# Patient Record
Sex: Male | Born: 1960 | Race: White | Hispanic: No | Marital: Married | Smoking: Never smoker
Health system: Southern US, Community
[De-identification: ages and names within clinical notes are randomized; demographics above are authoritative.]

## PROBLEM LIST (undated history)

## (undated) DIAGNOSIS — T8859XA Other complications of anesthesia, initial encounter: Secondary | ICD-10-CM

## (undated) DIAGNOSIS — G2581 Restless legs syndrome: Secondary | ICD-10-CM

## (undated) DIAGNOSIS — I10 Essential (primary) hypertension: Secondary | ICD-10-CM

## (undated) DIAGNOSIS — G4730 Sleep apnea, unspecified: Secondary | ICD-10-CM

## (undated) DIAGNOSIS — E785 Hyperlipidemia, unspecified: Secondary | ICD-10-CM

## (undated) DIAGNOSIS — N1330 Unspecified hydronephrosis: Secondary | ICD-10-CM

## (undated) DIAGNOSIS — K760 Fatty (change of) liver, not elsewhere classified: Secondary | ICD-10-CM

## (undated) DIAGNOSIS — K219 Gastro-esophageal reflux disease without esophagitis: Secondary | ICD-10-CM

## (undated) DIAGNOSIS — M1990 Unspecified osteoarthritis, unspecified site: Secondary | ICD-10-CM

## (undated) DIAGNOSIS — M549 Dorsalgia, unspecified: Secondary | ICD-10-CM

## (undated) DIAGNOSIS — N400 Enlarged prostate without lower urinary tract symptoms: Secondary | ICD-10-CM

## (undated) DIAGNOSIS — M2550 Pain in unspecified joint: Secondary | ICD-10-CM

## (undated) DIAGNOSIS — T7840XA Allergy, unspecified, initial encounter: Secondary | ICD-10-CM

## (undated) DIAGNOSIS — G4733 Obstructive sleep apnea (adult) (pediatric): Secondary | ICD-10-CM

## (undated) HISTORY — DX: Fatty (change of) liver, not elsewhere classified: K76.0

## (undated) HISTORY — DX: Allergy, unspecified, initial encounter: T78.40XA

## (undated) HISTORY — DX: Pain in unspecified joint: M25.50

## (undated) HISTORY — PX: JOINT REPLACEMENT: SHX530

## (undated) HISTORY — DX: Dorsalgia, unspecified: M54.9

---

## 2009-06-17 ENCOUNTER — Emergency Department (HOSPITAL_BASED_OUTPATIENT_CLINIC_OR_DEPARTMENT_OTHER): Admission: EM | Admit: 2009-06-17 | Discharge: 2009-06-17 | Payer: Self-pay | Admitting: Emergency Medicine

## 2010-04-28 HISTORY — PX: WISDOM TOOTH EXTRACTION: SHX21

## 2013-04-04 ENCOUNTER — Encounter: Payer: Self-pay | Admitting: Sports Medicine

## 2013-04-04 ENCOUNTER — Ambulatory Visit (INDEPENDENT_AMBULATORY_CARE_PROVIDER_SITE_OTHER): Payer: 59 | Admitting: Sports Medicine

## 2013-04-04 VITALS — BP 145/92 | HR 86 | Wt 230.0 lb

## 2013-04-04 DIAGNOSIS — M25561 Pain in right knee: Secondary | ICD-10-CM

## 2013-04-04 DIAGNOSIS — I10 Essential (primary) hypertension: Secondary | ICD-10-CM | POA: Insufficient documentation

## 2013-04-04 DIAGNOSIS — Z0000 Encounter for general adult medical examination without abnormal findings: Secondary | ICD-10-CM | POA: Insufficient documentation

## 2013-04-04 DIAGNOSIS — L918 Other hypertrophic disorders of the skin: Secondary | ICD-10-CM | POA: Insufficient documentation

## 2013-04-04 DIAGNOSIS — S83206A Unspecified tear of unspecified meniscus, current injury, right knee, initial encounter: Secondary | ICD-10-CM | POA: Insufficient documentation

## 2013-04-04 DIAGNOSIS — M5030 Other cervical disc degeneration, unspecified cervical region: Secondary | ICD-10-CM

## 2013-04-04 DIAGNOSIS — M1711 Unilateral primary osteoarthritis, right knee: Secondary | ICD-10-CM | POA: Insufficient documentation

## 2013-04-04 DIAGNOSIS — M51379 Other intervertebral disc degeneration, lumbosacral region without mention of lumbar back pain or lower extremity pain: Secondary | ICD-10-CM

## 2013-04-04 DIAGNOSIS — M5137 Other intervertebral disc degeneration, lumbosacral region: Secondary | ICD-10-CM

## 2013-04-04 DIAGNOSIS — M170 Bilateral primary osteoarthritis of knee: Secondary | ICD-10-CM | POA: Insufficient documentation

## 2013-04-04 DIAGNOSIS — M51369 Other intervertebral disc degeneration, lumbar region without mention of lumbar back pain or lower extremity pain: Secondary | ICD-10-CM | POA: Insufficient documentation

## 2013-04-04 DIAGNOSIS — E785 Hyperlipidemia, unspecified: Secondary | ICD-10-CM

## 2013-04-04 DIAGNOSIS — Z299 Encounter for prophylactic measures, unspecified: Secondary | ICD-10-CM

## 2013-04-04 DIAGNOSIS — M25569 Pain in unspecified knee: Secondary | ICD-10-CM

## 2013-04-04 DIAGNOSIS — M5136 Other intervertebral disc degeneration, lumbar region: Secondary | ICD-10-CM | POA: Insufficient documentation

## 2013-04-04 DIAGNOSIS — R030 Elevated blood-pressure reading, without diagnosis of hypertension: Secondary | ICD-10-CM

## 2013-04-04 DIAGNOSIS — L909 Atrophic disorder of skin, unspecified: Secondary | ICD-10-CM

## 2013-04-04 NOTE — Progress Notes (Signed)
  Subjective:    CC: Establish care.   HPI:  This is a 52 year old male who comes in with multiple complaints, he is transferring care from [facility].  Neck pain: Persistent, has been seeing a chiropractor, pain is been present for years, radiates from the neck down both arms in a C7 versus C8 distribution. He has had x-rays which showed degenerative disc disease in the past, but has never had an MRI. He has had some block scan which was moderately effective. Pain is moderate, persistent.  Low back pain: Amenable to discussing this in further detail at a future visit.  Hyperlipidemia: Tells me his lipids have been elevated in the past but he does not know the exact numbers, he is having records transferred.  Elevated blood pressure: Has probably been elevated on multiple occasions in the past but he is in pain right now.  Right knee pain: Worse when going up and down stairs, pain is at the joint lines as well as under the kneecap, moderate, persistent.  Skin tag: Located in the perineum, desires to have this excised.  Preventative measure: Colonoscopy 2 years ago, multiple polyps were removed, the pathology on these is unknown, he was told to repeat his colonoscopy in 2 years desires to go to a new gastroenterologist. Tetanus shot was 8 years ago, flu shot last month.  Past medical history, Surgical history, Family history not pertinant except as noted below, Social history, Allergies, and medications have been entered into the medical record, reviewed, and no changes needed.   Review of Systems: No headache, visual changes, nausea, vomiting, diarrhea, constipation, dizziness, abdominal pain, skin rash, fevers, chills, night sweats, swollen lymph nodes, weight loss, chest pain, body aches, joint swelling, muscle aches, shortness of breath, mood changes, visual or auditory hallucinations.  Objective:    General: Well Developed, well nourished, and in no acute distress.  Neuro:  Alert and oriented x3, extra-ocular muscles intact, sensation grossly intact.  HEENT: Normocephalic, atraumatic, pupils equal round reactive to light, neck supple, no masses, no lymphadenopathy, thyroid nonpalpable.  Skin: Warm and dry, no rashes noted.  Cardiac: Regular rate and rhythm, no murmurs rubs or gallops.  Respiratory: Clear to auscultation bilaterally. Not using accessory muscles, speaking in full sentences.  Abdominal: Soft, nontender, nondistended, positive bowel sounds, no masses, no organomegaly.  Neck: Inspection unremarkable. No palpable stepoffs. Negative Spurling's maneuver. Full neck range of motion Grip strength and sensation normal in bilateral hands Strength good C4 to T1 distribution No sensory change to C4 to T1 Negative Hoffman sign bilaterally Reflexes normal Right Knee: Normal to inspection with no erythema or effusion or obvious bony abnormalities. Tender to palpation along the medial joint line. All ligaments are stable including ACL, PCL, LCL, MCL. Negative Mcmurray's, Apley's, and Thessalonian tests. Non painful patellar compression. Patellar glide without crepitus. Patellar and quadriceps tendons unremarkable. Hamstring and quadriceps strength is normal.   Impression and Recommendations:    The patient was counselled, risk factors were discussed, anticipatory guidance given.

## 2013-04-04 NOTE — Assessment & Plan Note (Signed)
We will address this further at a future visit.

## 2013-04-04 NOTE — Assessment & Plan Note (Signed)
Obtaining records including blood work, it sounds as though he did have some hepatic steatosis.

## 2013-04-04 NOTE — Assessment & Plan Note (Signed)
Likely represents osteoarthritis. We will obtain records with x-rays. Continue Mobic, home rehabilitation given.

## 2013-04-04 NOTE — Assessment & Plan Note (Addendum)
Due for colonoscopy, he did have some polyps. Tetanus shot 8 years ago, flu shot one month ago. Tells me he had routine blood work done recently, and we'll try to get the lab results for me. He also has some fatigue, we can consider checking his testosterone levels in the near future.

## 2013-04-04 NOTE — Assessment & Plan Note (Signed)
Schedule appointment for excision.

## 2013-04-04 NOTE — Assessment & Plan Note (Signed)
In pain, we will follow this.

## 2013-04-04 NOTE — Assessment & Plan Note (Signed)
Obtaining records including x-rays which he has had already. Symptoms are suggestive of cervical spinal stenosis. Continue Mobic, home rehabilitation, obtaining an MRI. He does desire to continue with conservative measures, I do think interventions will be in the future.

## 2013-04-05 ENCOUNTER — Telehealth: Payer: Self-pay | Admitting: *Deleted

## 2013-04-05 NOTE — Telephone Encounter (Signed)
PA obtained for MRI Cervical Spine w/o contrast. Auth # [id number].  Meyer Cory, LPN

## 2013-04-09 ENCOUNTER — Ambulatory Visit (HOSPITAL_BASED_OUTPATIENT_CLINIC_OR_DEPARTMENT_OTHER)
Admission: RE | Admit: 2013-04-09 | Discharge: 2013-04-09 | Disposition: A | Discharge status: Home / Self Care | Payer: 59 | Source: Ambulatory Visit | Attending: Sports Medicine | Admitting: Sports Medicine

## 2013-04-09 DIAGNOSIS — M47812 Spondylosis without myelopathy or radiculopathy, cervical region: Secondary | ICD-10-CM | POA: Insufficient documentation

## 2013-04-09 DIAGNOSIS — M5020 Other cervical disc displacement, unspecified cervical region: Secondary | ICD-10-CM | POA: Insufficient documentation

## 2013-04-09 DIAGNOSIS — M4802 Spinal stenosis, cervical region: Secondary | ICD-10-CM | POA: Insufficient documentation

## 2013-04-09 DIAGNOSIS — R209 Unspecified disturbances of skin sensation: Secondary | ICD-10-CM | POA: Insufficient documentation

## 2013-04-09 DIAGNOSIS — M542 Cervicalgia: Secondary | ICD-10-CM | POA: Insufficient documentation

## 2013-04-09 DIAGNOSIS — M5030 Other cervical disc degeneration, unspecified cervical region: Secondary | ICD-10-CM

## 2013-04-09 DIAGNOSIS — G8929 Other chronic pain: Secondary | ICD-10-CM | POA: Insufficient documentation

## 2013-04-11 ENCOUNTER — Encounter: Payer: Self-pay | Admitting: Sports Medicine

## 2013-04-11 ENCOUNTER — Ambulatory Visit (INDEPENDENT_AMBULATORY_CARE_PROVIDER_SITE_OTHER): Payer: 59 | Admitting: Sports Medicine

## 2013-04-11 VITALS — BP 150/89 | HR 77 | Wt 231.0 lb

## 2013-04-11 DIAGNOSIS — L909 Atrophic disorder of skin, unspecified: Secondary | ICD-10-CM

## 2013-04-11 DIAGNOSIS — L918 Other hypertrophic disorders of the skin: Secondary | ICD-10-CM

## 2013-04-11 NOTE — Assessment & Plan Note (Signed)
8 skin tags removed from the perineum, as well as both axillae. Return as needed for this.

## 2013-04-11 NOTE — Progress Notes (Signed)
  Procedure: Removal of 8 skin tags.  Risks, benefits, and alternatives explained and consent obtained. Time out conducted. Surface prepped in a sterile fashion. 1cc lidocaine with epinephine infiltrated under skin tag Adequate anesthesia ensured. Skin tag excised from skin surface. This procedure was repeated for all 8 skin tags. Dressing applied. Pt stable. Pt advised to call or RTC for continued bleeding, spreading erythema/induration, fevers, or chills.

## 2013-05-02 ENCOUNTER — Ambulatory Visit (INDEPENDENT_AMBULATORY_CARE_PROVIDER_SITE_OTHER): Payer: 59 | Admitting: Sports Medicine

## 2013-05-02 ENCOUNTER — Encounter: Payer: Self-pay | Admitting: Sports Medicine

## 2013-05-02 VITALS — BP 138/84 | HR 92 | Wt 234.0 lb

## 2013-05-02 DIAGNOSIS — M51379 Other intervertebral disc degeneration, lumbosacral region without mention of lumbar back pain or lower extremity pain: Secondary | ICD-10-CM

## 2013-05-02 DIAGNOSIS — R030 Elevated blood-pressure reading, without diagnosis of hypertension: Secondary | ICD-10-CM

## 2013-05-02 DIAGNOSIS — L919 Hypertrophic disorder of the skin, unspecified: Secondary | ICD-10-CM

## 2013-05-02 DIAGNOSIS — M25569 Pain in unspecified knee: Secondary | ICD-10-CM

## 2013-05-02 DIAGNOSIS — M5136 Other intervertebral disc degeneration, lumbar region: Secondary | ICD-10-CM

## 2013-05-02 DIAGNOSIS — M51369 Other intervertebral disc degeneration, lumbar region without mention of lumbar back pain or lower extremity pain: Secondary | ICD-10-CM

## 2013-05-02 DIAGNOSIS — E669 Obesity, unspecified: Secondary | ICD-10-CM | POA: Insufficient documentation

## 2013-05-02 DIAGNOSIS — M5137 Other intervertebral disc degeneration, lumbosacral region: Secondary | ICD-10-CM

## 2013-05-02 DIAGNOSIS — L918 Other hypertrophic disorders of the skin: Secondary | ICD-10-CM

## 2013-05-02 DIAGNOSIS — L909 Atrophic disorder of skin, unspecified: Secondary | ICD-10-CM

## 2013-05-02 DIAGNOSIS — M25561 Pain in right knee: Secondary | ICD-10-CM

## 2013-05-02 DIAGNOSIS — M5030 Other cervical disc degeneration, unspecified cervical region: Secondary | ICD-10-CM

## 2013-05-02 MED ORDER — PHENTERMINE HCL 37.5 MG PO CAPS: 37.5000 mg | ORAL_CAPSULE | ORAL | Status: DC

## 2013-05-02 NOTE — Assessment & Plan Note (Signed)
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

## 2013-05-02 NOTE — Assessment & Plan Note (Signed)
Starting phentermine, nutritionist. Return monthly for weight checks.

## 2013-05-02 NOTE — Progress Notes (Signed)
  Subjective:    CC: Followup  HPI: Skin tags: Surgical sites are doing very well. He has some more tags on his neck which he is not yet ready to have removed.  Knee pain: Likely due to chondromalacia, feeling okay, more good days than bad days, he does desire to try to lose weight before pursuing any further intervention.  Cervical degenerative disc disease: MRI shows multilevel degenerative changes worse at the C5-6 and C6-C7 levels. Again, he desires to pursue weight loss before any further treatment here.  Lumbar degenerative disc disease: Same as above, desires to pursue weight loss before further intervention.  Past medical history, Surgical history, Family history not pertinant except as noted below, Social history, Allergies, and medications have been entered into the medical record, reviewed, and no changes needed.   Review of Systems: No fevers, chills, night sweats, weight loss, chest pain, or shortness of breath.   Objective:    General: Well Developed, well nourished, and in no acute distress.  Neuro: Alert and oriented x3, extra-ocular muscles intact, sensation grossly intact.  HEENT: Normocephalic, atraumatic, pupils equal round reactive to light, neck supple, no masses, no lymphadenopathy, thyroid nonpalpable.  Skin: Warm and dry, no rashes. Cardiac: Regular rate and rhythm, no murmurs rubs or gallops, no lower extremity edema.  Respiratory: Clear to auscultation bilaterally. Not using accessory muscles, speaking in full sentences.  Impression and Recommendations:

## 2013-05-02 NOTE — Assessment & Plan Note (Signed)
Extremely reasonable to try weight loss first prior to further treatment. 

## 2013-05-02 NOTE — Assessment & Plan Note (Signed)
Surgical sites are doing very well.

## 2013-05-02 NOTE — Assessment & Plan Note (Signed)
Improved, we will keep an eye on this.

## 2013-05-02 NOTE — Assessment & Plan Note (Signed)
Extremely reasonable to try weight loss first prior to further treatment.

## 2013-05-09 ENCOUNTER — Ambulatory Visit: Payer: Self-pay | Admitting: Sports Medicine

## 2013-05-30 ENCOUNTER — Encounter: Payer: Self-pay | Admitting: Sports Medicine

## 2013-05-30 ENCOUNTER — Ambulatory Visit (INDEPENDENT_AMBULATORY_CARE_PROVIDER_SITE_OTHER): Payer: 59 | Admitting: Sports Medicine

## 2013-05-30 VITALS — BP 117/63 | HR 88 | Wt 225.0 lb

## 2013-05-30 DIAGNOSIS — E669 Obesity, unspecified: Secondary | ICD-10-CM

## 2013-05-30 MED ORDER — PHENTERMINE HCL 37.5 MG PO CAPS: 37.5000 mg | ORAL_CAPSULE | ORAL | Status: DC

## 2013-05-30 NOTE — Progress Notes (Signed)
  Subjective:    CC: Weight check  HPI: Obesity: Is currently using phentermine every other day, has lost 10 pounds in the last month, goal weight is 200 pounds.  Past medical history, Surgical history, Family history not pertinant except as noted below, Social history, Allergies, and medications have been entered into the medical record, reviewed, and no changes needed.   Review of Systems: No fevers, chills, night sweats, weight loss, chest pain, or shortness of breath.   Objective:    General: Well Developed, well nourished, and in no acute distress.  Neuro: Alert and oriented x3, extra-ocular muscles intact, sensation grossly intact.  HEENT: Normocephalic, atraumatic, pupils equal round reactive to light, neck supple, no masses, no lymphadenopathy, thyroid nonpalpable.  Skin: Warm and dry, no rashes. Cardiac: Regular rate and rhythm, no murmurs rubs or gallops, no lower extremity edema.  Respiratory: Clear to auscultation bilaterally. Not using accessory muscles, speaking in full sentences.  Impression and Recommendations:

## 2013-05-30 NOTE — Assessment & Plan Note (Signed)
10 pound weight loss since the last visit. He is using phentermine every other day. Refilling. Return in one month. goal weight is 200 pounds.

## 2013-06-27 ENCOUNTER — Ambulatory Visit (INDEPENDENT_AMBULATORY_CARE_PROVIDER_SITE_OTHER): Payer: 59 | Admitting: Sports Medicine

## 2013-06-27 ENCOUNTER — Encounter: Payer: Self-pay | Admitting: Sports Medicine

## 2013-06-27 VITALS — BP 145/90 | HR 89 | Ht 67.5 in | Wt 218.0 lb

## 2013-06-27 DIAGNOSIS — E669 Obesity, unspecified: Secondary | ICD-10-CM

## 2013-06-27 MED ORDER — PHENTERMINE HCL 37.5 MG PO CAPS: 37.5000 mg | ORAL_CAPSULE | ORAL | Status: DC

## 2013-06-27 NOTE — Progress Notes (Signed)
  Subjective:    CC: Weight check  HPI: Obesity: Continues to lose weight with phentermine, he is now using it almost daily. No adverse effects.  Past medical history, Surgical history, Family history not pertinant except as noted below, Social history, Allergies, and medications have been entered into the medical record, reviewed, and no changes needed.   Review of Systems: No fevers, chills, night sweats, weight loss, chest pain, or shortness of breath.   Objective:    General: Well Developed, well nourished, and in no acute distress.  Neuro: Alert and oriented x3, extra-ocular muscles intact, sensation grossly intact.  HEENT: Normocephalic, atraumatic, pupils equal round reactive to light, neck supple, no masses, no lymphadenopathy, thyroid nonpalpable.  Skin: Warm and dry, no rashes. Cardiac: Regular rate and rhythm, no murmurs rubs or gallops, no lower extremity edema.  Respiratory: Clear to auscultation bilaterally. Not using accessory muscles, speaking in full sentences.  Impression and Recommendations:

## 2013-06-27 NOTE — Assessment & Plan Note (Signed)
Additional weight loss. Refilling phentermine. Return in 1 month.

## 2013-07-26 ENCOUNTER — Ambulatory Visit (INDEPENDENT_AMBULATORY_CARE_PROVIDER_SITE_OTHER): Payer: 59 | Admitting: Sports Medicine

## 2013-07-26 ENCOUNTER — Encounter: Payer: Self-pay | Admitting: Sports Medicine

## 2013-07-26 VITALS — BP 114/63 | HR 77 | Wt 210.0 lb

## 2013-07-26 DIAGNOSIS — E669 Obesity, unspecified: Secondary | ICD-10-CM

## 2013-07-26 MED ORDER — PHENTERMINE HCL 37.5 MG PO CAPS: 37.5000 mg | ORAL_CAPSULE | ORAL | Status: DC

## 2013-07-26 NOTE — Progress Notes (Signed)
  Subjective:    CC: Weight check  HPI: Obesity continued excellent weight loss with phentermine, no side effects.  Past medical history, Surgical history, Family history not pertinant except as noted below, Social history, Allergies, and medications have been entered into the medical record, reviewed, and no changes needed.   Review of Systems: No fevers, chills, night sweats, weight loss, chest pain, or shortness of breath.   Objective:    General: Well Developed, well nourished, and in no acute distress.  Neuro: Alert and oriented x3, extra-ocular muscles intact, sensation grossly intact.  HEENT: Normocephalic, atraumatic, pupils equal round reactive to light, neck supple, no masses, no lymphadenopathy, thyroid nonpalpable.  Skin: Warm and dry, no rashes. Cardiac: Regular rate and rhythm, no murmurs rubs or gallops, no lower extremity edema.  Respiratory: Clear to auscultation bilaterally. Not using accessory muscles, speaking in full sentences.  Impression and Recommendations:

## 2013-07-26 NOTE — Assessment & Plan Note (Signed)
Excellent weight loss so far of nearly 30 pounds. Continue phentermine. We will do 3 months this time. Return to see me in 3 months.

## 2013-07-27 ENCOUNTER — Encounter: Payer: Self-pay | Admitting: Sports Medicine

## 2013-07-28 ENCOUNTER — Other Ambulatory Visit: Payer: Self-pay

## 2013-08-01 ENCOUNTER — Encounter: Payer: Self-pay | Admitting: Sports Medicine

## 2013-08-05 ENCOUNTER — Encounter: Payer: Self-pay | Admitting: Sports Medicine

## 2013-08-10 ENCOUNTER — Other Ambulatory Visit: Payer: Self-pay | Admitting: Sports Medicine

## 2013-09-26 ENCOUNTER — Encounter: Payer: Self-pay | Admitting: Sports Medicine

## 2013-09-26 DIAGNOSIS — Z299 Encounter for prophylactic measures, unspecified: Secondary | ICD-10-CM

## 2013-09-26 DIAGNOSIS — D72819 Decreased white blood cell count, unspecified: Secondary | ICD-10-CM

## 2013-10-18 ENCOUNTER — Encounter: Payer: Self-pay | Admitting: Sports Medicine

## 2013-10-18 LAB — COMPREHENSIVE METABOLIC PANEL
ALT: 18 U/L (ref 0–53)
AST: 18 U/L (ref 0–37)
Albumin: 4.4 g/dL (ref 3.5–5.2)
Alkaline Phosphatase: 55 U/L (ref 39–117)
BUN: 19 mg/dL (ref 6–23)
CO2: 27 mEq/L (ref 19–32)
Calcium: 9.3 mg/dL (ref 8.4–10.5)
Chloride: 103 mEq/L (ref 96–112)
Creat: 0.96 mg/dL (ref 0.50–1.35)
Glucose, Bld: 95 mg/dL (ref 70–99)
Potassium: 4.6 mEq/L (ref 3.5–5.3)
Sodium: 139 mEq/L (ref 135–145)
Total Bilirubin: 1.5 mg/dL — ABNORMAL HIGH (ref 0.2–1.2)
Total Protein: 6.9 g/dL (ref 6.0–8.3)

## 2013-10-18 LAB — CBC
HCT: 42.1 % (ref 39.0–52.0)
Hemoglobin: 14.7 g/dL (ref 13.0–17.0)
MCH: 30.6 pg (ref 26.0–34.0)
MCHC: 34.9 g/dL (ref 30.0–36.0)
MCV: 87.7 fL (ref 78.0–100.0)
Platelets: 194 10*3/uL (ref 150–400)
RBC: 4.8 MIL/uL (ref 4.22–5.81)
RDW: 13.5 % (ref 11.5–15.5)
WBC: 3.5 10*3/uL — ABNORMAL LOW (ref 4.0–10.5)

## 2013-10-18 LAB — LIPID PANEL
Cholesterol: 166 mg/dL (ref 0–200)
HDL: 41 mg/dL (ref >39)
LDL Cholesterol: 106 mg/dL — ABNORMAL HIGH (ref 0–99)
Total CHOL/HDL Ratio: 4 Ratio
Triglycerides: 97 mg/dL (ref <150)
VLDL: 19 mg/dL (ref 0–40)

## 2013-10-18 LAB — HEMOGLOBIN A1C
Hgb A1c MFr Bld: 5.5 % (ref <5.7)
Mean Plasma Glucose: 111 mg/dL (ref <117)

## 2013-10-19 ENCOUNTER — Encounter: Payer: Self-pay | Admitting: Sports Medicine

## 2013-10-19 DIAGNOSIS — D72819 Decreased white blood cell count, unspecified: Secondary | ICD-10-CM | POA: Insufficient documentation

## 2013-10-19 LAB — TSH: TSH: 1.726 u[IU]/mL (ref 0.350–4.500)

## 2013-10-24 ENCOUNTER — Other Ambulatory Visit: Payer: Self-pay | Admitting: *Deleted

## 2013-10-24 ENCOUNTER — Ambulatory Visit: Payer: 59 | Admitting: Sports Medicine

## 2013-10-24 MED ORDER — OMEPRAZOLE 40 MG PO CPDR: 40.0000 mg | DELAYED_RELEASE_CAPSULE | Freq: Every day | ORAL | Status: DC

## 2013-11-03 ENCOUNTER — Encounter: Payer: Self-pay | Admitting: Sports Medicine

## 2013-11-03 ENCOUNTER — Ambulatory Visit (INDEPENDENT_AMBULATORY_CARE_PROVIDER_SITE_OTHER): Payer: 59 | Admitting: Sports Medicine

## 2013-11-03 VITALS — BP 125/72 | HR 77 | Ht 67.0 in | Wt 197.0 lb

## 2013-11-03 DIAGNOSIS — E669 Obesity, unspecified: Secondary | ICD-10-CM

## 2013-11-03 DIAGNOSIS — Z23 Encounter for immunization: Secondary | ICD-10-CM

## 2013-11-03 DIAGNOSIS — D72819 Decreased white blood cell count, unspecified: Secondary | ICD-10-CM

## 2013-11-03 LAB — CBC WITH DIFFERENTIAL/PLATELET
Basophils Absolute: 0 10*3/uL (ref 0.0–0.1)
Basophils Relative: 1 % (ref 0–1)
Eosinophils Absolute: 0.1 10*3/uL (ref 0.0–0.7)
Eosinophils Relative: 3 % (ref 0–5)
HCT: 46.9 % (ref 39.0–52.0)
Hemoglobin: 16.7 g/dL (ref 13.0–17.0)
Lymphocytes Relative: 42 % (ref 12–46)
Lymphs Abs: 2 10*3/uL (ref 0.7–4.0)
MCH: 30.8 pg (ref 26.0–34.0)
MCHC: 35.6 g/dL (ref 30.0–36.0)
MCV: 86.5 fL (ref 78.0–100.0)
Monocytes Absolute: 0.5 10*3/uL (ref 0.1–1.0)
Monocytes Relative: 11 % (ref 3–12)
Neutro Abs: 2 10*3/uL (ref 1.7–7.7)
Neutrophils Relative %: 43 % (ref 43–77)
Platelets: 219 10*3/uL (ref 150–400)
RBC: 5.42 MIL/uL (ref 4.22–5.81)
RDW: 13.7 % (ref 11.5–15.5)
WBC: 4.7 10*3/uL (ref 4.0–10.5)

## 2013-11-03 NOTE — Progress Notes (Signed)
  Subjective:    CC: Followup  HPI: Obesity: Brandon RuizJohn continues to lose weight, he's now lost approximately 43 pounds since starting phentermine, for the past several weeks he hasn't discontinued it himself and continued to maintain his weight.  Leukopenia: Noted incidentally, due for recheck of CBC.  Past medical history, Surgical history, Family history not pertinant except as noted below, Social history, Allergies, and medications have been entered into the medical record, reviewed, and no changes needed.   Review of Systems: No fevers, chills, night sweats, weight loss, chest pain, or shortness of breath.   Objective:    General: Well Developed, well nourished, and in no acute distress.  Neuro: Alert and oriented x3, extra-ocular muscles intact, sensation grossly intact.  HEENT: Normocephalic, atraumatic, pupils equal round reactive to light, neck supple, no masses, no lymphadenopathy, thyroid nonpalpable.  Skin: Warm and dry, no rashes. Cardiac: Regular rate and rhythm, no murmurs rubs or gallops, no lower extremity edema.  Respiratory: Clear to auscultation bilaterally. Not using accessory muscles, speaking in full sentences.  Impression and Recommendations:

## 2013-11-03 NOTE — Assessment & Plan Note (Signed)
White blood cell count was a little bit low at the last check. We will recheck it today.

## 2013-11-03 NOTE — Assessment & Plan Note (Addendum)
Brandon RuizJohn continues to do extremely well, he has now lost approximately 43 pounds on phentermine over 6 months. In fact for the past several weeks he has been off of phentermine. He's not desire to continue. If he changes his mind we can do a half tab daily for long-term use.

## 2013-11-04 ENCOUNTER — Encounter: Payer: Self-pay | Admitting: Sports Medicine

## 2013-11-22 ENCOUNTER — Encounter: Payer: Self-pay | Admitting: Emergency Medicine

## 2013-11-22 ENCOUNTER — Emergency Department (INDEPENDENT_AMBULATORY_CARE_PROVIDER_SITE_OTHER)
Admission: EM | Admit: 2013-11-22 | Discharge: 2013-11-22 | Disposition: A | Discharge status: Home / Self Care | Payer: 59 | Source: Home / Self Care | Attending: Emergency Medicine | Admitting: Emergency Medicine

## 2013-11-22 DIAGNOSIS — J069 Acute upper respiratory infection, unspecified: Secondary | ICD-10-CM

## 2013-11-22 LAB — POCT INFLUENZA A/B
Influenza A, POC: POSITIVE
Influenza B, POC: NEGATIVE

## 2013-11-22 MED ORDER — OSELTAMIVIR PHOSPHATE 75 MG PO CAPS: 75.0000 mg | ORAL_CAPSULE | Freq: Two times a day (BID) | ORAL | Status: DC

## 2013-11-22 MED ORDER — METHYLPREDNISOLONE SODIUM SUCC 125 MG IJ SOLR
125.0000 mg | Freq: Once | INTRAMUSCULAR | Status: AC
  Administered 2013-11-22: 125 mg via INTRAMUSCULAR

## 2013-11-22 MED ORDER — PREDNISONE (PAK) 10 MG PO TABS: ORAL_TABLET | Freq: Every day | ORAL | Status: DC

## 2013-11-22 MED ORDER — AMOXICILLIN 875 MG PO TABS: 875.0000 mg | ORAL_TABLET | Freq: Two times a day (BID) | ORAL | Status: DC

## 2013-11-22 NOTE — ED Notes (Signed)
Reports onset of body aches, headache, dizziness last evening and broke out in sweats during night.

## 2013-11-22 NOTE — ED Notes (Signed)
Took ibuprofen 800 mg po at 5:30a.m. today.

## 2013-11-22 NOTE — ED Provider Notes (Signed)
CSN: [id number]     Arrival date & time 11/22/13  [id number] History   First MD Initiated Contact with Patient 11/22/13 [phone]     Chief Complaint  Patient presents with  . Generalized Body Aches  . Dizziness  . Night Sweats   (Consider location/radiation/quality/duration/timing/severity/associated sxs/prior Treatment) HPI Brandon Riley is a 53 y.o. male who complains of onset of cold symptoms since last night.  The symptoms are constant and mild-moderate in severity.  He has taken ibuprofen that helped.  He has also taken an allergy pill which helped.  Possible sick contacts but nothing definite.  He was working outside all day yesterday in the heat and felt a little tired afterwards.  No chest pain or shortness of breath or swelling. No sore throat No cough No pleuritic pain No wheezing + nasal congestion No post-nasal drainage + sinus pain/pressure No chest congestion No itchy/red eyes + R earache No hemoptysis No SOB + chills/sweats No fever +/- nausea & dizziness No vomiting No abdominal pain No diarrhea No skin rashes + fatigue + myalgias + headache     History reviewed. No pertinent past medical history. Past Surgical History  Procedure Laterality Date  . Wisdom tooth extraction  2012   History reviewed. No pertinent family history. History  Substance Use Topics  . Smoking status: Never Smoker   . Smokeless tobacco: Not on file  . Alcohol Use: Yes    Review of Systems  All other systems reviewed and are negative.   Allergies  Celebrex and Flexeril  Home Medications   Prior to Admission medications   Medication Sig Start Date End Date Taking? Authorizing Provider  amoxicillin (AMOXIL) 875 MG tablet Take 1 tablet (875 mg total) by mouth 2 (two) times daily. 11/22/13   Marlaine HindJeffrey H Henderson, MD  cholecalciferol (VITAMIN D) 1000 UNITS tablet Take 1,000 Units by mouth daily.    Historical Provider, MD  Iron Combinations (FE PLUS PROTEIN PO) Take by mouth.    Historical  Provider, MD  Magnesium-Potassium 250-100 MG TABS Take by mouth.    Historical Provider, MD  meloxicam (MOBIC) 15 MG tablet Take 15 mg by mouth daily.    Historical Provider, MD  omeprazole (PRILOSEC) 40 MG capsule Take 1 capsule (40 mg total) by mouth daily. 10/24/13   Monica Bectonhomas J Thekkekandam, MD  oseltamivir (TAMIFLU) 75 MG capsule Take 1 capsule (75 mg total) by mouth 2 (two) times daily. 11/22/13   Marlaine HindJeffrey H Henderson, MD  predniSONE (STERAPRED UNI-PAK) 10 MG tablet Take by mouth daily. 6 day pack, use as directed 11/22/13   Marlaine HindJeffrey H Henderson, MD   BP 109/73  Pulse 82  Temp(Src) 98.4 F (36.9 C) (Oral)  Resp 16  Ht 5\' 8"  (1.727 m)  Wt 198 lb (89.812 kg)  BMI 30.11 kg/m2  SpO2 97% Physical Exam  Nursing note and vitals reviewed. Constitutional: He is oriented to person, place, and time. Vital signs are normal. He appears well-developed and well-nourished.  Non-toxic appearance. He does not appear ill.  HENT:  Head: Normocephalic and atraumatic.  Right Ear: Tympanic membrane, external ear and ear canal normal.  Left Ear: Tympanic membrane, external ear and ear canal normal.  Nose: Mucosal edema present.  Mouth/Throat: No oropharyngeal exudate, posterior oropharyngeal edema or posterior oropharyngeal erythema.  Eyes: No scleral icterus.  Neck: Neck supple. No muscular tenderness present. No rigidity. Normal range of motion present.  Cardiovascular: Normal rate, regular rhythm and normal heart sounds.   Pulmonary/Chest: Effort normal and breath  sounds normal. No respiratory distress. He has no decreased breath sounds. He has no wheezes. He has no rhonchi.  Neurological: He is alert and oriented to person, place, and time. He is not disoriented. Gait normal. GCS eye subscore is 4. GCS verbal subscore is 5. GCS motor subscore is 6.  Skin: Skin is warm and dry.  Psychiatric: He has a normal mood and affect. His speech is normal.    ED Course  Procedures (including critical care  time) Labs Review Labs Reviewed  POCT INFLUENZA A/B    Imaging Review No results found.   MDM   1. Acute upper respiratory infections of unspecified site    1)  intramuscular injection of Solu-Medrol 125 was given to the patient.  IAlso Rx for Prednisone for symptoms.  Flu test was done and is positive.  Rx for Tamiflu. 2)  Use nasal saline solution (over the counter) at least 3 times a day. 3)  Use over the counter decongestants like Zyrtec-D every 12 hours as needed to help with congestion.  If you have hypertension, do not take medicines with sudafed.  4)  Can take tylenol every 6 hours or motrin every 8 hours for pain or fever. 5)  Follow up with your primary doctor if no improvement in 5-7 days, sooner if increasing pain, fever, or new symptoms.     Marlaine Hind, MD 11/22/13 1018

## 2014-01-24 ENCOUNTER — Other Ambulatory Visit: Payer: Self-pay | Admitting: Sports Medicine

## 2014-01-30 ENCOUNTER — Ambulatory Visit (INDEPENDENT_AMBULATORY_CARE_PROVIDER_SITE_OTHER): Payer: 59 | Admitting: Sports Medicine

## 2014-01-30 ENCOUNTER — Encounter: Payer: Self-pay | Admitting: Sports Medicine

## 2014-01-30 VITALS — BP 135/83 | HR 71 | Ht 67.0 in | Wt 199.0 lb

## 2014-01-30 DIAGNOSIS — Z129 Encounter for screening for malignant neoplasm, site unspecified: Secondary | ICD-10-CM

## 2014-01-30 DIAGNOSIS — E669 Obesity, unspecified: Secondary | ICD-10-CM

## 2014-01-30 DIAGNOSIS — Z0000 Encounter for general adult medical examination without abnormal findings: Secondary | ICD-10-CM

## 2014-01-30 DIAGNOSIS — Z23 Encounter for immunization: Secondary | ICD-10-CM

## 2014-01-30 DIAGNOSIS — Z1211 Encounter for screening for malignant neoplasm of colon: Secondary | ICD-10-CM

## 2014-01-30 DIAGNOSIS — E785 Hyperlipidemia, unspecified: Secondary | ICD-10-CM

## 2014-01-30 LAB — HEMOCCULT GUIAC POC 1CARD (OFFICE): Fecal Occult Blood, POC: NEGATIVE

## 2014-01-30 NOTE — Assessment & Plan Note (Signed)
Good weight loss with phentermine, has been able to maintain weight very well off of it for several months now.

## 2014-01-30 NOTE — Patient Instructions (Signed)
Shingles Vaccine What You Need to Know WHAT IS SHINGLES?  Shingles is a painful skin rash, often with blisters. It is also called Herpes Zoster or just Zoster.  A shingles rash usually appears on one side of the face or body and lasts from 2 to 4 weeks. Its main symptom is pain, which can be quite severe. Other symptoms of shingles can include fever, headache, chills, and upset stomach. Very rarely, a shingles infection can lead to pneumonia, hearing problems, blindness, brain inflammation (encephalitis), or death.  For about 1 person in 5, severe pain can continue even after the rash clears up. This is called post-herpetic neuralgia.  Shingles is caused by the Varicella Zoster virus. This is the same virus that causes chickenpox. Only someone who has had a case of chickenpox or rarely, has gotten chickenpox vaccine, can get shingles. The virus stays in your body. It can reappear many years later to cause a case of shingles.  You cannot catch shingles from another person with shingles. However, a person who has never had chickenpox (or chickenpox vaccine) could get chickenpox from someone with shingles. This is not very common.  Shingles is far more common in people 50 and older than in younger people. It is also more common in people whose immune systems are weakened because of a disease such as cancer or drugs such as steroids or chemotherapy.  At least 1 million people get shingles per year in the [place]. SHINGLES VACCINE  A vaccine for shingles was licensed in 2006. In clinical trials, the vaccine reduced the risk of shingles by 50%. It can also reduce the pain in people who still get shingles after being vaccinated.  A single dose of shingles vaccine is recommended for adults 60 years of age and older. SOME PEOPLE SHOULD NOT GET SHINGLES VACCINE OR SHOULD WAIT A person should not get shingles vaccine if he or she:  Has ever had a life-threatening allergic reaction to gelatin, the  antibiotic neomycin, or any other component of shingles vaccine. Tell your caregiver if you have any severe allergies.  Has a weakened immune system because of current:  AIDS or another disease that affects the immune system.  Treatment with drugs that affect the immune system, such as prolonged use of high-dose steroids.  Cancer treatment, such as radiation or chemotherapy.  Cancer affecting the bone marrow or lymphatic system, such as leukemia or lymphoma.  Is pregnant, or might be pregnant. Women should not become pregnant until at least 4 weeks after getting shingles vaccine. Someone with a minor illness, such as a cold, may be vaccinated. Anyone with a moderate or severe acute illness should usually wait until he or she recovers before getting the vaccine. This includes anyone with a temperature of 101.3 F (38 C) or higher. WHAT ARE THE RISKS FROM SHINGLES VACCINE?  A vaccine, like any medicine, could possibly cause serious problems, such as severe allergic reactions. However, the risk of a vaccine causing serious harm, or death, is extremely small.  No serious problems have been identified with shingles vaccine. Mild Problems  Redness, soreness, swelling, or itching at the site of the injection (about 1 person in 3).  Headache (about 1 person in 70). Like all vaccines, shingles vaccine is being closely monitored for unusual or severe problems. WHAT IF THERE IS A MODERATE OR SEVERE REACTION? What should I look for? Any unusual condition, such as a severe allergic reaction or a high fever. If a severe allergic reaction   occurred, it would be within a few minutes to an hour after the shot. Signs of a serious allergic reaction can include difficulty breathing, weakness, hoarseness or wheezing, a fast heartbeat, hives, dizziness, paleness, or swelling of the throat. What should I do?  Call your caregiver, or get the person to a caregiver right away.  Tell the caregiver what  happened, the date and time it happened, and when the vaccination was given.  Ask the caregiver to report the reaction by filing a Vaccine Adverse Event Reporting System (VAERS) form. Or, you can file this report through the VAERS web site at www.vaers.hhs.gov or by calling 1-800-822-7967. VAERS does not provide medical advice. HOW CAN I LEARN MORE?  Ask your caregiver. He or she can give you the vaccine package insert or suggest other sources of information.  Contact the Centers for Disease Control and Prevention (CDC):  Call 1-800-232-4636 (1-800-CDC-INFO).  Visit the CDC website at www.cdc.gov/vaccines CDC Shingles Vaccine VIS (02/01/08) Document Released: 02/09/2006 Document Revised: 07/07/2011 Document Reviewed: 08/04/2012 [employer] Patient Information 2015 [employer]. This information is not intended to replace advice given to you by your health care provider. Make sure you discuss any questions you have with your health care provider.  

## 2014-01-30 NOTE — Progress Notes (Signed)
  Subjective:    CC: Establish care.   HPI:  This is a very pleasant 53 year old male, comes in for a complete physical, no complaints, he does question whether he needs the shingles vaccine or not. He is also amenable to influenza vaccination.  Past medical history, Surgical history, Family history not pertinant except as noted below, Social history, Allergies, and medications have been entered into the medical record, reviewed, and no changes needed.   Review of Systems: No headache, visual changes, nausea, vomiting, diarrhea, constipation, dizziness, abdominal pain, skin rash, fevers, chills, night sweats, swollen lymph nodes, weight loss, chest pain, body aches, joint swelling, muscle aches, shortness of breath, mood changes, visual or auditory hallucinations.  Objective:    General: Well Developed, well nourished, and in no acute distress.  Neuro: Alert and oriented x3, extra-ocular muscles intact, sensation grossly intact. Cranial nerves II through XII are intact, motor, sensory, and coordinative functions are all intact. HEENT: Normocephalic, atraumatic, pupils equal round reactive to light, neck supple, no masses, no lymphadenopathy, thyroid nonpalpable. Oropharynx, nasopharynx, external ear canals are unremarkable. Skin: Warm and dry, no rashes noted.  Cardiac: Regular rate and rhythm, no murmurs rubs or gallops.  Respiratory: Clear to auscultation bilaterally. Not using accessory muscles, speaking in full sentences.  Abdominal: Soft, nontender, nondistended, positive bowel sounds, no masses, no organomegaly.  Musculoskeletal: Shoulder, elbow, wrist, hip, knee, ankle stable, and with full range of motion. Rectal: Good tone, smooth prostate, Hemoccult negative. Few hemorrhoids.  Impression and Recommendations:    The patient was counselled, risk factors were discussed, anticipatory guidance given.

## 2014-01-30 NOTE — Assessment & Plan Note (Signed)
Healthy male. No problems. Flu shot today, up to date on all screening measures. Return in one year or sooner if needed.

## 2014-01-30 NOTE — Assessment & Plan Note (Signed)
Stable and well controlled. 

## 2014-02-22 ENCOUNTER — Other Ambulatory Visit: Payer: Self-pay | Admitting: Sports Medicine

## 2014-03-05 ENCOUNTER — Encounter: Payer: Self-pay | Admitting: Sports Medicine

## 2014-03-06 ENCOUNTER — Other Ambulatory Visit: Payer: Self-pay

## 2014-03-06 MED ORDER — OMEPRAZOLE 40 MG PO CPDR: DELAYED_RELEASE_CAPSULE | ORAL | Status: DC

## 2014-03-06 NOTE — Telephone Encounter (Signed)
Patient request refill for Omeprazole. 30 11R has been sent to [facility]. Racquelle Hyser,CMA

## 2014-06-05 ENCOUNTER — Encounter: Payer: Self-pay | Admitting: Sports Medicine

## 2014-06-05 ENCOUNTER — Ambulatory Visit (INDEPENDENT_AMBULATORY_CARE_PROVIDER_SITE_OTHER): Payer: 59 | Admitting: Sports Medicine

## 2014-06-05 VITALS — BP 126/80 | HR 75 | Wt 214.0 lb

## 2014-06-05 DIAGNOSIS — G4733 Obstructive sleep apnea (adult) (pediatric): Secondary | ICD-10-CM | POA: Insufficient documentation

## 2014-06-05 NOTE — Progress Notes (Signed)
  Subjective:    CC:  Excessive drowsiness  HPI:  Brandon RuizJohn returns, he is doing well with the exception of persistent excessive drowsiness during the day, he tells me he does wear his CPAP at night, his wife tells him that he tosses and turns through the night. He is not waking up to void, and does not feel as though he wakes up through the night at all. Gets approximately 6-8 hours of sleep per night. He has not discussed the quality or control or desaturations with regards to his sleep apnea with [facility] in a while.  Past medical history, Surgical history, Family history not pertinant except as noted below, Social history, Allergies, and medications have been entered into the medical record, reviewed, and no changes needed.   Review of Systems: No fevers, chills, night sweats, weight loss, chest pain, or shortness of breath.   Objective:    General: Well Developed, well nourished, and in no acute distress.  Neuro: Alert and oriented x3, extra-ocular muscles intact, sensation grossly intact.  HEENT: Normocephalic, atraumatic, pupils equal round reactive to light, neck supple, no masses, no lymphadenopathy, thyroid nonpalpable.  Skin: Warm and dry, no rashes. Cardiac: Regular rate and rhythm, no murmurs rubs or gallops, no lower extremity edema.  Respiratory: Clear to auscultation bilaterally. Not using accessory muscles, speaking in full sentences.  Impression and Recommendations:

## 2014-06-05 NOTE — Assessment & Plan Note (Signed)
Continues to use CPAP machine through [facility]. Unfortunately is having persistent excessive daytime sleepiness. Wife complains of twitching through the night, this likely represents inappropriate CPAP settings versus periodic limb movement disorder. I would like [facility] to download his CPAP usage data, as well as do an ambulatory pulse oxygenation at night to ensure he is not desaturating. If persistent symptoms we will consider periodic limb movement disorder. Return in 2-3 weeks

## 2014-06-12 ENCOUNTER — Encounter: Payer: Self-pay | Admitting: Sports Medicine

## 2014-06-12 ENCOUNTER — Telehealth: Payer: Self-pay

## 2014-06-12 NOTE — Telephone Encounter (Signed)
Patient wanted med list updated. Rhonda Cunningham,CMA

## 2014-06-21 ENCOUNTER — Encounter: Payer: Self-pay | Admitting: Sports Medicine

## 2014-06-26 ENCOUNTER — Ambulatory Visit: Payer: 59 | Admitting: Sports Medicine

## 2014-07-04 ENCOUNTER — Encounter: Payer: Self-pay | Admitting: Sports Medicine

## 2014-07-04 ENCOUNTER — Ambulatory Visit (INDEPENDENT_AMBULATORY_CARE_PROVIDER_SITE_OTHER): Payer: 59 | Admitting: Sports Medicine

## 2014-07-04 DIAGNOSIS — G4733 Obstructive sleep apnea (adult) (pediatric): Secondary | ICD-10-CM

## 2014-07-04 MED ORDER — ZOLPIDEM TARTRATE 10 MG PO TABS: 10.0000 mg | ORAL_TABLET | Freq: Every evening | ORAL | Status: DC | PRN

## 2014-07-04 NOTE — Assessment & Plan Note (Signed)
I reviewed his CPAP download, he is extremely well controlled with an apnea hypotony index of only 1.2 at 12 cm of water, thus I do not think uncontrolled sleep apnea as the cause of his persistent daytime sleepiness. He did have evidence of periodic limb movement disorder, he also tells me he tends to awaken often during sleep. For this reason we are going to try Ambien, if insufficient response we will likely refer to sleep specialist for consideration of sleep EEG. Certainly Requip or Mirapex would be options considering the possibility of periodic limb movement disorder. We will try 2 weeks of Ambien, if he does well we can refill with a longer course.

## 2014-07-04 NOTE — Progress Notes (Signed)
  Subjective:    CC: Follow-up  HPI: Excessive daytime sleepiness: I did review the download, Brandon RuizJohn appears to have excellent compliance with his CPAP machine, with no desats and a very very low apnea hypopnea index. He does tell me he wakens often through the night. He does not have to void. His wife endorses periodic limb movements. Denies any symptoms of depression.  Past medical history, Surgical history, Family history not pertinant except as noted below, Social history, Allergies, and medications have been entered into the medical record, reviewed, and no changes needed.   Review of Systems: No fevers, chills, night sweats, weight loss, chest pain, or shortness of breath.   Objective:    General: Well Developed, well nourished, and in no acute distress.  Neuro: Alert and oriented x3, extra-ocular muscles intact, sensation grossly intact.  HEENT: Normocephalic, atraumatic, pupils equal round reactive to light, neck supple, no masses, no lymphadenopathy, thyroid nonpalpable.  Skin: Warm and dry, no rashes. Cardiac: Regular rate and rhythm, no murmurs rubs or gallops, no lower extremity edema.  Respiratory: Clear to auscultation bilaterally. Not using accessory muscles, speaking in full sentences.  Impression and Recommendations:    I spent 25 minutes with this patient, 50% was face-to-face time counseling regarding the diagnosis of excessive daytime sleepiness and describing the pathophysiology of very sleep disorders.

## 2014-07-06 ENCOUNTER — Other Ambulatory Visit: Payer: Self-pay | Admitting: Sports Medicine

## 2014-07-12 ENCOUNTER — Encounter: Payer: Self-pay | Admitting: Family Medicine

## 2014-07-12 ENCOUNTER — Ambulatory Visit (INDEPENDENT_AMBULATORY_CARE_PROVIDER_SITE_OTHER): Payer: 59 | Admitting: Family Medicine

## 2014-07-12 VITALS — BP 128/82 | HR 84 | Wt 218.0 lb

## 2014-07-12 DIAGNOSIS — L237 Allergic contact dermatitis due to plants, except food: Secondary | ICD-10-CM

## 2014-07-12 DIAGNOSIS — L03119 Cellulitis of unspecified part of limb: Secondary | ICD-10-CM

## 2014-07-12 MED ORDER — CEPHALEXIN 500 MG PO CAPS: 500.0000 mg | ORAL_CAPSULE | Freq: Three times a day (TID) | ORAL | Status: DC

## 2014-07-12 MED ORDER — PREDNISONE 20 MG PO TABS: ORAL_TABLET | ORAL | Status: AC

## 2014-07-12 NOTE — Progress Notes (Signed)
CC: Brandon Riley is a 54 y.o. male is here for Acute Visit   Subjective: HPI:   complains of redness, and itching that began yesterday localized to the forearms bilaterally. Seems to be worse in the flexor surface of the right elbow.  interventions have included Benadryl topical ointments which has helped with the itching. It seems to be spreading  But nowhere also on his body, just isolated to the foreams.  He tells me it feels like prior episodes of poison ivy however something different about the spot in his elbow he feels much more warm and has a burning component to it.  He knows that he was exposed to poison ivy or sumac over the weekend on Saturday. He denies any fevers, chills, shortness of breath, angioedema nor difficulty swallowing.   Review Of Systems Outlined In HPI  No past medical history on file.  Past Surgical History  Procedure Laterality Date  . Wisdom tooth extraction  2012   No family history on file.  History   Social History  . Marital Status: Married    Spouse Name: N/A  . Number of Children: N/A  . Years of Education: N/A   Occupational History  . Not on file.   Social History Main Topics  . Smoking status: Never Smoker   . Smokeless tobacco: Not on file  . Alcohol Use: Yes  . Drug Use: Not on file  . Sexual Activity: Not on file   Other Topics Concern  . Not on file   Social History Narrative     Objective: BP 128/82 mmHg  Pulse 84  Wt 218 lb (98.884 kg)  SpO2 98%  Vital signs reviewed. General: Alert and Oriented, No Acute Distress HEENT: Pupils equal, round, reactive to light. Conjunctivae clear.  External ears unremarkable.  Moist mucous membranes. Lungs: Clear and comfortable work of breathing, speaking in full sentences without accessory muscle use. Cardiac: Regular rate and rhythm.  Neuro: CN II-XII grossly intact, gait normal. Extremities: No peripheral edema.  Strong peripheral pulses.  Mental Status: No depression, anxiety, nor  agitation. Logical though process. Skin: Warm and dry. Erythematous clusters of vesicles in a linear pattern and streaks on both forearms with a large 4 cm diameter patch in the flexor surface of the elbow which is particularly warm to the touch but without induration norfluctuance.full range of motion and strength in the right elbow without pain.   Assessment & Plan: Brandon Riley was seen today for acute visit.  Diagnoses and all orders for this visit:  Poison ivy Orders: -     predniSONE (DELTASONE) 20 MG tablet; Three tabs daily days 1-3, two tabs daily days 4-6, one tab daily days 7-9, half tab daily days 10-13.  Cellulitis of upper extremity, unspecified laterality Orders: -     cephALEXin (KEFLEX) 500 MG capsule; Take 1 capsule (500 mg total) by mouth 3 (three) times daily.   He clearly has a allergic reaction to poison ivy or some similar plant based oil, will start prednisone and have also asked him to start on Keflex for suspicion of a strep infection localized to the skin of the right elbow.  Return if symptoms worsen or fail to improve.

## 2014-07-17 ENCOUNTER — Ambulatory Visit (INDEPENDENT_AMBULATORY_CARE_PROVIDER_SITE_OTHER): Payer: 59 | Admitting: Sports Medicine

## 2014-07-17 ENCOUNTER — Encounter: Payer: Self-pay | Admitting: Sports Medicine

## 2014-07-17 VITALS — BP 147/86 | HR 68 | Wt 219.0 lb

## 2014-07-17 DIAGNOSIS — E291 Testicular hypofunction: Secondary | ICD-10-CM | POA: Insufficient documentation

## 2014-07-17 DIAGNOSIS — G4733 Obstructive sleep apnea (adult) (pediatric): Secondary | ICD-10-CM

## 2014-07-17 DIAGNOSIS — G4719 Other hypersomnia: Secondary | ICD-10-CM | POA: Diagnosis not present

## 2014-07-17 MED ORDER — TRAZODONE HCL 50 MG PO TABS: 50.0000 mg | ORAL_TABLET | Freq: Every day | ORAL | Status: DC

## 2014-07-17 MED ORDER — CITALOPRAM HYDROBROMIDE 20 MG PO TABS: 20.0000 mg | ORAL_TABLET | Freq: Every day | ORAL | Status: DC

## 2014-07-17 NOTE — Assessment & Plan Note (Addendum)
Endorses poor sleep through the night. Ambien was ineffective. We are going to look at anxiety and depression but I would also like him to discuss this with a sleep specialist if no improvement on Celexa and trazodone. PHQ9 was 4, GAD7 was 11. Starting Celexa and trazodone at bedtime. Return in one month.

## 2014-07-17 NOTE — Assessment & Plan Note (Signed)
Very low AHI with excellent usage of CPAP on a recent download, AHI was only 1.2 at 12 cm of H2O.

## 2014-07-17 NOTE — Progress Notes (Signed)
  Subjective:    CC: Follow-up  HPI: Excessive daytime sleepiness: Brandon Riley has sleep apnea, he has fantastic compliance with his CPAP machine and an extremely low AHI based on a recent download, we tried Ambien at the last visit which tended to keep him awake. On further questioning today he does endorse mild nervousness, severe problems with sleep, severe worried, and mild irritability. He has very mild anhedonia, depressed mood, mild guilt, and mild psychomotor retardation. No suicidal or homicidal ideation.  Past medical history, Surgical history, Family history not pertinant except as noted below, Social history, Allergies, and medications have been entered into the medical record, reviewed, and no changes needed.   Review of Systems: No fevers, chills, night sweats, weight loss, chest pain, or shortness of breath.   Objective:    General: Well Developed, well nourished, and in no acute distress.  Neuro: Alert and oriented x3, extra-ocular muscles intact, sensation grossly intact.  HEENT: Normocephalic, atraumatic, pupils equal round reactive to light, neck supple, no masses, no lymphadenopathy, thyroid nonpalpable.  Skin: Warm and dry, no rashes. Cardiac: Regular rate and rhythm, no murmurs rubs or gallops, no lower extremity edema.  Respiratory: Clear to auscultation bilaterally. Not using accessory muscles, speaking in full sentences.  Impression and Recommendations:

## 2014-08-14 ENCOUNTER — Other Ambulatory Visit: Payer: Self-pay | Admitting: Sports Medicine

## 2014-08-21 ENCOUNTER — Ambulatory Visit (INDEPENDENT_AMBULATORY_CARE_PROVIDER_SITE_OTHER): Payer: 59 | Admitting: Sports Medicine

## 2014-08-21 ENCOUNTER — Encounter: Payer: Self-pay | Admitting: Sports Medicine

## 2014-08-21 VITALS — BP 125/74 | HR 72 | Temp 98.1°F | Resp 18 | Ht 67.0 in | Wt 219.0 lb

## 2014-08-21 DIAGNOSIS — G4719 Other hypersomnia: Secondary | ICD-10-CM

## 2014-08-21 NOTE — Assessment & Plan Note (Addendum)
Improved significantly with starting Celexa and trazodone at bedtime. He is going to decrease Celexa to every other day and trazodone to one half pill at bedtime. He does endorse a slight lack of drive, despite good sleep, also was trazodone at 50 mg has tended to make it a bit topical to wake up, I'm going to add testosterone levels.  No need for sleep referral at this point.

## 2014-08-21 NOTE — Progress Notes (Signed)
  Subjective:    CC: Follow-up  HPI: We have had a great deal of difficulty finding a good solution for Brandon Riley, ultimately we tried Celexa with trazodone at bedtime, he seems to do extremely well, he sleeps well almost to the point where he feels somewhat drowsy in the morning with the current dose of trazodone, his stress and anxiety has improved significantly as well with Celexa during the day. Happy with how things are going, he does desire to decrease Celexa to every other day, decreased to one half of the dose of trazodone. No suicidal or homicidal ideation.  Past medical history, Surgical history, Family history not pertinant except as noted below, Social history, Allergies, and medications have been entered into the medical record, reviewed, and no changes needed.   Review of Systems: No fevers, chills, night sweats, weight loss, chest pain, or shortness of breath.   Objective:    General: Well Developed, well nourished, and in no acute distress.  Neuro: Alert and oriented x3, extra-ocular muscles intact, sensation grossly intact.  HEENT: Normocephalic, atraumatic, pupils equal round reactive to light, neck supple, no masses, no lymphadenopathy, thyroid nonpalpable.  Skin: Warm and dry, no rashes. Cardiac: Regular rate and rhythm, no murmurs rubs or gallops, no lower extremity edema.  Respiratory: Clear to auscultation bilaterally. Not using accessory muscles, speaking in full sentences.  Impression and Recommendations:

## 2014-08-22 LAB — TESTOSTERONE, FREE, TOTAL, SHBG
Sex Hormone Binding: 34 nmol/L (ref 10–50)
Testosterone, Free: 74.5 pg/mL (ref 47.0–244.0)
Testosterone-% Free: 2 % (ref 1.6–2.9)
Testosterone: 376 ng/dL (ref 300–890)

## 2014-09-12 ENCOUNTER — Other Ambulatory Visit: Payer: Self-pay | Admitting: Sports Medicine

## 2014-09-12 NOTE — Telephone Encounter (Signed)
Refills denied, last filled with refills on 08/21/14 at the same pharmacy.

## 2014-10-23 ENCOUNTER — Ambulatory Visit (INDEPENDENT_AMBULATORY_CARE_PROVIDER_SITE_OTHER): Payer: 59 | Admitting: Sports Medicine

## 2014-10-23 ENCOUNTER — Encounter: Payer: Self-pay | Admitting: Sports Medicine

## 2014-10-23 VITALS — BP 113/68 | HR 75 | Wt 213.0 lb

## 2014-10-23 DIAGNOSIS — E291 Testicular hypofunction: Secondary | ICD-10-CM | POA: Diagnosis not present

## 2014-10-23 DIAGNOSIS — L739 Follicular disorder, unspecified: Secondary | ICD-10-CM | POA: Diagnosis not present

## 2014-10-23 MED ORDER — TESTOSTERONE CYPIONATE 200 MG/ML IM SOLN
200.0000 mg | Freq: Once | INTRAMUSCULAR | Status: AC
  Administered 2014-10-23: 200 mg via INTRAMUSCULAR

## 2014-10-23 MED ORDER — TRIAMCINOLONE ACETONIDE 0.5 % EX CREA: 1.0000 "application " | TOPICAL_CREAM | Freq: Two times a day (BID) | CUTANEOUS | Status: DC

## 2014-10-23 MED ORDER — DOXYCYCLINE HYCLATE 100 MG PO TABS: 100.0000 mg | ORAL_TABLET | Freq: Two times a day (BID) | ORAL | Status: AC

## 2014-10-23 NOTE — Assessment & Plan Note (Signed)
Doxycycline, triamcinolone

## 2014-10-23 NOTE — Assessment & Plan Note (Signed)
Has since come off of Celexa. Still uses trazodone one half tablet bedtime occasionally. His testosterone levels were in the low limit of the normal range so we are going to start testosterone supplementation today to see if it improves his symptoms. Risks and benefits discussed. Testosterone 200 mg intramuscular every 2 weeks, after second injection we'll check the levels in one week.

## 2014-10-23 NOTE — Progress Notes (Signed)
  Subjective:    CC: Follow-up  HPI: Fatigue: Overall did well initially with Celexa and trazodone, has stopped Celexa, taking trazodone as needed for sleep. Testosterone levels were only 370.  Past medical history, Surgical history, Family history not pertinant except as noted below, Social history, Allergies, and medications have been entered into the medical record, reviewed, and no changes needed.   Review of Systems: No fevers, chills, night sweats, weight loss, chest pain, or shortness of breath.   Objective:    General: Well Developed, well nourished, and in no acute distress.  Neuro: Alert and oriented x3, extra-ocular muscles intact, sensation grossly intact.  HEENT: Normocephalic, atraumatic, pupils equal round reactive to light, neck supple, no masses, no lymphadenopathy, thyroid nonpalpable.  Skin: Warm and dry, no rashes. Cardiac: Regular rate and rhythm, no murmurs rubs or gallops, no lower extremity edema.  Respiratory: Clear to auscultation bilaterally. Not using accessory muscles, speaking in full sentences.  Impression and Recommendations:    I spent 40 minutes with this patient, 50% was face-to-face time counseling regarding the above diagnosis of fatigue

## 2014-11-06 ENCOUNTER — Ambulatory Visit (INDEPENDENT_AMBULATORY_CARE_PROVIDER_SITE_OTHER): Payer: 59 | Admitting: Sports Medicine

## 2014-11-06 VITALS — BP 125/74 | HR 81 | Wt 221.0 lb

## 2014-11-06 DIAGNOSIS — E291 Testicular hypofunction: Secondary | ICD-10-CM

## 2014-11-06 MED ORDER — TESTOSTERONE CYPIONATE 200 MG/ML IM SOLN
200.0000 mg | Freq: Once | INTRAMUSCULAR | Status: AC
  Administered 2014-11-06: 200 mg via INTRAMUSCULAR

## 2014-11-06 NOTE — Progress Notes (Signed)
   Subjective:    Patient ID: Brandon Riley, male    DOB: 01/15/1961, 54 y.o.   MRN: [id number]  HPI  Brandon Riley is here for a testosterone injection. Denies chest pain, shortness of breath, headaches or mood changes.    Review of Systems     Objective:   Physical Exam        Assessment & Plan:  Patient tolerated injection well without complications. Patient advised to schedule next injection 14 days from today.

## 2014-11-06 NOTE — Assessment & Plan Note (Signed)
Testosterone injection as above. 

## 2014-11-13 LAB — CBC
HCT: 48 % (ref 39.0–52.0)
Hemoglobin: 16.6 g/dL (ref 13.0–17.0)
MCH: 31.4 pg (ref 26.0–34.0)
MCHC: 34.6 g/dL (ref 30.0–36.0)
MCV: 90.9 fL (ref 78.0–100.0)
MPV: 9.5 fL (ref 8.6–12.4)
Platelets: 202 10*3/uL (ref 150–400)
RBC: 5.28 MIL/uL (ref 4.22–5.81)
RDW: 13.8 % (ref 11.5–15.5)
WBC: 4.1 10*3/uL (ref 4.0–10.5)

## 2014-11-14 LAB — PSA, TOTAL AND FREE
PSA, Free Pct: 24 % (ref >25)
PSA, Free: 1.06 ng/mL
PSA: 4.37 ng/mL — ABNORMAL HIGH (ref <=4.00)

## 2014-11-14 LAB — TESTOSTERONE, FREE, TOTAL, SHBG
Sex Hormone Binding: 32 nmol/L (ref 10–50)
Testosterone, Free: 204.3 pg/mL (ref 47.0–244.0)
Testosterone-% Free: 2.4 % (ref 1.6–2.9)
Testosterone: 862 ng/dL (ref 300–890)

## 2014-11-18 ENCOUNTER — Other Ambulatory Visit: Payer: Self-pay | Admitting: Sports Medicine

## 2014-11-20 ENCOUNTER — Ambulatory Visit (INDEPENDENT_AMBULATORY_CARE_PROVIDER_SITE_OTHER): Payer: 59 | Admitting: Family Medicine

## 2014-11-20 VITALS — BP 141/93 | HR 84 | Wt 219.0 lb

## 2014-11-20 DIAGNOSIS — E291 Testicular hypofunction: Secondary | ICD-10-CM | POA: Diagnosis not present

## 2014-11-20 MED ORDER — TESTOSTERONE CYPIONATE 200 MG/ML IM SOLN
200.0000 mg | Freq: Once | INTRAMUSCULAR | Status: AC
  Administered 2014-11-20: 200 mg via INTRAMUSCULAR
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
## 2014-11-20 NOTE — Progress Notes (Signed)
Agree with the nurse note

## 2014-12-04 ENCOUNTER — Ambulatory Visit (INDEPENDENT_AMBULATORY_CARE_PROVIDER_SITE_OTHER): Payer: 59 | Admitting: Sports Medicine

## 2014-12-04 VITALS — BP 132/86 | HR 87 | Wt 218.0 lb

## 2014-12-04 DIAGNOSIS — E291 Testicular hypofunction: Secondary | ICD-10-CM | POA: Diagnosis not present

## 2014-12-04 MED ORDER — TESTOSTERONE CYPIONATE 200 MG/ML IM SOLN
200.0000 mg | Freq: Once | INTRAMUSCULAR | Status: AC
Start: 2014-12-04 — End: 2014-12-04
  Administered 2014-12-04: 200 mg via INTRAMUSCULAR

## 2014-12-04 NOTE — Progress Notes (Signed)
   Subjective:    Patient ID: Brandon Riley, male    DOB: 18-Oct-1960, 54 y.o.   MRN: [id number]  HPI Patient came into office today for testosterone injection. Denies chest pain, shortness of breath, headaches and problems associated with taking this medication. Patient states he has had no abnornal mood swings.    Review of Systems     Objective:   Physical Exam        Assessment & Plan:  Patient tolerated injection in LOUQ well without complications. Patient advised to schedule his next injection for 2 weeks from today.

## 2014-12-04 NOTE — Assessment & Plan Note (Signed)
Testosterone injection as above. 

## 2014-12-12 ENCOUNTER — Encounter: Payer: Self-pay | Admitting: Sports Medicine

## 2014-12-12 DIAGNOSIS — Z1159 Encounter for screening for other viral diseases: Secondary | ICD-10-CM

## 2014-12-14 ENCOUNTER — Encounter: Payer: Self-pay | Admitting: Sports Medicine

## 2014-12-18 ENCOUNTER — Ambulatory Visit (INDEPENDENT_AMBULATORY_CARE_PROVIDER_SITE_OTHER): Payer: 59 | Admitting: Sports Medicine

## 2014-12-18 VITALS — BP 152/88 | HR 88 | Temp 98.0°F | Wt 228.0 lb

## 2014-12-18 DIAGNOSIS — E291 Testicular hypofunction: Secondary | ICD-10-CM

## 2014-12-18 DIAGNOSIS — Z23 Encounter for immunization: Secondary | ICD-10-CM | POA: Diagnosis not present

## 2014-12-18 MED ORDER — TESTOSTERONE CYPIONATE 200 MG/ML IM SOLN
200.0000 mg | Freq: Once | INTRAMUSCULAR | Status: AC
  Administered 2014-12-18: 200 mg via INTRAMUSCULAR

## 2014-12-18 NOTE — Progress Notes (Signed)
   Subjective:    Patient ID: Brandon Riley, male    DOB: 1961-04-03, 54 y.o.   MRN: [id number]  HPI  Patient came into office today for testosterone injection. Denies chest pain, shortness of breath, headaches and problems associated with taking this medication. Patient states he has had no abnornal mood swings. Pt also wanted to get a flu shot today to prepare for an upcoming trip to [place] in October. Pt was given the flu shot questionnaire to fill out prior to administration.   Review of Systems     Objective:   Physical Exam        Assessment & Plan:  Patient tolerated injection in ROUQ well without complications. Patient advised to schedule his next injection for 2 weeks from today. Pt tolerated Flu immunization in Left Deltoid well with no immediate complications. Pt was given a printed pamphlet of information on the flu shot prior to leaving. No further questions.

## 2014-12-18 NOTE — Assessment & Plan Note (Signed)
Testosterone injection as above. 

## 2015-01-08 ENCOUNTER — Ambulatory Visit (INDEPENDENT_AMBULATORY_CARE_PROVIDER_SITE_OTHER): Payer: 59 | Admitting: Sports Medicine

## 2015-01-08 VITALS — BP 131/85 | HR 72 | Wt 222.0 lb

## 2015-01-08 DIAGNOSIS — E291 Testicular hypofunction: Secondary | ICD-10-CM | POA: Diagnosis not present

## 2015-01-08 MED ORDER — TESTOSTERONE CYPIONATE 200 MG/ML IM SOLN
200.0000 mg | Freq: Once | INTRAMUSCULAR | Status: AC
  Administered 2015-01-08: 200 mg via INTRAMUSCULAR

## 2015-01-08 NOTE — Progress Notes (Signed)
   Subjective:    Patient ID: Brandon Riley, male    DOB: 08/16/1960, 54 y.o.   MRN: [id number]  HPI  Patient came into office today for testosterone injection. Denies chest pain, shortness of breath, headaches and problems associated with taking this medication. Patient states he has had no abnornal mood swings.   Review of Systems     Objective:   Physical Exam        Assessment & Plan:  Patient tolerated injection in LOUQ well without complications. Patient advised to schedule his next injection for 2 weeks from today.

## 2015-01-10 ENCOUNTER — Encounter: Payer: Self-pay | Admitting: Sports Medicine

## 2015-01-11 ENCOUNTER — Telehealth: Payer: Self-pay

## 2015-01-11 NOTE — Telephone Encounter (Signed)
citalopram 20 MG tablet         Commonly known as: CELEXA -- FYI - quit taking this medication on my own account. Remove from chart. Thank you    Discontinued medication per patient.

## 2015-01-16 ENCOUNTER — Encounter: Payer: Self-pay | Admitting: Sports Medicine

## 2015-01-16 DIAGNOSIS — G4719 Other hypersomnia: Secondary | ICD-10-CM

## 2015-01-16 MED ORDER — TRAZODONE HCL 50 MG PO TABS: 25.0000 mg | ORAL_TABLET | Freq: Every day | ORAL | Status: DC

## 2015-01-29 ENCOUNTER — Encounter: Payer: Self-pay | Admitting: Sports Medicine

## 2015-01-29 ENCOUNTER — Ambulatory Visit: Payer: 59

## 2015-01-29 ENCOUNTER — Ambulatory Visit (INDEPENDENT_AMBULATORY_CARE_PROVIDER_SITE_OTHER): Payer: 59 | Admitting: Sports Medicine

## 2015-01-29 VITALS — BP 144/92 | HR 72 | Wt 220.0 lb

## 2015-01-29 DIAGNOSIS — G4733 Obstructive sleep apnea (adult) (pediatric): Secondary | ICD-10-CM

## 2015-01-29 DIAGNOSIS — E291 Testicular hypofunction: Secondary | ICD-10-CM | POA: Diagnosis not present

## 2015-01-29 MED ORDER — ROPINIROLE HCL 0.25 MG PO TABS: 0.2500 mg | ORAL_TABLET | Freq: Every day | ORAL | Status: DC

## 2015-01-29 NOTE — Assessment & Plan Note (Signed)
Adding Requip

## 2015-01-29 NOTE — Assessment & Plan Note (Signed)
Testosterone levels have improved significantly and sex drive has significantly improved however still feels fatigued in the morning. At this point because it did not work to control the symptoms that we were focusing on we will stop testosterone supplementation. He is currently treated with CPAP with good control. 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

## 2015-01-29 NOTE — Progress Notes (Signed)
  Subjective:    CC: Follow-up  HPI: Fatigue: Persistent despite improving testosterone levels, as well as controlling his sleep apnea. Patient is not depressed. He has noted increased libido with normalization of testosterone however is amenable to discontinue treatment due to lack of control of the intended symptom, on further questioning his wife did video him sleeping, and he does show periodic limb movements that he thinks may be contributory.  Epigastric pain: Resolved now however occurred after an episode of binge drinking approximately 6 beers, he was able to self control and with omeprazole, no melena or hematochezia, no hematemesis.  Past medical history, Surgical history, Family history not pertinant except as noted below, Social history, Allergies, and medications have been entered into the medical record, reviewed, and no changes needed.   Review of Systems: No fevers, chills, night sweats, weight loss, chest pain, or shortness of breath.   Objective:    General: Well Developed, well nourished, and in no acute distress.  Neuro: Alert and oriented x3, extra-ocular muscles intact, sensation grossly intact.  HEENT: Normocephalic, atraumatic, pupils equal round reactive to light, neck supple, no masses, no lymphadenopathy, thyroid nonpalpable.  Skin: Warm and dry, no rashes. Cardiac: Regular rate and rhythm, no murmurs rubs or gallops, no lower extremity edema.  Respiratory: Clear to auscultation bilaterally. Not using accessory muscles, speaking in full sentences.  Impression and Recommendations:

## 2015-01-29 NOTE — Patient Instructions (Signed)
Restless Legs Syndrome Restless legs syndrome is a movement disorder. It may also be called a sensorimotor disorder.  CAUSES  No one knows what specifically causes restless legs syndrome, but it tends to run in families. It is also more common in people with low iron, in pregnancy, in people who need dialysis, and those with nerve damage (neuropathy).Some medications may make restless legs syndrome worse.Those medications include drugs to treat high blood pressure, some heart conditions, nausea, colds, allergies, and depression. SYMPTOMS Symptoms include uncomfortable sensations in the legs. These leg sensations are worse during periods of inactivity or rest. They are also worse while sitting or lying down. Individuals that have the disorder describe sensations in the legs that feel like:  Pulling.  Drawing.  Crawling.  Worming.  Boring.  Tingling.  Pins and needles.  Prickling.  Pain. The sensations are usually accompanied by an overwhelming urge to move the legs. Sudden muscle jerks may also occur. Movement provides temporary relief from the discomfort. In rare cases, the arms may also be affected. Symptoms may interfere with going to sleep (sleep onset insomnia). Restless legs syndrome may also be related to periodic limb movement disorder (PLMD). PLMD is another more common motor disorder. It also causes interrupted sleep. The symptoms from PLMD usually occur most often when you are awake. TREATMENT  Treatment for restless legs syndrome is symptomatic. This means that the symptoms are treated.   Massage and cold compresses may provide temporary relief.  Walk, stretch, or take a cold or hot bath.  Get regular exercise and a good night's sleep.  Avoid caffeine, alcohol, nicotine, and medications that can make it worse.  Do activities that provide mental stimulation like discussions, needlework, and video games. These may be helpful if you are not able to walk or stretch. Some  medications are effective in relieving the symptoms. However, many of these medications have side effects. Ask your caregiver about medications that may help your symptoms. Correcting iron deficiency may improve symptoms for some patients. Document Released: 04/04/2002 Document Revised: 08/29/2013 Document Reviewed: 07/11/2010 [employer] Patient Information 2015 [employer]. This information is not intended to replace advice given to you by your health care provider. Make sure you discuss any questions you have with your health care provider.  

## 2015-02-04 ENCOUNTER — Encounter: Payer: Self-pay | Admitting: Sports Medicine

## 2015-02-08 NOTE — Telephone Encounter (Signed)
Perrin MalteseRhonda, Keigo is telling me he got the Hepatitis panel drawn but I'm not seeing any evidence of it in epic, would you call [facility] to see what happened?

## 2015-03-05 ENCOUNTER — Ambulatory Visit (INDEPENDENT_AMBULATORY_CARE_PROVIDER_SITE_OTHER): Payer: 59 | Admitting: Sports Medicine

## 2015-03-05 ENCOUNTER — Encounter: Payer: Self-pay | Admitting: Sports Medicine

## 2015-03-05 VITALS — BP 135/86 | HR 76 | Temp 97.6°F | Resp 18 | Wt 225.2 lb

## 2015-03-05 DIAGNOSIS — G4733 Obstructive sleep apnea (adult) (pediatric): Secondary | ICD-10-CM | POA: Diagnosis not present

## 2015-03-05 NOTE — Assessment & Plan Note (Signed)
Overall feeling very well, good control of periodic limb movement disorder with Requip. At this point his fatigue is beyond the limits of allopathic medicine and I have recommended that he consider complementary and alternatives measures.

## 2015-03-05 NOTE — Progress Notes (Signed)
  Subjective:    CC: Follow-up  HPI: Hypogonadism: Testosterone levels are in the 800s, good sex, happy with how things are going.  Obstructive sleep apnea with periodic limb movement disorder: Resolved with Requip  Fatigue: At this point he has had a negative CBC, negative screenings for depression, normal thyroid function, correction of sleep apnea, periodic limb movement disorder, and male hypogonadism, all of the relevant lab tests have been negative, he understands that this simply may be normal aging for him.  Past medical history, Surgical history, Family history not pertinant except as noted below, Social history, Allergies, and medications have been entered into the medical record, reviewed, and no changes needed.   Review of Systems: No fevers, chills, night sweats, weight loss, chest pain, or shortness of breath.   Objective:    General: Well Developed, well nourished, and in no acute distress.  Neuro: Alert and oriented x3, extra-ocular muscles intact, sensation grossly intact.  HEENT: Normocephalic, atraumatic, pupils equal round reactive to light, neck supple, no masses, no lymphadenopathy, thyroid nonpalpable.  Skin: Warm and dry, no rashes. Cardiac: Regular rate and rhythm, no murmurs rubs or gallops, no lower extremity edema.  Respiratory: Clear to auscultation bilaterally. Not using accessory muscles, speaking in full sentences.  Impression and Recommendations:    I spent 25 minutes with this patient, greater than 50% was face-to-face time counseling regarding the above diagnoses

## 2015-03-13 ENCOUNTER — Encounter: Payer: Self-pay | Admitting: Sports Medicine

## 2015-04-04 ENCOUNTER — Encounter: Payer: Self-pay | Admitting: Sports Medicine

## 2015-04-10 ENCOUNTER — Encounter: Payer: Self-pay | Admitting: Sports Medicine

## 2015-04-11 ENCOUNTER — Other Ambulatory Visit: Payer: Self-pay

## 2015-04-11 MED ORDER — OMEPRAZOLE 40 MG PO CPDR: DELAYED_RELEASE_CAPSULE | ORAL | Status: DC

## 2015-05-25 ENCOUNTER — Encounter: Payer: Self-pay | Admitting: Sports Medicine

## 2015-05-25 ENCOUNTER — Other Ambulatory Visit: Payer: Self-pay | Admitting: Sports Medicine

## 2015-06-24 ENCOUNTER — Other Ambulatory Visit: Payer: Self-pay | Admitting: Sports Medicine

## 2015-07-22 ENCOUNTER — Other Ambulatory Visit: Payer: Self-pay | Admitting: Sports Medicine

## 2015-09-13 ENCOUNTER — Other Ambulatory Visit: Payer: Self-pay | Admitting: Sports Medicine

## 2015-09-13 MED ORDER — ROPINIROLE HCL 0.25 MG PO TABS: ORAL_TABLET | ORAL | Status: DC

## 2015-10-06 ENCOUNTER — Encounter: Payer: Self-pay | Admitting: Emergency Medicine

## 2015-10-06 ENCOUNTER — Emergency Department
Admission: EM | Admit: 2015-10-06 | Discharge: 2015-10-06 | Disposition: A | Discharge status: Home / Self Care | Payer: BLUE CROSS/BLUE SHIELD | Source: Home / Self Care | Attending: Family Medicine | Admitting: Family Medicine

## 2015-10-06 DIAGNOSIS — R197 Diarrhea, unspecified: Secondary | ICD-10-CM

## 2015-10-06 DIAGNOSIS — R110 Nausea: Secondary | ICD-10-CM

## 2015-10-06 DIAGNOSIS — A09 Infectious gastroenteritis and colitis, unspecified: Secondary | ICD-10-CM | POA: Diagnosis not present

## 2015-10-06 HISTORY — DX: Gastro-esophageal reflux disease without esophagitis: K21.9

## 2015-10-06 HISTORY — DX: Restless legs syndrome: G25.81

## 2015-10-06 MED ORDER — ONDANSETRON 4 MG PO TBDP
ORAL_TABLET | ORAL | Status: DC
Start: 2015-10-06 — End: 2015-11-05

## 2015-10-06 MED ORDER — ONDANSETRON HCL 4 MG PO TABS: 4.0000 mg | ORAL_TABLET | Freq: Once | ORAL | Status: DC

## 2015-10-06 NOTE — Discharge Instructions (Signed)
Begin clear liquids (Pedialyte while having diarrhea) until improved, then advance to a [employer] (Bananas, Rice, Applesauce, Toast).  Then gradually resume a regular diet when tolerated.  Avoid milk products until well.  To decrease diarrhea, mix one teaspoon Citrucel (methylcellulose) in 2 oz water and drink one to three times daily.  Do not drink extra fluids with this dose and do not drink fluids for one hour afterwards.  When stools become more formed, may take Imodium (loperamide) once or twice daily to decrease stool frequency.  If symptoms become significantly worse during the night or over the weekend, proceed to the local emergency room.   Diarrhea Diarrhea is frequent loose and watery bowel movements. It can cause you to feel weak and dehydrated. Dehydration can cause you to become tired and thirsty, have a dry mouth, and have decreased urination that often is dark yellow. Diarrhea is a sign of another problem, most often an infection that will not last long. In most cases, diarrhea typically lasts 2-3 days. However, it can last longer if it is a sign of something more serious. It is important to treat your diarrhea as directed by your caregiver to lessen or prevent future episodes of diarrhea. CAUSES  Some common causes include:  Gastrointestinal infections caused by viruses, bacteria, or parasites.  Food poisoning or food allergies.  Certain medicines, such as antibiotics, chemotherapy, and laxatives.  Artificial sweeteners and fructose.  Digestive disorders. HOME CARE INSTRUCTIONS  Ensure adequate fluid intake (hydration): Have 1 cup (8 oz) of fluid for each diarrhea episode. Avoid fluids that contain simple sugars or sports drinks, fruit juices, whole milk products, and sodas. Your urine should be clear or pale yellow if you are drinking enough fluids. Hydrate with an oral rehydration solution that you can purchase at pharmacies, retail stores, and online. You can prepare an oral  rehydration solution at home by mixing the following ingredients together:   - tsp table salt.   tsp baking soda.   tsp salt substitute containing potassium chloride.  1  tablespoons sugar.  1 L (34 oz) of water.  Certain foods and beverages may increase the speed at which food moves through the gastrointestinal (GI) tract. These foods and beverages should be avoided and include:  Caffeinated and alcoholic beverages.  High-fiber foods, such as raw fruits and vegetables, nuts, seeds, and whole grain breads and cereals.  Foods and beverages sweetened with sugar alcohols, such as xylitol, sorbitol, and mannitol.  Some foods may be well tolerated and may help thicken stool including:  Starchy foods, such as rice, toast, pasta, low-sugar cereal, oatmeal, grits, baked potatoes, crackers, and bagels.  Bananas.  Applesauce.  Add probiotic-rich foods to help increase healthy bacteria in the GI tract, such as yogurt and fermented milk products.  Wash your hands well after each diarrhea episode.  Only take over-the-counter or prescription medicines as directed by your caregiver.  Take a warm bath to relieve any burning or pain from frequent diarrhea episodes. SEEK IMMEDIATE MEDICAL CARE IF:   You are unable to keep fluids down.  You have persistent vomiting.  You have blood in your stool, or your stools are black and tarry.  You do not urinate in 6-8 hours, or there is only a small amount of very dark urine.  You have abdominal pain that increases or localizes.  You have weakness, dizziness, confusion, or light-headedness.  You have a severe headache.  Your diarrhea gets worse or does not get better.  You  have a fever or persistent symptoms for more than 2-3 days.  You have a fever and your symptoms suddenly get worse. MAKE SURE YOU:   Understand these instructions.  Will watch your condition.  Will get help right away if you are not doing well or get worse.     This information is not intended to replace advice given to you by your health care provider. Make sure you discuss any questions you have with your health care provider.   Document Released: 04/04/2002 Document Revised: 05/05/2014 Document Reviewed: 12/21/2011 Elsevier Interactive Patient Education [employer]2016 Elsevier Inc.

## 2015-10-06 NOTE — ED Notes (Signed)
Reports onset of nausea, projectile vomiting and diarrhea 2 days ago; now nausea and diarrhea; daughter had same syndrome earlier in week.

## 2015-10-06 NOTE — ED Provider Notes (Signed)
CSN: [id number]     Arrival date & time 10/06/15  1224 History   First MD Initiated Contact with Patient 10/06/15 1310     Chief Complaint  Patient presents with  . Diarrhea  . Nausea  . Abdominal Pain      HPI Comments: Two days ago patient became fatigued, followed by development of abdominal cramps, fever, headache, and nausea/vomiting.  Yesterday he developed watery diarrhea.  His vomiting has resolved.  His daughter had similar symptoms but feels better now.  Denies recent foreign travel, or drinking untreated water in a wilderness environment.  He denies recent antibiotic use.   Patient is a 55 y.o. male presenting with diarrhea. The history is provided by the patient.  Diarrhea Quality:  Watery Severity:  Moderate Onset quality:  Sudden Duration:  1 day Timing:  Intermittent Progression:  Improving Relieved by:  Nothing Worsened by:  Nothing tried Ineffective treatments:  None tried Associated symptoms: abdominal pain, chills, diaphoresis, fever, headaches, myalgias and vomiting   Associated symptoms: no arthralgias, no recent cough and no URI   Vomiting:    Quality:  Stomach contents   Severity:  Moderate   Duration:  1 day   Progression:  Resolved Risk factors: sick contacts   Risk factors: no recent antibiotic use, no suspicious food intake and no travel to endemic areas     Past Medical History  Diagnosis Date  . GERD (gastroesophageal reflux disease)   . Restless leg syndrome    Past Surgical History  Procedure Laterality Date  . Wisdom tooth extraction  2012   History reviewed. No pertinent family history. Social History  Substance Use Topics  . Smoking status: Never Smoker   . Smokeless tobacco: None  . Alcohol Use: Yes    Review of Systems  Constitutional: Positive for fever, chills and diaphoresis.  Gastrointestinal: Positive for vomiting, abdominal pain and diarrhea.  Musculoskeletal: Positive for myalgias. Negative for arthralgias.   Neurological: Positive for headaches.  All other systems reviewed and are negative.   Allergies  Celebrex and Flexeril  Home Medications   Prior to Admission medications   Medication Sig Start Date End Date Taking? Authorizing Provider  omeprazole (PRILOSEC) 40 MG capsule TAKE 1 CAPSULE (40 MG TOTAL) BY MOUTH DAILY. 04/11/15   Monica Becton, MD  ondansetron (ZOFRAN ODT) 4 MG disintegrating tablet Take one tab by mouth Q6hr prn nausea.  Dissolve under tongue. 10/06/15   Lattie Haw, MD  rOPINIRole (REQUIP) 0.25 MG tablet TAKE 1 TABLET (0.25 MG TOTAL) BY MOUTH AT BEDTIME. INCREASE BY 0.25 MG EVERY TWO TO THREE DAYS UNTIL RELIEF IS OBTAINED 09/13/15   Monica Becton, MD  traZODone (DESYREL) 50 MG tablet Take 0.5 tablets (25 mg total) by mouth at bedtime. 01/16/15   Monica Becton, MD   Meds Ordered and Administered this Visit   Medications  ondansetron [facility]) tablet 4 mg (not administered)    BP 125/84 mmHg  Pulse 87  Temp(Src) 97.5 F (36.4 C) (Oral)  Resp 16  Ht 5' 8.5" (1.74 m)  Wt 215 lb (97.523 kg)  BMI 32.21 kg/m2  SpO2 97% No data found.   Physical Exam Nursing notes and Vital Signs reviewed. Appearance:  Patient appears stated age, and in no acute distress Eyes:  Pupils are equal, round, and reactive to light and accomodation.  Extraocular movement is intact.  Conjunctivae are not inflamed  Ears:  Canals normal.  Tympanic membranes normal.  Nose:   Normal turbinates.  No sinus tenderness.   Mouth/Pharynx:  Normal; moist mucous membranes  Neck:  Supple.  No adenopathy Lungs:  Clear to auscultation.  Breath sounds are equal.  Moving air well. Heart:  Regular rate and rhythm without murmurs, rubs, or gallops.  Abdomen:  Nontender without masses or hepatosplenomegaly.  Bowel sounds are present.  No CVA or flank tenderness.  Extremities:  No edema.  Skin:  No rash present.   ED Course  Procedures none  MDM   1. Nausea without vomiting    2. Diarrhea of presumed infectious origin; suspect viral gastroenteritis   Administered Zofran ODT 4mg  PO; Rx for same given. Begin clear liquids (Pedialyte while having diarrhea) until improved, then advance to a [employer] diet (Bananas, Rice, Applesauce, Toast).  Then gradually resume a regular diet when tolerated.  Avoid milk products until well.  To decrease diarrhea, mix one teaspoon Citrucel (methylcellulose) in 2 oz water and drink one to three times daily.  Do not drink extra fluids with this dose and do not drink fluids for one hour afterwards.  When stools become more formed, may take Imodium (loperamide) once or twice daily to decrease stool frequency.  If symptoms become significantly worse during the night or over the weekend, proceed to the local emergency room. Followup with Family Doctor if not improved in about 4 days.    Lattie HawStephen A Beese, MD 10/13/15 1256

## 2015-11-05 ENCOUNTER — Ambulatory Visit (INDEPENDENT_AMBULATORY_CARE_PROVIDER_SITE_OTHER): Payer: BLUE CROSS/BLUE SHIELD | Admitting: Sports Medicine

## 2015-11-05 ENCOUNTER — Ambulatory Visit (INDEPENDENT_AMBULATORY_CARE_PROVIDER_SITE_OTHER): Payer: BLUE CROSS/BLUE SHIELD

## 2015-11-05 ENCOUNTER — Encounter: Payer: Self-pay | Admitting: Sports Medicine

## 2015-11-05 VITALS — BP 147/94 | HR 90 | Resp 18 | Wt 224.2 lb

## 2015-11-05 DIAGNOSIS — M7662 Achilles tendinitis, left leg: Secondary | ICD-10-CM | POA: Insufficient documentation

## 2015-11-05 MED ORDER — NITROGLYCERIN 0.2 MG/HR TD PT24: MEDICATED_PATCH | TRANSDERMAL | Status: DC

## 2015-11-05 NOTE — Progress Notes (Signed)
  Subjective:    CC: Left heel pain  HPI: This is a pleasant 55 year old male with a long history of pain that he localizes at the posterior aspect of his heel of the calcaneal insertion. Pain is moderate, persistent without radiation, worse in the morning.  Past medical history, Surgical history, Family history not pertinant except as noted below, Social history, Allergies, and medications have been entered into the medical record, reviewed, and no changes needed.   Review of Systems: No fevers, chills, night sweats, weight loss, chest pain, or shortness of breath.   Objective:    General: Well Developed, well nourished, and in no acute distress.  Neuro: Alert and oriented x3, extra-ocular muscles intact, sensation grossly intact.  HEENT: Normocephalic, atraumatic, pupils equal round reactive to light, neck supple, no masses, no lymphadenopathy, thyroid nonpalpable.  Skin: Warm and dry, no rashes. Cardiac: Regular rate and rhythm, no murmurs rubs or gallops, no lower extremity edema.  Respiratory: Clear to auscultation bilaterally. Not using accessory muscles, speaking in full sentences. Left Ankle: No visible erythema or swelling. Range of motion is full in all directions. Strength is 5/5 in all directions. Stable lateral and medial ligaments; squeeze test and kleiger test unremarkable; Talar dome nontender; No pain at base of 5th MT; No tenderness over cuboid; No tenderness over N spot or navicular prominence No tenderness on posterior aspects of lateral and medial malleolus No sign of peroneal tendon subluxations; Negative tarsal tunnel tinel's Tender to palpation at the calcaneal insertion of the Achilles Negative Thompson's test.  Impression and Recommendations:

## 2015-11-05 NOTE — Assessment & Plan Note (Signed)
X-rays, eccentric physical therapy, topical nitroglycerin.

## 2015-11-12 ENCOUNTER — Ambulatory Visit (INDEPENDENT_AMBULATORY_CARE_PROVIDER_SITE_OTHER): Payer: BLUE CROSS/BLUE SHIELD | Admitting: Rehabilitative and Restorative Service Providers"

## 2015-11-12 ENCOUNTER — Encounter: Payer: Self-pay | Admitting: Rehabilitative and Restorative Service Providers"

## 2015-11-12 DIAGNOSIS — R531 Weakness: Secondary | ICD-10-CM

## 2015-11-12 DIAGNOSIS — R2689 Other abnormalities of gait and mobility: Secondary | ICD-10-CM

## 2015-11-12 DIAGNOSIS — M25572 Pain in left ankle and joints of left foot: Secondary | ICD-10-CM | POA: Diagnosis not present

## 2015-11-12 NOTE — Therapy (Signed)
[facility] [address] [place], [place], [address] Phone: [phone]   Fax:  [phone]  Physical Therapy Evaluation  Patient Details  Name: Brandon PangJohn Riley MRN: [id number] Date of Birth: 05/27/1960 Referring Provider: Dr. Benjamin Stainhekkekandam  Encounter Date: 11/12/2015      PT End of Session - 11/12/15 0848    Visit Number 1   Number of Visits 12   Date for PT Re-Evaluation 12/24/15   PT Start Time 0848   PT Stop Time 0945   PT Time Calculation (min) 57 min   Activity Tolerance Patient tolerated treatment well      Past Medical History  Diagnosis Date  . GERD (gastroesophageal reflux disease)   . Restless leg syndrome     Past Surgical History  Procedure Laterality Date  . Wisdom tooth extraction  2012    There were no vitals filed for this visit.       Subjective Assessment - 11/12/15 0850    Subjective Brandon Riley reports that he has had heel pain over the past 2-3 months but on 10/29/15 when he tripped on a step stepping down on the Rt heel with all his weight. He was seen by MD and diagnosed with Achilles tendon sprain. He has improved with rest and nitro patches.  he still has "tenderness" in the foot and ankle.    Pertinent History LBP since he was in his 20's; neck problems folling cervical fx at 55 years old   How long can you sit comfortably? no limit   How long can you stand comfortably? ok for a few minutes - can "work through" the pain to stand as needed   How long can you walk comfortably? as needed    Diagnostic tests xrays (-)   Patient Stated Goals quit hurting   Currently in Pain? Yes   Pain Score 2    Pain Location Heel   Pain Orientation Right   Pain Descriptors / Indicators Nagging;Sore;Aching   Pain Type Acute pain   Pain Radiating Towards in heel and Achilles 99% of time 1% of time will feel pain inthe posterior calf    Pain Onset 1 to 4 weeks ago   Pain Frequency Intermittent  more constant than  not    Aggravating Factors  worse when not wearing shoes; weight bearing    Pain Relieving Factors better with shoes            [facility] PT Assessment - 11/12/15 0001    Assessment   Medical Diagnosis Rt Achilles tendinosis   Referring Provider Dr. Benjamin Stainhekkekandam   Onset Date/Surgical Date 10/29/15   Hand Dominance Right   Next MD Visit 8/17   Prior Therapy none for current problem    Precautions   Precautions None   Restrictions   Weight Bearing Restrictions No   Balance Screen   Has the patient fallen in the past 6 months No   Has the patient had a decrease in activity level because of a fear of falling?  No   Is the patient reluctant to leave their home because of a fear of falling?  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    [place]    Observation/Other Assessments   Focus on Therapeutic Outcomes (FOTO)  59% limitation    Sensation   Additional Comments WFL's    Posture/Postural Control   Posture Comments WFL's standing with feet ER - decreaed longitudinal arch Rt foot; decreased calf girth Rt compared to LT    AROM   Right/Left Hip --  WFL's bilat    Right/Left Knee --  WFL's bilat    Right Ankle Dorsiflexion 0   Right Ankle Plantar Flexion 48   Right Ankle Inversion 31   Right Ankle Eversion 11   Left Ankle Dorsiflexion 3   Left Ankle Plantar Flexion 54   Left Ankle Inversion 36   Left Ankle Eversion 12   Strength   Overall Strength Comments 5/5 bilat LE's except Rt ankle ever 5-/5; PF 4-/5   Flexibility   Hamstrings tight Rt > Lt    Quadriceps WFL's   ITB tight Rt > lt   Piriformis tight Rt > Lt    Palpation   Palpation comment tender to touch posterior achilles area  with evidence of edema and discoloration in this area;; hypomobility through Rt cancaneus/talus/metatarsals    Ambulation/Gait   Gait Comments antalgic gait Rt LE - with decreaed wt bearing Rt stance phase                    [facility] Adult PT Treatment/Exercise - 11/12/15 0001    Exercises   Exercises Ankle;Knee/Hip   Knee/Hip Exercises: Stretches   Passive Hamstring Stretch Right;3 reps;60 seconds   ITB Stretch 3 reps;Right;30 seconds   Piriformis Stretch Right;Left;2 reps;30 seconds   Gastroc Stretch Right;2 reps;30 seconds   Soleus Stretch Right;2 reps;30 seconds   Modalities   Modalities Iontophoresis   Iontophoresis   Type of Iontophoresis Dexamethasone   Location Rt Achilles Tendon   Dose 1.0 cc, 120 mA patch   Time 8 hrs                 PT Education - 11/12/15 0925    Education provided Yes   Education Details HEP; ionto use and care - to remove in 8 hrs; to use ice for swelling and pain    Person(s) Educated Patient   Methods Explanation;Handout   Comprehension Verbalized understanding;Returned demonstration             PT Long Term Goals - 11/12/15 1155    PT LONG TERM GOAL #1   Title Increase ankle DF to 5-7 degrees 12/24/15   Time 6   Period Weeks   Status New   PT LONG TERM GOAL #2   Title 5-/5 to 5/5 strength Rt ankle 12/24/15   Time 6   Period Weeks   Status New   PT LONG TERM GOAL #3   Title Normal gait pattern 12/24/15   Time 6   Period Weeks   Status New   PT LONG TERM GOAL #4   Title Independent in HEP 12/24/15   Time 6   Period Weeks   Status New   PT LONG TERM GOAL #5   Title Improve FOTO to </= 33% limitation 12/24/15   Time 6   Period Weeks   Status New               Plan - 11/12/15 0931    Clinical Impression Statement Patient presents with Rt Achille's tendinosis following an injury 10/29/15 when stepping down onto the Rt LE awkwardly. He has pain with  touch along the posterior heel; tightness and tenderness  to palpation through the Rt calf; antalgic gait; decreased ROM Rt ankle; decreased strenght Rt ankle/    Rehab Potential Good   PT Frequency 2x / week   PT Duration 6 weeks   PT Treatment/Interventions Patient/family education;ADLs/Self Care Home Management;Cryotherapy;Electrical Stimulation;Iontophoresis 4mg /ml Dexamethasone;Moist Heat;Ultrasound;Therapeutic activities;Therapeutic exercise   PT Next Visit Plan progress with ROM and strength Rt LE; modalities as indicated; joint mobs and PROm Rt ankle   Consulted and Agree with Plan of Care Patient      Patient will benefit from skilled therapeutic intervention in order to improve the following deficits and impairments:  Abnormal gait, Pain, Decreased range of motion, Decreased strength, Decreased activity tolerance  Visit Diagnosis: Pain in left ankle and joints of left foot - Plan: PT plan of care cert/re-cert  Other abnormalities of gait and mobility - Plan: PT plan of care cert/re-cert  Weakness generalized - Plan: PT plan of care cert/re-cert     Problem List Patient Active Problem List   Diagnosis Date Noted  . Left insertional Achilles tendinosis 11/05/2015  . 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 Obstructive sleep apnea on CPAP with periodic limb movement disorder 06/05/2014  . Obesity 05/02/2013  . Annual physical exam 04/04/2013  . Right knee pain 04/04/2013  . Degenerative disc disease, cervical 04/04/2013  . Hyperlipidemia 04/04/2013  . Lumbar degenerative disc disease 04/04/2013    Jonne Rote Rober Minion PT, MPH  11/12/2015, 12:00 PM  [facility] [address] [place], [place], [address] Phone: [phone]   Fax:  [phone]  Name: Brandon Riley MRN: [id number] Date of Birth: 1960-07-31

## 2015-11-12 NOTE — Patient Instructions (Signed)
Hamstring Step 1    Straighten left knee. Keep knee level with other knee or on bolster. Hold __30-60_ seconds. Relax knee by returning foot to start. Repeat _3__ times.  Outer Hip Stretch: Reclined IT Band Stretch (Strap)   Strap around one foot, pull leg across body until you feel a pull or stretch, with shoulders on mat. Hold for 30 seconds. Repeat 3 times each leg. 2-3 times/day.  Piriformis Stretch   Lying on back, pull right knee toward opposite shoulder. Hold 30 seconds. Repeat 3 times. Do 2-3 sessions per day.  Calf Stretch    Place hands on wall at shoulder height. Keeping back leg straight, bend front leg, feet pointing forward, heels flat on floor. Lean forward slightly until stretch is felt in calf of back leg. Hold stretch _30__ seconds, breathing slowly in and out. Repeat stretch with other leg back. Do _2__ sessions per day. Variation: Use chair or table for support.  Soleus Stretch - Standing    Stand with uninvolved leg behind, slightly bent, heel on floor. Lean into wall until stretch is felt in calf. Hold for _30__ seconds. Repeat on involved leg. Repeat _2__ times. Do __2_ times per day.  Ankle Pump    Bend ankles to move feet up and down, alternating feet. Repeat _10___ times. Do __1__ session first thing in morning.    [facility] [address] [place], [place] [address]  [phone] (office) [phone] (fax)

## 2015-11-16 ENCOUNTER — Encounter: Payer: Self-pay | Admitting: Physical Therapy

## 2015-11-16 ENCOUNTER — Ambulatory Visit (INDEPENDENT_AMBULATORY_CARE_PROVIDER_SITE_OTHER): Payer: BLUE CROSS/BLUE SHIELD | Admitting: Physical Therapy

## 2015-11-16 DIAGNOSIS — R531 Weakness: Secondary | ICD-10-CM

## 2015-11-16 DIAGNOSIS — R2689 Other abnormalities of gait and mobility: Secondary | ICD-10-CM | POA: Diagnosis not present

## 2015-11-16 DIAGNOSIS — M25571 Pain in right ankle and joints of right foot: Secondary | ICD-10-CM

## 2015-11-16 NOTE — Therapy (Signed)
[place], [place], [address] Phone: [phone]   Fax:  [phone]  Physical Therapy Treatment  Patient Details  Name: Brandon Riley MRN: [id number] Date of Birth: [birth year] Referring Provider: Dr. Dianah Field  Encounter Date: 11/16/2015      PT End of Session - 11/16/15 0804    Visit Number 2   Number of Visits 12   Date for PT Re-Evaluation 12/24/15   PT Start Time 0804   PT Stop Time 8527   PT Time Calculation (min) 43 min   Activity Tolerance Patient tolerated treatment well  some pain in ankle with weightbearing execises      Past Medical History  Diagnosis Date  . GERD (gastroesophageal reflux disease)   . Restless leg syndrome     Past Surgical History  Procedure Laterality Date  . Wisdom tooth extraction  2012    There were no vitals filed for this visit.      Subjective Assessment - 11/16/15 0804    Subjective Pt feels like the patch helped him, doing ok wtih his stretches.    Currently in Pain? Yes   Pain Score 2    Pain Location Heel   Pain Orientation Right   Pain Descriptors / Indicators Aching                         [facility] Adult PT Treatment/Exercise - 11/16/15 0001    Exercises   Exercises Ankle   Modalities   Modalities Ultrasound;Iontophoresis   Ultrasound   Ultrasound Location Rt achilles   Ultrasound Parameters 50%, 0.80 w/cm2  3.25mz, static x 4'   Ultrasound Goals Pain   Iontophoresis   Type of Iontophoresis Dexamethasone   Location Rt Achilles Tendon   Dose 1.0 cc, 120 mA patch   Time 8 hrs    Manual Therapy   Manual Therapy Soft tissue mobilization;Other (comment);Joint mobilization   Joint Mobilization Rt calcaneal rock, grade III , very little motion present   Soft tissue mobilization to Rt achilles cross friction massage   Other Manual Therapy ice massage   Ankle Exercises: Aerobic   Stationary Bike L2x5'   Ankle  Exercises: Stretches   Gastroc Stretch --  45 sec with prostretch, both sides   Ankle Exercises: Standing   SLS 3x30sec on black therapad, PRN hold for balance each side   Other Standing Ankle Exercises DF stretch Rt placing foot on 13" stool and FWD leans  didn't work him much so stopped after 5 reps   Other Standing Ankle Exercises BWD step ups on 8" step with heel taps Lt to work on [employer]                    PT Long Term Goals - 11/12/15 1155    PT LONG TERM GOAL #1   Title Increase ankle DF to 5-7 degrees 12/24/15   Time 6   Period Weeks   Status New   PT LONG TERM GOAL #2   Title 5-/5 to 5/5 strength Rt ankle 12/24/15   Time 6   Period Weeks   Status New   PT LONG TERM GOAL #3   Title Normal gait pattern 12/24/15   Time 6   Period Weeks   Status New   PT LONG TERM GOAL #4   Title Independent in HEP 12/24/15   Time 6   Period Weeks   Status New  PT LONG TERM GOAL #5   Title Improve FOTO to </= 33% limitation 12/24/15   Time 6   Period Weeks   Status New               Plan - 11/16/15 0847    Clinical Impression Statement This is Johns second visit, not goals met, doing well with HEP and tolerated ionto well.    Rehab Potential Good   PT Frequency 2x / week   PT Duration 6 weeks   PT Treatment/Interventions Patient/family education;ADLs/Self Care Home Management;Cryotherapy;Electrical Stimulation;Iontophoresis 32m/ml Dexamethasone;Moist Heat;Ultrasound;Therapeutic activities;Therapeutic exercise   PT Next Visit Plan progress with ROM and strength Rt LE; modalities as indicated; joint mobs and PROm Rt ankle   Consulted and Agree with Plan of Care Patient      Patient will benefit from skilled therapeutic intervention in order to improve the following deficits and impairments:  Abnormal gait, Pain, Decreased range of motion, Decreased strength, Decreased activity tolerance  Visit Diagnosis: Pain in right ankle and joints of right foot  Other  abnormalities of gait and mobility  Weakness generalized     Problem List Patient Active Problem List   Diagnosis Date Noted  . Left insertional Achilles tendinosis 11/05/2015  . 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 Obstructive sleep apnea on CPAP with periodic limb movement disorder 06/05/2014  . Obesity 05/02/2013  . Annual physical exam 04/04/2013  . Right knee pain 04/04/2013  . Degenerative disc disease, cervical 04/04/2013  . Hyperlipidemia 04/04/2013  . Lumbar degenerative disc disease 04/04/2013    SJeral PinchPT  11/16/2015, 8:49 AM  [facility] [address]Phone: [phone]  Fax:  [phone] Name: Brandon PodolakMRN: [id number] of Birth: 7March 17, 1962

## 2015-11-19 ENCOUNTER — Encounter: Payer: Self-pay | Admitting: Rehabilitative and Restorative Service Providers"

## 2015-11-19 ENCOUNTER — Ambulatory Visit (INDEPENDENT_AMBULATORY_CARE_PROVIDER_SITE_OTHER): Payer: BLUE CROSS/BLUE SHIELD | Admitting: Rehabilitative and Restorative Service Providers"

## 2015-11-19 DIAGNOSIS — R531 Weakness: Secondary | ICD-10-CM

## 2015-11-19 DIAGNOSIS — M25572 Pain in left ankle and joints of left foot: Secondary | ICD-10-CM

## 2015-11-19 DIAGNOSIS — R2689 Other abnormalities of gait and mobility: Secondary | ICD-10-CM

## 2015-11-19 DIAGNOSIS — M25571 Pain in right ankle and joints of right foot: Secondary | ICD-10-CM

## 2015-11-19 NOTE — Therapy (Signed)
[facility] [place] [address] [place], [place], [address] Phone: [phone]   Fax:  [phone]  Physical Therapy Treatment  Patient Details  Name: Reilley Latorre MRN: [id number] Date of Birth: Oct 10, 1960 Referring Provider: Dr. Benjamin Stain  Encounter Date: 11/19/2015      PT End of Session - 11/19/15 0850    Visit Number 3   Number of Visits 12   Date for PT Re-Evaluation 12/24/15   PT Start Time 0850   PT Stop Time 0933   PT Time Calculation (min) 43 min   Activity Tolerance Patient tolerated treatment well      Past Medical History:  Diagnosis Date  . GERD (gastroesophageal reflux disease)   . Restless leg syndrome     Past Surgical History:  Procedure Laterality Date  . WISDOM TOOTH EXTRACTION  2012    There were no vitals filed for this visit.      Subjective Assessment - 11/19/15 0851    Subjective Ankle is "still there" making some improvement. Feels the patch is helping. Doing exercises at home.    Currently in Pain? Yes   Pain Score 1    Pain Location Heel   Pain Orientation Right   Pain Descriptors / Indicators Aching   Pain Type Acute pain   Pain Onset 1 to 4 weeks ago   Pain Frequency Intermittent                         [facility] Adult PT Treatment/Exercise - 11/19/15 0001      Knee/Hip Exercises: Stretches   Gastroc Stretch Right;2 reps;30 seconds   Soleus Stretch Right;2 reps;30 seconds     Ultrasound   Ultrasound Location Rt achilles    Ultrasound Parameters 50%; 1.0 w/cm2; 1 mHz; 7 min      Iontophoresis   Type of Iontophoresis Dexamethasone   Location Rt Achilles Tendon   Dose 1.0 cc, 120 mA patch   Time 8 hrs      Manual Therapy   Manual Therapy Soft tissue mobilization;Other (comment);Joint mobilization   Joint Mobilization Rt calcaneal rock, grade III , very little motion present   Soft tissue mobilization to Rt achilles cross friction massage   Other Manual  Therapy ice massage     Ankle Exercises: Aerobic   Stationary Bike L5x 5'      Ankle Exercises: Standing   SLS 3x30sec on black therapad, PRN hold for balance each side  SLS Rt touch forward x10    Other Standing Ankle Exercises lateral heel touch 10 x2    Other Standing Ankle Exercises BWD step ups on 8" step with heel taps Lt to work on [employer]                     PT Long Term Goals - 11/19/15 0850      PT LONG TERM GOAL #1   Title Increase ankle DF to 5-7 degrees 12/24/15   Time 6   Period Weeks   Status On-going     PT LONG TERM GOAL #2   Title 5-/5 to 5/5 strength Rt ankle 12/24/15   Time 6   Period Weeks   Status On-going     PT LONG TERM GOAL #3   Title Normal gait pattern 12/24/15   Time 6   Period Weeks     PT LONG TERM GOAL #4   Title Independent in HEP 12/24/15   Time 6  Period Weeks   Status On-going     PT LONG TERM GOAL #5   Title Improve FOTO to </= 33% limitation 12/24/15   Time 6   Period Weeks   Status On-going               Plan - 11/19/15 0929    Clinical Impression Statement Less tenderness to palpation through the heel and improving mobility with joint mobs. Progressing gradually toward stated goals of therapy.    Rehab Potential Good   PT Frequency 2x / week   PT Duration 6 weeks   PT Treatment/Interventions Patient/family education;ADLs/Self Care Home Management;Cryotherapy;Electrical Stimulation;Iontophoresis 4mg /ml Dexamethasone;Moist Heat;Ultrasound;Therapeutic activities;Therapeutic exercise   PT Next Visit Plan progress with ROM and strength Rt LE; modalities as indicated; joint mobs and PROm Rt ankle      Patient will benefit from skilled therapeutic intervention in order to improve the following deficits and impairments:  Abnormal gait, Pain, Decreased range of motion, Decreased strength, Decreased activity tolerance  Visit Diagnosis: Other abnormalities of gait and mobility  Weakness generalized  Pain in right  ankle and joints of right foot  Pain in left ankle and joints of left foot     Problem List Patient Active Problem List   Diagnosis Date Noted  . Left insertional Achilles tendinosis 11/05/2015  . 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 Obstructive sleep apnea on CPAP with periodic limb movement disorder 06/05/2014  . Obesity 05/02/2013  . Annual physical exam 04/04/2013  . Right knee pain 04/04/2013  . Degenerative disc disease, cervical 04/04/2013  . Hyperlipidemia 04/04/2013  . Lumbar degenerative disc disease 04/04/2013    Carold Eisner Rober Minion  PT, MPH  11/19/2015, 9:33 AM  [facility] [address] [place], [place], [address] Phone: [phone]   Fax:  [phone]  Name: Jerme Arrison MRN: [id number] Date of Birth: [birth year]

## 2015-11-23 ENCOUNTER — Ambulatory Visit (INDEPENDENT_AMBULATORY_CARE_PROVIDER_SITE_OTHER): Payer: BLUE CROSS/BLUE SHIELD | Admitting: Rehabilitative and Restorative Service Providers"

## 2015-11-23 ENCOUNTER — Encounter: Payer: Self-pay | Admitting: Rehabilitative and Restorative Service Providers"

## 2015-11-23 DIAGNOSIS — R2689 Other abnormalities of gait and mobility: Secondary | ICD-10-CM | POA: Diagnosis not present

## 2015-11-23 DIAGNOSIS — R531 Weakness: Secondary | ICD-10-CM | POA: Diagnosis not present

## 2015-11-23 DIAGNOSIS — M25571 Pain in right ankle and joints of right foot: Secondary | ICD-10-CM | POA: Diagnosis not present

## 2015-11-23 DIAGNOSIS — M25572 Pain in left ankle and joints of left foot: Secondary | ICD-10-CM | POA: Diagnosis not present

## 2015-11-23 NOTE — Therapy (Signed)
[facility] [place] [address] [place], [place], [address] Phone: [phone]   Fax:  [phone]  Physical Therapy Treatment  Patient Details  Name: Brandon Riley MRN: [id number] Date of Birth: 1960/06/25 Referring Provider: Dr. Benjamin Stain  Encounter Date: 11/23/2015      PT End of Session - 11/23/15 0803    Visit Number 4   Number of Visits 12   Date for PT Re-Evaluation 12/24/15   PT Start Time 0803   PT Stop Time 0850   PT Time Calculation (min) 47 min   Activity Tolerance Patient tolerated treatment well      Past Medical History:  Diagnosis Date  . GERD (gastroesophageal reflux disease)   . Restless leg syndrome     Past Surgical History:  Procedure Laterality Date  . WISDOM TOOTH EXTRACTION  2012    There were no vitals filed for this visit.      Subjective Assessment - 11/23/15 0805    Subjective Ankle seems to be better than it was when he first started therapy. Still has some pain and limitations that he notices on a daily basis. Discomfort "here and there in the ankle" but it doesn't stop him    Currently in Pain? Yes   Pain Score 2    Pain Location Heel   Pain Orientation Right   Pain Descriptors / Indicators Aching   Pain Type Acute pain   Pain Onset 1 to 4 weeks ago   Pain Frequency Intermittent                         [facility] Adult PT Treatment/Exercise - 11/23/15 0001      Knee/Hip Exercises: Stretches   Gastroc Stretch Right;30 seconds;3 reps   Soleus Stretch Right;30 seconds;3 reps     Knee/Hip Exercises: Aerobic   Elliptical trial of elliptical L2 x 3 min - created knee pain - stopped      Knee/Hip Exercises: Standing   Heel Raises 2 sets;20 reps  Rt LE only - +1 handhold for balance      Ultrasound   Ultrasound Location Rt achilles/heel   Ultrasound Parameters 50%; 1.0w/cm2; 1 mHz; 7 min    Ultrasound Goals Pain;Edema     Iontophoresis   Type of Iontophoresis  Dexamethasone   Location Rt Achilles Tendon   Dose 1.0 cc, 120 mA patch   Time 8 hrs      Manual Therapy   Manual Therapy Soft tissue mobilization;Other (comment);Joint mobilization   Joint Mobilization Rt calcaneal rock, grade III , very little motion present   Soft tissue mobilization to Rt achilles cross friction massage   Other Manual Therapy ice massage     Ankle Exercises: Standing   Vector Stance --  door/gray pad opposite leg swings fwd/back/sidex30   SLS 3x30sec on black therapad, PRN hold for balance each side  SLS Rt touch forward x10    Other Standing Ankle Exercises lateral heel touch 10 x2    Other Standing Ankle Exercises BWD step ups on 8" step with heel taps Lt to work on [employer]                     PT Long Term Goals - 11/19/15 0850      PT LONG TERM GOAL #1   Title Increase ankle DF to 5-7 degrees 12/24/15   Time 6   Period Weeks   Status On-going  PT LONG TERM GOAL #2   Title 5-/5 to 5/5 strength Rt ankle 12/24/15   Time 6   Period Weeks   Status On-going     PT LONG TERM GOAL #3   Title Normal gait pattern 12/24/15   Time 6   Period Weeks     PT LONG TERM GOAL #4   Title Independent in HEP 12/24/15   Time 6   Period Weeks   Status On-going     PT LONG TERM GOAL #5   Title Improve FOTO to </= 33% limitation 12/24/15   Time 6   Period Weeks   Status On-going               Plan - 11/23/15 0804    Clinical Impression Statement Continued gradual improvement in symptoms    Rehab Potential Good   PT Frequency 2x / week   PT Duration 6 weeks   PT Treatment/Interventions Patient/family education;ADLs/Self Care Home Management;Cryotherapy;Electrical Stimulation;Iontophoresis 4mg /ml Dexamethasone;Moist Heat;Ultrasound;Therapeutic activities;Therapeutic exercise   PT Next Visit Plan progress with ROM and strength Rt LE; modalities as indicated; joint mobs and PROm Rt ankle   Consulted and Agree with Plan of Care Patient       Patient will benefit from skilled therapeutic intervention in order to improve the following deficits and impairments:     Visit Diagnosis: Other abnormalities of gait and mobility  Weakness generalized  Pain in right ankle and joints of right foot  Pain in left ankle and joints of left foot     Problem List Patient Active Problem List   Diagnosis Date Noted  . Left insertional Achilles tendinosis 11/05/2015  . 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 Obstructive sleep apnea on CPAP with periodic limb movement disorder 06/05/2014  . Obesity 05/02/2013  . Annual physical exam 04/04/2013  . Right knee pain 04/04/2013  . Degenerative disc disease, cervical 04/04/2013  . Hyperlipidemia 04/04/2013  . Lumbar degenerative disc disease 04/04/2013    Krystale Rinkenberger Rober Minion PT, MPH 11/23/2015, 8:44 AM  [facility] [address] [place], [place], [address] Phone: [phone]   Fax:  [phone]  Name: Brandon Riley MRN: [id number] Date of Birth: [birth year]

## 2015-12-03 ENCOUNTER — Ambulatory Visit (INDEPENDENT_AMBULATORY_CARE_PROVIDER_SITE_OTHER): Payer: BLUE CROSS/BLUE SHIELD | Admitting: Physical Therapy

## 2015-12-03 DIAGNOSIS — R2689 Other abnormalities of gait and mobility: Secondary | ICD-10-CM

## 2015-12-03 DIAGNOSIS — M25571 Pain in right ankle and joints of right foot: Secondary | ICD-10-CM | POA: Diagnosis not present

## 2015-12-03 DIAGNOSIS — R531 Weakness: Secondary | ICD-10-CM | POA: Diagnosis not present

## 2015-12-03 NOTE — Therapy (Signed)
[place], [place], [address] Phone: [phone]   Fax:  [phone]  Physical Therapy Treatment  Patient Details  Name: Brandon Riley MRN: [id number] Date of Birth: 1961/01/03 Referring Provider: Dr. Helane Rima  Encounter Date: 12/03/2015      PT End of Session - 12/03/15 0846    Visit Number 5   Number of Visits 12   Date for PT Re-Evaluation 12/24/15   PT Start Time 0800   PT Stop Time 0843   PT Time Calculation (min) 43 min   Activity Tolerance Patient tolerated treatment well      Past Medical History:  Diagnosis Date  . GERD (gastroesophageal reflux disease)   . Restless leg syndrome     Past Surgical History:  Procedure Laterality Date  . WISDOM TOOTH EXTRACTION  2012    There were no vitals filed for this visit.      Subjective Assessment - 12/03/15 0802    Subjective Pt reports over the weekend he had some burning in his Rt heel first thing in morning and end of day.  He is still stretching first thing in morning .   Currently in Pain? Yes   Pain Score 1    Pain Location Heel   Pain Orientation Right   Aggravating Factors  worse without shoes and first thing in morning   Pain Relieving Factors better with shoes             [facility] PT Assessment - 12/03/15 0001      Assessment   Medical Diagnosis Rt Achilles tendinosis   Referring Provider Dr. Helane Rima   Onset Date/Surgical Date 10/29/15   Hand Dominance Right   Next MD Visit 12/17/15     AROM   Right Ankle Dorsiflexion 7   Right Ankle Plantar Flexion 48   Right Ankle Inversion 34   Right Ankle Eversion 21   Left Ankle Dorsiflexion 3          [facility] Adult PT Treatment/Exercise - 12/03/15 0001      Knee/Hip Exercises: Stretches   Gastroc Stretch Right;30 seconds;Left;3 reps   Soleus Stretch Right;30 seconds;Left;3 reps     Knee/Hip Exercises: Aerobic   Nustep L5: 5.5 min      Ultrasound   Ultrasound  Location Rt medial achilles tendon (distal portion)    Ultrasound Parameters 100%, 3.3 mHz, 1.0 w/cm2   Ultrasound Goals Pain     Manual Therapy   Manual Therapy Taping;Myofascial release;Soft tissue mobilization   Manual therapy comments Rock tape to decompress tissue, decrease pain, increase proprioceptive awareness of ankle.  I strips applied to Rt plantar fascia, posterior tib, perpendicular to plantar fascia at heel.    Soft tissue mobilization to Rt achilles cross friction massage   Myofascial Release to Rt plantar fascia, Rt gastroc /soleus, posterior tib              PT Long Term Goals - 12/03/15 0856      PT LONG TERM GOAL #1   Title Increase ankle DF to 5-7 degrees 12/24/15   Time 6   Period Weeks   Status Partially Met  Rt 7 deg, Lt 3 deg     PT LONG TERM GOAL #2   Title 5-/5 to 5/5 strength Rt ankle 12/24/15   Time 6   Period Weeks   Status On-going     PT LONG TERM GOAL #3   Title Normal gait pattern 12/24/15   Time  6   Period Weeks   Status On-going  improving     PT LONG TERM GOAL #4   Title Independent in HEP 12/24/15   Time 6   Period Weeks   Status On-going     PT LONG TERM GOAL #5   Title Improve FOTO to </= 33% limitation 12/24/15   Time 6   Period Weeks   Status On-going               Plan - 12/03/15 0857    Clinical Impression Statement Pt demonstrated improved Rt ankle DF, has partially met LTG # 1.  Pt reported decrease in pain after manual therapy and application of Rock tape (trial).  Progressing towards goals.    Rehab Potential Good   PT Frequency 2x / week   PT Duration 6 weeks   PT Treatment/Interventions Patient/family education;ADLs/Self Care Home Management;Cryotherapy;Electrical Stimulation;Iontophoresis 87m/ml Dexamethasone;Moist Heat;Ultrasound;Therapeutic activities;Therapeutic exercise   PT Next Visit Plan Assess response to tape; apply if positive response. Progress HEP as tolerated.    Consulted and Agree with Plan  of Care Patient      Patient will benefit from skilled therapeutic intervention in order to improve the following deficits and impairments:  Abnormal gait, Pain, Decreased range of motion, Decreased strength, Decreased activity tolerance  Visit Diagnosis: Other abnormalities of gait and mobility  Weakness generalized  Pain in right ankle and joints of right foot     Problem List Patient Active Problem List   Diagnosis Date Noted  . Left insertional Achilles tendinosis 11/05/2015  . 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 Obstructive sleep apnea on CPAP with periodic limb movement disorder 06/05/2014  . Obesity 05/02/2013  . Annual physical exam 04/04/2013  . Right knee pain 04/04/2013  . Degenerative disc disease, cervical 04/04/2013  . Hyperlipidemia 04/04/2013  . Lumbar degenerative disc disease 04/04/2013   JKerin Perna PTA 12/03/15 9:06 AM  [place] [address]Phone: [phone]  Fax:  [phone] Name: Brandon MagallonMRN: [id number] of Birth: 7Feb 28, 1962

## 2015-12-07 ENCOUNTER — Encounter: Payer: BLUE CROSS/BLUE SHIELD | Admitting: Physical Therapy

## 2015-12-10 ENCOUNTER — Ambulatory Visit (INDEPENDENT_AMBULATORY_CARE_PROVIDER_SITE_OTHER): Payer: BLUE CROSS/BLUE SHIELD | Admitting: Physical Therapy

## 2015-12-10 ENCOUNTER — Encounter: Payer: Self-pay | Admitting: Sports Medicine

## 2015-12-10 DIAGNOSIS — R531 Weakness: Secondary | ICD-10-CM | POA: Diagnosis not present

## 2015-12-10 DIAGNOSIS — R2689 Other abnormalities of gait and mobility: Secondary | ICD-10-CM

## 2015-12-10 DIAGNOSIS — M25571 Pain in right ankle and joints of right foot: Secondary | ICD-10-CM | POA: Diagnosis not present

## 2015-12-10 NOTE — Therapy (Signed)
[place], [place], [address] Phone: [phone]   Fax:  [phone]  Physical Therapy Treatment  Patient Details  Name: Brandon Riley MRN: [id number] Date of Birth: [birth year] Referring Provider: Dr. Helane Rima  Encounter Date: 12/10/2015      PT End of Session - 12/10/15 0825    Visit Number 6   Number of Visits 12   Date for PT Re-Evaluation 12/24/15   PT Start Time 0802   PT Stop Time 2951   PT Time Calculation (min) 41 min   Activity Tolerance Patient tolerated treatment well;No increased pain      Past Medical History:  Diagnosis Date  . GERD (gastroesophageal reflux disease)   . Restless leg syndrome     Past Surgical History:  Procedure Laterality Date  . WISDOM TOOTH EXTRACTION  2012    There were no vitals filed for this visit.      Subjective Assessment - 12/10/15 0806    Subjective Pt states he had some increased pain in Rt heel over weekend, but he also had 2- 16 hour work days.  He notices wearing shoes helps.  He stretches each morning.  Using nitro patch very occasionally.  Overall feels things are improving.     Currently in Pain? No/denies   Pain Score 0-No pain            [facility] PT Assessment - 12/10/15 0001      Assessment   Medical Diagnosis Rt Achilles tendinosis   Referring Provider Dr. Helane Rima   Onset Date/Surgical Date 10/29/15   Hand Dominance Right   Next MD Visit 12/17/15     AROM   Right Ankle Dorsiflexion 7   Left Ankle Dorsiflexion 5     Flexibility   Hamstrings Rt 66 deg,   Lt 65 deg            [facility] Adult PT Treatment/Exercise - 12/10/15 0001      Knee/Hip Exercises: Stretches   Passive Hamstring Stretch Right;Left 3 reps;60 seconds   Gastroc Stretch Right;30 seconds;Left;3 reps   Soleus Stretch Right;30 seconds;Left;3 reps     Knee/Hip Exercises: Aerobic   Nustep L5: 5.5 min      Knee/Hip Exercises: Standing   Heel Raises  Both;2 sets;10 reps  eccentric lowering    SLS on Blue pad:  Rt/Lt SLS with occasional UE support x 20 sec      Ultrasound   Ultrasound Location Rt medial Achilles tendon.    Ultrasound Parameters 100%, 3.3 mHz, 1.0 w/cm2 x 8 min    Ultrasound Goals Pain     Manual Therapy   Soft tissue mobilization to Rt achilles cross friction massage   Myofascial Release to Rt plantar fascia, Rt gastroc /soleus, posterior tib              PT Long Term Goals - 12/10/15 0931      PT LONG TERM GOAL #1   Title Increase ankle DF to 5-7 degrees 12/24/15   Time 6   Period Weeks   Status Achieved     PT LONG TERM GOAL #2   Title 5-/5 to 5/5 strength Rt ankle 12/24/15   Time 6   Period Weeks   Status On-going     PT LONG TERM GOAL #3   Title Normal gait pattern 12/24/15   Time 6   Period Weeks   Status On-going     PT LONG TERM GOAL #4  Title Independent in HEP 12/24/15   Time 6   Period Weeks   Status On-going     PT LONG TERM GOAL #5   Title Improve FOTO to </= 33% limitation 12/24/15   Time 6   Period Weeks   Status On-going               Plan - 12/10/15 6812    Clinical Impression Statement Pt demonstrated no change in Rt ankle DF, but slight improvement in Lt ankle DF ROM. Has met LTG #1.  Pt's pain in Rt heel is gradually decreasing.  Pt tolerated all exercises well, without increase in pain.  Progressing towards unmet goals.    Rehab Potential Good   PT Frequency 2x / week   PT Duration 6 weeks   PT Treatment/Interventions Patient/family education;ADLs/Self Care Home Management;Cryotherapy;Electrical Stimulation;Iontophoresis 69m/ml Dexamethasone;Moist Heat;Ultrasound;Therapeutic activities;Therapeutic exercise   PT Next Visit Plan  Progress HEP as tolerated. [facility] MD note for upcoming appt.    PT Home Exercise Plan Pt to stretch ankle when "burning" sensation happens at end of day, followed by ice massage to area for 3 min.  Pt verbalized understanding.    Consulted  and Agree with Plan of Care Patient      Patient will benefit from skilled therapeutic intervention in order to improve the following deficits and impairments:  Abnormal gait, Pain, Decreased range of motion, Decreased strength, Decreased activity tolerance  Visit Diagnosis: Other abnormalities of gait and mobility  Weakness generalized  Pain in right ankle and joints of right foot     Problem List Patient Active Problem List   Diagnosis Date Noted  . Left insertional Achilles tendinosis 11/05/2015  . 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 Obstructive sleep apnea on CPAP with periodic limb movement disorder 06/05/2014  . Obesity 05/02/2013  . Annual physical exam 04/04/2013  . Right knee pain 04/04/2013  . Degenerative disc disease, cervical 04/04/2013  . Hyperlipidemia 04/04/2013  . Lumbar degenerative disc disease 04/04/2013   JKerin Perna PTA 12/10/15 9:32 AM  [place] [address]Phone: [phone]  Fax:  [phone] Name: JBrinden KincheloeMRN: [id number] of Birth: 704-Aug-1962

## 2015-12-12 ENCOUNTER — Encounter: Payer: Self-pay | Admitting: Sports Medicine

## 2015-12-14 ENCOUNTER — Encounter: Payer: Self-pay | Admitting: Rehabilitative and Restorative Service Providers"

## 2015-12-14 ENCOUNTER — Ambulatory Visit (INDEPENDENT_AMBULATORY_CARE_PROVIDER_SITE_OTHER): Payer: BLUE CROSS/BLUE SHIELD | Admitting: Rehabilitative and Restorative Service Providers"

## 2015-12-14 DIAGNOSIS — R2689 Other abnormalities of gait and mobility: Secondary | ICD-10-CM

## 2015-12-14 DIAGNOSIS — M25571 Pain in right ankle and joints of right foot: Secondary | ICD-10-CM

## 2015-12-14 DIAGNOSIS — R531 Weakness: Secondary | ICD-10-CM | POA: Diagnosis not present

## 2015-12-14 DIAGNOSIS — M25572 Pain in left ankle and joints of left foot: Secondary | ICD-10-CM

## 2015-12-14 NOTE — Therapy (Addendum)
[facility] [address] [place],  [place], [address] Phone: [phone]   Fax:  [phone]  Physical Therapy Treatment  Patient Details  Name: Brandon Riley MRN: [id number] Date of Birth: 09/20/1960 Referring Provider: Dr. Thekkekandam  Encounter Date: 12/14/2015      PT End of Session - 12/14/15 0806    Visit Number 7   Number of Visits 12   Date for PT Re-Evaluation 12/24/15   PT Start Time 0803   PT Stop Time 0852   PT Time Calculation (min) 49 min   Activity Tolerance Patient tolerated treatment well      Past Medical History:  Diagnosis Date  . GERD (gastroesophageal reflux disease)   . Restless leg syndrome     Past Surgical History:  Procedure Laterality Date  . WISDOM TOOTH EXTRACTION  2012    There were no vitals filed for this visit.      Subjective Assessment - 12/14/15 0810    Subjective Patient reports that he has improved since beginning PT. He is doing his exercises at home and notices that he can walk without a limp. The pain has subsided and he is pleased with his progress.    Currently in Pain? No/denies            [facility] PT Assessment - 12/14/15 0001      Assessment   Medical Diagnosis Rt Achilles tendinosis   Referring Provider Dr. Thekkekandam   Onset Date/Surgical Date 10/29/15   Hand Dominance Right   Next MD Visit 12/17/15     Observation/Other Assessments   Focus on Therapeutic Outcomes (FOTO)  17% limitation      AROM   Right Ankle Dorsiflexion 7   Right Ankle Plantar Flexion 48   Right Ankle Inversion 34   Right Ankle Eversion 21   Left Ankle Dorsiflexion 6     Flexibility   Hamstrings Rt 66 deg,   Lt 65 deg                      [facility] Adult PT Treatment/Exercise - 12/14/15 0001      Knee/Hip Exercises: Stretches   Gastroc Stretch Right;30 seconds;Left;3 reps   Soleus Stretch Right;30 seconds;Left;3 reps     Knee/Hip Exercises: Aerobic   Nustep  L5: 5 min      Ultrasound   Ultrasound Location Rt medial achilles tendon   Ultrasound Parameters 100%; 3.3mHz; 10 w/cm2; 8 min    Ultrasound Goals Pain     Manual Therapy   Soft tissue mobilization Rt achilles tendon area; Rt calf - gastroc/soleus    Myofascial Release to Rt plantar fascia, Rt gastroc /soleus, posterior tib            Trigger Point Dry Needling - 12/14/15 0841    Consent Given? Yes   Education Handout Provided Yes   Muscles Treated Lower Body --  Rt calf    Gastrocnemius Response Twitch response elicited;Palpable increased muscle length   Soleus Response Twitch response elicited;Palpable increased muscle length              PT Education - 12/14/15 0841    Education provided Yes   Education Details TDN HEP importance of continuing with consistent HEP    Person(s) Educated Patient   Methods Explanation   Comprehension Verbalized understanding             PT Long Term Goals - 12/14/15 0807      PT   LONG TERM GOAL #1   Title Increase ankle DF to 5-7 degrees 12/24/15   Time 6   Period Weeks   Status Achieved     PT LONG TERM GOAL #2   Title 5-/5 to 5/5 strength Rt ankle 12/24/15   Time 6   Period Weeks   Status Achieved     PT LONG TERM GOAL #3   Title Normal gait pattern 12/24/15   Time 6   Period Weeks   Status Achieved     PT LONG TERM GOAL #4   Title Independent in HEP 12/24/15   Time 6   Period Weeks   Status Achieved     PT LONG TERM GOAL #5   Title Improve FOTO to </= 33% limitation 12/24/15   Time 6   Period Weeks   Status Achieved               Plan - 12/14/15 3716    Clinical Impression Statement Patient has progressed wiell with rehab. He has accomplished goals of therapy. Patient continues to have some discomfort at times related to activity level. He knows he needs to continue with his exercises  Returns to MD Monday. Will call to schedule additional PT appointments if needed    Rehab Potential Good   PT  Frequency 2x / week   PT Duration 6 weeks   PT Treatment/Interventions Patient/family education;ADLs/Self Care Home Management;Cryotherapy;Electrical Stimulation;Iontophoresis 41m/ml Dexamethasone;Moist Heat;Ultrasound;Therapeutic activities;Therapeutic exercise   PT Next Visit Plan Anticipate D/C Pt will call if he needs to schedule additioinal appointments in the next month   Consulted and Agree with Plan of Care Patient      Patient will benefit from skilled therapeutic intervention in order to improve the following deficits and impairments:  Abnormal gait, Pain, Decreased range of motion, Decreased strength, Decreased activity tolerance  Visit Diagnosis: Other abnormalities of gait and mobility  Weakness generalized  Pain in right ankle and joints of right foot  Pain in left ankle and joints of left foot     Problem List Patient Active Problem List   Diagnosis Date Noted  . Left insertional Achilles tendinosis 11/05/2015  . 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 Obstructive sleep apnea on CPAP with periodic limb movement disorder 06/05/2014  . Obesity 05/02/2013  . Annual physical exam 04/04/2013  . Right knee pain 04/04/2013  . Degenerative disc disease, cervical 04/04/2013  . Hyperlipidemia 04/04/2013  . Lumbar degenerative disc disease 04/04/2013    Aviela Blundell PNilda SimmerPT, MPH  12/14/2015, 8:45 AM  [facility] [address]Phone: [phone]  Fax:  [phone] Name: Brandon LowrimoreMRN: [id number] of Birth: 7Jun 29, 1962 PHYSICAL THERAPY DISCHARGE SUMMARY  Visits from Start of Care: 7  Current functional level related to goals / functional outcomes: See last progress note for discharge status. Current status unknown   Remaining deficits: Continued tightness through bilat LE's at d/c   Education / Equipment: HEP Plan: Patient agrees to discharge.  Patient goals were  partially met. Patient is being discharged due to being pleased with the current functional level.  ?????    Philis Doke P. HHelene KelpPT, MPH 03/24/16 9:51 AM

## 2015-12-14 NOTE — Patient Instructions (Signed)

## 2015-12-17 ENCOUNTER — Ambulatory Visit (INDEPENDENT_AMBULATORY_CARE_PROVIDER_SITE_OTHER): Payer: BLUE CROSS/BLUE SHIELD | Admitting: Sports Medicine

## 2015-12-17 DIAGNOSIS — F411 Generalized anxiety disorder: Secondary | ICD-10-CM

## 2015-12-17 DIAGNOSIS — M7662 Achilles tendinitis, left leg: Secondary | ICD-10-CM

## 2015-12-17 DIAGNOSIS — F0631 Mood disorder due to known physiological condition with depressive features: Secondary | ICD-10-CM | POA: Insufficient documentation

## 2015-12-17 DIAGNOSIS — F39 Unspecified mood [affective] disorder: Secondary | ICD-10-CM | POA: Insufficient documentation

## 2015-12-17 MED ORDER — CITALOPRAM HYDROBROMIDE 10 MG PO TABS: 5.0000 mg | ORAL_TABLET | Freq: Every day | ORAL | 3 refills | Status: DC

## 2015-12-17 NOTE — Assessment & Plan Note (Signed)
Improved significantly, he still does have some discomfort so I have advised that he continue his nitroglycerin patches for another month.

## 2015-12-17 NOTE — Progress Notes (Signed)
  Subjective:    CC: R Achilles tendinosis follow-up  HPI: 55 yo presenting for 6 week follow-up for R Achilles tendinosis.  He says pain has nearly completely resolved with physical therapy.  He had used topical nitroglycerin patches for a while but does not find them necessary anymore.  He is happy with how his ankle feels at this time.  He plans to continue PT exercises at home to prevent further flares.    He also mentions that he continues to be anxious and fatigued, especially surrounding work.  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  He is also thinking of a career change. He was previously on Citalopram 20mg  but said it made him too apathetic so he discontinued this medication.  He has sleep apnea and is compliant with C-pap.   Past medical history, Surgical history, Family history not pertinant except as noted below, Social history, Allergies, and medications have been entered into the medical record, reviewed, and no changes needed.   Review of Systems: No fevers, chills, night sweats, weight loss, chest pain, or shortness of breath.   Objective:    General: Well Developed, well nourished, and in no acute distress.  Neuro: Alert and oriented x3, extra-ocular muscles intact, sensation grossly intact.  HEENT: Normocephalic, atraumatic, pupils equal round reactive to light, neck supple, no masses, no lymphadenopathy, thyroid nonpalpable.  Skin: Warm and dry, no rashes. Cardiac: Regular rate and rhythm, no murmurs rubs or gallops, no lower extremity edema.  Respiratory: Clear to auscultation bilaterally. Not using accessory muscles, speaking in full sentences. R Ankle: No visible erythema or swelling. Range of motion is full in all directions. Strength is 5/5 in all directions. Stable lateral and medial ligaments; squeeze test and kleiger test unremarkable; Talar dome nontender; No pain at base of 5th MT; No tenderness  over cuboid; No tenderness over N spot or navicular prominence No tenderness on posterior aspects of lateral and medial malleolus No sign of peroneal tendon subluxations or tenderness to palpation Negative tarsal tunnel tinel's Able to walk 4 steps.    Impression and Recommendations:    1. R Achilles tendonitis: nearly fully resolved  -Continue PT exercises and nitroglycerin   2. GAD -Will start on 5mg  Citalopram daily  -Return in 1 month for re-check

## 2015-12-17 NOTE — Assessment & Plan Note (Signed)
20 mg of citalopram created apathy, restarting this at 5 mg. He did have a normal testosterone, TSH, and he has well treated sleep apnea. If insufficient improvement with low-dose citalopram, which we may need to a taper, then we will consider referral to integrative medicine.

## 2016-01-14 ENCOUNTER — Ambulatory Visit (INDEPENDENT_AMBULATORY_CARE_PROVIDER_SITE_OTHER): Payer: BLUE CROSS/BLUE SHIELD | Admitting: Sports Medicine

## 2016-01-14 DIAGNOSIS — F411 Generalized anxiety disorder: Secondary | ICD-10-CM

## 2016-01-14 DIAGNOSIS — M7662 Achilles tendinitis, left leg: Secondary | ICD-10-CM

## 2016-01-14 DIAGNOSIS — Z23 Encounter for immunization: Secondary | ICD-10-CM

## 2016-01-14 NOTE — Progress Notes (Signed)
  Subjective:    CC: f/u L Achille's tendenosis and anxiety   HPI: 55 yo here for follow-up for Achille's tendenosis and anxitey.  Achille's tendonitis has completely resolved with use of nitro patches.  Now asymptomatic without using patches.  He has also been taking Citalopram 5mg  for the past month, which has tremendously helped with his anxiety.  He has not noticed any side effects - has not been apathetic.  He has no complaints at this time.   Past medical history, Surgical history, Family history not pertinant except as noted below, Social history, Allergies, and medications have been entered into the medical record, reviewed, and no changes needed.   Review of Systems: No fevers, chills, night sweats, weight loss, chest pain, or shortness of breath.   Objective:    General: Well Developed, well nourished, and in no acute distress.  Neuro: Alert and oriented x3, extra-ocular muscles intact, sensation grossly intact.  HEENT: Normocephalic, atraumatic, pupils equal round reactive to light, neck supple, no masses, no lymphadenopathy, thyroid nonpalpable.  Skin: Warm and dry, no rashes. Cardiac: Regular rate and rhythm, no murmurs rubs or gallops, no lower extremity edema.  Respiratory: Clear to auscultation bilaterally. Not using accessory muscles, speaking in full sentences.   Impression and Recommendations:    1. L Achilles tendonosis: completely resolved.  -May continue nitro patches PRN -follow-up in symptoms recur   2. GAD: doing well on Citalopram 5mg .  GAD-7 score [id number].   -Continue Citalopram 5mg  daily.  If work becomes less stressful, can come off of Citalopram and resume if work becomes stressful again.

## 2016-01-14 NOTE — Assessment & Plan Note (Signed)
May continue nitroglycerin patches if desired however symptoms have resolved.

## 2016-01-21 ENCOUNTER — Encounter: Payer: Self-pay | Admitting: Sports Medicine

## 2016-01-21 DIAGNOSIS — F411 Generalized anxiety disorder: Secondary | ICD-10-CM

## 2016-01-21 MED ORDER — CITALOPRAM HYDROBROMIDE 10 MG PO TABS: 5.0000 mg | ORAL_TABLET | Freq: Every day | ORAL | 11 refills | Status: DC

## 2016-01-22 ENCOUNTER — Encounter: Payer: Self-pay | Admitting: Sports Medicine

## 2016-01-24 ENCOUNTER — Encounter: Payer: Self-pay | Admitting: Sports Medicine

## 2016-03-24 ENCOUNTER — Other Ambulatory Visit: Payer: Self-pay

## 2016-03-24 MED ORDER — TRAZODONE HCL 50 MG PO TABS: 25.0000 mg | ORAL_TABLET | Freq: Every day | ORAL | 3 refills | Status: DC

## 2016-03-25 ENCOUNTER — Encounter: Payer: Self-pay | Admitting: Sports Medicine

## 2016-03-28 ENCOUNTER — Telehealth: Payer: Self-pay

## 2016-03-28 DIAGNOSIS — F411 Generalized anxiety disorder: Secondary | ICD-10-CM

## 2016-03-28 NOTE — Telephone Encounter (Signed)
Patient was seen in September and was advised to continue taking Celexa 5 mg tablets. We sent in Celexa 10 mg tablets to take 0.5 tablets once daily. He has not been taking a half tablet daily but a whole tablet daily. He needs a refill early due to taking it different than the sig. He reports feeling better on the 10 mg and his wife even likes him now. Please advise.

## 2016-03-28 NOTE — Telephone Encounter (Signed)
Okay to give him a 90 day supply of whatever dose he is requesting.

## 2016-04-01 MED ORDER — CITALOPRAM HYDROBROMIDE 10 MG PO TABS: 10.0000 mg | ORAL_TABLET | Freq: Every day | ORAL | 1 refills | Status: DC

## 2016-04-01 NOTE — Telephone Encounter (Signed)
Left message advising patient

## 2016-04-29 ENCOUNTER — Other Ambulatory Visit: Payer: Self-pay | Admitting: Sports Medicine

## 2016-09-29 ENCOUNTER — Ambulatory Visit (INDEPENDENT_AMBULATORY_CARE_PROVIDER_SITE_OTHER): Payer: Managed Care, Other (non HMO) | Admitting: Physician Assistant

## 2016-09-29 ENCOUNTER — Ambulatory Visit (INDEPENDENT_AMBULATORY_CARE_PROVIDER_SITE_OTHER): Payer: Managed Care, Other (non HMO)

## 2016-09-29 VITALS — BP 167/113 | HR 78 | Wt 233.0 lb

## 2016-09-29 DIAGNOSIS — S6991XA Unspecified injury of right wrist, hand and finger(s), initial encounter: Secondary | ICD-10-CM

## 2016-09-29 DIAGNOSIS — S62111A Displaced fracture of triquetrum [cuneiform] bone, right wrist, initial encounter for closed fracture: Secondary | ICD-10-CM | POA: Insufficient documentation

## 2016-09-29 DIAGNOSIS — M19031 Primary osteoarthritis, right wrist: Secondary | ICD-10-CM | POA: Diagnosis not present

## 2016-09-29 DIAGNOSIS — M25531 Pain in right wrist: Secondary | ICD-10-CM | POA: Diagnosis not present

## 2016-09-29 MED ORDER — MELOXICAM 15 MG PO TABS: 15.0000 mg | ORAL_TABLET | Freq: Every day | ORAL | 1 refills | Status: DC

## 2016-09-29 MED ORDER — TRAMADOL HCL 50 MG PO TABS: 50.0000 mg | ORAL_TABLET | Freq: Three times a day (TID) | ORAL | 0 refills | Status: DC | PRN

## 2016-09-29 NOTE — Patient Instructions (Addendum)
- Ice x 20 mins, 3-4 times daily (do not apply ice directly to skin. Place a light towel/cloth in between) - Take Meloxicam once daily with food for the next week, then daily as needed for pain - Do not combine with any other over-the-counter pain relievers/NSAID's including aspirin, Ibuprofen, Naproxen. Tylenol/Acetaminophen is okay - If you experience any abdominal pain or dark, tarry stools, stop Meloxicam and make an appointment. Long-term use of this medicine can cause gastritis and ulcers that can bleed.  - Follow-up with Dr. Karie Schwalbe in 2 weeks   Wrist Splint A splint is a medical device that keeps an injured part of your body from moving. A splint supports your wrist like a cast, but it is more flexible. It can be removed or loosened. The supporting part of a splint does not completely surround your wrist. It is held in place with an elastic band or straps. You may need a wrist splint if you have hurt your wrist or if you have a condition that causes swelling. Depending on the type of wrist problem you have, your splint may extend up your arm, onto your hand, or around your thumb. The wrist splint may be worn to:  Support your wrist.  Protect your injury.  Prevent further injury.  Prevent movement.  Reduce pain.  Promote healing. RISKS AND COMPLICATIONS The most dangerous complication of wearing a splint is having a reduced blood supply to your wrist or hand. This can happen if there is a lot of swelling or if the splint is too tight. This results in a condition called compartment syndrome and can cause permanent damage. Symptoms include:  Pain that is getting worse.  Tingling and numbness.  Changes in skin color (paleness or a bluish color).  Cold fingers. Other complications of wearing a splint can include:   Skin irritation that can cause: ? Itching. ? Rash. ? Skin sores. ? Skin infection.  Wrist stiffness. This can occur if you have worn a splint for a long time. HOW TO  USE YOUR WRIST SPLINT Your wrist splint should be tight enough to support your wrist without cutting off your blood supply. How long you need to wear the splint depends on the type of wrist problem you have. Your health care provider will instruct you on how to wear your wrist splint and for how long.  Follow all your health care provider's instructions.  Take medicine only as directed by your health care provider.  Keep your wrist elevated above the level of your heart while resting.  Ice may help reduce pain and swelling. ? Place ice in a plastic bag. ? Place a towel between your splint and the bag. ? Leave the ice on for 20 minutes, 2-3 times a day.  Do not get your splint wet.  Do not push objects under your splint to scratch your skin.  Loosen your splint if it feels too tight. Consult your health care provider if you have questions about how tight to wear the splint.  Keep all follow-up visits as directed by your health care provider. This is important. SEEK MEDICAL CARE IF:  You have wrist pain or swelling that does not go away.  The skin around or under your splint becomes red, itchy, or moist.  You have chills or fever.  Your splint feels too tight or too loose.  Your splint gets damaged. SEEK IMMEDIATE MEDICAL CARE IF: You have symptoms of compartment syndrome. These include:   Pain that is  getting worse.  Tingling and numbness.  Changes in skin color (paleness or a bluish color).  Cold fingers. MAKE SURE YOU:  Understand these instructions.  Will watch your condition.  Will get help right away if you are not doing well or get worse. This information is not intended to replace advice given to you by your health care provider. Make sure you discuss any questions you have with your health care provider. Document Released: 03/27/2006 Document Revised: 05/05/2014 Document Reviewed: 07/26/2013 Elsevier Interactive Patient Education  2017 [employer].

## 2016-09-29 NOTE — Progress Notes (Addendum)
HPI:                                                                Brandon Riley is a 56 y.o. male who presents to [facility]: Primary Care Sports Medicine today for right arm injury  Arm Injury   The incident occurred 6 to 12 hours ago. The incident occurred at home. The injury mechanism was a fall (FOOSH). The pain is present in the right forearm. The quality of the pain is described as stabbing. The pain radiates to the right arm. The pain is severe. The pain has been worsening since the incident. Pertinent negatives include no numbness or tingling. The symptoms are aggravated by movement and palpation. He has tried ice for the symptoms. The treatment provided no relief.     Past Medical History:  Diagnosis Date  . GERD (gastroesophageal reflux disease)   . Restless leg syndrome    Past Surgical History:  Procedure Laterality Date  . WISDOM TOOTH EXTRACTION  2012   Social History  Substance Use Topics  . Smoking status: Never Smoker  . Smokeless tobacco: Not on file  . Alcohol use Yes   family history is not on file.  ROS: negative except as noted in the HPI  Medications: Current Outpatient Prescriptions  Medication Sig Dispense Refill  . Cholecalciferol (VITAMIN D3) 1000 units CAPS Take 3,000 Units by mouth daily.    . citalopram (CELEXA) 10 MG tablet Take 1 tablet (10 mg total) by mouth daily. 90 tablet 1  . Mag Aspart-Potassium Aspart (RA POTASSIUM/MAGNESIUM) 250-250 MG CAPS Take 1 capsule by mouth.    . Multiple Vitamins-Minerals (MEGA MULTI MEN PO) Take 1 tablet by mouth.    . nitroGLYCERIN (NITRODUR - DOSED IN MG/24 HR) 0.2 mg/hr patch Cut and apply 1/4 patch to most painful area q24h. 30 patch 11  . omeprazole (PRILOSEC) 40 MG capsule TAKE ONE CAPSULE BY MOUTH DAILY 30 capsule 10  . rOPINIRole (REQUIP) 0.25 MG tablet Take 0.25 mg by mouth at bedtime.     . traZODone (DESYREL) 50 MG tablet Take 0.5 tablets (25 mg total) by mouth at bedtime. 30  tablet 3   No current facility-administered medications for this visit.    Allergies  Allergen Reactions  . Celebrex [Celecoxib]   . Flexeril [Cyclobenzaprine]        Objective:  BP (!) 167/113   Pulse 78   Wt 233 lb (105.7 kg)   BMI 34.91 kg/m  Gen: well-groomed, cooperative, not ill-appearing, no distress Pulm: Normal work of breathing, normal phonation CV: radial pulse 2+ symmetric Neuro: grip strength diminished, able to wiggle fingers MSK: Right Arm: patient is favoring his right arm, decreased ROM of wrist, visible swelling of the distal forearm, tenderness of the radial aspect of the right wrist/styloid process, tenderness of the lateral proximal forearm Skin: warm, dry, intact; brisk capillary refill   No results found for this or any previous visit (from the past 72 hour(s)). No results found.    Assessment and Plan: 56 y.o. male with   1. Right wrist injury initial encounter - personally reviewed DG Forearm Right, which was negative for acute fracture. Did show some arthritic changes. Patient's arm neurovascularly intact on exam - patient's rings were  removed and he was placed in a thumb spica splint  - RICE protocol - meloxicam (MOBIC) 15 MG tablet; Take 1 tablet (15 mg total) by mouth daily.  Dispense: 30 tablet; Refill: 1 - traMADol (ULTRAM) 50 MG tablet; Take 1 tablet (50 mg total) by mouth every 8 (eight) hours as needed.  Dispense: 30 tablet; Refill: 0 - patient operates heavy machinery for work. Provided with work note for 2 weeks. - follow-up with Sports Medicine in 1-2 weeks  2. Primary osteoarthritis of right wrist   Patient education and anticipatory guidance given Patient agrees with treatment plan Follow-up in 2 weeks with Sports Medicine or sooner as needed if symptoms worsen or fail to improve  I spent 25 minutes with this patient, greater than 50% was face-to-face time counseling regarding the above diagnoses  Levonne Hubertharley E. Salinda Snedeker PA-C

## 2016-10-01 NOTE — Progress Notes (Signed)
Radiology report recommends wrist x-ray w/scaphoid view to assess for possible carpal fracture Patient is in a thumb spica orthoglass splint and immobilized, so plan does not change Complete right wrist X-ray ordered Patient will be instructed to have X-ray before his follow-up appointment with Sports Medicine

## 2016-10-01 NOTE — Addendum Note (Signed)
Addended by: Gena FrayUMMINGS, [place] Dalporto E on: 10/01/2016 05:12 PM   Modules accepted: Orders

## 2016-10-02 ENCOUNTER — Other Ambulatory Visit: Payer: Self-pay | Admitting: Sports Medicine

## 2016-10-02 DIAGNOSIS — F411 Generalized anxiety disorder: Secondary | ICD-10-CM

## 2016-10-08 ENCOUNTER — Ambulatory Visit (INDEPENDENT_AMBULATORY_CARE_PROVIDER_SITE_OTHER): Payer: Managed Care, Other (non HMO) | Admitting: Family Medicine

## 2016-10-08 ENCOUNTER — Ambulatory Visit (INDEPENDENT_AMBULATORY_CARE_PROVIDER_SITE_OTHER): Payer: Managed Care, Other (non HMO)

## 2016-10-08 VITALS — BP 127/71 | HR 77 | Wt 228.0 lb

## 2016-10-08 DIAGNOSIS — S6991XA Unspecified injury of right wrist, hand and finger(s), initial encounter: Secondary | ICD-10-CM

## 2016-10-08 DIAGNOSIS — S62101A Fracture of unspecified carpal bone, right wrist, initial encounter for closed fracture: Secondary | ICD-10-CM | POA: Diagnosis not present

## 2016-10-08 DIAGNOSIS — M25531 Pain in right wrist: Secondary | ICD-10-CM

## 2016-10-08 NOTE — Progress Notes (Signed)
   Subjective:    I'm seeing this patient as a consultation for:  Gena Frayharley Cummings PA-C  CC: Right wrist injury.  HPI: Patient was seen on June 4 for injury to his right wrist. At that time x-rays were unremarkable. He was placed in a thumb spica splint. He is here today for recheck. He notes continued pain especially across the dorsal aspect of his wrist. He works as a Naval architecttruck driver and notes even with the current splint he is unable to shift the truck near lever without significant pain. He denies any radiating pain weakness or numbness fevers or chills.  Past medical history, Surgical history, Family history not pertinant except as noted below, Social history, Allergies, and medications have been entered into the medical record, reviewed, and no changes needed.   Review of Systems: No headache, visual changes, nausea, vomiting, diarrhea, constipation, dizziness, abdominal pain, skin rash, fevers, chills, night sweats, weight loss, swollen lymph nodes, body aches, joint swelling, muscle aches, chest pain, shortness of breath, mood changes, visual or auditory hallucinations.   Objective:    Vitals:   10/08/16 1020  BP: 127/71  Pulse: 77   General: Well Developed, well nourished, and in no acute distress.  Neuro/Psych: Alert and oriented x3, extra-ocular muscles intact, able to move all 4 extremities, sensation grossly intact. Skin: Warm and dry, no rashes noted.  Respiratory: Not using accessory muscles, speaking in full sentences, trachea midline.  Cardiovascular: Pulses palpable, no extremity edema. Abdomen: Does not appear distended. MSK: Right wrist tender to palpation across the dorsal wrist. Pulses capillary refill and cessation intact distally. Motion not tested  No results found for this or any previous visit (from the past 24 hour(s)). Dg Wrist Complete Right  Result Date: 10/08/2016 CLINICAL DATA:  Right wrist pain after fall last week. EXAM: RIGHT WRIST - COMPLETE 3+ VIEW  COMPARISON:  Radiographs of September 29, 2016. FINDINGS: Possible mildly displaced triquetrum fracture is noted on lateral projection. Old ulnar styloid fracture is noted. Joint spaces are intact. IMPRESSION: Possible mildly displaced triquetrum fracture. Electronically Signed   By: Lupita RaiderJames  Green Jr, M.D.   On: 10/08/2016 10:18    Impression and Recommendations:    Assessment and Plan: 56 y.o. male with Right wrist pain after injury. Based on x-ray there certainly could be a triquetrum fracture. This would fit given his injury history and continued pain. I will obtain a CT scan of the wrist to further evaluate the degree of displacement. If surgery is not indicated he'll return to clinic will place him into an appropriate cast..  Additionally we should address his work situation. He is unable to work. We'll complete FMLA and short-term disability forms. I'm hopeful that with adequate casting all have improved pain control and be more able to work.  Follow-up after CT scan.   Orders Placed This Encounter  Procedures  . CT WRIST RIGHT WO CONTRAST    Standing Status:   Future    Standing Expiration Date:   01/08/2018    Order Specific Question:   Reason for exam:    Answer:   eval possible triquetrium fracture    Order Specific Question:   [facility]?    [facility]   No orders of the defined types were placed in this encounter.   Discussed warning signs or symptoms. Please see discharge instructions. Patient expresses understanding.  CC: Monica Bectonhekkekandam, Thomas J, MD

## 2016-10-08 NOTE — Patient Instructions (Signed)
Thank you for coming in today. You should hear about the CT scan times soon.  Follow up with me or Dr T around 24 hours after the scan is scheduled.   Continue the splint.

## 2016-10-09 ENCOUNTER — Ambulatory Visit (INDEPENDENT_AMBULATORY_CARE_PROVIDER_SITE_OTHER): Payer: Managed Care, Other (non HMO)

## 2016-10-09 DIAGNOSIS — M19031 Primary osteoarthritis, right wrist: Secondary | ICD-10-CM

## 2016-10-09 DIAGNOSIS — S62101A Fracture of unspecified carpal bone, right wrist, initial encounter for closed fracture: Secondary | ICD-10-CM

## 2016-10-09 DIAGNOSIS — S62111D Displaced fracture of triquetrum [cuneiform] bone, right wrist, subsequent encounter for fracture with routine healing: Secondary | ICD-10-CM | POA: Diagnosis not present

## 2016-10-09 DIAGNOSIS — W19XXXD Unspecified fall, subsequent encounter: Secondary | ICD-10-CM | POA: Diagnosis not present

## 2016-10-10 ENCOUNTER — Ambulatory Visit (INDEPENDENT_AMBULATORY_CARE_PROVIDER_SITE_OTHER): Payer: Managed Care, Other (non HMO) | Admitting: Sports Medicine

## 2016-10-10 DIAGNOSIS — Z0000 Encounter for general adult medical examination without abnormal findings: Secondary | ICD-10-CM

## 2016-10-10 DIAGNOSIS — E7800 Pure hypercholesterolemia, unspecified: Secondary | ICD-10-CM

## 2016-10-10 DIAGNOSIS — S62111D Displaced fracture of triquetrum [cuneiform] bone, right wrist, subsequent encounter for fracture with routine healing: Secondary | ICD-10-CM | POA: Diagnosis not present

## 2016-10-10 NOTE — Assessment & Plan Note (Addendum)
Exos cast for 6 weeks.  I billed a fracture code for this encounter, all subsequent visits will be post-op checks in the global period.

## 2016-10-10 NOTE — Assessment & Plan Note (Signed)
Rechecking lipids, other labs.

## 2016-10-10 NOTE — Progress Notes (Signed)
  Subjective:    CC: Follow-up  HPI: This is a pleasant 56 year old male, he30 was seen by Rosita Keaharlie Cummings PA-C, for a right wrist injury, x-rays showed a fracture to the triquetrum, he was then seen by Dr. Denyse Amassorey who ordered a CT of the hand and wrist that again showed the triquetral fracture, comminuted, he is on my schedule today for cast placement. Pain is moderate, persistent, localized without radiation.  Past medical history:  Negative.  See flowsheet/record as well for more information.  Surgical history: Negative.  See flowsheet/record as well for more information.  Family history: Negative.  See flowsheet/record as well for more information.  Social history: Negative.  See flowsheet/record as well for more information.  Allergies, and medications have been entered into the medical record, reviewed, and no changes needed.   Review of Systems: No fevers, chills, night sweats, weight loss, chest pain, or shortness of breath.   Objective:    General: Well Developed, well nourished, and in no acute distress.  Neuro: Alert and oriented x3, extra-ocular muscles intact, sensation grossly intact.  HEENT: Normocephalic, atraumatic, pupils equal round reactive to light, neck supple, no masses, no lymphadenopathy, thyroid nonpalpable.  Skin: Warm and dry, no rashes. Cardiac: Regular rate and rhythm, no murmurs rubs or gallops, no lower extremity edema.  Respiratory: Clear to auscultation bilaterally. Not using accessory muscles, speaking in full sentences. Right Wrist: Minimally swollen with tenderness over the dorsal triquetrum ROM smooth and normal with good flexion and extension and ulnar/radial deviation that is symmetrical with opposite wrist. No snuffbox tenderness. No tenderness over Canal of Guyon. Strength 5/5 in all directions without pain. Negative tinel's and phalens signs. Negative Finkelstein sign. Negative Watson's test.  X-ray shows avulsion from the dorsal triquetrum,  confirmed on CT of the hand, minimally comminuted fracture that is for the most part extra-articular.  Exos cast placed.  Impression and Recommendations:    Closed chip fracture of triquetrum of right wrist Exos cast for 6 weeks.  I billed a fracture code for this encounter, all subsequent visits will be post-op checks in the global period.  Hyperlipidemia Rechecking lipids, other labs.

## 2016-10-13 ENCOUNTER — Encounter: Payer: Self-pay | Admitting: Sports Medicine

## 2016-10-13 LAB — CBC
HCT: 46 % (ref 38.5–50.0)
Hemoglobin: 15.9 g/dL (ref 13.2–17.1)
MCH: 31.4 pg (ref 27.0–33.0)
MCHC: 34.6 g/dL (ref 32.0–36.0)
MCV: 90.9 fL (ref 80.0–100.0)
MPV: 9.7 fL (ref 7.5–12.5)
Platelets: 203 10*3/uL (ref 140–400)
RBC: 5.06 MIL/uL (ref 4.20–5.80)
RDW: 13.7 % (ref 11.0–15.0)
WBC: 4.7 10*3/uL (ref 3.8–10.8)

## 2016-10-14 ENCOUNTER — Other Ambulatory Visit: Payer: Self-pay

## 2016-10-14 LAB — HIV ANTIBODY (ROUTINE TESTING W REFLEX): HIV 1&2 Ab, 4th Generation: NONREACTIVE

## 2016-10-14 LAB — COMPREHENSIVE METABOLIC PANEL
ALT: 31 U/L (ref 9–46)
AST: 20 U/L (ref 10–35)
Albumin: 4.5 g/dL (ref 3.6–5.1)
Alkaline Phosphatase: 68 U/L (ref 40–115)
BUN: 19 mg/dL (ref 7–25)
CO2: 23 mmol/L (ref 20–31)
Calcium: 9.3 mg/dL (ref 8.6–10.3)
Chloride: 104 mmol/L (ref 98–110)
Creat: 1.07 mg/dL (ref 0.70–1.33)
Glucose, Bld: 108 mg/dL — ABNORMAL HIGH (ref 65–99)
Potassium: 4.2 mmol/L (ref 3.5–5.3)
Sodium: 138 mmol/L (ref 135–146)
Total Bilirubin: 1 mg/dL (ref 0.2–1.2)
Total Protein: 7.1 g/dL (ref 6.1–8.1)

## 2016-10-14 LAB — LIPID PANEL W/REFLEX DIRECT LDL
Cholesterol: 192 mg/dL (ref <200)
HDL: 35 mg/dL — ABNORMAL LOW (ref >40)
LDL-Cholesterol: 124 mg/dL — ABNORMAL HIGH
Non-HDL Cholesterol (Calc): 157 mg/dL — ABNORMAL HIGH (ref <130)
Total CHOL/HDL Ratio: 5.5 Ratio — ABNORMAL HIGH (ref <5.0)
Triglycerides: 214 mg/dL — ABNORMAL HIGH (ref <150)

## 2016-10-14 LAB — HEMOGLOBIN A1C
Hgb A1c MFr Bld: 5.5 % (ref <5.7)
Mean Plasma Glucose: 111 mg/dL

## 2016-10-14 LAB — HEPATITIS C ANTIBODY: HCV Ab: NEGATIVE

## 2016-10-14 LAB — TSH: TSH: 2.96 mIU/L (ref 0.40–4.50)

## 2016-10-20 ENCOUNTER — Other Ambulatory Visit: Payer: Self-pay | Admitting: Sports Medicine

## 2016-10-20 ENCOUNTER — Encounter: Payer: Self-pay | Admitting: Sports Medicine

## 2016-11-07 ENCOUNTER — Ambulatory Visit (INDEPENDENT_AMBULATORY_CARE_PROVIDER_SITE_OTHER): Payer: Managed Care, Other (non HMO) | Admitting: Sports Medicine

## 2016-11-07 ENCOUNTER — Encounter: Payer: Self-pay | Admitting: Sports Medicine

## 2016-11-07 DIAGNOSIS — S62111D Displaced fracture of triquetrum [cuneiform] bone, right wrist, subsequent encounter for fracture with routine healing: Secondary | ICD-10-CM

## 2016-11-07 DIAGNOSIS — K219 Gastro-esophageal reflux disease without esophagitis: Secondary | ICD-10-CM

## 2016-11-07 MED ORDER — DEXLANSOPRAZOLE 60 MG PO CPDR: 60.0000 mg | DELAYED_RELEASE_CAPSULE | Freq: Every day | ORAL | 11 refills | Status: DC

## 2016-11-07 NOTE — Assessment & Plan Note (Signed)
4 weeks post fracture, doing well in Exos cast, still has minimal tenderness over the fracture site. Continue Exos cast for 2 more weeks, then return in 4 weeks.

## 2016-11-07 NOTE — Progress Notes (Signed)
  Subjective:    CC: Multiple issues  HPI: This is a pleasant 56 year old male, he comes in with a sensation of epigastric burning, and excess mucus production after eating. He is on omeprazole 40 mg daily.  He also has a closed fracture of the dorsal triquetrum, he's been in an XO's cast for 4 weeks and returns significantly improved.  Past medical history:  Negative.  See flowsheet/record as well for more information.  Surgical history: Negative.  See flowsheet/record as well for more information.  Family history: Negative.  See flowsheet/record as well for more information.  Social history: Negative.  See flowsheet/record as well for more information.  Allergies, and medications have been entered into the medical record, reviewed, and no changes needed.   Review of Systems: No fevers, chills, night sweats, weight loss, chest pain, or shortness of breath.   Objective:    General: Well Developed, well nourished, and in no acute distress.  Neuro: Alert and oriented x3, extra-ocular muscles intact, sensation grossly intact.  HEENT: Normocephalic, atraumatic, pupils equal round reactive to light, neck supple, no masses, no lymphadenopathy, thyroid nonpalpable.  Skin: Warm and dry, no rashes. Cardiac: Regular rate and rhythm, no murmurs rubs or gallops, no lower extremity edema.  Respiratory: Clear to auscultation bilaterally. Not using accessory muscles, speaking in full sentences. Right Wrist: Inspection normal with no visible erythema or swelling. ROM smooth and normal with good flexion and extension and ulnar/radial deviation that is symmetrical with opposite wrist. Minimal tenderness over the dorsal triquetrum No snuffbox tenderness. No tenderness over Canal of Guyon. Strength 5/5 in all directions without pain. Negative tinel's and phalens signs. Negative Finkelstein sign. Negative Watson's test.  Impression and Recommendations:    Closed chip fracture of triquetrum of right  wrist 4 weeks post fracture, doing well in Exos cast, still has minimal tenderness over the fracture site. Continue Exos cast for 2 more weeks, then return in 4 weeks.  GERD (gastroesophageal reflux disease) Insufficient relief with omeprazole, switching to Dexilant. Return in one month to recheck.  I spent 25 minutes with this patient, greater than 50% was face-to-face time counseling regarding the above diagnoses

## 2016-11-07 NOTE — Assessment & Plan Note (Signed)
Insufficient relief with omeprazole, switching to Dexilant. Return in one month to recheck.

## 2016-12-05 ENCOUNTER — Ambulatory Visit (INDEPENDENT_AMBULATORY_CARE_PROVIDER_SITE_OTHER): Payer: Managed Care, Other (non HMO) | Admitting: Sports Medicine

## 2016-12-05 ENCOUNTER — Encounter: Payer: Self-pay | Admitting: Sports Medicine

## 2016-12-05 DIAGNOSIS — K219 Gastro-esophageal reflux disease without esophagitis: Secondary | ICD-10-CM | POA: Diagnosis not present

## 2016-12-05 DIAGNOSIS — S62111D Displaced fracture of triquetrum [cuneiform] bone, right wrist, subsequent encounter for fracture with routine healing: Secondary | ICD-10-CM

## 2016-12-05 NOTE — Assessment & Plan Note (Signed)
Now post 6 weeks of immobilization. Overall doing well, no longer tender over the fracture, has a bit of discomfort occasionally but overall doing well. May return to work without restrictions.

## 2016-12-05 NOTE — Progress Notes (Signed)
  Subjective:    CC: Follow-up  HPI: Triquetral fracture: Doing well, only has a bit of soreness with certain movements but otherwise happy with how things are going.  Acid reflux: Mostly with throat clearing and excessive mucus production, 40 mg of omeprazole twice a day followed by Dexilant for a month did not help. Only has minimal heartburn type symptoms, mostly laryngeopharyngeal type reflux symptoms. Doesn't have any postnasal drip symptoms.  Past medical history:  Negative.  See flowsheet/record as well for more information.  Surgical history: Negative.  See flowsheet/record as well for more information.  Family history: Negative.  See flowsheet/record as well for more information.  Social history: Negative.  See flowsheet/record as well for more information.  Allergies, and medications have been entered into the medical record, reviewed, and no changes needed.   Review of Systems: No fevers, chills, night sweats, weight loss, chest pain, or shortness of breath.   Objective:    General: Well Developed, well nourished, and in no acute distress.  Neuro: Alert and oriented x3, extra-ocular muscles intact, sensation grossly intact.  HEENT: Normocephalic, atraumatic, pupils equal round reactive to light, neck supple, no masses, no lymphadenopathy, thyroid nonpalpable.  Skin: Warm and dry, no rashes. Cardiac: Regular rate and rhythm, no murmurs rubs or gallops, no lower extremity edema.  Respiratory: Clear to auscultation bilaterally. Not using accessory muscles, speaking in full sentences. Right Wrist: Inspection normal with no visible erythema or swelling. ROM smooth and normal with good flexion and extension and ulnar/radial deviation that is symmetrical with opposite wrist. Palpation is normal over metacarpals, navicular, lunate, and TFCC; tendons without tenderness/ swelling No snuffbox tenderness. No tenderness over Canal of Guyon. Strength 5/5 in all directions without  pain. Negative tinel's and phalens signs. Negative Finkelstein sign. Negative Watson's test.  Impression and Recommendations:    Closed chip fracture of triquetrum of right wrist Now post 6 weeks of immobilization. Overall doing well, no longer tender over the fracture, has a bit of discomfort occasionally but overall doing well. May return to work without restrictions.  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 Nothing in the history or exam to suggest postnasal drip syndrome. Omeprazole 40 mg twice a day was ineffective, changing his dietary habits and not eating before bedtime was also ineffective.  I switched him to Dexilant at the last visit, didn't have much improvement. I would like him to touch base with gastroenterology.  I spent 25 minutes with this patient, greater than 50% was face-to-face time counseling regarding the above diagnoses

## 2016-12-05 NOTE — Assessment & Plan Note (Signed)
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 Nothing in the history or exam to suggest postnasal drip syndrome. Omeprazole 40 mg twice a day was ineffective, changing his dietary habits and not eating before bedtime was also ineffective.  I switched him to Dexilant at the last visit, didn't have much improvement. I would like him to touch base with gastroenterology.

## 2016-12-19 LAB — HM COLONOSCOPY

## 2016-12-24 ENCOUNTER — Encounter: Payer: Self-pay | Admitting: Sports Medicine

## 2016-12-31 ENCOUNTER — Encounter: Payer: Self-pay | Admitting: Sports Medicine

## 2016-12-31 MED ORDER — AMBULATORY NON FORMULARY MEDICATION: 0 refills | Status: DC

## 2017-01-02 ENCOUNTER — Other Ambulatory Visit: Payer: Self-pay | Admitting: Sports Medicine

## 2017-01-02 MED ORDER — AMBULATORY NON FORMULARY MEDICATION: 0 refills | Status: DC

## 2017-01-05 ENCOUNTER — Other Ambulatory Visit: Payer: Self-pay

## 2017-01-05 DIAGNOSIS — G4733 Obstructive sleep apnea (adult) (pediatric): Secondary | ICD-10-CM

## 2017-01-05 MED ORDER — AMBULATORY NON FORMULARY MEDICATION
0 refills | Status: DC
Start: 2017-01-05 — End: 2018-04-02

## 2017-01-10 ENCOUNTER — Encounter: Payer: Self-pay | Admitting: Sports Medicine

## 2017-01-11 ENCOUNTER — Other Ambulatory Visit: Payer: Self-pay | Admitting: Sports Medicine

## 2017-01-21 ENCOUNTER — Encounter: Payer: Self-pay | Admitting: Sports Medicine

## 2017-01-26 ENCOUNTER — Encounter: Payer: Self-pay | Admitting: Sports Medicine

## 2017-01-26 NOTE — Progress Notes (Signed)
Letter for CPAP, Leta this is in my box, Fawzi gives the name of the person to fax it to.  I think its her phone number and not her fax though.

## 2017-01-28 NOTE — Progress Notes (Signed)
Fax sent.

## 2017-02-09 ENCOUNTER — Ambulatory Visit (INDEPENDENT_AMBULATORY_CARE_PROVIDER_SITE_OTHER): Payer: Managed Care, Other (non HMO) | Admitting: Sports Medicine

## 2017-02-09 VITALS — BP 129/68 | HR 81

## 2017-02-09 DIAGNOSIS — Z23 Encounter for immunization: Secondary | ICD-10-CM | POA: Diagnosis not present

## 2017-02-09 NOTE — Progress Notes (Signed)
Pt given flu shot in LD without complications.

## 2017-04-02 ENCOUNTER — Encounter: Payer: Self-pay | Admitting: Sports Medicine

## 2017-04-28 HISTORY — PX: RECONSTRUCTION OF NOSE: SHX2301

## 2017-05-01 ENCOUNTER — Other Ambulatory Visit: Payer: Self-pay | Admitting: Sports Medicine

## 2017-06-04 ENCOUNTER — Encounter: Payer: Self-pay | Admitting: Sports Medicine

## 2017-06-04 ENCOUNTER — Ambulatory Visit (INDEPENDENT_AMBULATORY_CARE_PROVIDER_SITE_OTHER): Payer: Worker's Compensation | Admitting: Sports Medicine

## 2017-06-04 ENCOUNTER — Inpatient Hospital Stay: Payer: Managed Care, Other (non HMO) | Admitting: Family Medicine

## 2017-06-04 DIAGNOSIS — S0292XA Unspecified fracture of facial bones, initial encounter for closed fracture: Secondary | ICD-10-CM
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
## 2017-06-04 NOTE — Progress Notes (Signed)
   Brandon Riley sustained a right maxillary, orbital, and nasal fracture at work.  He already has an ORIF scheduled with ENT.  He is not going to be able to use his CPAP for 2 weeks, and they were here wondering what is next.  Ultimately we do not really need to do anything is, he will simply not use his CPAP machine for 2 weeks and then go back to it.  I am going to no charge this visit since we really did not do much. ___________________________________________ Brandon Riley, M.D., ABFM., CAQSM. Primary Care and Sports Medicine [facility]  Adjunct Instructor of Family Medicine  [employer]

## 2017-06-05 ENCOUNTER — Encounter: Payer: Managed Care, Other (non HMO) | Admitting: Sports Medicine

## 2017-06-13 ENCOUNTER — Other Ambulatory Visit: Payer: Self-pay | Admitting: Sports Medicine

## 2017-07-17 ENCOUNTER — Encounter: Payer: Self-pay | Admitting: Sports Medicine

## 2017-07-17 ENCOUNTER — Ambulatory Visit (INDEPENDENT_AMBULATORY_CARE_PROVIDER_SITE_OTHER): Payer: 59 | Admitting: Sports Medicine

## 2017-07-17 DIAGNOSIS — G4733 Obstructive sleep apnea (adult) (pediatric): Secondary | ICD-10-CM | POA: Diagnosis not present

## 2017-07-17 NOTE — Patient Instructions (Signed)
Contact [facility]. Their fax # is, [phone] and ph # is, [phone].  See if their rep can help you fix the max leak.

## 2017-07-17 NOTE — Progress Notes (Signed)
Subjective:    CC: CPAP issue  HPI: Brandon Riley is a pleasant 57 year old male, he recently had a closed reduction of a septal fracture as well as a nasal bone fracture, he was unable to wear his CPAP mask for a period of time, more recently he has been cleared to wear his CPAP, he feels well rested in the mornings but his wife has noted some air leak.  He is wondering if we had any ideas.  He has tried tightening the straps without any improvement.  I reviewed the past medical history, family history, social history, surgical history, and allergies today and no changes were needed.  Please see the problem list section below in epic for further details.  Past Medical History: Past Medical History:  Diagnosis Date  . GERD (gastroesophageal reflux disease)   . Restless leg syndrome    Past Surgical History: Past Surgical History:  Procedure Laterality Date  . WISDOM TOOTH EXTRACTION  2012   Social History: Social History   Socioeconomic History  . Marital status: Married    Spouse name: Not on file  . Number of children: Not on file  . Years of education: Not on file  . Highest education level: Not on file  Occupational History  . Not on file  Social Needs  . Financial resource strain: Not on file  . Food insecurity:    Worry: Not on file    Inability: Not on file  . Transportation needs:    Medical: Not on file    Non-medical: Not on file  Tobacco Use  . Smoking status: Never Smoker  . Smokeless tobacco: Never Used  Substance and Sexual Activity  . Alcohol use: Yes  . Drug use: Not on file  . Sexual activity: Not on file  Lifestyle  . Physical activity:    Days per week: Not on file    Minutes per session: Not on file  . Stress: Not on file  Relationships  . Social connections:    Talks on phone: Not on file    Gets together: Not on file    Attends religious service: Not on file    Active member of club or organization: Not on file    Attends meetings of clubs or  organizations: Not on file    Relationship status: Not on file  Other Topics Concern  . Not on file  Social History Narrative  . Not on file   Family History: No family history on file. Allergies: Allergies  Allergen Reactions  . Celebrex [Celecoxib]   . Flexeril [Cyclobenzaprine]    Medications: See med rec.  Review of Systems: No fevers, chills, night sweats, weight loss, chest pain, or shortness of breath.   Objective:    General: Well Developed, well nourished, and in no acute distress.  Neuro: Alert and oriented x3, extra-ocular muscles intact, sensation grossly intact.  HEENT: Normocephalic, atraumatic, pupils equal round reactive to light, neck supple, no masses, no lymphadenopathy, thyroid nonpalpable.  Skin: Warm and dry, no rashes. Cardiac: Regular rate and rhythm, no murmurs rubs or gallops, no lower extremity edema.  Respiratory: Clear to auscultation bilaterally. Not using accessory muscles, speaking in full sentences.  Impression and Recommendations:    Obstructive sleep apnea on CPAP with periodic limb movement disorder Overall doing well, feeling well rested in the morning but concerned about leak from his CPAP mask. IN here.  He is also going to get me a download of his pressures.  I do suspect  that we do not need to make any changes right now. ___________________________________________ Ihor Austinhomas J. Benjamin Stainhekkekandam, M.D., ABFM., CAQSM. Primary Care and Sports Medicine [facility]  Adjunct Instructor of Family Medicine  [employer]

## 2017-07-17 NOTE — Assessment & Plan Note (Signed)
Overall doing well, feeling well rested in the morning but concerned about leak from his CPAP mask. IN here.  He is also going to get me a download of his pressures.  I do suspect that we do not need to make any changes right now.

## 2017-07-20 ENCOUNTER — Other Ambulatory Visit: Payer: Self-pay | Admitting: Sports Medicine

## 2017-07-20 ENCOUNTER — Encounter: Payer: Self-pay | Admitting: Sports Medicine

## 2017-07-20 DIAGNOSIS — F411 Generalized anxiety disorder: Secondary | ICD-10-CM

## 2017-09-01 ENCOUNTER — Other Ambulatory Visit: Payer: Self-pay | Admitting: Sports Medicine

## 2017-09-01 ENCOUNTER — Encounter: Payer: Self-pay | Admitting: Sports Medicine

## 2017-09-01 DIAGNOSIS — F411 Generalized anxiety disorder: Secondary | ICD-10-CM

## 2017-09-01 MED ORDER — CITALOPRAM HYDROBROMIDE 10 MG PO TABS: 10.0000 mg | ORAL_TABLET | Freq: Every day | ORAL | 0 refills | Status: DC

## 2017-09-28 ENCOUNTER — Encounter: Payer: Self-pay | Admitting: Sports Medicine

## 2017-09-28 DIAGNOSIS — F411 Generalized anxiety disorder: Secondary | ICD-10-CM

## 2017-09-29 MED ORDER — CITALOPRAM HYDROBROMIDE 10 MG PO TABS: 10.0000 mg | ORAL_TABLET | Freq: Every day | ORAL | 3 refills | Status: DC

## 2017-09-29 MED ORDER — ROPINIROLE HCL 0.25 MG PO TABS: ORAL_TABLET | ORAL | 3 refills | Status: DC

## 2017-09-29 MED ORDER — OMEPRAZOLE 40 MG PO CPDR: 40.0000 mg | DELAYED_RELEASE_CAPSULE | Freq: Every day | ORAL | 3 refills | Status: DC

## 2017-09-29 MED ORDER — TRAZODONE HCL 50 MG PO TABS: 25.0000 mg | ORAL_TABLET | Freq: Every day | ORAL | 3 refills | Status: DC

## 2018-01-28 ENCOUNTER — Encounter: Payer: Managed Care, Other (non HMO) | Admitting: Sports Medicine

## 2018-03-19 IMAGING — DX DG WRIST COMPLETE 3+V*R*
4 series · 4 of 4 positions shown · non-contrast
Comparison: Radiographs September 29, 2016.

CLINICAL DATA: Right wrist pain after fall last week.

EXAM:
RIGHT WRIST - COMPLETE 3+ VIEW

[wrist pa]
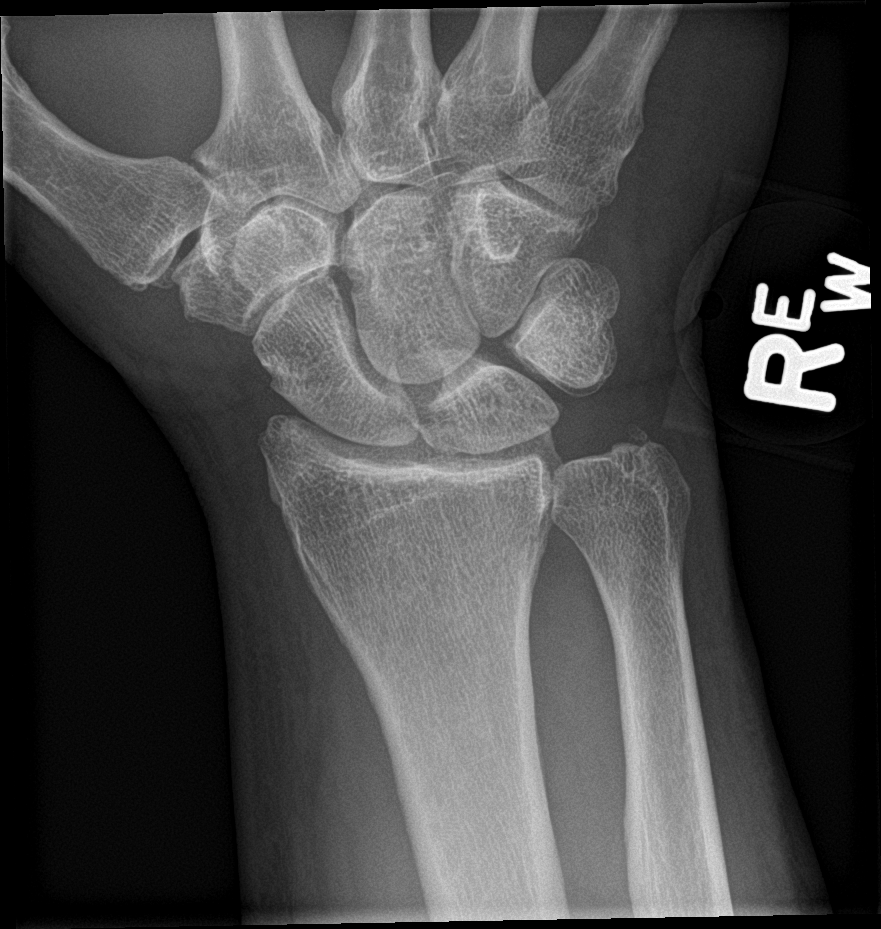

[wrist obl]
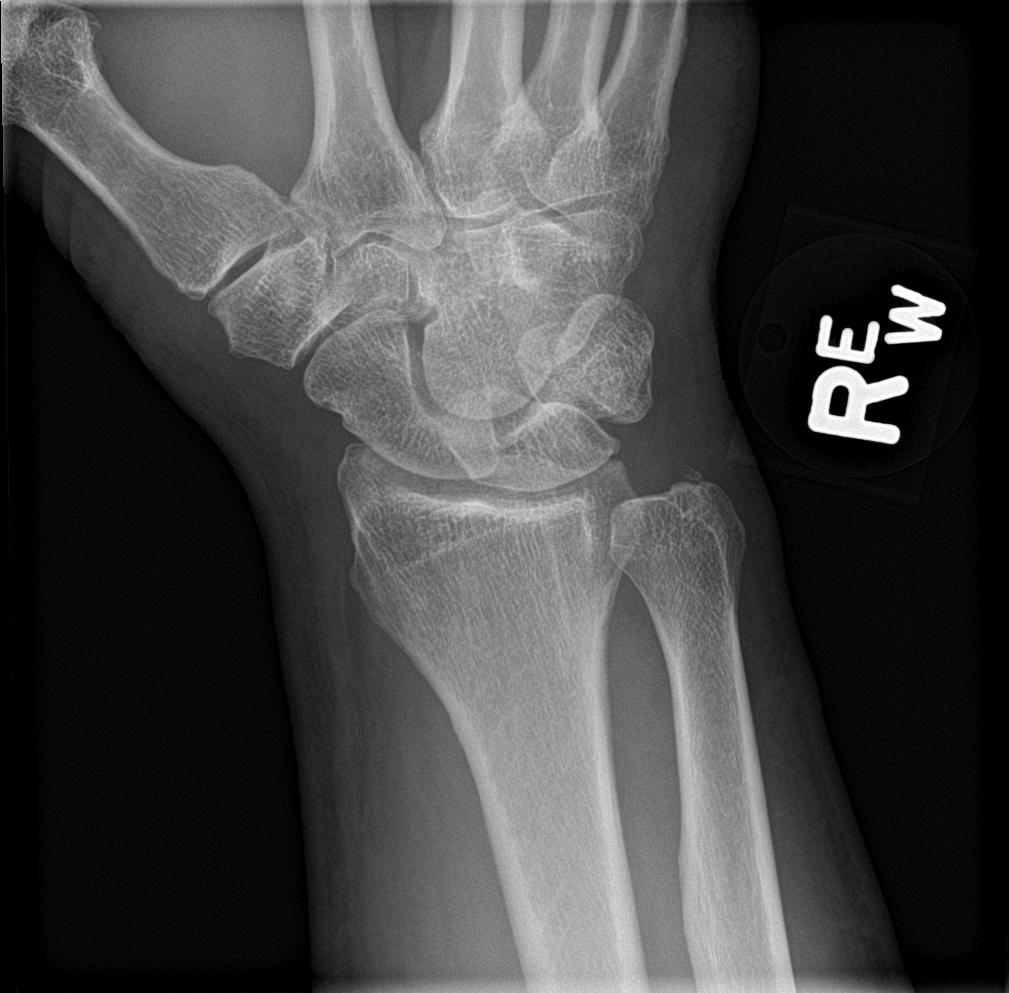

[wrist lat]
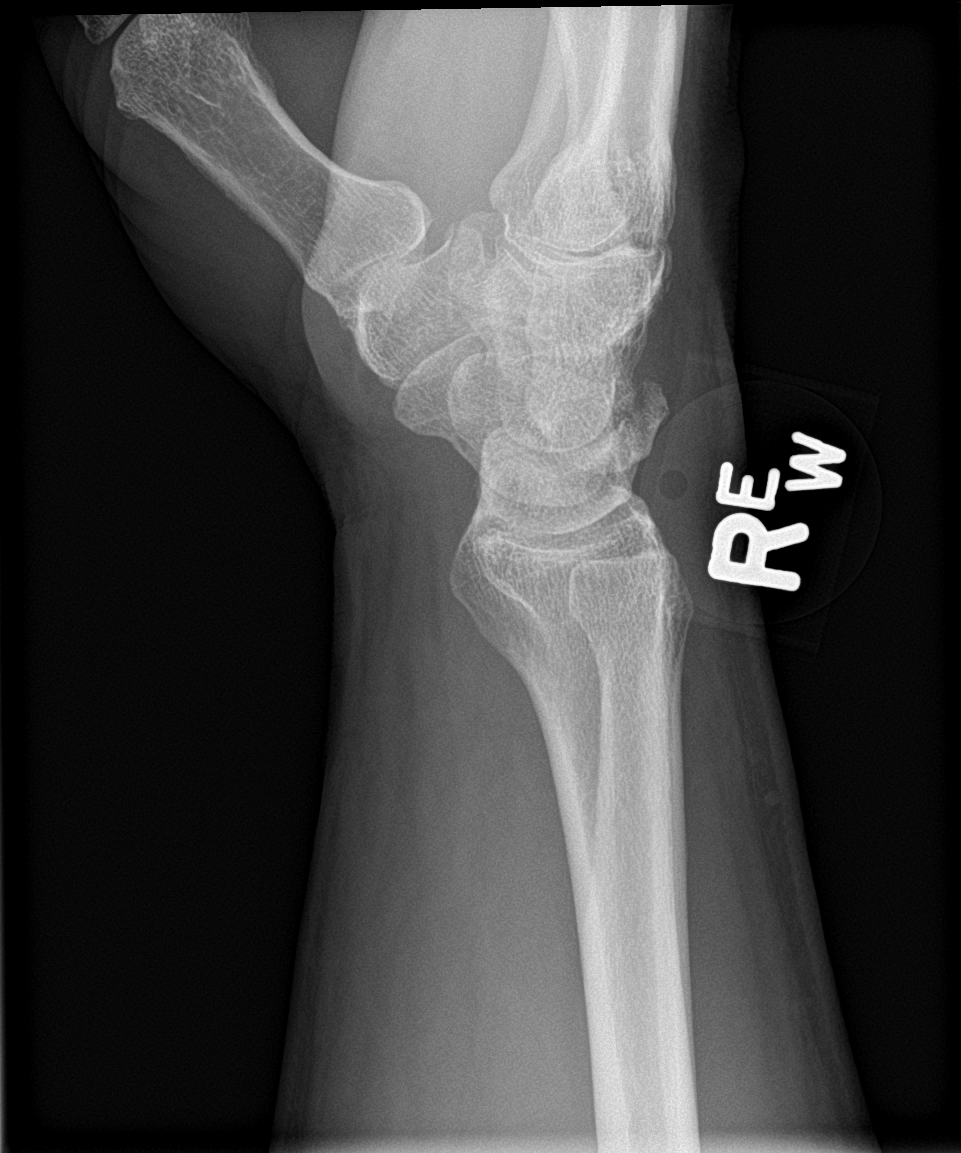

[wrist navicular]
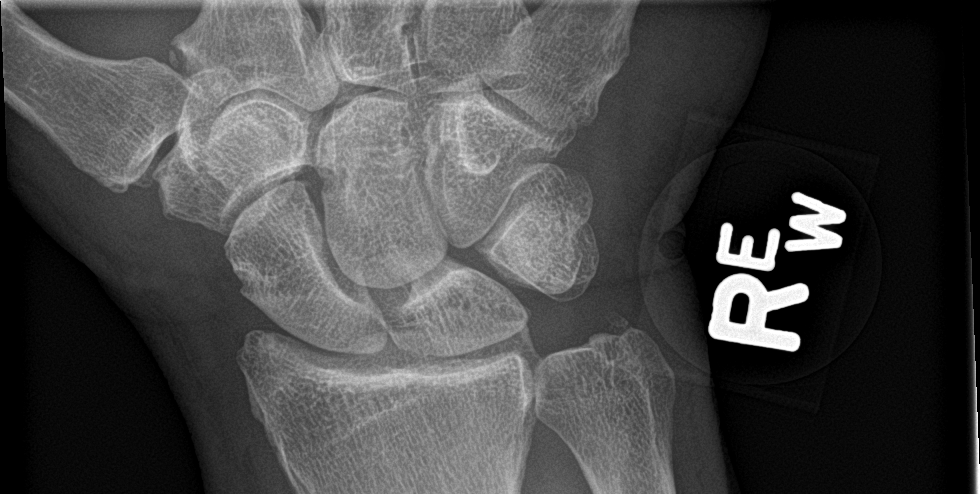

[4 of 4 positions shown; findings below may reference images not displayed]

FINDINGS: Possible mildly displaced triquetrum fracture is noted on lateral
projection. Old ulnar styloid fracture is noted. Joint spaces are
intact.
IMPRESSION: Possible mildly displaced triquetrum fracture.

## 2018-04-02 ENCOUNTER — Ambulatory Visit (INDEPENDENT_AMBULATORY_CARE_PROVIDER_SITE_OTHER): Payer: 59 | Admitting: Sports Medicine

## 2018-04-02 ENCOUNTER — Encounter: Payer: Self-pay | Admitting: Sports Medicine

## 2018-04-02 DIAGNOSIS — R222 Localized swelling, mass and lump, trunk: Secondary | ICD-10-CM | POA: Insufficient documentation

## 2018-04-02 DIAGNOSIS — Z23 Encounter for immunization: Secondary | ICD-10-CM | POA: Diagnosis not present

## 2018-04-02 DIAGNOSIS — R600 Localized edema: Secondary | ICD-10-CM

## 2018-04-02 DIAGNOSIS — Z0000 Encounter for general adult medical examination without abnormal findings: Secondary | ICD-10-CM | POA: Diagnosis not present

## 2018-04-02 DIAGNOSIS — F411 Generalized anxiety disorder: Secondary | ICD-10-CM | POA: Diagnosis not present

## 2018-04-02 DIAGNOSIS — E7800 Pure hypercholesterolemia, unspecified: Secondary | ICD-10-CM | POA: Diagnosis not present

## 2018-04-02 NOTE — Assessment & Plan Note (Signed)
Rechecking lipids. 
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
## 2018-04-02 NOTE — Assessment & Plan Note (Signed)
Adding a BNP and urinalysis.

## 2018-04-02 NOTE — Assessment & Plan Note (Signed)
Annual physical exam as above. Checking all of his routine labs including testosterone and PSA.
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
## 2018-04-02 NOTE — Assessment & Plan Note (Signed)
Patient will return for surgical excision. 

## 2018-04-06 LAB — TSH: TSH: 1.97 mIU/L (ref 0.40–4.50)

## 2018-04-06 LAB — COMPREHENSIVE METABOLIC PANEL
AG Ratio: 1.6 (calc) (ref 1.0–2.5)
ALT: 45 U/L (ref 9–46)
AST: 39 U/L — ABNORMAL HIGH (ref 10–35)
Albumin: 4.5 g/dL (ref 3.6–5.1)
Alkaline phosphatase (APISO): 69 U/L (ref 40–115)
BUN: 18 mg/dL (ref 7–25)
CO2: 25 mmol/L (ref 20–32)
Calcium: 9.8 mg/dL (ref 8.6–10.3)
Chloride: 105 mmol/L (ref 98–110)
Creat: 0.99 mg/dL (ref 0.70–1.33)
Globulin: 2.8 g/dL (calc) (ref 1.9–3.7)
Glucose, Bld: 83 mg/dL (ref 65–99)
Potassium: 3.9 mmol/L (ref 3.5–5.3)
Sodium: 139 mmol/L (ref 135–146)
Total Bilirubin: 1.5 mg/dL — ABNORMAL HIGH (ref 0.2–1.2)
Total Protein: 7.3 g/dL (ref 6.1–8.1)

## 2018-04-06 LAB — CBC
HCT: 43.8 % (ref 38.5–50.0)
Hemoglobin: 15.3 g/dL (ref 13.2–17.1)
MCH: 30.5 pg (ref 27.0–33.0)
MCHC: 34.9 g/dL (ref 32.0–36.0)
MCV: 87.4 fL (ref 80.0–100.0)
MPV: 9.9 fL (ref 7.5–12.5)
Platelets: 203 10*3/uL (ref 140–400)
RBC: 5.01 10*6/uL (ref 4.20–5.80)
RDW: 12.8 % (ref 11.0–15.0)
WBC: 5.2 10*3/uL (ref 3.8–10.8)

## 2018-04-06 LAB — PSA, TOTAL AND FREE
PSA, % Free: 26 % (calc) (ref >25)
PSA, Free: 0.7 ng/mL
PSA, Total: 2.7 ng/mL (ref <=4.0)

## 2018-04-06 LAB — URINALYSIS W MICROSCOPIC + REFLEX CULTURE
Bacteria, UA: NONE SEEN /HPF
Bilirubin Urine: NEGATIVE
Glucose, UA: NEGATIVE
Hgb urine dipstick: NEGATIVE
Hyaline Cast: NONE SEEN /LPF
Ketones, ur: NEGATIVE
Leukocyte Esterase: NEGATIVE
Nitrites, Initial: NEGATIVE
Protein, ur: NEGATIVE
RBC / HPF: NONE SEEN /HPF (ref 0–2)
Specific Gravity, Urine: 1.022 (ref 1.001–1.03)
Squamous Epithelial / HPF: NONE SEEN /HPF (ref <=5)
WBC, UA: NONE SEEN /HPF (ref 0–5)
pH: <=5 (ref 5.0–8.0)

## 2018-04-06 LAB — NO CULTURE INDICATED

## 2018-04-06 LAB — LIPID PANEL W/REFLEX DIRECT LDL
Cholesterol: 197 mg/dL (ref <200)
HDL: 40 mg/dL — ABNORMAL LOW (ref >40)
LDL Cholesterol (Calc): 130 mg/dL (calc) — ABNORMAL HIGH
Non-HDL Cholesterol (Calc): 157 mg/dL (calc) — ABNORMAL HIGH (ref <130)
Total CHOL/HDL Ratio: 4.9 (calc) (ref <5.0)
Triglycerides: 153 mg/dL — ABNORMAL HIGH (ref <150)

## 2018-04-06 LAB — TESTOSTERONE, FREE & TOTAL
Free Testosterone: 62.6 pg/mL (ref 35.0–155.0)
Testosterone, Total, LC-MS-MS: 535 ng/dL (ref 250–1100)

## 2018-04-06 LAB — BRAIN NATRIURETIC PEPTIDE: Brain Natriuretic Peptide: 24 pg/mL (ref <100)

## 2018-04-08 ENCOUNTER — Encounter: Payer: Self-pay | Admitting: Sports Medicine

## 2018-04-08 DIAGNOSIS — E7800 Pure hypercholesterolemia, unspecified: Secondary | ICD-10-CM

## 2018-04-09 MED ORDER — ROSUVASTATIN CALCIUM 10 MG PO TABS: 10.0000 mg | ORAL_TABLET | Freq: Every day | ORAL | 3 refills | Status: DC

## 2018-04-09 NOTE — Assessment & Plan Note (Signed)
Starting rosuvastatin 10, recheck in 2 months.

## 2018-04-09 NOTE — Addendum Note (Signed)
Addended by: Monica BectonHEKKEKANDAM, THOMAS J on: 04/09/2018 06:06 PM   Modules accepted: Orders

## 2018-04-23 ENCOUNTER — Ambulatory Visit (INDEPENDENT_AMBULATORY_CARE_PROVIDER_SITE_OTHER): Payer: 59 | Admitting: Sports Medicine

## 2018-04-23 ENCOUNTER — Encounter: Payer: Self-pay | Admitting: Sports Medicine

## 2018-04-23 DIAGNOSIS — L989 Disorder of the skin and subcutaneous tissue, unspecified: Secondary | ICD-10-CM | POA: Diagnosis not present

## 2018-04-23 DIAGNOSIS — R222 Localized swelling, mass and lump, trunk: Secondary | ICD-10-CM

## 2018-04-23 NOTE — Addendum Note (Signed)
Addended by: Juel BurrowBENDER, Shaiann Mcmanamon L on: 04/23/2018 04:28 PM   Modules accepted: Orders

## 2018-04-23 NOTE — Progress Notes (Signed)
   Procedure:  Excision of 1.5 cm left mid back lesion Risks, benefits, and alternatives explained and consent obtained. Time out conducted. Surface prepped with alcohol. 5cc lidocaine with epinephine infiltrated in a field block. Adequate anesthesia ensured. Area prepped and draped in a sterile fashion. Excision performed with: Lesion removed en bloc, I then closed the incision with 3 3-0 simple interrupted Ethilon sutures. Hemostasis achieved. Pt stable.

## 2018-04-23 NOTE — Assessment & Plan Note (Signed)
Surgical excision, return in 10 days for wound check/suture removal.

## 2018-04-23 NOTE — Patient Instructions (Signed)
Incision Care, Adult °An incision is a surgical cut that is made through your skin. Most incisions are closed after surgery. Your incision may be closed with stitches (sutures), staples, skin glue, or adhesive strips. You may need to return to your health care provider to have sutures or staples removed. This may occur several days to several weeks after your surgery. The incision needs to be cared for properly to prevent infection. °How to care for your incision °Incision care ° °· Follow instructions from your health care provider about how to take care of your incision. Make sure you: °? Wash your hands with soap and water before you change the bandage (dressing). If soap and water are not available, use hand sanitizer. °? Change your dressing as told by your health care provider. °? Leave sutures, skin glue, or adhesive strips in place. These skin closures may need to stay in place for 2 weeks or longer. If adhesive strip edges start to loosen and curl up, you may trim the loose edges. Do not remove adhesive strips completely unless your health care provider tells you to do that. °· Check your incision area every day for signs of infection. Check for: °? More redness, swelling, or pain. °? More fluid or blood. °? Warmth. °? Pus or a bad smell. °· Ask your health care provider how to clean the incision. This may include: °? Using mild soap and water. °? Using a clean towel to pat the incision dry after cleaning it. °? Applying a cream or ointment. Do this only as told by your health care provider. °? Covering the incision with a clean dressing. °· Ask your health care provider when you can leave the incision uncovered. °· Do not take baths, swim, or use a hot tub until your health care provider approves. Ask your health care provider if you can take showers. You may only be allowed to take sponge baths for bathing. °Medicines °· If you were prescribed an antibiotic medicine, cream, or ointment, take or apply the  antibiotic as told by your health care provider. Do not stop taking or applying the antibiotic even if your condition improves. °· Take over-the-counter and prescription medicines only as told by your health care provider. °General instructions °· Limit movement around your incision to improve healing. °? Avoid straining, lifting, or exercise for the first month, or for as long as told by your health care provider. °? Follow instructions from your health care provider about returning to your normal activities. °? Ask your health care provider what activities are safe. °· Protect your incision from the sun when you are outside for the first 6 months, or for as long as told by your health care provider. Apply sunscreen around the scar or cover it up. °· Keep all follow-up visits as told by your health care provider. This is important. °Contact a health care provider if: °· Your have more redness, swelling, or pain around the incision. °· You have more fluid or blood coming from the incision. °· Your incision feels warm to the touch. °· You have pus or a bad smell coming from the incision. °· You have a fever or shaking chills. °· You are nauseous or you vomit. °· You are dizzy. °· Your sutures or staples come undone. °Get help right away if: °· You have a red streak coming from your incision. °· Your incision bleeds through the dressing and the bleeding does not stop with gentle pressure. °· The edges of   your incision open up and separate. °· You have severe pain. °· You have a rash. °· You are confused. °· You faint. °· You have trouble breathing and a fast heartbeat. °This information is not intended to replace advice given to you by your health care provider. Make sure you discuss any questions you have with your health care provider. °Document Released: 11/01/2004 Document Revised: 12/21/2015 Document Reviewed: 10/31/2015 °Elsevier Interactive Patient Education © 2019 Elsevier Inc. ° °

## 2018-05-03 ENCOUNTER — Encounter: Payer: Self-pay | Admitting: Sports Medicine

## 2018-05-03 ENCOUNTER — Ambulatory Visit (INDEPENDENT_AMBULATORY_CARE_PROVIDER_SITE_OTHER): Payer: 59 | Admitting: Sports Medicine

## 2018-05-03 DIAGNOSIS — R222 Localized swelling, mass and lump, trunk: Secondary | ICD-10-CM

## 2018-05-03 NOTE — Assessment & Plan Note (Addendum)
Doing well post surgical excision, sutures removed, return as needed. Dermatopathology was benign.

## 2018-05-03 NOTE — Progress Notes (Signed)
  Subjective: 10 days post flank sebaceous cyst excision, doing extremely well.  Objective: General: Well-developed, well-nourished, and in no acute distress. Incision: Clean, dry, intact, #3 interrupted sutures were removed.  Assessment/plan:   Subcutaneous nodule of back Doing well post surgical excision, sutures removed, return as needed. Dermatopathology was benign. ___________________________________________ Ihor Austin. Benjamin Stain, M.D., ABFM., CAQSM. Primary Care and Sports Medicine [facility]  Adjunct Instructor of Family Medicine  [employer]

## 2018-06-21 ENCOUNTER — Encounter: Payer: Self-pay | Admitting: Sports Medicine

## 2018-06-21 DIAGNOSIS — E7800 Pure hypercholesterolemia, unspecified: Secondary | ICD-10-CM

## 2018-07-15 ENCOUNTER — Other Ambulatory Visit: Payer: Self-pay

## 2018-07-15 ENCOUNTER — Ambulatory Visit (INDEPENDENT_AMBULATORY_CARE_PROVIDER_SITE_OTHER): Payer: 59

## 2018-07-15 ENCOUNTER — Encounter: Payer: Self-pay | Admitting: Sports Medicine

## 2018-07-15 ENCOUNTER — Ambulatory Visit (INDEPENDENT_AMBULATORY_CARE_PROVIDER_SITE_OTHER): Payer: 59 | Admitting: Sports Medicine

## 2018-07-15 DIAGNOSIS — M11262 Other chondrocalcinosis, left knee: Secondary | ICD-10-CM

## 2018-07-15 DIAGNOSIS — M25562 Pain in left knee: Secondary | ICD-10-CM | POA: Diagnosis not present

## 2018-07-15 DIAGNOSIS — M94262 Chondromalacia, left knee: Secondary | ICD-10-CM | POA: Diagnosis not present

## 2018-07-15 DIAGNOSIS — M25462 Effusion, left knee: Secondary | ICD-10-CM

## 2018-07-15 DIAGNOSIS — M1712 Unilateral primary osteoarthritis, left knee: Secondary | ICD-10-CM | POA: Insufficient documentation

## 2018-07-15 NOTE — Progress Notes (Addendum)
Subjective:    CC: Left knee pain  HPI: This is a pleasant 58 year old male, over the past few weeks he has had increasing pain in his left knee, on the posterior aspect, no trauma, no change in activities.  He is also noted significant swelling.  Symptoms are severe, persistent, localized without radiation.  I reviewed the past medical history, family history, social history, surgical history, and allergies today and no changes were needed.  Please see the problem list section below in epic for further details.  Past Medical History: Past Medical History:  Diagnosis Date  . GERD (gastroesophageal reflux disease)   . Restless leg syndrome    Past Surgical History: Past Surgical History:  Procedure Laterality Date  . WISDOM TOOTH EXTRACTION  2012   Social History: Social History   Socioeconomic History  . Marital status: Married    Spouse name: Not on file  . Number of children: Not on file  . Years of education: Not on file  . Highest education level: Not on file  Occupational History  . Not on file  Social Needs  . Financial resource strain: Not on file  . Food insecurity:    Worry: Not on file    Inability: Not on file  . Transportation needs:    Medical: Not on file    Non-medical: Not on file  Tobacco Use  . Smoking status: Never Smoker  . Smokeless tobacco: Never Used  Substance and Sexual Activity  . Alcohol use: Yes  . Drug use: Not on file  . Sexual activity: Not on file  Lifestyle  . Physical activity:    Days per week: Not on file    Minutes per session: Not on file  . Stress: Not on file  Relationships  . Social connections:    Talks on phone: Not on file    Gets together: Not on file    Attends religious service: Not on file    Active member of club or organization: Not on file    Attends meetings of clubs or organizations: Not on file    Relationship status: Not on file  Other Topics Concern  . Not on file  Social History Narrative  . Not  on file   Family History: No family history on file. Allergies: Allergies  Allergen Reactions  . Celebrex [Celecoxib]   . Flexeril [Cyclobenzaprine]    Medications: See med rec.  Review of Systems: No fevers, chills, night sweats, weight loss, chest pain, or shortness of breath.   Objective:    General: Well Developed, well nourished, and in no acute distress.  Neuro: Alert and oriented x3, extra-ocular muscles intact, sensation grossly intact.  HEENT: Normocephalic, atraumatic, pupils equal round reactive to light, neck supple, no masses, no lymphadenopathy, thyroid nonpalpable.  Skin: Warm and dry, no rashes. Cardiac: Regular rate and rhythm, no murmurs rubs or gallops, no lower extremity edema.  Respiratory: Clear to auscultation bilaterally. Not using accessory muscles, speaking in full sentences. Left knee: Swollen, mild effusion, tender to palpation at the posterior lateral joint line. ROM normal in flexion and extension and lower leg rotation. Ligaments with solid consistent endpoints including ACL, PCL, LCL, MCL. Negative Mcmurray's and provocative meniscal tests. Non painful patellar compression. Patellar and quadriceps tendons unremarkable. Hamstring and quadriceps strength is normal.  Procedure: Real-time Ultrasound Guided  aspiration/injection of left knee Device: GE Logiq E  Verbal informed consent obtained.  Time-out conducted.  Noted no overlying erythema, induration, or other signs of  local infection.  Skin prepped in a sterile fashion.  Local anesthesia: Topical Ethyl chloride.  With sterile technique and under real time ultrasound guidance:  Aspirated approximately 5 cc of clear, straw-colored fluid, syringe switched and 1 cc Kenalog 40, 2 cc lidocaine, 2 cc bupivacaine injected easily. Completed without difficulty  Pain immediately resolved suggesting accurate placement of the medication.  Advised to call if fevers/chills, erythema, induration, drainage, or  persistent bleeding.  Images permanently stored and available for review in the ultrasound unit.  Impression: Technically successful ultrasound guided injection.  Impression and Recommendations:    Pseudogout of left knee Occurred relatively rapidly, arthrocentesis with crystal analysis. He does need x-rays today. Return to see me in 1 month.  Arthrocentesis is positive for calcium pyrophosphate crystals indicating that this is pseudogout rather than gout.  Treatment can consist of colchicine and indomethacin as needed.   ___________________________________________ Ihor Austin. Benjamin Stain, M.D., ABFM., CAQSM. Primary Care and Sports Medicine [place] [facility]  Adjunct Professor of Family Medicine  [employer]

## 2018-07-15 NOTE — Assessment & Plan Note (Addendum)
Occurred relatively rapidly, arthrocentesis with crystal analysis. He does need x-rays today. Return to see me in 1 month.  Arthrocentesis is positive for calcium pyrophosphate crystals indicating that this is pseudogout rather than gout.  Treatment can consist of colchicine and indomethacin as needed.

## 2018-07-16 LAB — SYNOVIAL FLUID, CRYSTAL

## 2018-08-11 ENCOUNTER — Ambulatory Visit (INDEPENDENT_AMBULATORY_CARE_PROVIDER_SITE_OTHER): Payer: 59 | Admitting: Sports Medicine

## 2018-08-11 ENCOUNTER — Encounter: Payer: Self-pay | Admitting: Sports Medicine

## 2018-08-11 DIAGNOSIS — M11262 Other chondrocalcinosis, left knee: Secondary | ICD-10-CM

## 2018-08-11 MED ORDER — COLCHICINE 0.6 MG PO TABS: ORAL_TABLET | ORAL | 2 refills | Status: DC

## 2018-08-11 NOTE — Progress Notes (Signed)
Subjective:    CC: Persistent left knee pain  HPI: Brandon Riley is a very pleasant 58 year old male, I saw him a month ago for knee swelling, effusion, we did an arthrocentesis that showed calcium pyrophosphate crystals, and an injection.  Unfortunately he had only a few days of relief.  We did not use any oral medications.  Symptoms are recurrent, moderate, localized around the knee without radiation, he does have pain with terminal extension and flexion.  No mechanical symptoms.  I reviewed the past medical history, family history, social history, surgical history, and allergies today and no changes were needed.  Please see the problem list section below in epic for further details.  Past Medical History: Past Medical History:  Diagnosis Date  . GERD (gastroesophageal reflux disease)   . Restless leg syndrome    Past Surgical History: Past Surgical History:  Procedure Laterality Date  . WISDOM TOOTH EXTRACTION  2012   Social History: Social History   Socioeconomic History  . Marital status: Married    Spouse name: Not on file  . Number of children: Not on file  . Years of education: Not on file  . Highest education level: Not on file  Occupational History  . Not on file  Social Needs  . Financial resource strain: Not on file  . Food insecurity:    Worry: Not on file    Inability: Not on file  . Transportation needs:    Medical: Not on file    Non-medical: Not on file  Tobacco Use  . Smoking status: Never Smoker  . Smokeless tobacco: Never Used  Substance and Sexual Activity  . Alcohol use: Yes  . Drug use: Not on file  . Sexual activity: Not on file  Lifestyle  . Physical activity:    Days per week: Not on file    Minutes per session: Not on file  . Stress: Not on file  Relationships  . Social connections:    Talks on phone: Not on file    Gets together: Not on file    Attends religious service: Not on file    Active member of club or organization: Not on file   Attends meetings of clubs or organizations: Not on file    Relationship status: Not on file  Other Topics Concern  . Not on file  Social History Narrative  . Not on file   Family History: No family history on file. Allergies: Allergies  Allergen Reactions  . Celebrex [Celecoxib]   . Flexeril [Cyclobenzaprine]    Medications: See med rec.  Review of Systems: No fevers, chills, night sweats, weight loss, chest pain, or shortness of breath.   Objective:    General: Well Developed, well nourished, and in no acute distress.  Neuro: Alert and oriented x3, extra-ocular muscles intact, sensation grossly intact.  HEENT: Normocephalic, atraumatic, pupils equal round reactive to light, neck supple, no masses, no lymphadenopathy, thyroid nonpalpable.  Skin: Warm and dry, no rashes. Cardiac: Regular rate and rhythm, no murmurs rubs or gallops, no lower extremity edema.  Respiratory: Clear to auscultation bilaterally. Not using accessory muscles, speaking in full sentences. Left knee: Minimally swollen, mild palpable fluid wave, tenderness at the joint lines and the suprapatellar recess ROM normal in flexion and extension and lower leg rotation. Ligaments with solid consistent endpoints including ACL, PCL, LCL, MCL. Negative Mcmurray's and provocative meniscal tests. Non painful patellar compression. Patellar and quadriceps tendons unremarkable. Hamstring and quadriceps strength is normal.  Impression and Recommendations:  Pseudogout of left knee Partial response to aspiration and injection. Adding colchicine twice a day for the first week and then once daily as needed afterwards.   ___________________________________________ Ihor Austinhomas J. Benjamin Stainhekkekandam, M.D., ABFM., CAQSM. Primary Care and Sports Medicine [facility]  Adjunct Professor of Family Medicine  [employer]

## 2018-08-11 NOTE — Assessment & Plan Note (Signed)
Partial response to aspiration and injection. Adding colchicine twice a day for the first week and then once daily as needed afterwards.

## 2018-08-11 NOTE — Patient Instructions (Signed)
Calcium Pyrophosphate Deposition  Calcium pyrophosphate deposition (CPPD) is a type of arthritis that causes pain, swelling, and inflammation in a joint. Attacks of CPPD may come and go. The joint pain can be severe and may last for days to weeks. This condition usually affects one joint at a time. The knees are most often affected, but this condition can also affect the wrists, elbows, shoulders, or ankles.  CPPD may also be called pseudogout because it is similar to gout. Both conditions result from the buildup of crystals in a joint. However, CPPD is caused by a type of crystal that is different from the crystals that cause gout.  What are the causes?  This condition is caused by the buildup of calcium pyrophosphate dihydrate crystals in a joint. The reason why this buildup occurs is not known. An increased likelihood of having this condition (predisposition) may be passed from parent to child (is hereditary).  What increases the risk?  This condition is more likely to develop in people who:  · Are older than 58 years of age.  · Have a family history of CPPD.  · Have certain medical conditions, such as hemophilia, amyloidosis, or overactive parathyroid glands.  · Have low levels of magnesium in the blood.  What are the signs or symptoms?  Symptoms of this condition include:  · Joint pain. The pain may:  ? Be intense and constant.  ? Develop quickly.  ? Get worse with movement.  ? Last from several days to a few weeks.  · Redness, swelling, stiffness, and warmth at the joint.  How is this diagnosed?  To diagnose this condition, your health care provider will use a needle to remove fluid from the joint. The fluid will be examined for the crystals that cause CPPD. You also may have additional tests, such as:  · Blood tests.  · X-rays.  · Ultrasound.  · MRI.  How is this treated?  There is no way to remove the crystals from the joint and no cure for this condition. However, treatment can relieve symptoms and improve  joint function. Treatment may include:  · NSAIDs to reduce inflammation and pain.  · Removing some of the fluid and crystals from around the joint with a needle.  · Injections of medicine (cortisone) into the joint to reduce pain and swelling.  · Medicines to help prevent attacks.  · Physical therapy to improve joint function.  Follow these instructions at home:  Managing pain, stiffness, and swelling  · Rest the affected joint until your symptoms start to go away.  · If directed, put ice on the affected area to relieve pain and swelling:  ? Put ice in a plastic bag.  ? Place a towel between your skin and the bag.  ? Leave the ice on for 20 minutes, 2-3 times a day.  · Keep your affected joint raised (elevated) above the level of your heart, when possible. This will help to reduce swelling. For example, prop your foot up on a chair while sitting down to elevate your knee.  General instructions  · If the painful joint is in your leg, use crutches as told by your health care provider.  · Take over-the-counter and prescription medicines only as told by your health care provider.  · When your symptoms start to go away, begin to exercise regularly or do physical therapy. Talk with your health care provider or physical therapist about what types of exercise are safe for you. Exercise that   is easier on your joints (low-impact exercise) may be best. This includes walking, swimming, bicycling, and water aerobics.  · Maintain a healthy weight. Excess weight puts stress on your joints.  · Keep all follow-up visits as told by your health care provider and physical therapist. This is important.  Contact a health care provider if you:  · Notice that your symptoms get worse.  · Develop a skin rash.  · Notice that your pain gets worse.  Get help right away if you:  · Have a fever.  · Have difficulty breathing.  · Are taking NSAIDs and you:  ? Vomit blood.  ? Have blood in your stool.  ? Have stool that is tarry and  black.  Summary  · Calcium pyrophosphate deposition (CPPD) is a type of arthritis that causes pain, swelling, and inflammation in a joint. The knees are most often affected, but it can also affect the wrists, elbows, shoulders, or ankles.  · CPPD is caused by the buildup of calcium crystals in a joint. The reason why this occurs is not known.  · Attacks of CPPD may come and go. The joint pain can be severe and may last for days to weeks.  · There is no way to remove the crystals from the joint and no cure for this condition. However, treatment can relieve symptoms and improve joint function.  · Rest the affected joint until your symptoms start to go away. After your symptoms go away, begin to exercise regularly or do physical therapy.  This information is not intended to replace advice given to you by your health care provider. Make sure you discuss any questions you have with your health care provider.  Document Released: 01/05/2004 Document Revised: 02/10/2017 Document Reviewed: 02/10/2017  Elsevier Interactive Patient Education © 2019 Elsevier Inc.

## 2018-08-12 ENCOUNTER — Telehealth: Payer: Self-pay | Admitting: Sports Medicine

## 2018-08-12 ENCOUNTER — Encounter: Payer: Self-pay | Admitting: Sports Medicine

## 2018-08-12 ENCOUNTER — Ambulatory Visit: Payer: Self-pay | Admitting: Sports Medicine

## 2018-08-12 DIAGNOSIS — M11262 Other chondrocalcinosis, left knee: Secondary | ICD-10-CM

## 2018-08-12 NOTE — Telephone Encounter (Signed)
I Approved today (Colchecine) Request Reference Number: [id number]. COLCHICINE TAB 0.6MG is approved through 08/12/2019. For further questions, call [phone].  I got this approved but this is super expensive still even with a PA. It was around $300. Patient has been notified that his medication is still expensive. I tried to get the patient to check with insurance and see what is covered and he did not even hear me out. Per Don at the pharmacy the patient still has a high deductible that has not been met. Do you have any other recommendations. Please advise.  

## 2018-08-12 NOTE — Telephone Encounter (Signed)
Noted. See other approval note.

## 2018-08-12 NOTE — Telephone Encounter (Signed)
Information has been sent to insurance and waiting on a response.   

## 2018-08-12 NOTE — Telephone Encounter (Signed)
[employer] Colchicine 0.6MG  Tablets Key: [id number]  [employer]S

## 2018-08-16 MED ORDER — COLCRYS 0.6 MG PO TABS: ORAL_TABLET | ORAL | 3 refills | Status: DC

## 2018-09-09 ENCOUNTER — Encounter: Payer: Self-pay | Admitting: Family Medicine

## 2018-09-09 ENCOUNTER — Ambulatory Visit (INDEPENDENT_AMBULATORY_CARE_PROVIDER_SITE_OTHER): Payer: 59 | Admitting: Family Medicine

## 2018-09-09 VITALS — Temp 99.1°F | Wt 224.0 lb

## 2018-09-09 DIAGNOSIS — A084 Viral intestinal infection, unspecified: Secondary | ICD-10-CM

## 2018-09-09 MED ORDER — ONDANSETRON 8 MG PO TBDP: 8.0000 mg | ORAL_TABLET | Freq: Three times a day (TID) | ORAL | 3 refills | Status: DC | PRN

## 2018-09-09 NOTE — Progress Notes (Signed)
Virtual Visit  via Video Note  I connected with      Zorita Pang by a video enabled telemedicine application and verified that I am speaking with the correct person using two identifiers.   I discussed the limitations of evaluation and management by telemedicine and the availability of in person appointments. The patient expressed understanding and agreed to proceed.  History of Present Illness: Brandon Riley is a 58 y.o. male who would like to discuss nausea   Tuesday May 12 had fatigue and took off work. Felt better Wednesday. Today got up early for work. At work felt nauseated and chills. Went home. No vomiting or diarrhea. Ate [place] food last night and had foul smelling stool this morning. Nobody else sick with similar symptoms. Have some sinus sinus pressure. Have not tried any medications yet. Mild abdominal pain today but now resolved.  No cough congestion shortness of breath or body aches.  No change in smell or taste.  No exposure to COVID-19.  Observations/Objective: Temp 99.1 F (37.3 C) (Oral)   Wt 224 lb (101.6 kg)   BMI 33.56 kg/m  Wt Readings from Last 5 Encounters:  09/09/18 224 lb (101.6 kg)  08/11/18 224 lb (101.6 kg)  04/02/18 233 lb (105.7 kg)  07/17/17 238 lb (108 kg)  06/04/17 228 lb (103.4 kg)   Exam: Appearance nontoxic no acute distress Normal Speech.     Lab and Radiology Results No results found for this or any previous visit (from the past 72 hour(s)). No results found.   Assessment and Plan: 58 y.o. male with nausea and subjective fever and chills.  Likely viral gastroenteritis.  COVID-19 unlikely.  Plan for watchful waiting with symptomatic management.  Use Zofran for nausea or vomiting.  Okay to use Imodium or Pepto-Bismol if patient develops diarrhea.  If symptoms worsen or he develops more classic symptoms for COVID-19 reasonable to proceed with outpatient testing.  Work note provided as per typical for usual viral gastroenteritis.    PDMP  not reviewed this encounter. No orders of the defined types were placed in this encounter.  Meds ordered this encounter  Medications  . ondansetron (ZOFRAN-ODT) 8 MG disintegrating tablet    Sig: Take 1 tablet (8 mg total) by mouth every 8 (eight) hours as needed for nausea.    Dispense:  20 tablet    Refill:  3    Follow Up Instructions:    I discussed the assessment and treatment plan with the patient. The patient was provided an opportunity to ask questions and all were answered. The patient agreed with the plan and demonstrated an understanding of the instructions.   The patient was advised to call back or seek an in-person evaluation if the symptoms worsen or if the condition fails to improve as anticipated.  Time: 15 minutes of intraservice time, with >22 minutes of total time during today's visit.      Historical information moved to improve visibility of documentation.  Past Medical History:  Diagnosis Date  . GERD (gastroesophageal reflux disease)   . Restless leg syndrome    Past Surgical History:  Procedure Laterality Date  . WISDOM TOOTH EXTRACTION  2012   Social History   Tobacco Use  . Smoking status: Never Smoker  . Smokeless tobacco: Never Used  Substance Use Topics  . Alcohol use: Yes   family history is not on file.  Medications: Current Outpatient Medications  Medication Sig Dispense Refill  . Cholecalciferol (VITAMIN D3) 1000  units CAPS Take 3,000 Units by mouth daily.    . citalopram (CELEXA) 10 MG tablet Take 1 tablet (10 mg total) by mouth daily. 90 tablet 3  . COLCRYS 0.6 MG tablet 1 tab p.o. twice daily for the first week then daily as needed 30 tablet 3  . Mag Aspart-Potassium Aspart (RA POTASSIUM/MAGNESIUM) 250-250 MG CAPS Take 1 capsule by mouth.    Marland Kitchen. omeprazole (PRILOSEC) 40 MG capsule Take 1 capsule (40 mg total) by mouth daily. 90 capsule 3  . rOPINIRole (REQUIP) 0.25 MG tablet TAKE ONE TABLET BY MOUTH EVERY NIGHT AT BEDTIME INCREASE  BY .25MG  EVERY 2-3 DAYS UNTIL RELIEF IS OBTAINED 90 tablet 3  . rosuvastatin (CRESTOR) 10 MG tablet Take 1 tablet (10 mg total) by mouth daily. 90 tablet 3  . traZODone (DESYREL) 50 MG tablet Take 0.5 tablets (25 mg total) by mouth at bedtime. 45 tablet 3  . ondansetron (ZOFRAN-ODT) 8 MG disintegrating tablet Take 1 tablet (8 mg total) by mouth every 8 (eight) hours as needed for nausea. 20 tablet 3   No current facility-administered medications for this visit.    Allergies  Allergen Reactions  . Celebrex [Celecoxib]   . Flexeril [Cyclobenzaprine]

## 2018-09-09 NOTE — Patient Instructions (Signed)
Thank you for coming in today. Use Zofran as needed for vomiting or severe nausea. Use over-the-counter Imodium as needed for diarrhea. Also okay to use Pepto-Bismol over-the-counter.  If worsening please do not come to your appointment with Dr. Karie Schwalbe and we can proceed with outpatient coronavirus testing however I think COVID-19 is unlikely.  Recheck as needed.   Viral Gastroenteritis, Adult  Viral gastroenteritis is also known as the stomach flu. This condition is caused by various viruses. These viruses can be passed from person to person very easily (are very contagious). This condition may affect your stomach, small intestine, and large intestine. It can cause sudden watery diarrhea, fever, and vomiting. Diarrhea and vomiting can make you feel weak and cause you to become dehydrated. You may not be able to keep fluids down. Dehydration can make you tired and thirsty, cause you to have a dry mouth, and decrease how often you urinate. Older adults and people with other diseases or a weak immune system are at higher risk for dehydration. It is important to replace the fluids that you lose from diarrhea and vomiting. If you become severely dehydrated, you may need to get fluids through an IV tube. What are the causes? Gastroenteritis is caused by various viruses, including rotavirus and norovirus. Norovirus is the most common cause in adults. You can get sick by eating food, drinking water, or touching a surface contaminated with one of these viruses. You can also get sick from sharing utensils or other personal items with an infected person. What increases the risk? This condition is more likely to develop in people:  Who have a weak defense system (immune system).  Who live with one or more children who are younger than 65 years old.  Who live in a nursing home.  Who go on cruise ships. What are the signs or symptoms? Symptoms of this condition start suddenly 1-2 days after exposure to a  virus. Symptoms may last a few days or as long as a week. The most common symptoms are watery diarrhea and vomiting. Other symptoms include:  Fever.  Headache.  Fatigue.  Pain in the abdomen.  Chills.  Weakness.  Nausea.  Muscle aches.  Loss of appetite. How is this diagnosed? This condition is diagnosed with a medical history and physical exam. You may also have a stool test to check for viruses or other infections. How is this treated? This condition typically goes away on its own. The focus of treatment is to restore lost fluids (rehydration). Your health care provider may recommend that you take an oral rehydration solution (ORS) to replace important salts and minerals (electrolytes) in your body. Severe cases of this condition may require giving fluids through an IV tube. Treatment may also include medicine to help with your symptoms. Follow these instructions at home:  Follow instructions from your health care provider about how to care for yourself at home. Follow these recommendations as told by your health care provider:  Take an ORS. This is a drink that is sold at pharmacies and retail stores.  Drink clear fluids in small amounts as you are able. Clear fluids include water, ice chips, diluted fruit juice, and low-calorie sports drinks.  Eat bland, easy-to-digest foods in small amounts as you are able. These foods include bananas, applesauce, rice, lean meats, toast, and crackers.  Avoid fluids that contain a lot of sugar or caffeine, such as energy drinks, sports drinks, and soda.  Avoid alcohol.  Avoid spicy or fatty foods.  General instructions  Drink enough fluid to keep your urine clear or pale yellow.  Wash your hands often. If soap and water are not available, use hand sanitizer.  Make sure that all people in your household wash their hands well and often.  Take over-the-counter and prescription medicines only as told by your health care provider.   Rest at home while you recover.  Watch your condition for any changes.  Take a warm bath to relieve any burning or pain from frequent diarrhea episodes.  Keep all follow-up visits as told by your health care provider. This is important. Contact a health care provider if:  You cannot keep fluids down.  Your symptoms get worse.  You have new symptoms.  You feel light-headed or dizzy.  You have muscle cramps. Get help right away if:  You have chest pain.  You feel extremely weak or you faint.  You see blood in your vomit.  Your vomit looks like coffee grounds.  You have bloody or black stools or stools that look like tar.  You have a severe headache, a stiff neck, or both.  You have a rash.  You have severe pain, cramping, or bloating in your abdomen.  You have trouble breathing or you are breathing very quickly.  Your heart is beating very quickly.  Your skin feels cold and clammy.  You feel confused.  You have pain when you urinate.  You have signs of dehydration, such as: ? Dark urine, very little urine, or no urine. ? Cracked lips. ? Dry mouth. ? Sunken eyes. ? Sleepiness. ? Weakness. This information is not intended to replace advice given to you by your health care provider. Make sure you discuss any questions you have with your health care provider. Document Released: 04/14/2005 Document Revised: 11/27/2016 Document Reviewed: 12/19/2014 Elsevier Interactive Patient Education  2019 [employer]

## 2018-09-10 ENCOUNTER — Encounter: Payer: Self-pay | Admitting: Sports Medicine

## 2018-09-10 ENCOUNTER — Ambulatory Visit (INDEPENDENT_AMBULATORY_CARE_PROVIDER_SITE_OTHER): Payer: 59 | Admitting: Sports Medicine

## 2018-09-10 DIAGNOSIS — M1712 Unilateral primary osteoarthritis, left knee: Secondary | ICD-10-CM | POA: Diagnosis not present

## 2018-09-10 DIAGNOSIS — M25562 Pain in left knee: Secondary | ICD-10-CM | POA: Diagnosis not present

## 2018-09-10 NOTE — Progress Notes (Addendum)
Subjective:    CC: Recheck knee  HPI: Brandon RuizJohn is a pleasant 58 year old male, we treated him for pseudogout and osteoarthritis of his knee, we did an aspiration and injection, followed by colchicine, overall he still has pain, mild, localized at the posterior medial joint line without radiation, no mechanical symptoms.  He did have a GI viral infection, the symptoms have resolved.  The pain in his knee is moderate, persistent.  I reviewed the past medical history, family history, social history, surgical history, and allergies today and no changes were needed.  Please see the problem list section below in epic for further details.  Past Medical History: Past Medical History:  Diagnosis Date  . GERD (gastroesophageal reflux disease)   . Restless leg syndrome    Past Surgical History: Past Surgical History:  Procedure Laterality Date  . WISDOM TOOTH EXTRACTION  2012   Social History: Social History   Socioeconomic History  . Marital status: Married    Spouse name: Not on file  . Number of children: Not on file  . Years of education: Not on file  . Highest education level: Not on file  Occupational History  . Not on file  Social Needs  . Financial resource strain: Not on file  . Food insecurity:    Worry: Not on file    Inability: Not on file  . Transportation needs:    Medical: Not on file    Non-medical: Not on file  Tobacco Use  . Smoking status: Never Smoker  . Smokeless tobacco: Never Used  Substance and Sexual Activity  . Alcohol use: Yes  . Drug use: Not on file  . Sexual activity: Not on file  Lifestyle  . Physical activity:    Days per week: Not on file    Minutes per session: Not on file  . Stress: Not on file  Relationships  . Social connections:    Talks on phone: Not on file    Gets together: Not on file    Attends religious service: Not on file    Active member of club or organization: Not on file    Attends meetings of clubs or organizations: Not on  file    Relationship status: Not on file  Other Topics Concern  . Not on file  Social History Narrative  . Not on file   Family History: No family history on file. Allergies: Allergies  Allergen Reactions  . Celebrex [Celecoxib]   . Flexeril [Cyclobenzaprine]    Medications: See med rec.  Review of Systems: No fevers, chills, night sweats, weight loss, chest pain, or shortness of breath.   Objective:    General: Well Developed, well nourished, and in no acute distress.  Neuro: Alert and oriented x3, extra-ocular muscles intact, sensation grossly intact.  HEENT: Normocephalic, atraumatic, pupils equal round reactive to light, neck supple, no masses, no lymphadenopathy, thyroid nonpalpable.  Skin: Warm and dry, no rashes. Cardiac: Regular rate and rhythm, no murmurs rubs or gallops, no lower extremity edema.  Respiratory: Clear to auscultation bilaterally. Not using accessory muscles, speaking in full sentences. Left knee: Normal to inspection with no erythema or effusion or obvious bony abnormalities. Palpation normal with no warmth or joint line tenderness or patellar tenderness or condyle tenderness. ROM normal in flexion and extension and lower leg rotation. Ligaments with solid consistent endpoints including ACL, PCL, LCL, MCL. Negative Mcmurray's and provocative meniscal tests. Non painful patellar compression. Patellar and quadriceps tendons unremarkable. Hamstring and quadriceps strength is  normal.  Impression and Recommendations:    Primary osteoarthritis with pseudogout of left knee Partial improvement with colchicine. He did have osteoarthritis on x-rays. At this point we are going to proceed with an MRI to evaluate degree of meniscal injury, if very little meniscal injury we will proceed with Visco supplementation, I am also going to get him approved for this. If he has significant injury to the meniscus we will need arthroscopic repair before proceeding with  Visco supplementation. He has failed greater than 6 weeks of physician directed conservative measures including interventional treatment.  Large tear in the posterior horn of the medial meniscus, moderate osteoarthritis, this is going to need an operation.  I would prefer that we refer him for arthroscopy before considering Orthovisc or other viscosupplement like synvisc.   ___________________________________________ Ihor Austin. Benjamin Stain, M.D., ABFM., CAQSM. Primary Care and Sports Medicine [facility]  Adjunct Professor of Family Medicine  [employer]

## 2018-09-10 NOTE — Assessment & Plan Note (Addendum)
Partial improvement with colchicine. He did have osteoarthritis on x-rays. At this point we are going to proceed with an MRI to evaluate degree of meniscal injury, if very little meniscal injury we will proceed with Visco supplementation, I am also going to get him approved for this. If he has significant injury to the meniscus we will need arthroscopic repair before proceeding with Visco supplementation. He has failed greater than 6 weeks of physician directed conservative measures including interventional treatment.  Large tear in the posterior horn of the medial meniscus, moderate osteoarthritis, this is going to need an operation.  I would prefer that we refer him for arthroscopy before considering Orthovisc or other viscosupplement like synvisc.

## 2018-09-14 ENCOUNTER — Telehealth: Payer: Self-pay | Admitting: Sports Medicine

## 2018-09-14 NOTE — Telephone Encounter (Signed)
-----   Message from Neldon Labella, CMA sent at 09/14/2018  9:55 AM EDT ----- Information has been submitted to Orthovisc and awaiting determination.   ----- Message ----- From: Monica Becton, MD Sent: 09/10/2018   4:28 PM EDT To: Neldon Labella, CMA  Orthovisc approval please, left knee, x-ray confirmed, failed everything ___________________________________________Thomas J. Benjamin Stain, M.D., ABFM., CAQSM.[facility] MedicineC[facility] KernersvilleAdjunct Professor of Family Medicine [employer] [facility] [employer]

## 2018-09-21 ENCOUNTER — Encounter: Payer: Self-pay | Admitting: Sports Medicine

## 2018-09-22 NOTE — Telephone Encounter (Signed)
Received a fax from Orthovisc that it was not the prefered on the patients formulary. I have sent the additional information for Synvisc to insurance. MyChart message sent to patient as well.

## 2018-09-24 NOTE — Telephone Encounter (Signed)
Called the insurance and per Erskine Squibb this is a covered benefit and it would go to a speciality area with insurance and the call was disconnected. I called back and was transferred to another department. Then I had to sent to the office delivery area. This is covered under the medical benefit and will need a PA to proceed.

## 2018-09-24 NOTE — Telephone Encounter (Signed)
Synvisc denied under pharmacy benefit. May be covered under medical. Call [phone]

## 2018-09-26 ENCOUNTER — Other Ambulatory Visit: Payer: Self-pay

## 2018-09-26 ENCOUNTER — Ambulatory Visit (INDEPENDENT_AMBULATORY_CARE_PROVIDER_SITE_OTHER): Payer: 59

## 2018-09-26 DIAGNOSIS — M1712 Unilateral primary osteoarthritis, left knee: Secondary | ICD-10-CM | POA: Diagnosis not present

## 2018-09-26 DIAGNOSIS — M25562 Pain in left knee: Secondary | ICD-10-CM | POA: Diagnosis not present

## 2018-09-27 NOTE — Addendum Note (Signed)
Addended by: Monica Becton on: 09/27/2018 03:23 PM   Modules accepted: Orders

## 2018-09-29 NOTE — Telephone Encounter (Signed)
Called insurance to verify preferred and Euflexxa is the preferred and then the call was disconnected. Will try back at a later time.

## 2018-09-30 NOTE — Telephone Encounter (Signed)
Information has been sent to medical insurance for the PA for Euflexxa. Waiting on a response.

## 2018-10-06 NOTE — Telephone Encounter (Signed)
Called the patient insurance and they are experiencing technical difficulty with verifying information in their system by Maudie Mercury. I was advised to call back at a later time.

## 2018-10-11 NOTE — Telephone Encounter (Signed)
Called insurance and was disconnected.   I then called the patient and he advised me that he was going to have surgery and would not need the injections. I advised him that I would close the encounter.

## 2018-10-24 ENCOUNTER — Other Ambulatory Visit: Payer: Self-pay | Admitting: Sports Medicine

## 2018-10-25 ENCOUNTER — Other Ambulatory Visit: Payer: Self-pay

## 2018-10-25 ENCOUNTER — Other Ambulatory Visit (HOSPITAL_COMMUNITY)
Admission: RE | Admit: 2018-10-25 | Discharge: 2018-10-25 | Disposition: A | Discharge status: Home / Self Care | Payer: 59 | Source: Ambulatory Visit | Attending: Orthopaedic Surgery | Admitting: Orthopaedic Surgery

## 2018-10-25 ENCOUNTER — Encounter (HOSPITAL_BASED_OUTPATIENT_CLINIC_OR_DEPARTMENT_OTHER): Payer: Self-pay

## 2018-10-25 DIAGNOSIS — Z1159 Encounter for screening for other viral diseases: Secondary | ICD-10-CM | POA: Diagnosis not present

## 2018-10-25 DIAGNOSIS — Z01812 Encounter for preprocedural laboratory examination: Secondary | ICD-10-CM | POA: Diagnosis not present

## 2018-10-25 LAB — SARS CORONAVIRUS 2 (TAT 6-24 HRS): SARS Coronavirus 2: NEGATIVE

## 2018-10-25 NOTE — Progress Notes (Signed)
Ensure pre surgical drink given to pt with instructions to drink on the morning of surgery by 0400. Pt verbalized understanding

## 2018-10-28 ENCOUNTER — Encounter (HOSPITAL_BASED_OUTPATIENT_CLINIC_OR_DEPARTMENT_OTHER)
Admission: RE | Disposition: A | Discharge status: Home / Self Care | Payer: Self-pay | Source: Home / Self Care | Attending: Orthopaedic Surgery

## 2018-10-28 ENCOUNTER — Encounter (HOSPITAL_BASED_OUTPATIENT_CLINIC_OR_DEPARTMENT_OTHER): Payer: Self-pay | Admitting: Anesthesiology

## 2018-10-28 ENCOUNTER — Other Ambulatory Visit: Payer: Self-pay

## 2018-10-28 ENCOUNTER — Ambulatory Visit (HOSPITAL_BASED_OUTPATIENT_CLINIC_OR_DEPARTMENT_OTHER): Payer: 59 | Admitting: Anesthesiology

## 2018-10-28 ENCOUNTER — Ambulatory Visit (HOSPITAL_BASED_OUTPATIENT_CLINIC_OR_DEPARTMENT_OTHER)
Admission: RE | Admit: 2018-10-28 | Discharge: 2018-10-28 | Disposition: A | Discharge status: Home / Self Care | Payer: 59 | Attending: Orthopaedic Surgery | Admitting: Orthopaedic Surgery

## 2018-10-28 DIAGNOSIS — M25562 Pain in left knee: Secondary | ICD-10-CM | POA: Diagnosis not present

## 2018-10-28 DIAGNOSIS — Z7982 Long term (current) use of aspirin: Secondary | ICD-10-CM | POA: Insufficient documentation

## 2018-10-28 DIAGNOSIS — S83242A Other tear of medial meniscus, current injury, left knee, initial encounter: Secondary | ICD-10-CM | POA: Diagnosis present

## 2018-10-28 DIAGNOSIS — Z791 Long term (current) use of non-steroidal anti-inflammatories (NSAID): Secondary | ICD-10-CM | POA: Diagnosis not present

## 2018-10-28 DIAGNOSIS — Z79899 Other long term (current) drug therapy: Secondary | ICD-10-CM | POA: Diagnosis not present

## 2018-10-28 DIAGNOSIS — G4730 Sleep apnea, unspecified: Secondary | ICD-10-CM | POA: Diagnosis not present

## 2018-10-28 DIAGNOSIS — K219 Gastro-esophageal reflux disease without esophagitis: Secondary | ICD-10-CM | POA: Insufficient documentation

## 2018-10-28 HISTORY — PX: KNEE ARTHROSCOPY WITH MEDIAL MENISECTOMY: SHX5651

## 2018-10-28 HISTORY — DX: Hyperlipidemia, unspecified: E78.5

## 2018-10-28 HISTORY — DX: Sleep apnea, unspecified: G47.30

## 2018-10-28 HISTORY — PX: CHONDROPLASTY: SHX5177

## 2018-10-28 SURGERY — ARTHROSCOPY, KNEE, WITH MEDIAL MENISCECTOMY
Anesthesia: General | Site: Knee | Laterality: Left

## 2018-10-28 MED ORDER — FENTANYL CITRATE (PF) 100 MCG/2ML IJ SOLN: 25.0000 ug | INTRAMUSCULAR | Status: DC | PRN

## 2018-10-28 MED ORDER — LIDOCAINE 2% (20 MG/ML) 5 ML SYRINGE
INTRAMUSCULAR | Status: AC
  Filled 2018-10-28: qty 5

## 2018-10-28 MED ORDER — DEXAMETHASONE SODIUM PHOSPHATE 10 MG/ML IJ SOLN
INTRAMUSCULAR | Status: AC
  Filled 2018-10-28: qty 1

## 2018-10-28 MED ORDER — SODIUM CHLORIDE 0.9 % IR SOLN
Status: DC | PRN
  Administered 2018-10-28: 6000 mL

## 2018-10-28 MED ORDER — LIDOCAINE 2% (20 MG/ML) 5 ML SYRINGE
INTRAMUSCULAR | Status: DC | PRN
  Administered 2018-10-28: 50 mg via INTRAVENOUS

## 2018-10-28 MED ORDER — MIDAZOLAM HCL 2 MG/2ML IJ SOLN
1.0000 mg | INTRAMUSCULAR | Status: DC | PRN
  Administered 2018-10-28: 2 mg via INTRAVENOUS

## 2018-10-28 MED ORDER — DEXAMETHASONE SODIUM PHOSPHATE 4 MG/ML IJ SOLN
INTRAMUSCULAR | Status: DC | PRN
  Administered 2018-10-28: 10 mg via INTRAVENOUS

## 2018-10-28 MED ORDER — ONDANSETRON HCL 4 MG/2ML IJ SOLN
INTRAMUSCULAR | Status: AC
  Filled 2018-10-28: qty 2

## 2018-10-28 MED ORDER — EPINEPHRINE PF 1 MG/ML IJ SOLN
INTRAMUSCULAR | Status: AC
  Filled 2018-10-28: qty 1

## 2018-10-28 MED ORDER — FENTANYL CITRATE (PF) 100 MCG/2ML IJ SOLN
INTRAMUSCULAR | Status: AC
  Filled 2018-10-28: qty 2

## 2018-10-28 MED ORDER — EPHEDRINE 5 MG/ML INJ
INTRAVENOUS | Status: AC
  Filled 2018-10-28: qty 10

## 2018-10-28 MED ORDER — EPHEDRINE SULFATE 50 MG/ML IJ SOLN
INTRAMUSCULAR | Status: DC | PRN
  Administered 2018-10-28: 15 mg via INTRAVENOUS

## 2018-10-28 MED ORDER — FENTANYL CITRATE (PF) 100 MCG/2ML IJ SOLN
50.0000 ug | INTRAMUSCULAR | Status: DC | PRN
  Administered 2018-10-28: 50 ug via INTRAVENOUS
  Administered 2018-10-28: 100 ug via INTRAVENOUS

## 2018-10-28 MED ORDER — ONDANSETRON HCL 4 MG PO TABS: 4.0000 mg | ORAL_TABLET | Freq: Three times a day (TID) | ORAL | 1 refills | Status: AC | PRN

## 2018-10-28 MED ORDER — METOCLOPRAMIDE HCL 5 MG/ML IJ SOLN: 10.0000 mg | Freq: Once | INTRAMUSCULAR | Status: DC | PRN

## 2018-10-28 MED ORDER — PROPOFOL 500 MG/50ML IV EMUL
INTRAVENOUS | Status: AC
  Filled 2018-10-28: qty 50

## 2018-10-28 MED ORDER — BUPIVACAINE-EPINEPHRINE (PF) 0.25% -1:200000 IJ SOLN
INTRAMUSCULAR | Status: AC
  Filled 2018-10-28: qty 30

## 2018-10-28 MED ORDER — CEFAZOLIN SODIUM-DEXTROSE 2-4 GM/100ML-% IV SOLN
2.0000 g | INTRAVENOUS | Status: AC
  Administered 2018-10-28: 07:00:00 2 g via INTRAVENOUS

## 2018-10-28 MED ORDER — ASPIRIN 81 MG PO CHEW: 81.0000 mg | CHEWABLE_TABLET | Freq: Two times a day (BID) | ORAL | 11 refills | Status: DC

## 2018-10-28 MED ORDER — PROPOFOL 10 MG/ML IV BOLUS
INTRAVENOUS | Status: DC | PRN
  Administered 2018-10-28: 200 mg via INTRAVENOUS

## 2018-10-28 MED ORDER — BUPIVACAINE HCL (PF) 0.25 % IJ SOLN
INTRAMUSCULAR | Status: DC | PRN
  Administered 2018-10-28: 20 mL

## 2018-10-28 MED ORDER — LACTATED RINGERS IV SOLN
INTRAVENOUS | Status: DC
  Administered 2018-10-28: 07:00:00 via INTRAVENOUS

## 2018-10-28 MED ORDER — OXYCODONE HCL 5 MG PO TABS: ORAL_TABLET | ORAL | 0 refills | Status: AC

## 2018-10-28 MED ORDER — ONDANSETRON HCL 4 MG/2ML IJ SOLN
INTRAMUSCULAR | Status: DC | PRN
  Administered 2018-10-28: 4 mg via INTRAVENOUS

## 2018-10-28 MED ORDER — BUPIVACAINE HCL (PF) 0.25 % IJ SOLN
INTRAMUSCULAR | Status: AC
  Filled 2018-10-28: qty 120

## 2018-10-28 MED ORDER — MELOXICAM 7.5 MG PO TABS: 7.5000 mg | ORAL_TABLET | Freq: Every day | ORAL | 2 refills | Status: DC

## 2018-10-28 MED ORDER — ACETAMINOPHEN 500 MG PO TABS: 1000.0000 mg | ORAL_TABLET | Freq: Three times a day (TID) | ORAL | 0 refills | Status: AC

## 2018-10-28 MED ORDER — CHLORHEXIDINE GLUCONATE 4 % EX LIQD: 60.0000 mL | Freq: Once | CUTANEOUS | Status: DC

## 2018-10-28 MED ORDER — MIDAZOLAM HCL 2 MG/2ML IJ SOLN
INTRAMUSCULAR | Status: AC
  Filled 2018-10-28: qty 2

## 2018-10-28 MED ORDER — CEFAZOLIN SODIUM-DEXTROSE 2-4 GM/100ML-% IV SOLN
INTRAVENOUS | Status: AC
  Filled 2018-10-28: qty 100

## 2018-10-28 MED ORDER — SODIUM CHLORIDE 0.9 % IR SOLN
Status: DC | PRN
  Administered 2018-10-28: 2 mL

## 2018-10-28 MED ORDER — MEPERIDINE HCL 25 MG/ML IJ SOLN: 6.2500 mg | INTRAMUSCULAR | Status: DC | PRN

## 2018-10-28 MED ORDER — LACTATED RINGERS IV SOLN: INTRAVENOUS | Status: DC

## 2018-10-28 SURGICAL SUPPLY — 38 items
BANDAGE ACE 6X5 VEL STRL LF (GAUZE/BANDAGES/DRESSINGS) ×2 IMPLANT
BANDAGE ESMARK 6X9 LF (GAUZE/BANDAGES/DRESSINGS) IMPLANT
BENZOIN TINCTURE PRP APPL 2/3 (GAUZE/BANDAGES/DRESSINGS) IMPLANT
BLADE CLIPPER SURG (BLADE) ×1 IMPLANT
BNDG ESMARK 6X9 LF (GAUZE/BANDAGES/DRESSINGS) ×2
CHLORAPREP W/TINT 26 (MISCELLANEOUS) ×2 IMPLANT
CLSR STERI-STRIP ANTIMIC 1/2X4 (GAUZE/BANDAGES/DRESSINGS) ×2 IMPLANT
CUFF TOURN SGL QUICK 34 (TOURNIQUET CUFF)
CUFF TRNQT CYL 34X4.125X (TOURNIQUET CUFF) ×1 IMPLANT
DISSECTOR 3.5MM X 13CM CVD (MISCELLANEOUS) IMPLANT
DISSECTOR 4.0MMX13CM CVD (MISCELLANEOUS) ×1 IMPLANT
DRAPE ARTHROSCOPY W/POUCH 90 (DRAPES) ×2 IMPLANT
DRAPE IMP U-DRAPE 54X76 (DRAPES) ×2 IMPLANT
DRAPE U-SHAPE 47X51 STRL (DRAPES) ×2 IMPLANT
GAUZE SPONGE 4X4 12PLY STRL (GAUZE/BANDAGES/DRESSINGS) ×2 IMPLANT
GLOVE BIO SURGEON STRL SZ7 (GLOVE) ×1 IMPLANT
GLOVE BIOGEL PI IND STRL 7.5 (GLOVE) IMPLANT
GLOVE BIOGEL PI IND STRL 8 (GLOVE) ×1 IMPLANT
GLOVE BIOGEL PI INDICATOR 7.5 (GLOVE) ×1
GLOVE BIOGEL PI INDICATOR 8 (GLOVE) ×2
GLOVE ECLIPSE 8.0 STRL XLNG CF (GLOVE) ×4 IMPLANT
GOWN STRL REUS W/ TWL LRG LVL3 (GOWN DISPOSABLE) ×1 IMPLANT
GOWN STRL REUS W/TWL LRG LVL3 (GOWN DISPOSABLE) ×1
GOWN STRL REUS W/TWL XL LVL3 (GOWN DISPOSABLE) ×2 IMPLANT
KIT TURNOVER KIT B (KITS) ×2 IMPLANT
MANIFOLD NEPTUNE II (INSTRUMENTS) ×1 IMPLANT
NDL SAFETY ECLIPSE 18X1.5 (NEEDLE) ×1 IMPLANT
NEEDLE HYPO 18GX1.5 SHARP (NEEDLE) ×1
NS IRRIG 1000ML POUR BTL (IV SOLUTION) IMPLANT
PACK ARTHROSCOPY DSU (CUSTOM PROCEDURE TRAY) ×2 IMPLANT
PORT APPOLLO RF 90DEGREE MULTI (SURGICAL WAND) IMPLANT
SLEEVE SCD COMPRESS KNEE MED (MISCELLANEOUS) ×2 IMPLANT
SUT MNCRL AB 4-0 PS2 18 (SUTURE) ×2 IMPLANT
SYR 5ML LUER SLIP (SYRINGE) ×2 IMPLANT
TOWEL GREEN STERILE FF (TOWEL DISPOSABLE) ×2 IMPLANT
TUBE CONNECTING 20X1/4 (TUBING) ×2 IMPLANT
TUBING ARTHROSCOPY IRRIG 16FT (MISCELLANEOUS) ×2 IMPLANT
WATER STERILE IRR 1000ML POUR (IV SOLUTION) ×2 IMPLANT

## 2018-10-28 NOTE — H&P (Signed)
PREOPERATIVE H&P  Chief Complaint: TERA OF MEDIA MENISCUS, CURRENT INJURY  HPI: Brandon Riley is a 58 y.o. male who presents for preoperative history and physical with a diagnosis of TERA OF MEDIA MENISCUS, CURRENT INJURY. Symptoms are rated as moderate to severe, and have been worsening.  This is significantly impairing activities of daily living.  Please see my clinic note for full details on this patient's care.  He has elected for surgical management.   Past Medical History:  Diagnosis Date  . GERD (gastroesophageal reflux disease)   . Hyperlipemia   . Restless leg syndrome   . Sleep apnea    w cpap   Past Surgical History:  Procedure Laterality Date  . RECONSTRUCTION OF NOSE  2019  . WISDOM TOOTH EXTRACTION  2012   Social History   Socioeconomic History  . Marital status: Married    Spouse name: Not on file  . Number of children: Not on file  . Years of education: Not on file  . Highest education level: Not on file  Occupational History  . Not on file  Social Needs  . Financial resource strain: Not on file  . Food insecurity    Worry: Not on file    Inability: Not on file  . Transportation needs    Medical: Not on file    Non-medical: Not on file  Tobacco Use  . Smoking status: Never Smoker  . Smokeless tobacco: Never Used  Substance and Sexual Activity  . Alcohol use: Yes    Comment: 2-3x per week  . Drug use: Never  . Sexual activity: Not on file  Lifestyle  . Physical activity    Days per week: Not on file    Minutes per session: Not on file  . Stress: Not on file  Relationships  . Social Musicianconnections    Talks on phone: Not on file    Gets together: Not on file    Attends religious service: Not on file    Active member of club or organization: Not on file    Attends meetings of clubs or organizations: Not on file    Relationship status: Not on file  Other Topics Concern  . Not on file  Social History Narrative  . Not on file   History reviewed. No  pertinent family history. Allergies  Allergen Reactions  . Celebrex [Celecoxib] Palpitations    Heavy breathing  . Flexeril [Cyclobenzaprine] Hives   Prior to Admission medications   Medication Sig Start Date End Date Taking? Authorizing Provider  Cholecalciferol (VITAMIN D3) 1000 units CAPS Take 3,000 Units by mouth daily.   Yes [provider]  citalopram (CELEXA) 10 MG tablet Take 1 tablet (10 mg total) by mouth daily. 09/29/17  Yes Monica Bectonhekkekandam, Thomas J, MD  COLCRYS 0.6 MG tablet 1 tab p.o. twice daily for the first week then daily as needed 08/16/18  Yes Monica Bectonhekkekandam, Thomas J, MD  Mag Aspart-Potassium Aspart (RA POTASSIUM/MAGNESIUM) 250-250 MG CAPS Take 1 capsule by mouth.   Yes [provider]  omeprazole (PRILOSEC) 40 MG capsule TAKE ONE CAPSULE BY MOUTH DAILY 10/25/18  Yes Monica Bectonhekkekandam, Thomas J, MD  rOPINIRole (REQUIP) 0.25 MG tablet TAKE ONE TABLET BY MOUTH EVERY NIGHT AT BEDTIME INCREASE BY .25MG  EVERY 2-3 DAYS UNTIL RELIEF IS OBTAINED 09/29/17  Yes Monica Bectonhekkekandam, Thomas J, MD  rosuvastatin (CRESTOR) 10 MG tablet Take 1 tablet (10 mg total) by mouth daily. 04/09/18  Yes Monica Bectonhekkekandam, Thomas J, MD  traZODone (DESYREL) 50 MG  tablet Take 0.5 tablets (25 mg total) by mouth at bedtime. 09/29/17  Yes Silverio Decamp, MD     Positive ROS: All other systems have been reviewed and were otherwise negative with the exception of those mentioned in the HPI and as above.  Physical Exam: General: Alert, no acute distress Cardiovascular: No pedal edema Respiratory: No cyanosis, no use of accessory musculature GI: No organomegaly, abdomen is soft and non-tender Skin: No lesions in the area of chief complaint Neurologic: Sensation intact distally Psychiatric: Patient is competent for consent with normal mood and affect Lymphatic: No axillary or cervical lymphadenopathy  MUSCULOSKELETAL: L knee ttp  Assessment: TERA OF MEDIA MENISCUS, CURRENT INJURY  Plan: Plan for  Procedure(s): LEFT KNEE ARTHROSCOPY WITH MEDIAL MENISECTOMY  The risks benefits and alternatives were discussed with the patient including but not limited to the risks of nonoperative treatment, versus surgical intervention including infection, bleeding, nerve injury,  blood clots, cardiopulmonary complications, morbidity, mortality, among others, and they were willing to proceed.   Hiram Gash, MD  10/28/2018 7:07 AM

## 2018-10-28 NOTE — Transfer of Care (Signed)
Immediate Anesthesia Transfer of Care Note  Patient: Brandon Riley  Procedure(s) Performed: LEFT KNEE ARTHROSCOPY WITH MEDIAL MENISECTOMY (Left Knee) CHONDROPLASTY (Left Knee)  Patient Location: PACU  Anesthesia Type:General  Level of Consciousness: awake and sedated  Airway & Oxygen Therapy: Patient Spontanous Breathing and Patient connected to nasal cannula oxygen  Post-op Assessment: Report given to RN and Post -op Vital signs reviewed and stable  Post vital signs: Reviewed and stable  Last Vitals:  Vitals Value Taken Time  BP 115/64 10/28/18 0812  Temp    Pulse 85 10/28/18 0815  Resp 26 10/28/18 0815  SpO2 94 % 10/28/18 0815  Vitals shown include unvalidated device data.  Last Pain:  Vitals:   10/28/18 0634  TempSrc: Oral  PainSc: 3       Patients Stated Pain Goal: 3 (98/92/11 9417)  Complications: No apparent anesthesia complications

## 2018-10-28 NOTE — Anesthesia Procedure Notes (Signed)
Procedure Name: LMA Insertion Performed by: Jyl Chico W, CRNA Pre-anesthesia Checklist: Patient identified, Emergency Drugs available, Suction available and Patient being monitored Patient Re-evaluated:Patient Re-evaluated prior to induction Oxygen Delivery Method: Circle system utilized Preoxygenation: Pre-oxygenation with 100% oxygen Induction Type: IV induction Ventilation: Mask ventilation without difficulty LMA: LMA inserted LMA Size: 5.0 Number of attempts: 1 Placement Confirmation: positive ETCO2 Tube secured with: Tape Dental Injury: Teeth and Oropharynx as per pre-operative assessment        

## 2018-10-28 NOTE — Op Note (Signed)
Orthopaedic Surgery Operative Note (CSN: [id number])  Claria Dice  [birth year] Date of Surgery: 10/28/2018   Diagnoses:  Left medial knee pain  Procedure: Left knee medial chondroplasty patellofemoral chondroplasty   Operative Finding Successful completion of planned procedure.  Patient's MRI initially demonstrated possibility of a posterior medial meniscus tear.  Unfortunately this did not bore out to be the case.  His meniscus appeared completely normal .  His cartilage had significant grade 3 and areas of grade 4 change on the medial femoral condyle with grade 2 changes on the tibia primarily.  His meniscus was normal and we probed extensively including the posterior horn but did not find tear.  His lateral compartment was pristine and his patellofemoral apartment had some central wear on the trochlea and some central and inferior lateral changes on the tibia grade 2.  If he fails the surgery he would likely be a candidate for medial compartment arthroplasty with 1 of my partners.   Post-operative plan: The patient will be WBAT.  The patient will be dc home.  DVT prophylaxis ASA 81mg  bid.  Pain control with PRN pain medication preferring oral medicines.  Follow up plan will be scheduled in approximately 7 days for incision check.  Post-Op Diagnosis: Same Surgeons:Primary: Hiram Gash, MD Assistants: Location: [place] OR ROOM 6 Anesthesia: general with local Antibiotics: Ancef 2g preop Tourniquet time: * No tourniquets in log * Estimated Blood Loss: min Complications: None Specimens: None Implants: * No implants in log *  Indications for Surgery:   PERVIS MACINTYRE is a 58 y.o. male with left medial knee pain and mechanical symptoms fracture to nonoperative measures.  Benefits and risks of operative and nonoperative management were discussed prior to surgery with patient/guardian(s) and informed consent form was completed.  Specific risks including infection, need for additional surgery,  continued pain, post meniscectomy syndrome, stiffness and need for arthroplasty.   Procedure:   The patient was identified in the preoperative holding area where the surgical site was marked. The patient was taken to the OR where a procedural timeout was called and the above noted anesthesia was induced.  The patient was positioned supine with a leg holder.  Preoperative antibiotics were dosed.  The patient's left knee was prepped and draped in the usual sterile fashion.  A second preoperative timeout was called.      Began by making standard medial lateral portals with spinal needle localization.  We cleared the knee as above.  Gentle chondroplasty was performed of the medial femoral condyle.  Patient's meniscus on the medial side appeared completely intact including at the posterior horn.  There is no undersurface tearing.  Lateral compartment was intact.  Mild trochlear changes and some mild lateral facet patellofemoral changes grade 2.  He did have areas of grade 3 and a small area of grade 4 change in the medial femoral condyle and grade 3 changes on the tibial plateau medially..  The incision was thoroughly irrigated and closed in a single layer fashion with absorbable sutures. A sterile dressing was placed.   The patient was awoken from general anesthesia and taken to the PACU in stable condition without complication.

## 2018-10-28 NOTE — Discharge Instructions (Signed)

## 2018-10-28 NOTE — Anesthesia Postprocedure Evaluation (Signed)
Anesthesia Post Note  Patient: Brandon Riley  Procedure(s) Performed: LEFT KNEE ARTHROSCOPY WITH MEDIAL MENISECTOMY (Left Knee) CHONDROPLASTY (Left Knee)     Patient location during evaluation: PACU Anesthesia Type: General Level of consciousness: awake and alert Pain management: pain level controlled Vital Signs Assessment: post-procedure vital signs reviewed and stable Respiratory status: spontaneous breathing, nonlabored ventilation, respiratory function stable and patient connected to nasal cannula oxygen Cardiovascular status: blood pressure returned to baseline and stable Postop Assessment: no apparent nausea or vomiting Anesthetic complications: no    Last Vitals:  Vitals:   10/28/18 0845 10/28/18 0923  BP: 131/78 123/87  Pulse: 80 71  Resp: 20 20  Temp:  36.7 C  SpO2: 95% 97%    Last Pain:  Vitals:   10/28/18 0923  TempSrc:   PainSc: 0-No pain                 Montez Hageman

## 2018-10-28 NOTE — Anesthesia Preprocedure Evaluation (Addendum)
Anesthesia Evaluation  Patient identified by MRN, date of birth, ID band Patient awake    Reviewed: Allergy & Precautions, NPO status , Patient's Chart, lab work & pertinent test results  Airway Mallampati: II  TM Distance: >3 FB Neck ROM: Full    Dental no notable dental hx. (+) Chipped,    Pulmonary sleep apnea and Continuous Positive Airway Pressure Ventilation ,    Pulmonary exam normal breath sounds clear to auscultation       Cardiovascular negative cardio ROS Normal cardiovascular exam Rhythm:Regular Rate:Normal     Neuro/Psych negative neurological ROS  negative psych ROS   GI/Hepatic Neg liver ROS, GERD  Medicated,  Endo/Other  negative endocrine ROS  Renal/GU negative Renal ROS  negative genitourinary   Musculoskeletal negative musculoskeletal ROS (+)   Abdominal   Peds negative pediatric ROS (+)  Hematology negative hematology ROS (+)   Anesthesia Other Findings   Reproductive/Obstetrics negative OB ROS                            Anesthesia Physical Anesthesia Plan  ASA: II  Anesthesia Plan: General   Post-op Pain Management:    Induction: Intravenous  PONV Risk Score and Plan: 1 and Ondansetron and Treatment may vary due to age or medical condition  Airway Management Planned: LMA  Additional Equipment:   Intra-op Plan:   Post-operative Plan:   Informed Consent: I have reviewed the patients History and Physical, chart, labs and discussed the procedure including the risks, benefits and alternatives for the proposed anesthesia with the patient or authorized representative who has indicated his/her understanding and acceptance.     Dental advisory given  Plan Discussed with: CRNA  Anesthesia Plan Comments:         Anesthesia Quick Evaluation

## 2018-11-01 ENCOUNTER — Encounter (HOSPITAL_BASED_OUTPATIENT_CLINIC_OR_DEPARTMENT_OTHER): Payer: Self-pay | Admitting: Orthopaedic Surgery

## 2018-11-09 ENCOUNTER — Encounter: Payer: Self-pay | Admitting: Sports Medicine

## 2018-11-12 LAB — COMPREHENSIVE METABOLIC PANEL
AG Ratio: 1.7 (calc) (ref 1.0–2.5)
ALT: 27 U/L (ref 9–46)
AST: 18 U/L (ref 10–35)
Albumin: 4.7 g/dL (ref 3.6–5.1)
Alkaline phosphatase (APISO): 62 U/L (ref 35–144)
BUN: 18 mg/dL (ref 7–25)
CO2: 25 mmol/L (ref 20–32)
Calcium: 10 mg/dL (ref 8.6–10.3)
Chloride: 104 mmol/L (ref 98–110)
Creat: 0.97 mg/dL (ref 0.70–1.33)
Globulin: 2.7 g/dL (calc) (ref 1.9–3.7)
Glucose, Bld: 111 mg/dL — ABNORMAL HIGH (ref 65–99)
Potassium: 4 mmol/L (ref 3.5–5.3)
Sodium: 139 mmol/L (ref 135–146)
Total Bilirubin: 2.3 mg/dL — ABNORMAL HIGH (ref 0.2–1.2)
Total Protein: 7.4 g/dL (ref 6.1–8.1)

## 2018-11-12 LAB — LIPID PANEL W/REFLEX DIRECT LDL
Cholesterol: 146 mg/dL (ref <200)
HDL: 46 mg/dL (ref >=40)
LDL Cholesterol (Calc): 82 mg/dL (calc)
Non-HDL Cholesterol (Calc): 100 mg/dL (calc) (ref <130)
Total CHOL/HDL Ratio: 3.2 (calc) (ref <5.0)
Triglycerides: 89 mg/dL (ref <150)

## 2019-01-05 ENCOUNTER — Other Ambulatory Visit: Payer: Self-pay | Admitting: Sports Medicine

## 2019-01-05 DIAGNOSIS — F411 Generalized anxiety disorder: Secondary | ICD-10-CM

## 2019-01-06 ENCOUNTER — Encounter: Payer: Self-pay | Admitting: Sports Medicine

## 2019-01-14 ENCOUNTER — Encounter: Payer: Self-pay | Admitting: Sports Medicine

## 2019-01-14 ENCOUNTER — Ambulatory Visit (INDEPENDENT_AMBULATORY_CARE_PROVIDER_SITE_OTHER): Payer: 59 | Admitting: Sports Medicine

## 2019-01-14 ENCOUNTER — Other Ambulatory Visit: Payer: Self-pay

## 2019-01-14 DIAGNOSIS — Z23 Encounter for immunization: Secondary | ICD-10-CM

## 2019-01-14 NOTE — Progress Notes (Signed)
Flu shot given by nurse

## 2019-02-15 ENCOUNTER — Encounter: Payer: Self-pay | Admitting: Sports Medicine

## 2019-02-15 NOTE — Telephone Encounter (Signed)
I can absolutely do both, we just code it as both.

## 2019-02-18 ENCOUNTER — Other Ambulatory Visit: Payer: Self-pay

## 2019-02-18 ENCOUNTER — Ambulatory Visit (INDEPENDENT_AMBULATORY_CARE_PROVIDER_SITE_OTHER): Payer: 59 | Admitting: Sports Medicine

## 2019-02-18 DIAGNOSIS — L919 Hypertrophic disorder of the skin, unspecified: Secondary | ICD-10-CM | POA: Diagnosis not present

## 2019-02-18 DIAGNOSIS — H02824 Cysts of left upper eyelid: Secondary | ICD-10-CM | POA: Insufficient documentation

## 2019-02-18 DIAGNOSIS — F39 Unspecified mood [affective] disorder: Secondary | ICD-10-CM | POA: Diagnosis not present

## 2019-02-18 DIAGNOSIS — F411 Generalized anxiety disorder: Secondary | ICD-10-CM

## 2019-02-18 MED ORDER — CITALOPRAM HYDROBROMIDE 20 MG PO TABS: 20.0000 mg | ORAL_TABLET | Freq: Every day | ORAL | 3 refills | Status: DC

## 2019-02-18 NOTE — Progress Notes (Signed)
Subjective:    CC: Mood, skin tags  HPI: Brandon Riley returns, he is on Celexa, self increased to 20 mg, he notes that anxiety and depression are resolved, he did have a few episodes at work that created some frustration, he felt as though he wanted to punch someone, but he never became violent or showed it outwardly.  No suicidal or homicidal ideation.  Skin lesions: Left upper eyelid.  I reviewed the past medical history, family history, social history, surgical history, and allergies today and no changes were needed.  Please see the problem list section below in epic for further details.  Past Medical History: Past Medical History:  Diagnosis Date  . GERD (gastroesophageal reflux disease)   . Hyperlipemia   . Restless leg syndrome   . Sleep apnea    w cpap   Past Surgical History: Past Surgical History:  Procedure Laterality Date  . CHONDROPLASTY Left 10/28/2018   Procedure: CHONDROPLASTY;  Surgeon: Bjorn Pippin, MD;  Location: [facility];  Service: Orthopedics;  Laterality: Left;  . KNEE ARTHROSCOPY WITH MEDIAL MENISECTOMY Left 10/28/2018   Procedure: LEFT KNEE ARTHROSCOPY WITH MEDIAL MENISECTOMY;  Surgeon: Bjorn Pippin, MD;  Location:  SURGERY CENTER;  Service: Orthopedics;  Laterality: Left;  . RECONSTRUCTION OF NOSE  2019  . WISDOM TOOTH EXTRACTION  2012   Social History: Social History   Socioeconomic History  . Marital status: Married    Spouse name: Not on file  . Number of children: Not on file  . Years of education: Not on file  . Highest education level: Not on file  Occupational History  . Not on file  Social Needs  . Financial resource strain: Not on file  . Food insecurity    Worry: Not on file    Inability: Not on file  . Transportation needs    Medical: Not on file    Non-medical: Not on file  Tobacco Use  . Smoking status: Never Smoker  . Smokeless tobacco: Never Used  Substance and Sexual Activity  . Alcohol use: Yes   Comment: 2-3x per week  . Drug use: Never  . Sexual activity: Not on file  Lifestyle  . Physical activity    Days per week: Not on file    Minutes per session: Not on file  . Stress: Not on file  Relationships  . Social Musician on phone: Not on file    Gets together: Not on file    Attends religious service: Not on file    Active member of club or organization: Not on file    Attends meetings of clubs or organizations: Not on file    Relationship status: Not on file  Other Topics Concern  . Not on file  Social History Narrative  . Not on file   Family History: No family history on file. Allergies: Allergies  Allergen Reactions  . Celebrex [Celecoxib] Palpitations    Heavy breathing  . Flexeril [Cyclobenzaprine] Hives   Medications: See med rec.  Review of Systems: No fevers, chills, night sweats, weight loss, chest pain, or shortness of breath.   Objective:    General: Well Developed, well nourished, and in no acute distress.  Neuro: Alert and oriented x3, extra-ocular muscles intact, sensation grossly intact.  HEENT: Normocephalic, atraumatic, pupils equal round reactive to light, neck supple, no masses, no lymphadenopathy, thyroid nonpalpable.  There is a cyst on the left upper eyelid, he also has 2 skin  tags. Skin: Warm and dry, no rashes. Cardiac: Regular rate and rhythm, no murmurs rubs or gallops, no lower extremity edema.  Respiratory: Clear to auscultation bilaterally. Not using accessory muscles, speaking in full sentences.  Procedure:  Excision of subcentimeter left upper eyelid cyst Risks, benefits, and alternatives explained and consent obtained. Time out conducted. Surface prepped with alcohol. Per patient request no analgesia used Adequate anesthesia ensured. Area prepped and draped in a sterile fashion. I elevated the cyst with forceps and collected with scissors, we then achieved hemostasis with hyfrecation. Hemostasis achieved. Pt  stable.  Procedure:  Removal of 2 left upper eyelid skin tag(s). Risks, benefits, alternatives explained to patient. Consent obtained. Time out conducted. Noted no overlying induration or erythema at site of injection. No local anesthesia used per patient request. Scissors then used to excise the skin tag(s), and subsequent electrocautery with a Hyfrecator used to control minor bleeding. Antibiotic ointment applied. Wound dressed. Advised to return if increased redness, swelling, drainage, fevers, or chills.  Impression and Recommendations:    Mood disorder (HCC) Anxiety and depression are well controlled, his symptoms are more related to frustration with his job, certainly behavioral therapy would be an option but he does not have time for this, I do not think this is a job for medication, I did discuss with him that frustration was normal, and that the way he dealt with it outwardly was what was important.  Cyst of left upper eyelid Surgical excision with hyfrecation, I also removed 2 skin tags around his left eye.   ___________________________________________ Gwen Her. Dianah Field, M.D., ABFM., CAQSM. Primary Care and Sports Medicine [facility]  Adjunct Professor of [employer]

## 2019-02-18 NOTE — Assessment & Plan Note (Signed)
Anxiety and depression are well controlled, his symptoms are more related to frustration with his job, certainly behavioral therapy would be an option but he does not have time for this, I do not think this is a job for medication, I did discuss with him that frustration was normal, and that the way he dealt with it outwardly was what was important.

## 2019-02-18 NOTE — Assessment & Plan Note (Signed)
Surgical excision with hyfrecation, I also removed 2 skin tags around his left eye.

## 2019-04-03 ENCOUNTER — Other Ambulatory Visit: Payer: Self-pay | Admitting: Sports Medicine

## 2019-04-03 DIAGNOSIS — E7800 Pure hypercholesterolemia, unspecified: Secondary | ICD-10-CM

## 2019-06-20 ENCOUNTER — Other Ambulatory Visit: Payer: Self-pay | Admitting: Sports Medicine

## 2019-06-20 DIAGNOSIS — F39 Unspecified mood [affective] disorder: Secondary | ICD-10-CM

## 2019-06-20 DIAGNOSIS — F411 Generalized anxiety disorder: Secondary | ICD-10-CM

## 2019-09-19 ENCOUNTER — Other Ambulatory Visit: Payer: Self-pay | Admitting: Sports Medicine

## 2019-09-19 DIAGNOSIS — F411 Generalized anxiety disorder: Secondary | ICD-10-CM

## 2019-09-19 DIAGNOSIS — F39 Unspecified mood [affective] disorder: Secondary | ICD-10-CM

## 2019-12-31 ENCOUNTER — Other Ambulatory Visit: Payer: Self-pay | Admitting: Sports Medicine

## 2019-12-31 DIAGNOSIS — E7800 Pure hypercholesterolemia, unspecified: Secondary | ICD-10-CM

## 2020-01-03 ENCOUNTER — Other Ambulatory Visit: Payer: Self-pay | Admitting: Sports Medicine

## 2020-01-30 ENCOUNTER — Emergency Department (INDEPENDENT_AMBULATORY_CARE_PROVIDER_SITE_OTHER): Payer: 59

## 2020-01-30 ENCOUNTER — Other Ambulatory Visit: Payer: Self-pay

## 2020-01-30 ENCOUNTER — Emergency Department (INDEPENDENT_AMBULATORY_CARE_PROVIDER_SITE_OTHER)
Admission: EM | Admit: 2020-01-30 | Discharge: 2020-01-30 | Disposition: A | Discharge status: Home / Self Care | Payer: 59 | Source: Home / Self Care

## 2020-01-30 DIAGNOSIS — S62502B Fracture of unspecified phalanx of left thumb, initial encounter for open fracture: Secondary | ICD-10-CM | POA: Diagnosis not present

## 2020-01-30 DIAGNOSIS — S62512A Displaced fracture of proximal phalanx of left thumb, initial encounter for closed fracture: Secondary | ICD-10-CM

## 2020-01-30 DIAGNOSIS — W298XXA Contact with other powered powered hand tools and household machinery, initial encounter: Secondary | ICD-10-CM

## 2020-01-30 MED ORDER — TETANUS-DIPHTH-ACELL PERTUSSIS 5-2.5-18.5 LF-MCG/0.5 IM SUSP
0.5000 mL | Freq: Once | INTRAMUSCULAR | Status: AC
  Administered 2020-01-30: 0.5 mL via INTRAMUSCULAR

## 2020-01-30 NOTE — Discharge Instructions (Signed)
  Due to the severity of your open fracture, please have your wife drive you to the hospital for further evaluation and treatment of your injury, likely need consult with a hand surgeon this evening.

## 2020-01-30 NOTE — ED Provider Notes (Signed)
[facility]    CSN: [id number] Arrival date & time: 01/30/20  1748      History   Chief Complaint Chief Complaint  Patient presents with  . Finger Injury    LT thumb    HPI Brandon Riley is a 59 y.o. male.   HPI Brandon Riley is a 59 y.o. male presenting to UC with c/o laceration to Left thumb that occurred just PTA.  Pt got his thumb caught in a drill bit, states the metal plate used to hold the wood he was working on came loose and spun, causing his thumb to be penetrated by the nail bit.  Wound oozing blood, controled by light pressure.  Slight decreased sensation at tip of his thumb. He is Right hand dominant. Pain is 2/10. Last tetanus in 2015.    Past Medical History:  Diagnosis Date  . GERD (gastroesophageal reflux disease)   . Hyperlipemia   . Restless leg syndrome   . Sleep apnea    w cpap    Patient Active Problem List   Diagnosis Date Noted  . Cyst of left upper eyelid 02/18/2019  . Primary osteoarthritis with pseudogout of left knee 07/15/2018  . Bilateral lower extremity edema 04/02/2018  . Subcutaneous nodule of back 04/02/2018  . Fracture, facial bones (HCC) 06/04/2017  . GERD (gastroesophageal reflux disease) 11/07/2016  . Closed chip fracture of triquetrum of right wrist 09/29/2016  . Primary osteoarthritis of right wrist 09/29/2016  . Mood disorder (HCC) 12/17/2015  . Left insertional Achilles tendinosis 11/05/2015  . 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 Obstructive sleep apnea on CPAP with periodic limb movement disorder 06/05/2014  . Obesity 05/02/2013  . Annual physical exam 04/04/2013  . Right knee pain 04/04/2013  . Degenerative disc disease, cervical 04/04/2013  . Hyperlipidemia 04/04/2013  . Lumbar degenerative disc disease 04/04/2013    Past Surgical History:  Procedure Laterality Date  . CHONDROPLASTY Left 10/28/2018   Procedure: CHONDROPLASTY;  Surgeon: Bjorn Pippin, MD;  Location: [facility];   Service: Orthopedics;  Laterality: Left;  . KNEE ARTHROSCOPY WITH MEDIAL MENISECTOMY Left 10/28/2018   Procedure: LEFT KNEE ARTHROSCOPY WITH MEDIAL MENISECTOMY;  Surgeon: Bjorn Pippin, MD;  Location: [facility];  Service: Orthopedics;  Laterality: Left;  . RECONSTRUCTION OF NOSE  2019  . WISDOM TOOTH EXTRACTION  2012       Home Medications    Prior to Admission medications   Medication Sig Start Date End Date Taking? Authorizing Provider  citalopram (CELEXA) 20 MG tablet TAKE ONE TABLET BY MOUTH DAILY 09/19/19   Monica Becton, MD  omeprazole (PRILOSEC) 40 MG capsule TAKE ONE CAPSULE BY MOUTH DAILY 10/25/18   Monica Becton, MD  rOPINIRole (REQUIP) 0.25 MG tablet TAKE ONE TABLET BY MOUTH EVERY NIGHT AT BEDTIME. INCREASE BY 1 TABLET EVERY 2 TO 3 DAYS UNTIL RELIEF IS OBTAINED 01/06/19   Monica Becton, MD  rosuvastatin (CRESTOR) 10 MG tablet TAKE ONE TABLET BY MOUTH DAILY 12/31/19   Monica Becton, MD  traZODone (DESYREL) 50 MG tablet TAKE 1/2 TABLET BY MOUTH AT BEDTIME 01/03/20   Monica Becton, MD    Family History History reviewed. No pertinent family history.  Social History Social History   Tobacco Use  . Smoking status: Never Smoker  . Smokeless tobacco: Never Used  Vaping Use  . Vaping Use: Never used  Substance Use Topics  . Alcohol use: Yes  Comment: 2-3x per week  . Drug use: Never     Allergies   Celebrex [celecoxib] and Flexeril [cyclobenzaprine]   Review of Systems Review of Systems  Musculoskeletal: Positive for arthralgias and joint swelling.  Skin: Positive for color change and wound.  Neurological: Positive for weakness and numbness.     Physical Exam Triage Vital Signs ED Triage Vitals  Enc Vitals Group     BP 01/30/20 1759 (!) 147/98     Pulse Rate 01/30/20 1759 85     Resp 01/30/20 1759 17     Temp --      Temp src --      SpO2 01/30/20 1759 97 %     Weight --      Height --      Head  Circumference --      Peak Flow --      Pain Score 01/30/20 1757 2     Pain Loc --      Pain Edu? --      Excl. in GC? --    No data found.  Updated Vital Signs BP (!) 147/98 (BP Location: Right Arm)   Pulse 85   Resp 17   SpO2 97%   Visual Acuity Right Eye Distance:   Left Eye Distance:   Bilateral Distance:    Right Eye Near:   Left Eye Near:    Bilateral Near:     Physical Exam Vitals and nursing note reviewed.  Constitutional:      Appearance: Normal appearance. He is well-developed.  HENT:     Head: Normocephalic and atraumatic.  Cardiovascular:     Rate and Rhythm: Normal rate.  Pulmonary:     Effort: Pulmonary effort is normal.  Musculoskeletal:        General: Swelling and tenderness present.     Cervical back: Normal range of motion.     Comments: Left thumb: slight deformity at PIP, limited flexion. Deep wound, tendon vs bone exposed.   Skin:    General: Skin is warm and dry.     Capillary Refill: Capillary refill takes less than 2 seconds.  Neurological:     Mental Status: He is alert and oriented to person, place, and time.     Sensory: Sensory deficit (slight decreased sensation distal thumb compared to other fingers on same hand) present.  Psychiatric:        Behavior: Behavior normal.      UC Treatments / Results  Labs (all labs ordered are listed, but only abnormal results are displayed) Labs Reviewed - No data to display  EKG   Radiology DG Finger Thumb Left  Result Date: 01/30/2020 CLINICAL DATA:  Thumb puncture wound, laceration, and decreased range of motion. Drilled into left thumb this evening. EXAM: LEFT THUMB 2+V COMPARISON:  None. FINDINGS: Multiple fracture fragments are seen about the thumb interphalangeal joint, largest measuring 10 mm is displaced radially, donor site felt to be the proximal phalanx, possibly involving the interphalangeal joint articular surface. Fracture fragment approaches the skin surface. No definite distal  phalanx fracture. Degenerative spurring of the distal phalanx. There is no overlying dressing in place. IMPRESSION: Multiple fracture fragments about the thumb interphalangeal joint with displacement, largest measuring 10 mm. Donor site felt to be the distal aspect of the proximal phalanx, and may extend to the interphalangeal joint articular surface. Fracture fragment approaches the skin surface, possible open fracture. Electronically Signed   By: Narda Rutherford M.D.   On: 01/30/2020  18:45    Procedures Procedures (including critical care time)  Medications Ordered in UC Medications  Tdap (BOOSTRIX) injection 0.5 mL (0.5 mLs Intramuscular Given 01/30/20 1809)    Initial Impression / Assessment and Plan / UC Course  I have reviewed the triage vital signs and the nursing notes.  Pertinent labs & imaging results that were available during my care of the patient were reviewed by me and considered in my medical decision making (see chart for details).     Tetanus Updated in UC this evening.  Wound lightly cleaned with gauze, wrapped with guaze and coban to control bleeding and keep clean/protected.  Hx and exam c/w open fracture with multiple fracture fragments in Left thumb. Advised pt to go to hospital this evening for further evaluation and treatment, will likely need hand surgery. Pt understanding and agreeable with plan. Pt's wife drove him to [facility], will drive him to [facility] for further evaluation and treatment.   Final Clinical Impressions(s) / UC Diagnoses   Final diagnoses:  Open fracture dislocation of left thumb, initial encounter     Discharge Instructions      Due to the severity of your open fracture, please have your wife drive you to the hospital for further evaluation and treatment of your injury, likely need consult with a hand surgeon this evening.     ED Prescriptions    None     PDMP not reviewed this encounter.   Lurene Shadow, [place] 01/30/20  1900

## 2020-01-30 NOTE — ED Triage Notes (Signed)
Pt c/o injury to LT thumb when drill bit penetrated his thumb. Does not believe it penetrated all the way through. Says its numb but is able to move his thumb. Pain 2/10 Unsure if tdap is up to date.

## 2020-01-30 NOTE — ED Notes (Signed)
Patient is being discharged from the Urgent Care and sent to the Emergency Department via POV . Per Waylan Rocher [facility], patient is in need of higher level of care due to open fracture and possible and need for hand specialty consult. Patient is aware and verbalizes understanding of plan of care.  Vitals:   01/30/20 1759  BP: (!) 147/98  Pulse: 85  Resp: 17  SpO2: 97%

## 2020-01-31 ENCOUNTER — Telehealth: Payer: Self-pay | Admitting: *Deleted

## 2020-01-31 DIAGNOSIS — S62502B Fracture of unspecified phalanx of left thumb, initial encounter for open fracture: Secondary | ICD-10-CM | POA: Insufficient documentation

## 2020-01-31 DIAGNOSIS — S62512B Displaced fracture of proximal phalanx of left thumb, initial encounter for open fracture: Secondary | ICD-10-CM

## 2020-01-31 NOTE — Telephone Encounter (Signed)
No problem, we can use Dr. Amanda Pea.

## 2020-01-31 NOTE — Assessment & Plan Note (Signed)
I have not seen this patient, but it looks like he has an open fracture the distal aspect of his left proximal phalanx of the thumb, I am going to just try to expedite the referral to Dr. Amanda Pea.

## 2020-01-31 NOTE — Telephone Encounter (Signed)
Pt left vm this morning requesting that you place a referral for an ortho surgery for the open fracture of his finger.

## 2020-02-01 ENCOUNTER — Ambulatory Visit (INDEPENDENT_AMBULATORY_CARE_PROVIDER_SITE_OTHER): Payer: 59 | Admitting: Sports Medicine

## 2020-02-01 DIAGNOSIS — S62512D Displaced fracture of proximal phalanx of left thumb, subsequent encounter for fracture with routine healing: Secondary | ICD-10-CM

## 2020-02-01 DIAGNOSIS — Z23 Encounter for immunization: Secondary | ICD-10-CM | POA: Diagnosis not present

## 2020-02-01 NOTE — Assessment & Plan Note (Signed)
Brandon Riley had an open fracture, this is the first time of seeing it. It sounds like he was seen in our urgent care, he was shipped out to the ED, apparently in the ED he was evaluated by a PA, the area was irrigated, and closed to the best of her abilities. He is also currently on Keflex. Hand surgery was consulted but did not come down to evaluate the patient. Today the wound looks okay, it is open with overlying simple interrupted sutures, I can see the glistening articular surface of the displaced bone underneath. This was covered with iodoform, sterile dressing, and then wrapped with Coban. I had already placed a referral to hand surgery.

## 2020-02-01 NOTE — Progress Notes (Signed)
° ° °  Procedures performed today:    None.  Independent interpretation of notes and tests performed by another provider:   None.  Brief History, Exam, Impression, and Recommendations:    Open fracture of left thumb Brandon Riley had an open fracture, this is the first time of seeing it. It sounds like he was seen in our urgent care, he was shipped out to the ED, apparently in the ED he was evaluated by a PA, the area was irrigated, and closed to the best of her abilities. He is also currently on Keflex. Hand surgery was consulted but did not come down to evaluate the patient. Today the wound looks okay, it is open with overlying simple interrupted sutures, I can see the glistening articular surface of the displaced bone underneath. This was covered with iodoform, sterile dressing, and then wrapped with Coban. I had already placed a referral to hand surgery.    ___________________________________________ Brandon Riley. Benjamin Stain, M.D., ABFM., CAQSM. Primary Care and Sports Medicine [facility]  Adjunct Instructor of Family Medicine  [employer]

## 2020-02-01 NOTE — Addendum Note (Signed)
Addended by: Monica Becton on: 02/01/2020 04:45 PM   Modules accepted: Level of Service

## 2020-02-02 NOTE — Telephone Encounter (Signed)
Patient advised, has appt tomorrow

## 2020-02-15 DIAGNOSIS — E7800 Pure hypercholesterolemia, unspecified: Secondary | ICD-10-CM

## 2020-02-15 DIAGNOSIS — N400 Enlarged prostate without lower urinary tract symptoms: Secondary | ICD-10-CM

## 2020-02-17 LAB — COMPREHENSIVE METABOLIC PANEL
AG Ratio: 1.6 (calc) (ref 1.0–2.5)
ALT: 48 U/L — ABNORMAL HIGH (ref 9–46)
AST: 34 U/L (ref 10–35)
Albumin: 4.4 g/dL (ref 3.6–5.1)
Alkaline phosphatase (APISO): 67 U/L (ref 35–144)
BUN: 15 mg/dL (ref 7–25)
CO2: 25 mmol/L (ref 20–32)
Calcium: 9.5 mg/dL (ref 8.6–10.3)
Chloride: 105 mmol/L (ref 98–110)
Creat: 0.97 mg/dL (ref 0.70–1.33)
Globulin: 2.7 g/dL (calc) (ref 1.9–3.7)
Glucose, Bld: 101 mg/dL — ABNORMAL HIGH (ref 65–99)
Potassium: 4.1 mmol/L (ref 3.5–5.3)
Sodium: 138 mmol/L (ref 135–146)
Total Bilirubin: 0.7 mg/dL (ref 0.2–1.2)
Total Protein: 7.1 g/dL (ref 6.1–8.1)

## 2020-02-17 LAB — CBC
HCT: 44.9 % (ref 38.5–50.0)
Hemoglobin: 15.2 g/dL (ref 13.2–17.1)
MCH: 30.8 pg (ref 27.0–33.0)
MCHC: 33.9 g/dL (ref 32.0–36.0)
MCV: 90.9 fL (ref 80.0–100.0)
MPV: 9.7 fL (ref 7.5–12.5)
Platelets: 208 10*3/uL (ref 140–400)
RBC: 4.94 10*6/uL (ref 4.20–5.80)
RDW: 12.7 % (ref 11.0–15.0)
WBC: 3.9 10*3/uL (ref 3.8–10.8)

## 2020-02-17 LAB — PSA, TOTAL AND FREE
PSA, % Free: 25 % (calc) — ABNORMAL LOW (ref >25)
PSA, Free: 0.3 ng/mL
PSA, Total: 1.2 ng/mL (ref <=4.0)

## 2020-02-17 LAB — LIPID PANEL
Cholesterol: 130 mg/dL (ref <200)
HDL: 38 mg/dL — ABNORMAL LOW (ref >=40)
LDL Cholesterol (Calc): 66 mg/dL (calc)
Non-HDL Cholesterol (Calc): 92 mg/dL (calc) (ref <130)
Total CHOL/HDL Ratio: 3.4 (calc) (ref <5.0)
Triglycerides: 180 mg/dL — ABNORMAL HIGH (ref <150)

## 2020-02-17 LAB — TSH: TSH: 2.1 mIU/L (ref 0.40–4.50)

## 2020-03-06 IMAGING — MR MRI OF THE LEFT KNEE WITHOUT CONTRAST
7 series · 40 of 40 positions shown · non-contrast
Comparison: Plain films left knee 07/15/2018.

CLINICAL DATA: Left knee pain for 3 months.  No known injury.

EXAM:
MRI OF THE LEFT KNEE WITHOUT CONTRAST
TECHNIQUE: Multiplanar, multisequence MR imaging of the knee was performed. No
intravenous contrast was administered.

[Series 3: T2 fat-sat · axial · 4.0mm · 0.53mm/px · z∈[-71,+99]mm · 6 of 35 slices shown (1 of 3)]
[im 1/35]
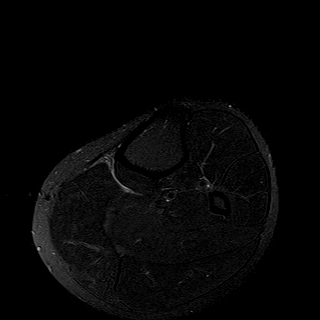
[im 7/35]
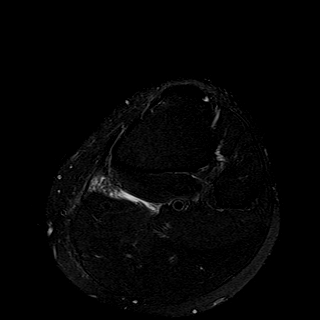
[im 14/35]
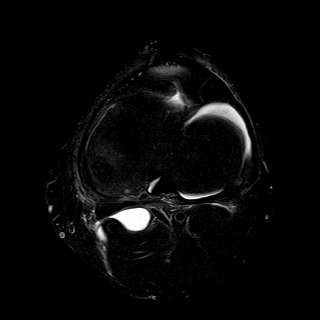
[im 21/35]
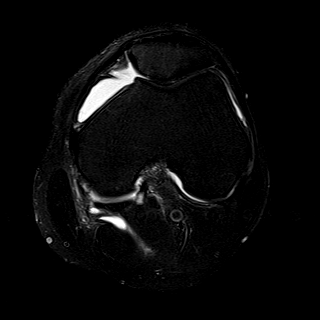
[im 28/35]
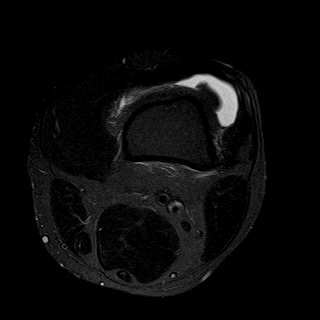
[im 35/35]
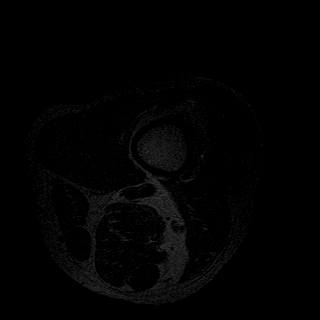

[Series 4: T1 · coronal · 4.0mm · 0.62mm/px · 6 of 31 slices shown]
[im 1/31]
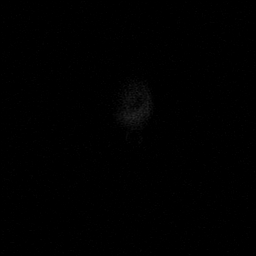
[im 7/31]
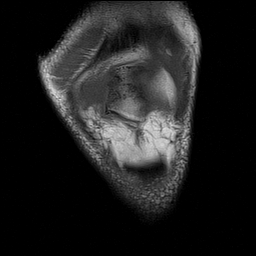
[im 13/31]
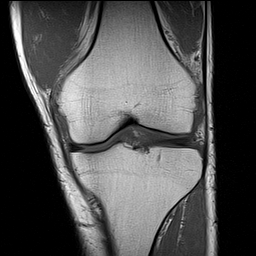
[im 19/31]
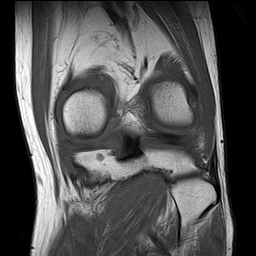
[im 25/31]
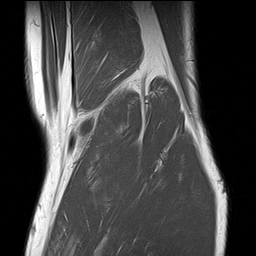
[im 31/31]
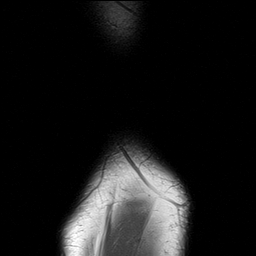

[Series 5: T2 fat-sat · coronal · 4.0mm · 0.62mm/px · 6 of 31 slices shown (2 of 3)]
[im 1/31]
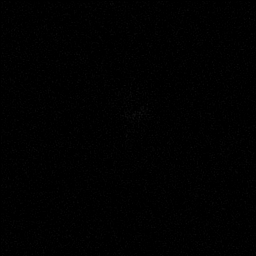
[im 7/31]
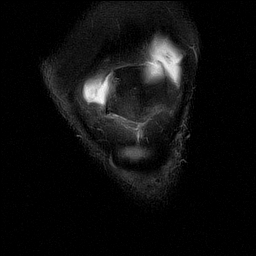
[im 13/31]
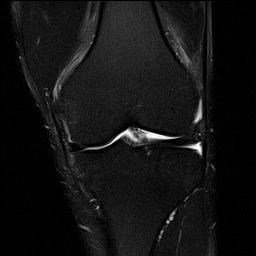
[im 19/31]
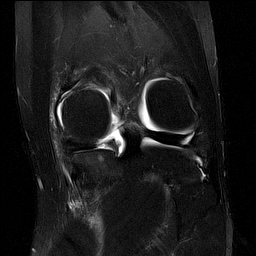
[im 25/31]
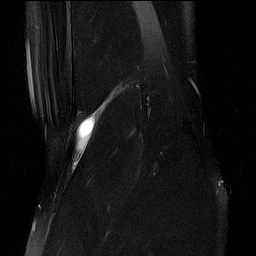
[im 31/31]
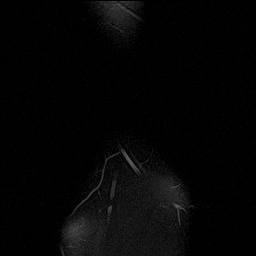

[Series 6: PD fat-sat · coronal · 4.0mm · 0.62mm/px · 6 of 31 slices shown (1 of 3)]
[im 1/31]
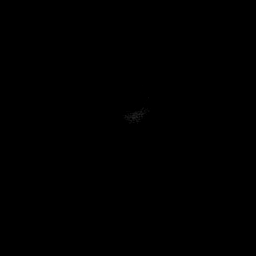
[im 7/31]
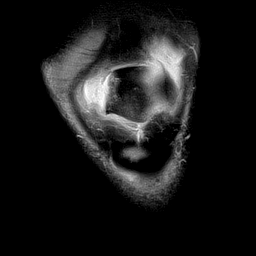
[im 13/31]
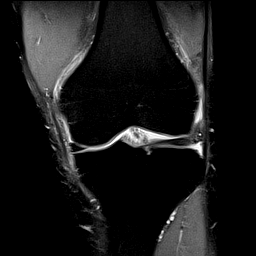
[im 19/31]
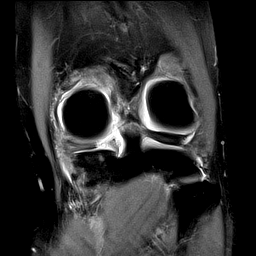
[im 25/31]
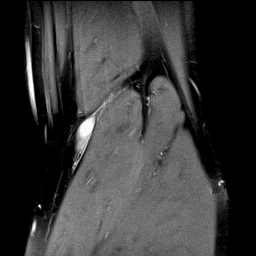
[im 31/31]
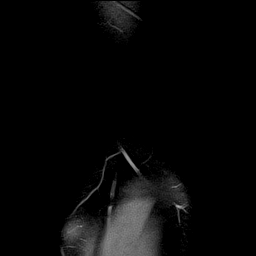

[Series 7: PD fat-sat · sagittal · 3.0mm · 0.62mm/px · 6 of 35 slices shown (2 of 3)]
[im 1/35]
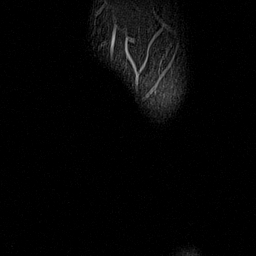
[im 7/35]
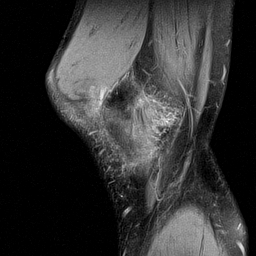
[im 14/35]
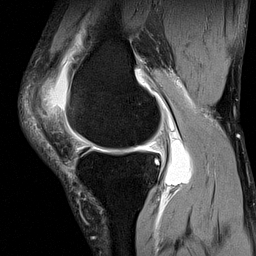
[im 21/35]
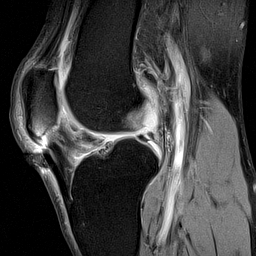
[im 28/35]
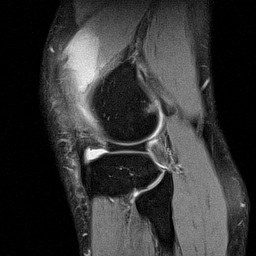
[im 35/35]
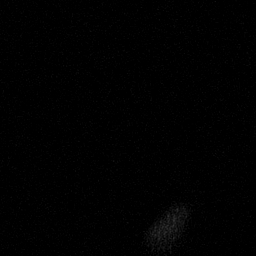

[Series 8: T2 fat-sat · sagittal · 3.0mm · 0.62mm/px · 6 of 35 slices shown (3 of 3)]
[im 1/35]
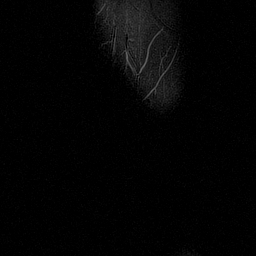
[im 7/35]
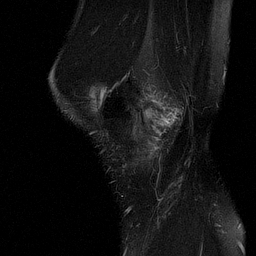
[im 14/35]
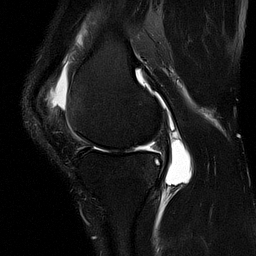
[im 21/35]
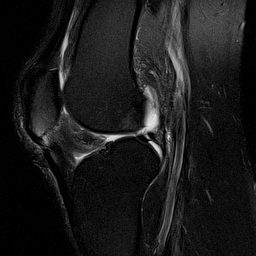
[im 28/35]
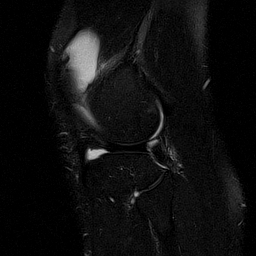
[im 35/35]
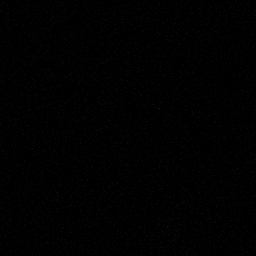

[Series 9: PD fat-sat · coronal · 2.0mm · 0.62mm/px · 4 of 19 slices shown (3 of 3)]
[im 1/19]
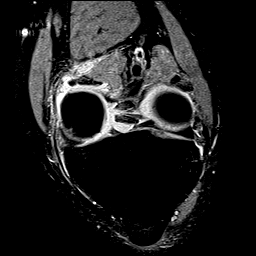
[im 7/19]
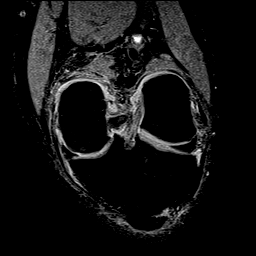
[im 13/19]
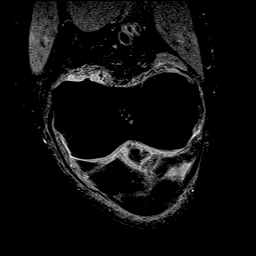
[im 19/19]
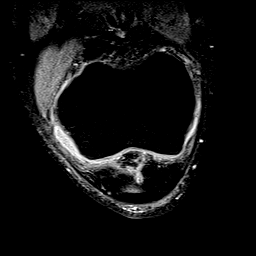

[40 of 40 positions shown; findings below may reference images not displayed]

FINDINGS: MENISCI

Medial meniscus: There is a large radial tear at and peripheral to
the root of the posterior horn of the medial meniscus. Extensive
degenerative signal is seen in the remainder of the posterior horn
and body.

Lateral meniscus:  Intact.

LIGAMENTS

Cruciates:  Intact.

Collaterals:  Intact.

CARTILAGE

Patellofemoral: Mild thinning is noted in the superior pole along
the medial facet.

Medial:  Thinned without focal defect.

Lateral:  Mildly thinned and irregular.

Joint:  Small to moderate joint effusion.

Popliteal Fossa: Baker's cyst measures approximately 6.5 cm
craniocaudal x 1.5 cm AP x 2.5 cm transverse.

Extensor Mechanism:  Intact.

Bones: No fracture or focal lesion. Mild subchondral edema is seen
about the periphery of the medial compartment.

Other: Susceptibility artifact inferior to the patella correlates
with a tiny radiopaque foreign body in the subcutaneous tissues seen
on prior plain films.
IMPRESSION: Large radial tear root of the posterior horn of the medial meniscus.

Moderate medial compartment osteoarthritis. Mild patellofemoral and
lateral compartment degenerative change also noted.

Baker's cyst.

## 2020-03-14 ENCOUNTER — Encounter: Payer: Self-pay | Admitting: Sports Medicine

## 2020-03-14 ENCOUNTER — Ambulatory Visit (INDEPENDENT_AMBULATORY_CARE_PROVIDER_SITE_OTHER): Payer: 59

## 2020-03-14 ENCOUNTER — Other Ambulatory Visit: Payer: Self-pay

## 2020-03-14 ENCOUNTER — Ambulatory Visit (INDEPENDENT_AMBULATORY_CARE_PROVIDER_SITE_OTHER): Payer: 59 | Admitting: Sports Medicine

## 2020-03-14 DIAGNOSIS — S62502D Fracture of unspecified phalanx of left thumb, subsequent encounter for fracture with routine healing: Secondary | ICD-10-CM | POA: Diagnosis not present

## 2020-03-14 DIAGNOSIS — R61 Generalized hyperhidrosis: Secondary | ICD-10-CM | POA: Diagnosis not present

## 2020-03-14 NOTE — Assessment & Plan Note (Addendum)
This is a pleasant 59 year old male, for the past 2 months he has had increasing sweats at night, his upper body is usually drenched. No chest pain or shortness of breath. About 3 months ago he stopped his citalopram, and tapered off of it himself, he does understand that withdrawal from SSRIs can result in sweating but his sweats started a good month after stopping his citalopram. He has no upper respiratory symptoms, lower respiratory symptoms, he is post left thumb percutaneous fixation of a fracture, the wound looks okay, he does have a follow-up coming up with his hand surgeon. Otherwise exam is unremarkable, heart sounds good without murmurs. We are going to have to do somewhat of a shotgun work-up, CBC, CMP, D-dimer, TSH, lymphoma panel, blood cultures, QuantiFERON gold, x-rays of his thumb, chest, urinalysis. Return to see me in approximately 4 weeks to see how things are going.  All labs are unremarkable with the exception of liver function which is slightly abnormal, probably needs to drop the drinking to 0 for now and see if that improves the sweats. We will recheck his LFTs at the follow-up visit.

## 2020-03-14 NOTE — Progress Notes (Addendum)
    Procedures performed today:    None.  Independent interpretation of notes and tests performed by another provider:   None.  Brief History, Exam, Impression, and Recommendations:    Night sweats This is a pleasant 59 year old male, for the past 2 months he has had increasing sweats at night, his upper body is usually drenched. No chest pain or shortness of breath. About 3 months ago he stopped his citalopram, and tapered off of it himself, he does understand that withdrawal from SSRIs can result in sweating but his sweats started a good month after stopping his citalopram. He has no upper respiratory symptoms, lower respiratory symptoms, he is post left thumb percutaneous fixation of a fracture, the wound looks okay, he does have a follow-up coming up with his hand surgeon. Otherwise exam is unremarkable, heart sounds good without murmurs. We are going to have to do somewhat of a shotgun work-up, CBC, CMP, D-dimer, TSH, lymphoma panel, blood cultures, QuantiFERON gold, x-rays of his thumb, chest, urinalysis. Return to see me in approximately 4 weeks to see how things are going.  All labs are unremarkable with the exception of liver function which is slightly abnormal, probably needs to drop the drinking to 0 for now and see if that improves the sweats. We will recheck his LFTs at the follow-up visit.    ___________________________________________ Ihor Austin. Benjamin Stain, M.D., ABFM., CAQSM. Primary Care and Sports Medicine [facility]  Adjunct Instructor of Family Medicine  [employer]

## 2020-03-20 LAB — URINALYSIS W MICROSCOPIC + REFLEX CULTURE
Bacteria, UA: NONE SEEN /HPF
Bilirubin Urine: NEGATIVE
Glucose, UA: NEGATIVE
Hgb urine dipstick: NEGATIVE
Hyaline Cast: NONE SEEN /LPF
Ketones, ur: NEGATIVE
Leukocyte Esterase: NEGATIVE
Nitrites, Initial: NEGATIVE
Protein, ur: NEGATIVE
RBC / HPF: NONE SEEN /HPF (ref 0–2)
Specific Gravity, Urine: 1.022 (ref 1.001–1.03)
Squamous Epithelial / HPF: NONE SEEN /HPF (ref <=5)
pH: <=5 (ref 5.0–8.0)

## 2020-03-20 LAB — CBC
HCT: 44.1 % (ref 38.5–50.0)
Hemoglobin: 15.5 g/dL (ref 13.2–17.1)
MCH: 31.4 pg (ref 27.0–33.0)
MCHC: 35.1 g/dL (ref 32.0–36.0)
MCV: 89.3 fL (ref 80.0–100.0)
MPV: 10 fL (ref 7.5–12.5)
Platelets: 202 10*3/uL (ref 140–400)
RBC: 4.94 Million/uL (ref 4.20–5.80)
RDW: 12.7 % (ref 11.0–15.0)
WBC: 4.5 10*3/uL (ref 3.8–10.8)

## 2020-03-20 LAB — SARS COV-2 SEROLOGY(COVID-19)AB(IGG,IGM),IMMUNOASSAY
SARS CoV-2 AB IgG: NEGATIVE
SARS CoV-2 IgM: NEGATIVE

## 2020-03-20 LAB — COMPREHENSIVE METABOLIC PANEL
AG Ratio: 1.9 (calc) (ref 1.0–2.5)
ALT: 75 U/L — ABNORMAL HIGH (ref 9–46)
AST: 59 U/L — ABNORMAL HIGH (ref 10–35)
Albumin: 4.9 g/dL (ref 3.6–5.1)
Alkaline phosphatase (APISO): 65 U/L (ref 35–144)
BUN: 16 mg/dL (ref 7–25)
CO2: 29 mmol/L (ref 20–32)
Calcium: 9.9 mg/dL (ref 8.6–10.3)
Chloride: 104 mmol/L (ref 98–110)
Creat: 0.94 mg/dL (ref 0.70–1.33)
Globulin: 2.6 g/dL (calc) (ref 1.9–3.7)
Glucose, Bld: 80 mg/dL (ref 65–139)
Potassium: 4.4 mmol/L (ref 3.5–5.3)
Sodium: 141 mmol/L (ref 135–146)
Total Bilirubin: 1.2 mg/dL (ref 0.2–1.2)
Total Protein: 7.5 g/dL (ref 6.1–8.1)

## 2020-03-20 LAB — D-DIMER, QUANTITATIVE: D-Dimer, Quant: 0.43 mcg/mL FEU (ref <0.50)

## 2020-03-20 LAB — LEUKEMIA/LYMPHOMA EVALUATION PANEL
NUMBER OF MARKERS:: 22
VIABILITY:: 92 %

## 2020-03-20 LAB — QUANTIFERON-TB GOLD PLUS
Mitogen-NIL: 8.06 IU/mL
NIL: 0.01 IU/mL
QuantiFERON-TB Gold Plus: NEGATIVE
TB1-NIL: 0.01 IU/mL
TB2-NIL: 0 IU/mL

## 2020-03-20 LAB — TSH: TSH: 2.67 mIU/L (ref 0.40–4.50)

## 2020-03-20 LAB — NO CULTURE INDICATED

## 2020-03-20 LAB — CULTURE, BLOOD (SINGLE): MICRO NUMBER:: 11220803

## 2020-03-20 NOTE — Addendum Note (Signed)
Addended by: Monica Becton on: 03/20/2020 06:23 AM   Modules accepted: Orders

## 2020-03-21 DIAGNOSIS — G4733 Obstructive sleep apnea (adult) (pediatric): Secondary | ICD-10-CM

## 2020-04-11 ENCOUNTER — Ambulatory Visit: Payer: 59 | Admitting: Sports Medicine

## 2020-04-17 ENCOUNTER — Other Ambulatory Visit: Payer: Self-pay | Admitting: Sports Medicine

## 2020-04-20 ENCOUNTER — Encounter: Payer: Self-pay | Admitting: Sports Medicine

## 2020-04-21 ENCOUNTER — Encounter: Payer: Self-pay | Admitting: Sports Medicine

## 2020-05-08 DIAGNOSIS — G4733 Obstructive sleep apnea (adult) (pediatric): Secondary | ICD-10-CM

## 2020-05-09 NOTE — Telephone Encounter (Signed)
Yes I did, positive for obstructive sleep apnea, I will go and place an order for CPAP with auto titration

## 2020-05-09 NOTE — Telephone Encounter (Signed)
Looks like sleep study sent to [facility], have you gotten results yet?

## 2020-05-15 ENCOUNTER — Ambulatory Visit (INDEPENDENT_AMBULATORY_CARE_PROVIDER_SITE_OTHER): Payer: 59 | Admitting: Sports Medicine

## 2020-05-15 ENCOUNTER — Encounter: Payer: Self-pay | Admitting: Sports Medicine

## 2020-05-15 ENCOUNTER — Ambulatory Visit (INDEPENDENT_AMBULATORY_CARE_PROVIDER_SITE_OTHER): Payer: 59

## 2020-05-15 ENCOUNTER — Other Ambulatory Visit: Payer: Self-pay

## 2020-05-15 DIAGNOSIS — W009XXA Unspecified fall due to ice and snow, initial encounter: Secondary | ICD-10-CM | POA: Diagnosis not present

## 2020-05-15 DIAGNOSIS — G4733 Obstructive sleep apnea (adult) (pediatric): Secondary | ICD-10-CM | POA: Diagnosis not present

## 2020-05-15 DIAGNOSIS — M25551 Pain in right hip: Secondary | ICD-10-CM | POA: Diagnosis not present

## 2020-05-15 DIAGNOSIS — R7401 Elevation of levels of liver transaminase levels: Secondary | ICD-10-CM | POA: Diagnosis not present

## 2020-05-15 DIAGNOSIS — M48061 Spinal stenosis, lumbar region without neurogenic claudication: Secondary | ICD-10-CM

## 2020-05-15 MED ORDER — HYDROCODONE-ACETAMINOPHEN 10-325 MG PO TABS: 1.0000 | ORAL_TABLET | Freq: Three times a day (TID) | ORAL | 0 refills | Status: DC | PRN

## 2020-05-15 MED ORDER — PREDNISONE 50 MG PO TABS: ORAL_TABLET | ORAL | 0 refills | Status: DC

## 2020-05-15 NOTE — Progress Notes (Signed)
    Procedures performed today:    None.  Independent interpretation of notes and tests performed by another provider:   None.  Brief History, Exam, Impression, and Recommendations:    Fall from slipping on ice Brandon Riley was walking his dog, slipped and fell impacting his right buttock, he has discrete pain across his lumbar spine and deep in the right buttock with discrete tenderness over the ischial tuberosity. No radicular symptoms, no bowel or bladder dysfunction, saddle numbness.  No progressive weakness We will x-ray lumbar spine, pelvis, as well as add 5 days of prednisone and high-dose hydrocodone. Follow-up with me in 2 to 4 weeks for this.  Obstructive sleep apnea on CPAP with periodic limb movement disorder Brandon Riley also has noted obstructive sleep apnea on CPAP with AutoPap, as well as.  Limb movement disorder. Historically he has had excellent usage of his CPAP device, with historical AHI down to 1.2 at 12 cm of H2O. His sleep apnea had worsened, and his device was on the maximum setting. We recently scanned his most recent snap diagnostic data. Due to refractory obstructive sleep apnea I would like him to touch base with sleep medicine, Dr. Tempie Hoist with [name].  Transaminitis Due to persistent elevation of LFTs I do think we should go ahead and pull the trigger for a liver ultrasound.    ___________________________________________ Ihor Austin. Benjamin Stain, M.D., ABFM., CAQSM. Primary Care and Sports Medicine [facility]  Adjunct Instructor of Family Medicine  [employer]

## 2020-05-15 NOTE — Assessment & Plan Note (Signed)
Brandon Riley also has noted obstructive sleep apnea on CPAP with AutoPap, as well as.  Limb movement disorder. Historically he has had excellent usage of his CPAP device, with historical AHI down to 1.2 at 12 cm of H2O. His sleep apnea had worsened, and his device was on the maximum setting. We recently scanned his most recent snap diagnostic data. Due to refractory obstructive sleep apnea I would like him to touch base with sleep medicine, Dr. Tempie Hoist with [name].

## 2020-05-15 NOTE — Assessment & Plan Note (Signed)
Brandon Riley was walking his dog, slipped and fell impacting his right buttock, he has discrete pain across his lumbar spine and deep in the right buttock with discrete tenderness over the ischial tuberosity. No radicular symptoms, no bowel or bladder dysfunction, saddle numbness.  No progressive weakness We will x-ray lumbar spine, pelvis, as well as add 5 days of prednisone and high-dose hydrocodone. Follow-up with me in 2 to 4 weeks for this.

## 2020-05-15 NOTE — Patient Instructions (Signed)
   Tempie Hoist, MD [facility] [address], [place], [place] [address] Call us [phone]

## 2020-05-16 DIAGNOSIS — K7581 Nonalcoholic steatohepatitis (NASH): Secondary | ICD-10-CM | POA: Insufficient documentation

## 2020-05-16 DIAGNOSIS — R7401 Elevation of levels of liver transaminase levels: Secondary | ICD-10-CM | POA: Insufficient documentation

## 2020-05-16 LAB — COMPREHENSIVE METABOLIC PANEL
AG Ratio: 1.6 (calc) (ref 1.0–2.5)
ALT: 75 U/L — ABNORMAL HIGH (ref 9–46)
AST: 50 U/L — ABNORMAL HIGH (ref 10–35)
Albumin: 4.6 g/dL (ref 3.6–5.1)
Alkaline phosphatase (APISO): 62 U/L (ref 35–144)
BUN: 15 mg/dL (ref 7–25)
CO2: 27 mmol/L (ref 20–32)
Calcium: 9.8 mg/dL (ref 8.6–10.3)
Chloride: 104 mmol/L (ref 98–110)
Creat: 1 mg/dL (ref 0.70–1.33)
Globulin: 2.8 g/dL (calc) (ref 1.9–3.7)
Glucose, Bld: 78 mg/dL (ref 65–99)
Potassium: 4.2 mmol/L (ref 3.5–5.3)
Sodium: 139 mmol/L (ref 135–146)
Total Bilirubin: 1.3 mg/dL — ABNORMAL HIGH (ref 0.2–1.2)
Total Protein: 7.4 g/dL (ref 6.1–8.1)

## 2020-05-16 NOTE — Assessment & Plan Note (Signed)
Due to persistent elevation of LFTs I do think we should go ahead and pull the trigger for a liver ultrasound.

## 2020-05-16 NOTE — Addendum Note (Signed)
Addended by: Monica Becton on: 05/16/2020 04:06 PM   Modules accepted: Orders

## 2020-05-25 ENCOUNTER — Other Ambulatory Visit: Payer: Self-pay

## 2020-05-25 ENCOUNTER — Ambulatory Visit (INDEPENDENT_AMBULATORY_CARE_PROVIDER_SITE_OTHER): Payer: 59

## 2020-05-25 DIAGNOSIS — N281 Cyst of kidney, acquired: Secondary | ICD-10-CM | POA: Diagnosis not present

## 2020-05-25 DIAGNOSIS — K760 Fatty (change of) liver, not elsewhere classified: Secondary | ICD-10-CM

## 2020-05-25 DIAGNOSIS — R7401 Elevation of levels of liver transaminase levels: Secondary | ICD-10-CM

## 2020-06-12 ENCOUNTER — Other Ambulatory Visit (HOSPITAL_BASED_OUTPATIENT_CLINIC_OR_DEPARTMENT_OTHER): Payer: Self-pay

## 2020-06-12 ENCOUNTER — Encounter: Payer: Self-pay | Admitting: Sports Medicine

## 2020-06-12 ENCOUNTER — Ambulatory Visit (INDEPENDENT_AMBULATORY_CARE_PROVIDER_SITE_OTHER): Payer: 59 | Admitting: Sports Medicine

## 2020-06-12 ENCOUNTER — Other Ambulatory Visit: Payer: Self-pay

## 2020-06-12 DIAGNOSIS — W009XXD Unspecified fall due to ice and snow, subsequent encounter: Secondary | ICD-10-CM

## 2020-06-12 DIAGNOSIS — G4733 Obstructive sleep apnea (adult) (pediatric): Secondary | ICD-10-CM

## 2020-06-12 DIAGNOSIS — K7581 Nonalcoholic steatohepatitis (NASH): Secondary | ICD-10-CM | POA: Diagnosis not present

## 2020-06-12 DIAGNOSIS — G4710 Hypersomnia, unspecified: Secondary | ICD-10-CM

## 2020-06-12 MED ORDER — ARMODAFINIL 50 MG PO TABS: 50.0000 mg | ORAL_TABLET | ORAL | 3 refills | Status: DC

## 2020-06-12 NOTE — Assessment & Plan Note (Signed)
Continuing to have symptoms on maximum CPAP setting. He did see the sleep doctor, another sleep study in the lab was ordered. He is having significant lapses in judgment during the day, drowsiness, I am going to add Nuvigil through the day until we can get his sleep study. It is scheduled for April 2.

## 2020-06-12 NOTE — Progress Notes (Signed)
    Procedures performed today:    None.  Independent interpretation of notes and tests performed by another provider:   None.  Brief History, Exam, Impression, and Recommendations:    Fall from slipping on ice Symptoms resolved, no change in plan.  Obstructive sleep apnea on CPAP with periodic limb movement disorder Continuing to have symptoms on maximum CPAP setting. He did see the sleep doctor, another sleep study in the lab was ordered. He is having significant lapses in judgment during the day, drowsiness, I am going to add Nuvigil through the day until we can get his sleep study. It is scheduled for April 2.  Steatohepatitis Minimally elevated LFTs, ultrasound does confirm steatohepatitis, he will work aggressively on weight loss, 40+ pounds are needed.    ___________________________________________ Ihor Austin. Benjamin Stain, M.D., ABFM., CAQSM. Primary Care and Sports Medicine [facility]  Adjunct Instructor of Family Medicine  [employer]

## 2020-06-12 NOTE — Assessment & Plan Note (Signed)
Minimally elevated LFTs, ultrasound does confirm steatohepatitis, he will work aggressively on weight loss, 40+ pounds are needed.

## 2020-06-12 NOTE — Patient Instructions (Signed)
Armodafinil tablets What is this medicine? ARMODAFINIL (ar moe DAF i nil) is used to treat excessive sleepiness caused by certain sleep disorders. This includes narcolepsy, sleep apnea, and shift work sleep disorder. This medicine may be used for other purposes; ask your health care provider or pharmacist if you have questions. COMMON BRAND NAME(S): Nuvigil What should I tell my health care provider before I take this medicine? They need to know if you have any of these conditions:  bipolar disorder  depression  drug or alcohol abuse or addiction  heart disease  high blood pressure  kidney disease  liver disease  schizophrenia  suicidal thoughts, plans, or attempt; a previous suicide attempt by you or a family member  an unusual or allergic reaction to armodafinil, modafinil, medicines, foods, dyes, or preservatives  pregnant or trying to get pregnant  breast-feeding How should I use this medicine? Take this medicine by mouth with a glass of water. Follow the directions on the prescription label. Take your doses at regular intervals. Do not take your medicine more often than directed. Do not stop taking this medicine suddenly except upon the advice of your doctor. Stopping this medicine too quickly may cause serious side effects or your condition may worsen. A special MedGuide will be given to you by the pharmacist with each prescription and refill. Be sure to read this information carefully each time. Talk to your pediatrician regarding the use of this medicine in children. While this drug may be prescribed for children as young as 17 years of age for selected conditions, precautions do apply. Overdosage: If you think you have taken too much of this medicine contact a poison control center or emergency room at once. NOTE: This medicine is only for you. Do not share this medicine with others. What if I miss a dose? If you miss a dose, take it as soon as you can. If it is almost  time for your next dose, take only that dose. Do not take double or extra doses. What may interact with this medicine? Do not take this medicine with any of the following medications:  amphetamine or dextroamphetamine  dexmethylphenidate or methylphenidate  MAOIs like Carbex, Eldepryl, Marplan, Nardil, and Parnate  pemoline  procarbazine This medicine may also interact with the following medications:  antifungal medicines like itraconazole or ketoconazole  barbiturates, like phenobarbital  birth control pills or other hormone-containing birth control devices or implants  carbamazepine  cyclosporine  diazepam  medicines for depression, anxiety, or psychotic disturbances  phenytoin  propranolol  triazolam  warfarin This list may not describe all possible interactions. Give your health care provider a list of all the medicines, herbs, non-prescription drugs, or dietary supplements you use. Also tell them if you smoke, drink alcohol, or use illegal drugs. Some items may interact with your medicine. What should I watch for while using this medicine? Visit your doctor or healthcare provider for regular checks on your progress. The full effect of this medicine may not be seen right away. This medicine may affect your concentration, function, or may hide signs that you are tired. You may get dizzy. This medicine will not eliminate your abnormal tendency to fall asleep and is not a replacement for sleep. Do not change your previous behavior regarding potentially dangerous activities, such as driving, using machinery, or doing anything that needs mental alertness until you know how this drug affects you. Alcohol can make you more dizzy and may interfere with your response to this medicine or   your alertness. Avoid alcoholic drinks. This medicine may cause serious skin reactions. They can happen weeks to months after starting the medicine. Contact your healthcare provider right away if  you notice fevers or flu-like symptoms with a rash. The rash may be red or purple and then turn into blisters or peeling of the skin. Or, you might notice a red rash with swelling of the face, lips, or lymph nodes in your neck or under your arms. Birth control pills may not work properly while you are taking this medicine. While using birth control pills, you will need an additional barrier method or an alternative non-hormonal method of birth control during treatment with armodafinil and for 1 month after stopping armodafinil. Talk to your doctor about which extra method of birth control is right for you. It is unknown if the effects of this medicine will be increased by the use of caffeine. Caffeine is found in many foods, beverages, and medications. Ask your doctor if you should limit or change your intake of caffeine-containing products while on this medicine. Do not stop previously prescribed treatments for your condition, such as a CPAP machine, except on the advise of your physician or healthcare provider. What side effects may I notice from receiving this medicine? Side effects that you should report to your doctor or health care professional as soon as possible:  allergic reactions like skin rash, itching or hives, swelling of the face, lips, or tongue  anxiety  breathing or swallowing problems  chest pain  depressed mood  elevated mood, decreased need for sleep, racing thoughts, impulsive behavior  fast, irregular heartbeat  hallucination, loss of contact with reality  increased blood pressure  mouth sores, blisters, or peeling skin  rash, fever, and swollen lymph nodes  redness, blistering, peeling, or loosening of the skin, including inside the mouth  sore throat, fever, or chills  suicidal thoughts or other mood changes  tremors  vomiting Side effects that usually do not require medical attention (report to your doctor or health care professional if they continue or  are bothersome):  headache  nausea, diarrhea, or stomach upset  nervousness  trouble sleeping This list may not describe all possible side effects. Call your doctor for medical advice about side effects. You may report side effects to FDA at 1-800-FDA-1088. Where should I keep my medicine? Keep out of the reach of children. Store at room temperature between 20 and 25 degrees C (68 and 77 degrees F). Throw away any unused medicine after the expiration date. NOTE: This sheet is a summary. It may not cover all possible information. If you have questions about this medicine, talk to your doctor, pharmacist, or health care provider.  2021 Elsevier/Gold Standard (2018-07-06 10:01:22)  

## 2020-06-12 NOTE — Assessment & Plan Note (Signed)
Symptoms resolved, no change in plan.

## 2020-07-28 ENCOUNTER — Other Ambulatory Visit: Payer: Self-pay

## 2020-07-28 ENCOUNTER — Ambulatory Visit (HOSPITAL_BASED_OUTPATIENT_CLINIC_OR_DEPARTMENT_OTHER): Payer: 59 | Attending: Internal Medicine | Admitting: Internal Medicine

## 2020-07-28 DIAGNOSIS — G4710 Hypersomnia, unspecified: Secondary | ICD-10-CM

## 2020-07-28 DIAGNOSIS — G4761 Periodic limb movement disorder: Secondary | ICD-10-CM | POA: Diagnosis not present

## 2020-07-28 DIAGNOSIS — G4733 Obstructive sleep apnea (adult) (pediatric): Secondary | ICD-10-CM | POA: Insufficient documentation

## 2020-07-28 DIAGNOSIS — G4730 Sleep apnea, unspecified: Secondary | ICD-10-CM

## 2020-08-03 ENCOUNTER — Telehealth: Payer: Self-pay

## 2020-08-03 DIAGNOSIS — G4733 Obstructive sleep apnea (adult) (pediatric): Secondary | ICD-10-CM

## 2020-08-03 NOTE — Telephone Encounter (Signed)
Patient needs to be setup with BiPAP through [facility] who currently takes care of his CPAP.  He states that he is taking 3 Requip nightly and still flailing in his sleep. He wants to know if the BiPAP will help this or do we need to come up with a different plan for this? Please advise.

## 2020-08-03 NOTE — Telephone Encounter (Signed)
Looks like he maxed out his CPAP machine and was switched to BiPAP, the BiPAP pressures that were used to seemed to control symptoms well.

## 2020-08-03 NOTE — Telephone Encounter (Signed)
Call from wife, Aurther Loft, who stated that patient had his sleep study done on 07/28/20 and is "anxiously waiting for results". Please advise.

## 2020-08-05 NOTE — Telephone Encounter (Signed)
[facility] referral placed for BiPAP, lets hold off on other changes until we have had maybe a week of BiPAP.  He and I probably need to have at least a virtual visit to discuss periodic limb movement disorder and insomnia on BiPAP.

## 2020-08-06 ENCOUNTER — Other Ambulatory Visit: Payer: Self-pay | Admitting: Sports Medicine

## 2020-08-06 DIAGNOSIS — G4733 Obstructive sleep apnea (adult) (pediatric): Secondary | ICD-10-CM

## 2020-08-06 NOTE — Procedures (Signed)
NAME: Brandon Riley DATE OF BIRTH:  08/23/1960 MEDICAL RECORD NUMBER [id number]  LOCATION: [facility]  PHYSICIAN: Deretha Emory  DATE OF STUDY: 07/28/2020  SLEEP STUDY TYPE: Positive Airway Pressure Titration               REFERRING PHYSICIAN: Tempie Hoist, MD  EPWORTH SLEEPINESS SCORE:  16 HEIGHT: 5\' 7"  (170.2 cm)  WEIGHT: 228 lb (103.4 kg)    Body mass index is 35.71 kg/m.  NECK SIZE: 19 in.  CLINICAL INFORMATION The patient was referred to the sleep center for PAP titration. He has OSA and has been using CPAP successfully with good results on downloads (AHI 0.4/hr on last download). However, he continues to be symptomatic with excessive daytime sleepiness during the day. He is on trazodone and ropinirole which could be contributing to sleepiness.   MEDICATIONS No sleep medicine administered. Of note, he did not take ropinirole on the night of the test.   SLEEP STUDY TECHNIQUE The patient underwent an attended overnight polysomnography titration to assess the effects of cpap therapy. The following variables were monitored: EEG (C4-A1, C3-A2, O1-A2, O2-A1, F3-M2, F4-M1), EOG, submental and leg EMG, ECG, oxyhemoglobin saturation by pulse oximetry, thoracic and abdominal respiratory effort belts, nasal/oral airflow by pressure sensor, body position sensor and snoring sensor. CPAP pressure was titrated to eliminate apneas, hypopneas and oxygen desaturation.  TECHNICAL COMMENTS Comments added by Technician: Patient was restless all through the night. The patient did not take his usual medication for restless leg syndrome but reports he doesn't know how well it works. Comments added by Scorer: N/A  SLEEP ARCHITECTURE The study was initiated at 10:53:29 PM and terminated at 5:20:42 AM. Total recorded time was 387.2 minutes. EEG confirmed total sleep time was 346.6 minutes yielding a sleep efficiency of 89.5%. Sleep onset after lights out was 18.6 minutes with a REM  latency of 86.5 minutes. The patient spent 14.71% of the night in stage N1 sleep, 70.43% in stage N2 sleep, 0.00% in stage N3 and 14.9% in REM. The Arousal Index was 48.8/hour.  RESPIRATORY PARAMETERS The overall AHI was 1.4 per hour, and the RDI was 1.9 events/hour with a central apnea index of 0.5per hour. The patient was started on APAP 4-20. His AHI was zero but his RERAs were noted, so the patient was changed to BPAP. The most appropriate setting of BPAP was IPAP/EPAP 22/16 cm H2O. At this setting, the sleep efficiency was 96 % and the patient was supine for 100%. The AHI was 2 events per hour, and the RDI was 2 events/hour (with 0.5 central events) and the arousal index was 6.4 per hour.The oxygen nadir was 93.0% during sleep at optimal pressure.  LEG MOVEMENT DATA The total leg movements were with a resulting leg movement index of 101/hr. Associated arousal with leg movement index was 38.1/hr  CARDIAC DATA The underlying cardiac rhythm was most consistent with sinus rhythm. Mean heart rate during sleep was 60.79 bpm. Additional rhythm abnormalities include None.  IMPRESSIONS - Severe Obstructive Sleep Apnea (OSA) by history. Optimal pressure attained. - No Significant Central Sleep Apnea (CSA) - Severe leg movements during sleep. Associated arousals were significant with an arousal index of 38.1 /hour.  DIAGNOSIS - Obstructive Sleep Apnea (G47.33) - Periodic Limb Movement Disorder  RECOMMENDATIONS - Consider trial of BiPAP therapy on 22/16 cm H2O with a Medium Cushion size [employer]ips Respironics Minimal Contact Dreamwear Under Nose Frame (M) mask and heated humidification. - It is not  clear if treatment with ropinirole on the night of the test would have resulted in less arousals from leg movements. Both arousals from leg movements and ropinirole could increase daytime sleepiness.   Deretha Emory Sleep specialist, American Board of Internal Medicine  ELECTRONICALLY SIGNED ON:   08/06/2020, 9:09 AM [facility] PH: [phone]   FX: [phone] ACCREDITED BY THE AMERICAN ACADEMY OF SLEEP MEDICINE

## 2020-08-07 ENCOUNTER — Other Ambulatory Visit: Payer: Self-pay

## 2020-08-07 ENCOUNTER — Ambulatory Visit: Payer: 59 | Admitting: Sports Medicine

## 2020-08-07 ENCOUNTER — Encounter: Payer: Self-pay | Admitting: Sports Medicine

## 2020-08-07 DIAGNOSIS — J0100 Acute maxillary sinusitis, unspecified: Secondary | ICD-10-CM | POA: Diagnosis not present

## 2020-08-07 MED ORDER — CETIRIZINE-PSEUDOEPHEDRINE ER 5-120 MG PO TB12: 1.0000 | ORAL_TABLET | Freq: Two times a day (BID) | ORAL | Status: DC

## 2020-08-07 MED ORDER — PREDNISONE 50 MG PO TABS: 50.0000 mg | ORAL_TABLET | Freq: Every day | ORAL | 0 refills | Status: DC

## 2020-08-07 MED ORDER — AMOXICILLIN-POT CLAVULANATE 875-125 MG PO TABS: 1.0000 | ORAL_TABLET | Freq: Two times a day (BID) | ORAL | 0 refills | Status: AC

## 2020-08-07 NOTE — Progress Notes (Signed)
    Procedures performed today:    None.  Independent interpretation of notes and tests performed by another provider:   None.  Brief History, Exam, Impression, and Recommendations:    Acute non-recurrent maxillary sinusitis This is a pleasant 60 year old male, he has had several days of increasing facial pain and pressure with radiation to the ears. On exam he has tenderness over the maxillary sinuses, erythematous left tympanic membrane, his nasal turbinates are swollen, boggy. Adding 5 days of prednisone, continue Zyrtec-D, adding Augmentin for 10 days. Return to see me if no better in a week or 2.    ___________________________________________ Ihor Austin. Benjamin Stain, M.D., ABFM., CAQSM. Primary Care and Sports Medicine [facility]  Adjunct Instructor of Family Medicine  [employer]

## 2020-08-07 NOTE — Assessment & Plan Note (Signed)
This is a pleasant 60 year old male, he has had several days of increasing facial pain and pressure with radiation to the ears. On exam he has tenderness over the maxillary sinuses, erythematous left tympanic membrane, his nasal turbinates are swollen, boggy. Adding 5 days of prednisone, continue Zyrtec-D, adding Augmentin for 10 days. Return to see me if no better in a week or 2.

## 2020-08-07 NOTE — Patient Instructions (Signed)
Sinusitis, Adult Sinusitis is inflammation of your sinuses. Sinuses are hollow spaces in the bones around your face. Your sinuses are located:  Around your eyes.  In the middle of your forehead.  Behind your nose.  In your cheekbones. Mucus normally drains out of your sinuses. When your nasal tissues become inflamed or swollen, mucus can become trapped or blocked. This allows bacteria, viruses, and fungi to grow, which leads to infection. Most infections of the sinuses are caused by a virus. Sinusitis can develop quickly. It can last for up to 4 weeks (acute) or for more than 12 weeks (chronic). Sinusitis often develops after a cold. What are the causes? This condition is caused by anything that creates swelling in the sinuses or stops mucus from draining. This includes:  Allergies.  Asthma.  Infection from bacteria or viruses.  Deformities or blockages in your nose or sinuses.  Abnormal growths in the nose (nasal polyps).  Pollutants, such as chemicals or irritants in the air.  Infection from fungi (rare). What increases the risk? You are more likely to develop this condition if you:  Have a weak body defense system (immune system).  Do a lot of swimming or diving.  Overuse nasal sprays.  Smoke. What are the signs or symptoms? The main symptoms of this condition are pain and a feeling of pressure around the affected sinuses. Other symptoms include:  Stuffy nose or congestion.  Thick drainage from your nose.  Swelling and warmth over the affected sinuses.  Headache.  Upper toothache.  A cough that may get worse at night.  Extra mucus that collects in the throat or the back of the nose (postnasal drip).  Decreased sense of smell and taste.  Fatigue.  A fever.  Sore throat.  Bad breath. How is this diagnosed? This condition is diagnosed based on:  Your symptoms.  Your medical history.  A physical exam.  Tests to find out if your condition is  acute or chronic. This may include: ? Checking your nose for nasal polyps. ? Viewing your sinuses using a device that has a light (endoscope). ? Testing for allergies or bacteria. ? Imaging tests, such as an MRI or CT scan. In rare cases, a bone biopsy may be done to rule out more serious types of fungal sinus disease. How is this treated? Treatment for sinusitis depends on the cause and whether your condition is chronic or acute.  If caused by a virus, your symptoms should go away on their own within 10 days. You may be given medicines to relieve symptoms. They include: ? Medicines that shrink swollen nasal passages (topical intranasal decongestants). ? Medicines that treat allergies (antihistamines). ? A spray that eases inflammation of the nostrils (topical intranasal corticosteroids). ? Rinses that help get rid of thick mucus in your nose (nasal saline washes).  If caused by bacteria, your health care provider may recommend waiting to see if your symptoms improve. Most bacterial infections will get better without antibiotic medicine. You may be given antibiotics if you have: ? A severe infection. ? A weak immune system.  If caused by narrow nasal passages or nasal polyps, you may need to have surgery. Follow these instructions at home: Medicines  Take, use, or apply over-the-counter and prescription medicines only as told by your health care provider. These may include nasal sprays.  If you were prescribed an antibiotic medicine, take it as told by your health care provider. Do not stop taking the antibiotic even if you start   to feel better. Hydrate and humidify  Drink enough fluid to keep your urine pale yellow. Staying hydrated will help to thin your mucus.  Use a cool mist humidifier to keep the humidity level in your home above 50%.  Inhale steam for 10-15 minutes, 3-4 times a day, or as told by your health care provider. You can do this in the bathroom while a hot shower is  running.  Limit your exposure to cool or dry air.   Rest  Rest as much as possible.  Sleep with your head raised (elevated).  Make sure you get enough sleep each night. General instructions  Apply a warm, moist washcloth to your face 3-4 times a day or as told by your health care provider. This will help with discomfort.  Wash your hands often with soap and water to reduce your exposure to germs. If soap and water are not available, use hand sanitizer.  Do not smoke. Avoid being around people who are smoking (secondhand smoke).  Keep all follow-up visits as told by your health care provider. This is important.   Contact a health care provider if:  You have a fever.  Your symptoms get worse.  Your symptoms do not improve within 10 days. Get help right away if:  You have a severe headache.  You have persistent vomiting.  You have severe pain or swelling around your face or eyes.  You have vision problems.  You develop confusion.  Your neck is stiff.  You have trouble breathing. Summary  Sinusitis is soreness and inflammation of your sinuses. Sinuses are hollow spaces in the bones around your face.  This condition is caused by nasal tissues that become inflamed or swollen. The swelling traps or blocks the flow of mucus. This allows bacteria, viruses, and fungi to grow, which leads to infection.  If you were prescribed an antibiotic medicine, take it as told by your health care provider. Do not stop taking the antibiotic even if you start to feel better.  Keep all follow-up visits as told by your health care provider. This is important. This information is not intended to replace advice given to you by your health care provider. Make sure you discuss any questions you have with your health care provider. Document Revised: 09/14/2017 Document Reviewed: 09/14/2017 Elsevier Patient Education  2021 Elsevier Inc.  

## 2020-08-31 ENCOUNTER — Encounter: Payer: Self-pay | Admitting: Sports Medicine

## 2020-08-31 ENCOUNTER — Ambulatory Visit (INDEPENDENT_AMBULATORY_CARE_PROVIDER_SITE_OTHER): Payer: 59

## 2020-08-31 ENCOUNTER — Other Ambulatory Visit: Payer: Self-pay

## 2020-08-31 ENCOUNTER — Ambulatory Visit: Payer: 59 | Admitting: Sports Medicine

## 2020-08-31 DIAGNOSIS — G4733 Obstructive sleep apnea (adult) (pediatric): Secondary | ICD-10-CM | POA: Diagnosis not present

## 2020-08-31 DIAGNOSIS — M5030 Other cervical disc degeneration, unspecified cervical region: Secondary | ICD-10-CM | POA: Diagnosis not present

## 2020-08-31 DIAGNOSIS — R972 Elevated prostate specific antigen [PSA]: Secondary | ICD-10-CM

## 2020-08-31 DIAGNOSIS — M2550 Pain in unspecified joint: Secondary | ICD-10-CM | POA: Diagnosis not present

## 2020-08-31 DIAGNOSIS — J0100 Acute maxillary sinusitis, unspecified: Secondary | ICD-10-CM

## 2020-08-31 DIAGNOSIS — E7800 Pure hypercholesterolemia, unspecified: Secondary | ICD-10-CM

## 2020-08-31 DIAGNOSIS — E291 Testicular hypofunction: Secondary | ICD-10-CM

## 2020-08-31 MED ORDER — IPRATROPIUM BROMIDE 0.06 % NA SOLN: 2.0000 | Freq: Four times a day (QID) | NASAL | 11 refills | Status: DC

## 2020-08-31 MED ORDER — ROPINIROLE HCL 2 MG PO TABS: 2.0000 mg | ORAL_TABLET | Freq: Every day | ORAL | 11 refills | Status: DC

## 2020-08-31 MED ORDER — GABAPENTIN 300 MG PO CAPS: ORAL_CAPSULE | ORAL | 3 refills | Status: DC

## 2020-08-31 NOTE — Assessment & Plan Note (Signed)
Brandon Riley is starting to have increasing paresthesias down his left arm particularly at night. He did have a cervical spine MRI back in 2014 that showed cervical DDD. We will start conservatively with some home rehab exercises, x-rays, as well as gabapentin in an up taper starting nightly. Return to see me in 4 weeks for this, we can continue to up taper gabapentin versus get an MRI if no better.

## 2020-08-31 NOTE — Assessment & Plan Note (Signed)
Brandon Riley returns, we treated his acute maxillary sinusitis about 3 weeks ago. He was having increasing facial pain and pressure with radiation to the ears, we treated him with Augmentin, he did develop some diarrhea which has resolved, unfortunately he still feels some pressure in his right ear. External ear exam is completely unremarkable. We will go ahead and add nasal Atrovent in the hopes of opening up his eustachian tubes but if no improvement in 4 weeks or so we will refer to ENT.

## 2020-08-31 NOTE — Assessment & Plan Note (Signed)
Brandon Riley has noted improvements with switching to BiPAP, this was noted on a recent CPAP titration, where he had maxed out on his CPAP settings necessitating BiPAP at 22/16. He has stopped his armodafinil as his daytime sleepiness and inattention has improved. He also had periodic limb movement disorder, currently on 0.75 mg of Requip, increasing dose. We can follow this up in a month with an up taper if needed. We will probably max him out at around 4 to 8 mg nightly if needed.

## 2020-08-31 NOTE — Assessment & Plan Note (Signed)
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 Adding testosterone levels, he recalls in the past we did supplement testosterone which improved sex drive tremendously. He does understand that even if we increase his testosterone levels, if there is any erectile dysfunction this will likely not be improved with testosterone supplementation.

## 2020-08-31 NOTE — Progress Notes (Addendum)
    Procedures performed today:    None.  Independent interpretation of notes and tests performed by another provider:   None.  Brief History, Exam, Impression, and Recommendations:    Acute non-recurrent maxillary sinusitis Tobenna returns, we treated his acute maxillary sinusitis about 3 weeks ago. He was having increasing facial pain and pressure with radiation to the ears, we treated him with Augmentin, he did develop some diarrhea which has resolved, unfortunately he still feels some pressure in his right ear. External ear exam is completely unremarkable. We will go ahead and add nasal Atrovent in the hopes of opening up his eustachian tubes but if no improvement in 4 weeks or so we will refer to ENT.  Obstructive sleep apnea on BiPAP with periodic limb movement disorder Tramane has noted improvements with switching to BiPAP, this was noted on a recent CPAP titration, where he had maxed out on his CPAP settings necessitating BiPAP at 22/16. He has stopped his armodafinil as his daytime sleepiness and inattention has improved. He also had periodic limb movement disorder, currently on 0.75 mg of Requip, increasing dose. We can follow this up in a month with an up taper if needed. We will probably max him out at around 4 to 8 mg nightly if needed.  Degenerative disc disease, cervical Ilijah is starting to have increasing paresthesias down his left arm particularly at night. He did have a cervical spine MRI back in 2014 that showed cervical DDD. We will start conservatively with some home rehab exercises, x-rays, as well as gabapentin in an up taper starting nightly. Return to see me in 4 weeks for this, we can continue to up taper gabapentin versus get an MRI if no better.  Polyarthralgia Widespread aches and pains, adding rheumatologic testing. If continues and lab testing unrevealing we will consider backing off of his Crestor.  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 Adding testosterone levels, he recalls in the past we did supplement testosterone which improved sex drive tremendously. He does understand that even if we increase his testosterone levels, if there is any erectile dysfunction this will likely not be improved with testosterone supplementation.  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 In the future we may need to consider pravastatin or Livalo.  Elevated PSA PSA is also elevated to 6, oftentimes this represents a prostatitis so we will do 2 weeks of ciprofloxacin and then recheck PSA in a month.    ___________________________________________ Ihor Austin. Benjamin Stain, M.D., ABFM., CAQSM. Primary Care and Sports Medicine [facility]  Adjunct Instructor of Family Medicine  [employer]

## 2020-08-31 NOTE — Assessment & Plan Note (Addendum)
Widespread aches and pains, adding rheumatologic testing. If continues and lab testing unrevealing we will consider backing off of his Crestor.  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

## 2020-09-07 ENCOUNTER — Encounter: Payer: Self-pay | Admitting: Sports Medicine

## 2020-09-07 DIAGNOSIS — R972 Elevated prostate specific antigen [PSA]: Secondary | ICD-10-CM | POA: Insufficient documentation

## 2020-09-07 MED ORDER — CIPROFLOXACIN HCL 750 MG PO TABS: 750.0000 mg | ORAL_TABLET | Freq: Two times a day (BID) | ORAL | 0 refills | Status: AC

## 2020-09-07 NOTE — Addendum Note (Signed)
Addended by: Monica Becton on: 09/07/2020 09:03 AM   Modules accepted: Orders

## 2020-09-07 NOTE — Addendum Note (Signed)
Addended by: Monica Becton on: 09/07/2020 11:27 AM   Modules accepted: Orders

## 2020-09-07 NOTE — Assessment & Plan Note (Signed)
PSA is also elevated to 6, oftentimes this represents a prostatitis so we will do 2 weeks of ciprofloxacin and then recheck PSA in a month.

## 2020-09-07 NOTE — Assessment & Plan Note (Signed)
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 In the future we may need to consider pravastatin or Livalo.

## 2020-09-12 ENCOUNTER — Other Ambulatory Visit: Payer: Self-pay

## 2020-09-12 ENCOUNTER — Ambulatory Visit: Payer: 59 | Admitting: Sports Medicine

## 2020-09-12 ENCOUNTER — Ambulatory Visit (INDEPENDENT_AMBULATORY_CARE_PROVIDER_SITE_OTHER): Payer: 59

## 2020-09-12 DIAGNOSIS — Z09 Encounter for follow-up examination after completed treatment for conditions other than malignant neoplasm: Secondary | ICD-10-CM | POA: Diagnosis not present

## 2020-09-12 DIAGNOSIS — M1711 Unilateral primary osteoarthritis, right knee: Secondary | ICD-10-CM

## 2020-09-12 NOTE — Progress Notes (Signed)
    Procedures performed today:    Procedure: Real-time Ultrasound Guided injection of the right knee Device: Samsung HS60  Verbal informed consent obtained.  Time-out conducted.  Noted no overlying erythema, induration, or other signs of local infection.  Skin prepped in a sterile fashion.  Local anesthesia: Topical Ethyl chloride.  With sterile technique and under real time ultrasound guidance:  No effusion noted, 1 cc Kenalog 40, 2 cc lidocaine, 2 cc bupivacaine injected easily Completed without difficulty  Advised to call if fevers/chills, erythema, induration, drainage, or persistent bleeding.  Images permanently stored and available for review in PACS.  Impression: Technically successful ultrasound guided injection.  Independent interpretation of notes and tests performed by another provider:   None.  Brief History, Exam, Impression, and Recommendations:    Primary osteoarthritis of right knee Increasing pain in the right knee, classic gelling. Get some updated x-rays today, Aleve and other NSAIDs were not effective, we injected the knee today, return to see me in 6 weeks. Exacerbation of chronic process.    ___________________________________________ Ihor Austin. Benjamin Stain, M.D., ABFM., CAQSM. Primary Care and Sports Medicine [facility]  Adjunct Instructor of Family Medicine  [employer]

## 2020-09-12 NOTE — Assessment & Plan Note (Addendum)
Increasing pain in the right knee, classic gelling. Get some updated x-rays today, Aleve and other NSAIDs were not effective, we injected the knee today, return to see me in 6 weeks. Exacerbation of chronic process.

## 2020-09-14 LAB — LUPUS(12) PANEL
Anti Nuclear Antibody (ANA): NEGATIVE
C3 Complement: 126 mg/dL (ref 82–185)
C4 Complement: 14 mg/dL — ABNORMAL LOW (ref 15–53)
ENA SM Ab Ser-aCnc: <1 AI
Rheumatoid fact SerPl-aCnc: <14 IU/mL (ref <14)
Ribosomal P Protein Ab: <1 AI
SM/RNP: <1 AI
SSA (Ro) (ENA) Antibody, IgG: <1 AI
SSB (La) (ENA) Antibody, IgG: <1 AI
Scleroderma (Scl-70) (ENA) Antibody, IgG: <1 AI
Thyroperoxidase Ab SerPl-aCnc: 38 IU/mL — ABNORMAL HIGH (ref <9)
ds DNA Ab: <1 IU/mL

## 2020-09-14 LAB — CBC WITH DIFFERENTIAL/PLATELET
Absolute Monocytes: 388 cells/uL (ref 200–950)
Basophils Absolute: 51 cells/uL (ref 0–200)
Basophils Relative: 1.5 %
Eosinophils Absolute: 269 cells/uL (ref 15–500)
Eosinophils Relative: 7.9 %
HCT: 45.6 % (ref 38.5–50.0)
Hemoglobin: 15.5 g/dL (ref 13.2–17.1)
Lymphs Abs: 1401 cells/uL (ref 850–3900)
MCH: 30.7 pg (ref 27.0–33.0)
MCHC: 34 g/dL (ref 32.0–36.0)
MCV: 90.3 fL (ref 80.0–100.0)
MPV: 10 fL (ref 7.5–12.5)
Monocytes Relative: 11.4 %
Neutro Abs: 1292 cells/uL — ABNORMAL LOW (ref 1500–7800)
Neutrophils Relative %: 38 %
Platelets: 230 10*3/uL (ref 140–400)
RBC: 5.05 10*6/uL (ref 4.20–5.80)
RDW: 12.6 % (ref 11.0–15.0)
Total Lymphocyte: 41.2 %
WBC: 3.4 10*3/uL — ABNORMAL LOW (ref 3.8–10.8)

## 2020-09-14 LAB — RHEUMATOID FACTOR (IGA, IGG, IGM)
Rheumatoid Factor (IgA): <5 U (ref <=6)
Rheumatoid Factor (IgG): 6 U (ref <=6)
Rheumatoid Factor (IgM): 5 U (ref <=6)

## 2020-09-14 LAB — COMPREHENSIVE METABOLIC PANEL
AG Ratio: 1.9 (calc) (ref 1.0–2.5)
ALT: 32 U/L (ref 9–46)
AST: 30 U/L (ref 10–35)
Albumin: 4.7 g/dL (ref 3.6–5.1)
Alkaline phosphatase (APISO): 64 U/L (ref 35–144)
BUN: 18 mg/dL (ref 7–25)
CO2: 28 mmol/L (ref 20–32)
Calcium: 9.8 mg/dL (ref 8.6–10.3)
Chloride: 102 mmol/L (ref 98–110)
Creat: 0.86 mg/dL (ref 0.70–1.33)
Globulin: 2.5 g/dL (calc) (ref 1.9–3.7)
Glucose, Bld: 102 mg/dL — ABNORMAL HIGH (ref 65–99)
Potassium: 4.4 mmol/L (ref 3.5–5.3)
Sodium: 138 mmol/L (ref 135–146)
Total Bilirubin: 1.7 mg/dL — ABNORMAL HIGH (ref 0.2–1.2)
Total Protein: 7.2 g/dL (ref 6.1–8.1)

## 2020-09-14 LAB — PSA, TOTAL AND FREE
PSA, % Free: 23 % (calc) — ABNORMAL LOW (ref >25)
PSA, Free: 1.4 ng/mL
PSA, Total: 6 ng/mL — ABNORMAL HIGH (ref <=4.0)

## 2020-09-14 LAB — CK: Total CK: 307 U/L — ABNORMAL HIGH (ref 44–196)

## 2020-09-14 LAB — URIC ACID: Uric Acid, Serum: 5.7 mg/dL (ref 4.0–8.0)

## 2020-09-14 LAB — TESTOSTERONE, FREE & TOTAL
Free Testosterone: 74.1 pg/mL (ref 35.0–155.0)
Testosterone, Total, LC-MS-MS: 576 ng/dL (ref 250–1100)

## 2020-09-14 LAB — SEDIMENTATION RATE: Sed Rate: 6 mm/h (ref 0–20)

## 2020-09-14 LAB — CYCLIC CITRUL PEPTIDE ANTIBODY, IGG: Cyclic Citrullin Peptide Ab: <16 UNITS

## 2020-09-27 ENCOUNTER — Ambulatory Visit: Payer: 59 | Admitting: Sports Medicine

## 2020-09-28 ENCOUNTER — Ambulatory Visit: Payer: 59 | Admitting: Sports Medicine

## 2020-10-05 ENCOUNTER — Other Ambulatory Visit: Payer: Self-pay | Admitting: Sports Medicine

## 2020-10-19 ENCOUNTER — Ambulatory Visit (INDEPENDENT_AMBULATORY_CARE_PROVIDER_SITE_OTHER): Payer: 59 | Admitting: Sports Medicine

## 2020-10-19 ENCOUNTER — Other Ambulatory Visit: Payer: Self-pay

## 2020-10-19 DIAGNOSIS — M2550 Pain in unspecified joint: Secondary | ICD-10-CM

## 2020-10-19 DIAGNOSIS — E7800 Pure hypercholesterolemia, unspecified: Secondary | ICD-10-CM | POA: Diagnosis not present

## 2020-10-19 DIAGNOSIS — R972 Elevated prostate specific antigen [PSA]: Secondary | ICD-10-CM

## 2020-10-19 DIAGNOSIS — G4733 Obstructive sleep apnea (adult) (pediatric): Secondary | ICD-10-CM | POA: Diagnosis not present

## 2020-10-19 DIAGNOSIS — M1711 Unilateral primary osteoarthritis, right knee: Secondary | ICD-10-CM | POA: Diagnosis not present

## 2020-10-19 MED ORDER — NAPROXEN 500 MG PO TABS: 500.0000 mg | ORAL_TABLET | Freq: Two times a day (BID) | ORAL | 6 refills | Status: DC

## 2020-10-19 MED ORDER — ROSUVASTATIN CALCIUM 10 MG PO TABS: 10.0000 mg | ORAL_TABLET | Freq: Every day | ORAL | 2 refills | Status: DC

## 2020-10-19 NOTE — Assessment & Plan Note (Addendum)
Treatment for this particular occurrence began 09/12/2020, we injected his knee at that date as over-the-counter analgesics and NSAIDs were ineffective.   We also obtained x-rays on that date that showed mild degenerative changes. He was also given physician directed conditioning exercises on that date. On his exam today he has tenderness at the joint line, positive McMurray's sign, pain with terminal flexion.  Mild effusion. He had partial improvement after the injection with persistence of pain, switching to prescription strength naproxen. We will go and pull the trigger for an MRI for surgical planning. If no visible meniscal tearing we will switch to viscosupplementation, if he has extensive meniscal tearing he will need arthroscopy.  Update: We had to do a peer to peer, the peer to peer had a recommended approval, but timed out.   I started another request for MRI, case #[id number], fax [phone] attention clinical review. I will fax off the clinicals.  We will await review.

## 2020-10-19 NOTE — Assessment & Plan Note (Addendum)
PSA elevated to 6, suspected prostatitis, we did a couple week course of ciprofloxacin, rechecking PSA.  PSA continues to increase albeit slowly in spite of a course of ciprofloxacin, I would like urology to weigh in.

## 2020-10-19 NOTE — Assessment & Plan Note (Signed)
Myalgias have not improved with the discontinuation of Crestor, we will restart this medication, I would like to recheck his CK levels.

## 2020-10-19 NOTE — Progress Notes (Addendum)
    Procedures performed today:    None.  Independent interpretation of notes and tests performed by another provider:   None.  Brief History, Exam, Impression, and Recommendations:    Elevated PSA PSA elevated to 6, suspected prostatitis, we did a couple week course of ciprofloxacin, rechecking PSA.  PSA continues to increase albeit slowly in spite of a course of ciprofloxacin, I would like urology to weigh in.  Hyperlipidemia Myalgias have not improved with the discontinuation of Crestor, we will restart this medication, I would like to recheck his CK levels.   Obstructive sleep apnea on BiPAP with periodic limb movement disorder Pressley continues to note great improvements with the switch to BiPAP, he had maxed out his CPAP settings so we switched to BiPAP at 22/16. He had stopped his armodafinil as his daytime sleepiness and inattention improved. He also had periodic limb movement disorder, well controlled on Requip 1 mg nightly. Maximum dose is 48 mg nightly if needed. He does have significant snoring, we may need to switch to a full facemask BiPAP, he will keep an eye on this.   Right knee degenerative meniscal tear Treatment for this particular occurrence began 09/12/2020, we injected his knee at that date as over-the-counter analgesics and NSAIDs were ineffective.   We also obtained x-rays on that date that showed mild degenerative changes. He was also given physician directed conditioning exercises on that date. On his exam today he has tenderness at the joint line, positive McMurray's sign, pain with terminal flexion.  Mild effusion. He had partial improvement after the injection with persistence of pain, switching to prescription strength naproxen. We will go and pull the trigger for an MRI for surgical planning. If no visible meniscal tearing we will switch to viscosupplementation, if he has extensive meniscal tearing he will need arthroscopy.  Update: We had to do a  peer to peer, the peer to peer had a recommended approval, but timed out.   I started another request for MRI, case #[id number], fax [phone] attention clinical review. I will fax off the clinicals.  We will await review.    ___________________________________________ Ihor Austin. Benjamin Stain, M.D., ABFM., CAQSM. Primary Care and Sports Medicine [facility]  Adjunct Instructor of Family Medicine  [employer]

## 2020-10-19 NOTE — Assessment & Plan Note (Signed)
Drevion continues to note great improvements with the switch to BiPAP, he had maxed out his CPAP settings so we switched to BiPAP at 22/16. He had stopped his armodafinil as his daytime sleepiness and inattention improved. He also had periodic limb movement disorder, well controlled on Requip 1 mg nightly. Maximum dose is 48 mg nightly if needed. He does have significant snoring, we may need to switch to a full facemask BiPAP, he will keep an eye on this.

## 2020-10-22 LAB — PSA, TOTAL AND FREE
PSA, % Free: 28 % (calc) (ref >25)
PSA, Free: 1.8 ng/mL
PSA, Total: 6.5 ng/mL — ABNORMAL HIGH (ref <=4.0)

## 2020-10-22 LAB — CK: Total CK: 182 U/L (ref 44–196)

## 2020-10-22 NOTE — Addendum Note (Signed)
Addended by: Monica Becton on: 10/22/2020 01:04 PM   Modules accepted: Orders

## 2020-11-16 ENCOUNTER — Encounter: Payer: Self-pay | Admitting: Sports Medicine

## 2020-11-19 DIAGNOSIS — S83231D Complex tear of medial meniscus, current injury, right knee, subsequent encounter: Secondary | ICD-10-CM

## 2020-11-20 MED ORDER — HYDROCODONE-ACETAMINOPHEN 5-325 MG PO TABS: 1.0000 | ORAL_TABLET | Freq: Three times a day (TID) | ORAL | 0 refills | Status: DC | PRN

## 2021-01-05 ENCOUNTER — Ambulatory Visit (INDEPENDENT_AMBULATORY_CARE_PROVIDER_SITE_OTHER): Payer: 59

## 2021-01-05 ENCOUNTER — Other Ambulatory Visit: Payer: Self-pay

## 2021-01-05 DIAGNOSIS — M1711 Unilateral primary osteoarthritis, right knee: Secondary | ICD-10-CM | POA: Diagnosis not present

## 2021-01-07 MED ORDER — OMEPRAZOLE 40 MG PO CPDR: 40.0000 mg | DELAYED_RELEASE_CAPSULE | Freq: Every day | ORAL | 3 refills | Status: DC

## 2021-01-09 DIAGNOSIS — S83231D Complex tear of medial meniscus, current injury, right knee, subsequent encounter: Secondary | ICD-10-CM

## 2021-01-14 ENCOUNTER — Ambulatory Visit: Payer: 59

## 2021-01-14 ENCOUNTER — Ambulatory Visit (INDEPENDENT_AMBULATORY_CARE_PROVIDER_SITE_OTHER): Payer: 59

## 2021-01-14 ENCOUNTER — Other Ambulatory Visit: Payer: Self-pay

## 2021-01-14 VITALS — BP 124/79 | HR 85 | Temp 98.3°F

## 2021-01-14 DIAGNOSIS — Z23 Encounter for immunization: Secondary | ICD-10-CM

## 2021-01-14 NOTE — Progress Notes (Signed)
Patient presents today for influenza vaccination.    HA: No Dizziness/lightheadedness: No BA: No Weakness/Fatigue: No  Sinus pain/pressure: No  Runny nose: No  ST: No  ShOB: No  CP: No  Palps: No Abd pain: No Dysuria: No  N/V/C/D: No   Latex allergy?: No Allergic to chicken/eggs:: No Ever had a severe reaction to the flu shot before?: No Had a fever within the last 24 hours?: No History of Guillain-Barre syndrome?: No    Immunization given IM in LD. Pt tolerated immunization well without complications.   Patient advised to take Tylenol as needed if mild symptoms, such as body aches, should occur. Advised patient that if worse symptoms should arise, to give our office a call. Advised patient if ShOB or any symptom indicating a possible anaphylactic reaction should occur, to go immediately to the nearest emergency department. Patient verbalized understanding.   

## 2021-01-14 NOTE — Patient Instructions (Signed)
Influenza (Flu) Vaccine (Inactivated or Recombinant): What You Need to Know 1. Why get vaccinated? Influenza vaccine can prevent influenza (flu). Flu is a contagious disease that spreads around the [place] every year, usually between October and May. Anyone can get the flu, but it is more dangerous for some people. Infants and young children, people 65 years and older, pregnant people, and people with certain health conditions or a weakened immune system are at greatest risk of flu complications. Pneumonia, bronchitis, sinus infections, and ear infections are examples of flu-related complications. If you have a medical condition, such as heart disease, cancer, or diabetes, flu can make it worse. Flu can cause fever and chills, sore throat, muscle aches, fatigue, cough, headache, and runny or stuffy nose. Some people may have vomiting and diarrhea, though this is more common in children than adults. In an average year, thousands of people in the [place] die from flu, and many more are hospitalized. Flu vaccine prevents millions of illnesses and flu-related visits to the doctor each year. 2. Influenza vaccines CDC recommends everyone 6 months and older get vaccinated every flu season. Children 6 months through 8 years of age may need 2 doses during a single flu season. Everyone else needs only 1 dose each flu season. It takes about 2 weeks for protection to develop after vaccination. There are many flu viruses, and they are always changing. Each year a new flu vaccine is made to protect against the influenza viruses believed to be likely to cause disease in the upcoming flu season. Even when the vaccine doesn't exactly match these viruses, it may still provide some protection. Influenza vaccine does not cause flu. Influenza vaccine may be given at the same time as other vaccines. 3. Talk with your health care provider Tell your vaccination provider if the person getting the vaccine: Has had  an allergic reaction after a previous dose of influenza vaccine, or has any severe, life-threatening allergies Has ever had Guillain-Barr Syndrome (also called "GBS") In some cases, your health care provider may decide to postpone influenza vaccination until a future visit. Influenza vaccine can be administered at any time during pregnancy. People who are or will be pregnant during influenza season should receive inactivated influenza vaccine. People with minor illnesses, such as a cold, may be vaccinated. People who are moderately or severely ill should usually wait until they recover before getting influenza vaccine. Your health care provider can give you more information. 4. Risks of a vaccine reaction Soreness, redness, and swelling where the shot is given, fever, muscle aches, and headache can happen after influenza vaccination. There may be a very small increased risk of Guillain-Barr Syndrome (GBS) after inactivated influenza vaccine (the flu shot). Young children who get the flu shot along with pneumococcal vaccine (PCV13) and/or DTaP vaccine at the same time might be slightly more likely to have a seizure caused by fever. Tell your health care provider if a child who is getting flu vaccine has ever had a seizure. People sometimes faint after medical procedures, including vaccination. Tell your provider if you feel dizzy or have vision changes or ringing in the ears. As with any medicine, there is a very remote chance of a vaccine causing a severe allergic reaction, other serious injury, or death. 5. What if there is a serious problem? An allergic reaction could occur after the vaccinated person leaves the clinic. If you see signs of a severe allergic reaction (hives, swelling of the face and throat, difficulty breathing,   a fast heartbeat, dizziness, or weakness), call 9-1-1 and get the person to the nearest hospital. For other signs that concern you, call your health care provider. Adverse  reactions should be reported to the Vaccine Adverse Event Reporting System (VAERS). Your health care provider will usually file this report, or you can do it yourself. Visit the VAERS website at www.vaers.hhs.gov or call 1-800-822-7967. VAERS is only for reporting reactions, and VAERS staff members do not give medical advice. 6. The National Vaccine Injury Compensation Program The National Vaccine Injury Compensation Program (VICP) is a federal program that was created to compensate people who may have been injured by certain vaccines. Claims regarding alleged injury or death due to vaccination have a time limit for filing, which may be as short as two years. Visit the VICP website at www.hrsa.gov/vaccinecompensation or call 1-800-338-2382 to learn about the program and about filing a claim. 7. How can I learn more? Ask your health care provider. Call your local or state health department. Visit the website of the Food and Drug Administration (FDA) for vaccine package inserts and additional information at www.fda.gov/vaccines-blood-biologics/vaccines. Contact the Centers for Disease Control and Prevention (CDC): Call 1-800-232-4636 (1-800-CDC-INFO) or Visit CDC's website at www.cdc.gov/flu. Vaccine Information Statement Inactivated Influenza Vaccine (12/02/2019) This information is not intended to replace advice given to you by your health care provider. Make sure you discuss any questions you have with your health care provider. Document Revised: 01/19/2020 Document Reviewed: 01/19/2020 Elsevier Patient Education  2022 Elsevier Inc.  

## 2021-01-14 NOTE — Addendum Note (Signed)
Addended by: Thermon Leyland on: 01/14/2021 02:57 PM   Modules accepted: Level of Service

## 2021-01-30 ENCOUNTER — Other Ambulatory Visit: Payer: Self-pay | Admitting: Sports Medicine

## 2021-01-30 DIAGNOSIS — M5030 Other cervical disc degeneration, unspecified cervical region: Secondary | ICD-10-CM

## 2021-04-21 DIAGNOSIS — G4733 Obstructive sleep apnea (adult) (pediatric): Secondary | ICD-10-CM | POA: Diagnosis not present

## 2021-05-22 DIAGNOSIS — G4733 Obstructive sleep apnea (adult) (pediatric): Secondary | ICD-10-CM | POA: Diagnosis not present

## 2021-06-09 ENCOUNTER — Other Ambulatory Visit: Payer: Self-pay | Admitting: Sports Medicine

## 2021-06-09 DIAGNOSIS — M5030 Other cervical disc degeneration, unspecified cervical region: Secondary | ICD-10-CM

## 2021-06-12 DIAGNOSIS — N400 Enlarged prostate without lower urinary tract symptoms: Secondary | ICD-10-CM | POA: Diagnosis present

## 2021-06-12 DIAGNOSIS — R972 Elevated prostate specific antigen [PSA]: Secondary | ICD-10-CM | POA: Diagnosis not present

## 2021-06-22 DIAGNOSIS — G4733 Obstructive sleep apnea (adult) (pediatric): Secondary | ICD-10-CM | POA: Diagnosis not present

## 2021-06-27 ENCOUNTER — Other Ambulatory Visit: Payer: Self-pay | Admitting: Sports Medicine

## 2021-06-27 DIAGNOSIS — R972 Elevated prostate specific antigen [PSA]: Secondary | ICD-10-CM

## 2021-06-27 DIAGNOSIS — E7800 Pure hypercholesterolemia, unspecified: Secondary | ICD-10-CM

## 2021-06-27 NOTE — Progress Notes (Signed)
[id number] ?

## 2021-07-02 DIAGNOSIS — R972 Elevated prostate specific antigen [PSA]: Secondary | ICD-10-CM | POA: Diagnosis not present

## 2021-07-02 DIAGNOSIS — E7800 Pure hypercholesterolemia, unspecified: Secondary | ICD-10-CM | POA: Diagnosis not present

## 2021-07-03 ENCOUNTER — Encounter: Payer: Self-pay | Admitting: Sports Medicine

## 2021-07-04 LAB — CBC
HCT: 46.4 % (ref 38.5–50.0)
Hemoglobin: 15.9 g/dL (ref 13.2–17.1)
MCH: 31.1 pg (ref 27.0–33.0)
MCHC: 34.3 g/dL (ref 32.0–36.0)
MCV: 90.8 fL (ref 80.0–100.0)
MPV: 10.2 fL (ref 7.5–12.5)
Platelets: 182 10*3/uL (ref 140–400)
RBC: 5.11 10*6/uL (ref 4.20–5.80)
RDW: 13 % (ref 11.0–15.0)
WBC: 4 10*3/uL (ref 3.8–10.8)

## 2021-07-04 LAB — COMPREHENSIVE METABOLIC PANEL
AG Ratio: 1.7 (calc) (ref 1.0–2.5)
ALT: 72 U/L — ABNORMAL HIGH (ref 9–46)
AST: 55 U/L — ABNORMAL HIGH (ref 10–35)
Albumin: 4.7 g/dL (ref 3.6–5.1)
Alkaline phosphatase (APISO): 79 U/L (ref 35–144)
BUN: 18 mg/dL (ref 7–25)
CO2: 27 mmol/L (ref 20–32)
Calcium: 9.7 mg/dL (ref 8.6–10.3)
Chloride: 103 mmol/L (ref 98–110)
Creat: 1.13 mg/dL (ref 0.70–1.35)
Globulin: 2.7 g/dL (calc) (ref 1.9–3.7)
Glucose, Bld: 115 mg/dL — ABNORMAL HIGH (ref 65–99)
Potassium: 4.7 mmol/L (ref 3.5–5.3)
Sodium: 138 mmol/L (ref 135–146)
Total Bilirubin: 1.9 mg/dL — ABNORMAL HIGH (ref 0.2–1.2)
Total Protein: 7.4 g/dL (ref 6.1–8.1)

## 2021-07-04 LAB — LIPID PANEL
Cholesterol: 127 mg/dL (ref <200)
HDL: 36 mg/dL — ABNORMAL LOW (ref >=40)
LDL Cholesterol (Calc): 71 mg/dL (calc)
Non-HDL Cholesterol (Calc): 91 mg/dL (calc) (ref <130)
Total CHOL/HDL Ratio: 3.5 (calc) (ref <5.0)
Triglycerides: 123 mg/dL (ref <150)

## 2021-07-04 LAB — HEMOGLOBIN A1C
Hgb A1c MFr Bld: 6.1 % of total Hgb — ABNORMAL HIGH (ref <5.7)
Mean Plasma Glucose: 128 mg/dL
eAG (mmol/L): 7.1 mmol/L

## 2021-07-04 LAB — PSA, TOTAL AND FREE
PSA, % Free: 32 % (calc) (ref >25)
PSA, Free: 1.6 ng/mL
PSA, Total: 5 ng/mL — ABNORMAL HIGH (ref <=4.0)

## 2021-07-04 LAB — TSH: TSH: 3.14 mIU/L (ref 0.40–4.50)

## 2021-07-09 ENCOUNTER — Encounter: Payer: Self-pay | Admitting: Sports Medicine

## 2021-07-09 ENCOUNTER — Other Ambulatory Visit: Payer: Self-pay

## 2021-07-09 ENCOUNTER — Ambulatory Visit (INDEPENDENT_AMBULATORY_CARE_PROVIDER_SITE_OTHER): Payer: BC Managed Care – PPO | Admitting: Sports Medicine

## 2021-07-09 VITALS — BP 135/79 | HR 72 | Ht 67.0 in | Wt 245.0 lb

## 2021-07-09 DIAGNOSIS — R972 Elevated prostate specific antigen [PSA]: Secondary | ICD-10-CM

## 2021-07-09 DIAGNOSIS — Z6838 Body mass index (BMI) 38.0-38.9, adult: Secondary | ICD-10-CM

## 2021-07-09 DIAGNOSIS — Z0000 Encounter for general adult medical examination without abnormal findings: Secondary | ICD-10-CM | POA: Diagnosis not present

## 2021-07-09 DIAGNOSIS — R7303 Prediabetes: Secondary | ICD-10-CM

## 2021-07-09 DIAGNOSIS — E7800 Pure hypercholesterolemia, unspecified: Secondary | ICD-10-CM

## 2021-07-09 NOTE — Assessment & Plan Note (Signed)
Brandon Riley returns, he has gained about 30 pounds over the past few years. ?Hemoglobin A1c is now in the prediabetic range. ?He will work aggressively on low carbohydrate diet, exercise. ?We can recheck in 3 months. ?If insufficient weight loss or improvement in hemoglobin A1c in 3 months we will consider the addition of a GLP-1. ?

## 2021-07-09 NOTE — Assessment & Plan Note (Signed)
Lipids looked okay, likely secondary to his cholesterol medication, he could use a bit more exercise. ?

## 2021-07-09 NOTE — Assessment & Plan Note (Signed)
Annual physical as above, updated on screening measures. ?

## 2021-07-09 NOTE — Assessment & Plan Note (Signed)
Hemoglobin A1c elevated into the prediabetic range, he will work aggressively on weight loss and a low carbohydrate diet with a 61-month recheck. ?

## 2021-07-09 NOTE — Progress Notes (Signed)
?Subjective:   ? ?CC: Annual Physical Exam ? ?HPI:  ?This patient is here for their annual physical ? ?I reviewed the past medical history, family history, social history, surgical history, and allergies today and no changes were needed.  Please see the problem list section below in epic for further details. ? ?Past Medical History: ?Past Medical History:  ?Diagnosis Date  ? GERD (gastroesophageal reflux disease)   ? Hyperlipemia   ? Restless leg syndrome   ? Sleep apnea   ? w cpap  ? ?Past Surgical History: ?Past Surgical History:  ?Procedure Laterality Date  ? CHONDROPLASTY Left 10/28/2018  ? Procedure: CHONDROPLASTY;  Surgeon: Hiram Gash, MD;  Location: [place];  Service: Orthopedics;  Laterality: Left;  ? KNEE ARTHROSCOPY WITH MEDIAL MENISECTOMY Left 10/28/2018  ? Procedure: LEFT KNEE ARTHROSCOPY WITH MEDIAL MENISECTOMY;  Surgeon: Hiram Gash, MD;  Location: [place];  Service: Orthopedics;  Laterality: Left;  ? RECONSTRUCTION OF NOSE  2019  ? [place] EXTRACTION  2012  ? ?Social History: ?Social History  ? ?Socioeconomic History  ? Marital status: Married  ?  Spouse name: Not on file  ? Number of children: Not on file  ? Years of education: Not on file  ? Highest education level: Not on file  ?Occupational History  ? Not on file  ?Tobacco Use  ? Smoking status: Never  ? Smokeless tobacco: Never  ?Vaping Use  ? Vaping Use: Never used  ?Substance and Sexual Activity  ? Alcohol use: Yes  ?  Comment: 2-3x per week  ? Drug use: Never  ? Sexual activity: Not on file  ?Other Topics Concern  ? Not on file  ?Social History Narrative  ? Not on file  ? ?Social Determinants of Health  ? ?Financial Resource Strain: Not on file  ?Food Insecurity: Not on file  ?Transportation Needs: Not on file  ?Physical Activity: Not on file  ?Stress: Not on file  ?Social Connections: Not on file  ? ?Family History: ?No family history on file. ?Allergies: ?Allergies  ?Allergen Reactions  ? Celebrex  [Celecoxib] Palpitations  ?  Heavy breathing  ? Flexeril [Cyclobenzaprine] Hives  ? ?Medications: See med rec. ? ?Review of Systems: No headache, visual changes, nausea, vomiting, diarrhea, constipation, dizziness, abdominal pain, skin rash, fevers, chills, night sweats, swollen lymph nodes, weight loss, chest pain, body aches, joint swelling, muscle aches, shortness of breath, mood changes, visual or auditory hallucinations. ? ?Objective:   ? ?General: Well Developed, well nourished, and in no acute distress.  ?Neuro: Alert and oriented x3, extra-ocular muscles intact, sensation grossly intact. Cranial nerves II through XII are intact, motor, sensory, and coordinative functions are all intact. ?HEENT: Normocephalic, atraumatic, pupils equal round reactive to light, neck supple, no masses, no lymphadenopathy, thyroid nonpalpable. Oropharynx, nasopharynx, external ear canals are unremarkable. ?Skin: Warm and dry, no rashes noted.  ?Cardiac: Regular rate and rhythm, no murmurs rubs or gallops.  ?Respiratory: Clear to auscultation bilaterally. Not using accessory muscles, speaking in full sentences.  ?Abdominal: Soft, nontender, nondistended, positive bowel sounds, no masses, no organomegaly.  ?Musculoskeletal: Shoulder, elbow, wrist, hip, knee, ankle stable, and with full range of motion. ? ?Impression and Recommendations:   ? ?The patient was counselled, risk factors were discussed, anticipatory guidance given. ? ?Annual physical exam ?Annual physical as above, updated on screening measures. ? ?Elevated PSA ?PSA did improve on the most recent check, we will keep an eye on this yearly. ? ?Hyperlipidemia ?  Lipids looked okay, likely secondary to his cholesterol medication, he could use a bit more exercise. ? ?Obesity ?[place] returns, he has gained about 30 pounds over the past few years. ?Hemoglobin A1c is now in the prediabetic range. ?He will work aggressively on low carbohydrate diet, exercise. ?We can recheck in 3  months. ?If insufficient weight loss or improvement in hemoglobin A1c in 3 months we will consider the addition of a GLP-1. ? ?Prediabetes ?Hemoglobin A1c elevated into the prediabetic range, he will work aggressively on weight loss and a low carbohydrate diet with a 4-month recheck. ? ? ?___________________________________________ ?Gwen Her. Dianah Field, M.D., ABFM., CAQSM. ?Primary Care and Sports Medicine ?[place] ? ?Adjunct Professor of Family Medicine  ?[employer] ?

## 2021-07-09 NOTE — Assessment & Plan Note (Signed)
PSA did improve on the most recent check, we will keep an eye on this yearly. ?

## 2021-07-19 ENCOUNTER — Other Ambulatory Visit: Payer: Self-pay | Admitting: Sports Medicine

## 2021-07-19 DIAGNOSIS — E7800 Pure hypercholesterolemia, unspecified: Secondary | ICD-10-CM

## 2021-07-20 DIAGNOSIS — G4733 Obstructive sleep apnea (adult) (pediatric): Secondary | ICD-10-CM | POA: Diagnosis not present

## 2021-08-20 DIAGNOSIS — G4733 Obstructive sleep apnea (adult) (pediatric): Secondary | ICD-10-CM | POA: Diagnosis not present

## 2021-09-11 ENCOUNTER — Other Ambulatory Visit: Payer: Self-pay | Admitting: Sports Medicine

## 2021-09-11 DIAGNOSIS — G4733 Obstructive sleep apnea (adult) (pediatric): Secondary | ICD-10-CM

## 2021-09-19 DIAGNOSIS — G4733 Obstructive sleep apnea (adult) (pediatric): Secondary | ICD-10-CM | POA: Diagnosis not present

## 2021-10-01 ENCOUNTER — Encounter: Payer: Self-pay | Admitting: Sports Medicine

## 2021-10-01 DIAGNOSIS — R972 Elevated prostate specific antigen [PSA]: Secondary | ICD-10-CM

## 2021-10-01 DIAGNOSIS — R7303 Prediabetes: Secondary | ICD-10-CM

## 2021-10-01 DIAGNOSIS — G4733 Obstructive sleep apnea (adult) (pediatric): Secondary | ICD-10-CM

## 2021-10-01 DIAGNOSIS — E7800 Pure hypercholesterolemia, unspecified: Secondary | ICD-10-CM

## 2021-10-07 DIAGNOSIS — R7303 Prediabetes: Secondary | ICD-10-CM | POA: Diagnosis not present

## 2021-10-07 DIAGNOSIS — R972 Elevated prostate specific antigen [PSA]: Secondary | ICD-10-CM | POA: Diagnosis not present

## 2021-10-07 DIAGNOSIS — E7800 Pure hypercholesterolemia, unspecified: Secondary | ICD-10-CM | POA: Diagnosis not present

## 2021-10-08 LAB — COMPLETE METABOLIC PANEL WITH GFR
AG Ratio: 1.6 (calc) (ref 1.0–2.5)
ALT: 40 U/L (ref 9–46)
AST: 32 U/L (ref 10–35)
Albumin: 4.6 g/dL (ref 3.6–5.1)
Alkaline phosphatase (APISO): 72 U/L (ref 35–144)
BUN: 11 mg/dL (ref 7–25)
CO2: 23 mmol/L (ref 20–32)
Calcium: 10 mg/dL (ref 8.6–10.3)
Chloride: 105 mmol/L (ref 98–110)
Creat: 0.97 mg/dL (ref 0.70–1.35)
Globulin: 2.9 g/dL (calc) (ref 1.9–3.7)
Glucose, Bld: 90 mg/dL (ref 65–139)
Potassium: 4.1 mmol/L (ref 3.5–5.3)
Sodium: 138 mmol/L (ref 135–146)
Total Bilirubin: 1.6 mg/dL — ABNORMAL HIGH (ref 0.2–1.2)
Total Protein: 7.5 g/dL (ref 6.1–8.1)
eGFR: 89 mL/min/{1.73_m2} (ref >=60)

## 2021-10-08 LAB — HEMOGLOBIN A1C
Hgb A1c MFr Bld: 5.6 % of total Hgb (ref <5.7)
Mean Plasma Glucose: 114 mg/dL
eAG (mmol/L): 6.3 mmol/L

## 2021-10-08 LAB — PSA, TOTAL AND FREE
PSA, % Free: 41 % (calc) (ref >25)
PSA, Free: 2 ng/mL
PSA, Total: 4.9 ng/mL — ABNORMAL HIGH (ref <=4.0)

## 2021-10-08 LAB — TSH: TSH: 4.08 mIU/L (ref 0.40–4.50)

## 2021-10-08 LAB — CBC
HCT: 46.7 % (ref 38.5–50.0)
Hemoglobin: 15.9 g/dL (ref 13.2–17.1)
MCH: 30.7 pg (ref 27.0–33.0)
MCHC: 34 g/dL (ref 32.0–36.0)
MCV: 90.2 fL (ref 80.0–100.0)
MPV: 10.1 fL (ref 7.5–12.5)
Platelets: 197 10*3/uL (ref 140–400)
RBC: 5.18 10*6/uL (ref 4.20–5.80)
RDW: 12.9 % (ref 11.0–15.0)
WBC: 4.4 10*3/uL (ref 3.8–10.8)

## 2021-10-08 LAB — LIPID PANEL
Cholesterol: 131 mg/dL (ref <200)
HDL: 39 mg/dL — ABNORMAL LOW (ref >=40)
LDL Cholesterol (Calc): 68 mg/dL (calc)
Non-HDL Cholesterol (Calc): 92 mg/dL (calc) (ref <130)
Total CHOL/HDL Ratio: 3.4 (calc) (ref <5.0)
Triglycerides: 159 mg/dL — ABNORMAL HIGH (ref <150)

## 2021-10-09 ENCOUNTER — Ambulatory Visit: Payer: BC Managed Care – PPO | Admitting: Sports Medicine

## 2021-10-10 ENCOUNTER — Other Ambulatory Visit: Payer: Self-pay | Admitting: Sports Medicine

## 2021-10-10 DIAGNOSIS — M5030 Other cervical disc degeneration, unspecified cervical region: Secondary | ICD-10-CM

## 2021-10-14 ENCOUNTER — Ambulatory Visit: Payer: BC Managed Care – PPO | Admitting: Sports Medicine

## 2021-10-14 ENCOUNTER — Encounter: Payer: Self-pay | Admitting: Sports Medicine

## 2021-10-14 DIAGNOSIS — Z6838 Body mass index (BMI) 38.0-38.9, adult: Secondary | ICD-10-CM

## 2021-10-14 NOTE — Assessment & Plan Note (Signed)
Brandon Riley returns, he is done a Insurance account manager job, he has lost about 10 pounds, his A1c has improved, lipids, blood pressure all normal now. He will continue what he is doing, which is an hour of cardio 5 or so times a week, and cutting out his carbohydrates. If he plateaus am happy to try GLP-1.

## 2021-10-14 NOTE — Progress Notes (Signed)
    Procedures performed today:    None.  Independent interpretation of notes and tests performed by another provider:   None.  Brief History, Exam, Impression, and Recommendations:    Obesity Bee returns, he is done a fantastic job, he has lost about 10 pounds, his A1c has improved, lipids, blood pressure all normal now. He will continue what he is doing, which is an hour of cardio 5 or so times a week, and cutting out his carbohydrates. If he plateaus am happy to try GLP-1.    ___________________________________________ Ihor Austin. Benjamin Stain, M.D., ABFM., CAQSM. Primary Care and Sports Medicine [facility]  Adjunct Instructor of Family Medicine  [employer]

## 2021-10-20 DIAGNOSIS — G4733 Obstructive sleep apnea (adult) (pediatric): Secondary | ICD-10-CM | POA: Diagnosis not present

## 2021-11-19 DIAGNOSIS — G4733 Obstructive sleep apnea (adult) (pediatric): Secondary | ICD-10-CM | POA: Diagnosis not present

## 2021-12-31 DIAGNOSIS — G4733 Obstructive sleep apnea (adult) (pediatric): Secondary | ICD-10-CM | POA: Diagnosis not present

## 2022-01-08 ENCOUNTER — Ambulatory Visit (INDEPENDENT_AMBULATORY_CARE_PROVIDER_SITE_OTHER): Payer: BC Managed Care – PPO | Admitting: Sports Medicine

## 2022-01-08 DIAGNOSIS — Z23 Encounter for immunization: Secondary | ICD-10-CM | POA: Diagnosis not present

## 2022-01-21 ENCOUNTER — Telehealth: Payer: Self-pay | Admitting: Sports Medicine

## 2022-01-21 ENCOUNTER — Ambulatory Visit (INDEPENDENT_AMBULATORY_CARE_PROVIDER_SITE_OTHER): Payer: BC Managed Care – PPO

## 2022-01-21 ENCOUNTER — Ambulatory Visit: Payer: BC Managed Care – PPO | Admitting: Sports Medicine

## 2022-01-21 DIAGNOSIS — S83231D Complex tear of medial meniscus, current injury, right knee, subsequent encounter: Secondary | ICD-10-CM | POA: Diagnosis not present

## 2022-01-21 DIAGNOSIS — Z6838 Body mass index (BMI) 38.0-38.9, adult: Secondary | ICD-10-CM

## 2022-01-21 MED ORDER — PHENTERMINE HCL 37.5 MG PO TABS: ORAL_TABLET | ORAL | 0 refills | Status: DC

## 2022-01-21 NOTE — Telephone Encounter (Signed)
Visco approval, bilateral, though we will probably start with the right side, x-ray confirmed osteoarthritis post injections and arthroscopy, and greater than 6 weeks of physician directed conservative treatment.

## 2022-01-21 NOTE — Assessment & Plan Note (Signed)
This is a very pleasant 61 year old male, he has a history of a right knee degenerative meniscal tear, he is post arthroscopic debridement of the meniscal tear with removal of intra-articular loose bodies. Unfortunately having recurrence of discomfort 1 year postop. We injected his knee today. I would like to get him approved for viscosupplementation as he does have documented osteoarthritis. We will also help him with weight loss.

## 2022-01-21 NOTE — Progress Notes (Signed)
    Procedures performed today:    Procedure: Real-time Ultrasound Guided injection of the right knee Device: Samsung HS60  Verbal informed consent obtained.  Time-out conducted.  Noted no overlying erythema, induration, or other signs of local infection.  Skin prepped in a sterile fashion.  Local anesthesia: Topical Ethyl chloride.  With sterile technique and under real time ultrasound guidance: No effusion noted, 1 cc Kenalog 40, 2 cc lidocaine, 2 cc bupivacaine injected easily Completed without difficulty  Advised to call if fevers/chills, erythema, induration, drainage, or persistent bleeding.  Images permanently stored and available for review in PACS.  Impression: Technically successful ultrasound guided injection.  Independent interpretation of notes and tests performed by another provider:   None.  Brief History, Exam, Impression, and Recommendations:    Right knee degenerative meniscal tear This is a very pleasant 61 year old male, he has a history of a right knee degenerative meniscal tear, he is post arthroscopic debridement of the meniscal tear with removal of intra-articular loose bodies. Unfortunately having recurrence of discomfort 1 year postop. We injected his knee today. I would like to get him approved for viscosupplementation as he does have documented osteoarthritis. We will also help him with weight loss.  Obesity Desire start phentermine, we will start today and do monthly weight checks and refills, if he fails or plateaus we will switch to a GLP-1.    ____________________________________________ Gwen Her. Dianah Field, M.D., ABFM., CAQSM., AME. Primary Care and Sports Medicine [place] [facility]  Adjunct Professor of [place] [employer] [facility] [facility]  Risk manager

## 2022-01-21 NOTE — Assessment & Plan Note (Signed)
Desire start phentermine, we will start today and do monthly weight checks and refills, if he fails or plateaus we will switch to a GLP-1.

## 2022-01-22 NOTE — Telephone Encounter (Signed)
MyVisco paperwork faxed to [employer] at [phone] Request is for Orthovisc Pt's insurance prefers Orthovisc Fax confirmation receipt received

## 2022-01-23 NOTE — Telephone Encounter (Signed)
Benefits Investigation Details received from [employer] Injection: Synvisc  Medical: Deductible does not apply. Both the drug and procedure are covered at 100%. If OV is billed, the copay with apply. Once the OOP has been met, pt id covered at 100%.  PA required: Yes  PA good from 01/22/22 thru 07/20/22 REF# [facility]  Pharmacy: Product not covered under the pharmacy plan.   Specialty Pharmacy: [facility]  May fill through: [place] Copay/Coinsurance: $226 Product Copay:   Administration Coinsurance:  Administration Copay:  Deductible:  Out of Pocket Max: [phone] (met: [phone])    Spoke with patient, he stated that the injection he got earlier this week is working Pharmacologist. He wants to wait to discuss at his follow up about proceeding with gel injections. Patient aware the medication will need to be ordered.

## 2022-01-28 NOTE — Telephone Encounter (Signed)
Medication was received today. Please call patient to schedule once weekly injections for 3 weeks.

## 2022-01-28 NOTE — Telephone Encounter (Signed)
Pt stated that he spoke to you about this injection and that he would let you know. At this moment everything is working Pharmacologist. He stated that he will let you know when he needs this injection. He will follow up with Dr. Darene Lamer in about 2 weeks. tvt

## 2022-02-12 ENCOUNTER — Other Ambulatory Visit: Payer: Self-pay | Admitting: Sports Medicine

## 2022-02-12 DIAGNOSIS — M5030 Other cervical disc degeneration, unspecified cervical region: Secondary | ICD-10-CM

## 2022-02-18 ENCOUNTER — Ambulatory Visit: Payer: BC Managed Care – PPO | Admitting: Sports Medicine

## 2022-02-21 ENCOUNTER — Ambulatory Visit: Payer: BC Managed Care – PPO | Admitting: Sports Medicine

## 2022-02-21 ENCOUNTER — Encounter: Payer: Self-pay | Admitting: Sports Medicine

## 2022-02-21 DIAGNOSIS — M1712 Unilateral primary osteoarthritis, left knee: Secondary | ICD-10-CM | POA: Diagnosis not present

## 2022-02-21 DIAGNOSIS — Z6838 Body mass index (BMI) 38.0-38.9, adult: Secondary | ICD-10-CM | POA: Diagnosis not present

## 2022-02-21 DIAGNOSIS — E66812 Obesity, class 2: Secondary | ICD-10-CM

## 2022-02-21 NOTE — Progress Notes (Signed)
    Procedures performed today:    None.  Independent interpretation of notes and tests performed by another provider:   None.  Brief History, Exam, Impression, and Recommendations:    Primary osteoarthritis with pseudogout of left knee Initially did well after arthroscopy and then injection, unfortunately he started to have additional locking and catching. I think he needs to see Dr. Griffin Basil again before we proceed with viscosupplementation.  We do have the syringes here in the Visco drawer.  Obesity Phentermine not effective, discontinue, he would like to do the weight loss on his own, we are aiming for 5 to 7 pounds over the next 60 days.    ____________________________________________ Gwen Her. Dianah Field, M.D., ABFM., CAQSM., AME. Primary Care and Sports Medicine [place] [facility]  Adjunct Professor of [place] of [facility] of Medicine  Risk manager

## 2022-02-21 NOTE — Assessment & Plan Note (Addendum)
Initially did well after arthroscopy and then injection, unfortunately he started to have additional locking and catching. I think he needs to see Dr. Griffin Basil again before we proceed with viscosupplementation.  We do have the syringes here in the Visco drawer.

## 2022-02-21 NOTE — Assessment & Plan Note (Signed)
Phentermine not effective, discontinue, he would like to do the weight loss on his own, we are aiming for 5 to 7 pounds over the next 60 days.

## 2022-02-28 ENCOUNTER — Other Ambulatory Visit: Payer: Self-pay | Admitting: Orthopaedic Surgery

## 2022-02-28 DIAGNOSIS — M2341 Loose body in knee, right knee: Secondary | ICD-10-CM | POA: Diagnosis not present

## 2022-03-01 ENCOUNTER — Ambulatory Visit (INDEPENDENT_AMBULATORY_CARE_PROVIDER_SITE_OTHER): Payer: BC Managed Care – PPO

## 2022-03-01 DIAGNOSIS — M2341 Loose body in knee, right knee: Secondary | ICD-10-CM | POA: Diagnosis not present

## 2022-03-01 DIAGNOSIS — G8929 Other chronic pain: Secondary | ICD-10-CM

## 2022-03-01 DIAGNOSIS — M25561 Pain in right knee: Secondary | ICD-10-CM | POA: Diagnosis not present

## 2022-03-03 ENCOUNTER — Encounter: Payer: Self-pay | Admitting: Sports Medicine

## 2022-03-03 ENCOUNTER — Other Ambulatory Visit: Payer: Self-pay

## 2022-03-03 DIAGNOSIS — M5030 Other cervical disc degeneration, unspecified cervical region: Secondary | ICD-10-CM

## 2022-03-03 MED ORDER — GABAPENTIN 300 MG PO CAPS: ORAL_CAPSULE | ORAL | 1 refills | Status: DC

## 2022-03-04 ENCOUNTER — Telehealth: Payer: Self-pay | Admitting: Sports Medicine

## 2022-03-04 DIAGNOSIS — M2341 Loose body in knee, right knee: Secondary | ICD-10-CM | POA: Diagnosis not present

## 2022-03-04 NOTE — Telephone Encounter (Signed)
Received a call from Dr. Griffin Basil, they would like [place] to try Visco before they consider knee replacement.  Please work on Brandon Riley left knee, he has failed everything including steroid injections, arthroscopy, greater than 6 weeks of home PT with x-rays and MRI confirmed arthritis.

## 2022-03-05 ENCOUNTER — Ambulatory Visit (INDEPENDENT_AMBULATORY_CARE_PROVIDER_SITE_OTHER): Payer: BC Managed Care – PPO | Admitting: Sports Medicine

## 2022-03-05 ENCOUNTER — Ambulatory Visit (INDEPENDENT_AMBULATORY_CARE_PROVIDER_SITE_OTHER): Payer: BC Managed Care – PPO

## 2022-03-05 ENCOUNTER — Ambulatory Visit: Payer: Self-pay

## 2022-03-05 DIAGNOSIS — M1712 Unilateral primary osteoarthritis, left knee: Secondary | ICD-10-CM | POA: Diagnosis not present

## 2022-03-05 DIAGNOSIS — S83231D Complex tear of medial meniscus, current injury, right knee, subsequent encounter: Secondary | ICD-10-CM

## 2022-03-05 MED ORDER — HYLAN G-F 20 16 MG/2ML IX SOSY
16.0000 mg | PREFILLED_SYRINGE | Freq: Once | INTRA_ARTICULAR | Status: AC
  Administered 2022-03-05: 16 mg via INTRA_ARTICULAR

## 2022-03-05 MED ORDER — OXYCODONE-ACETAMINOPHEN 10-325 MG PO TABS: 0.5000 | ORAL_TABLET | Freq: Three times a day (TID) | ORAL | 0 refills | Status: DC | PRN

## 2022-03-05 NOTE — Assessment & Plan Note (Signed)
Brandon Riley did see Dr. Griffin Basil, they cannot do an arthroscopy due to the degree of osteoarthritis in his knee, the recommendation at this point is to finish the series of viscosupplementation before considering arthroplasty if insufficient improvement. Today we did Synvisc injection #1 of 3 into the right knee, return in 1 week for #2 of 3. He is having a lot of pain so we will do a short course of Percocet.

## 2022-03-05 NOTE — Addendum Note (Signed)
Addended by: Tarri Glenn A on: 03/05/2022 11:42 AM   Modules accepted: Orders

## 2022-03-05 NOTE — Telephone Encounter (Signed)
Pt was previously approved for bilateral injections in September. The Josem Kaufmann is good thru March 2024. The synvisc is in the drawer.

## 2022-03-05 NOTE — Progress Notes (Signed)
    Procedures performed today:    Procedure: Real-time Ultrasound Guided injection of the right knee Device: Samsung HS60  Verbal informed consent obtained.  Time-out conducted.  Noted no overlying erythema, induration, or other signs of local infection.  Skin prepped in a sterile fashion.  Local anesthesia: Topical Ethyl chloride.  With sterile technique and under real time ultrasound guidance: No effusion noted, I injected 1 syringe of Synvisc into the suprapatellar recess through a 22-gauge needle. Completed without difficulty  Advised to call if fevers/chills, erythema, induration, drainage, or persistent bleeding.  Images permanently stored and available for review in PACS.  Impression: Technically successful ultrasound guided injection.  Independent interpretation of notes and tests performed by another provider:   None.  Brief History, Exam, Impression, and Recommendations:    Right knee degenerative meniscal tear Mackie did see Dr. Griffin Basil, they cannot do an arthroscopy due to the degree of osteoarthritis in his knee, the recommendation at this point is to finish the series of viscosupplementation before considering arthroplasty if insufficient improvement. Today we did Synvisc injection #1 of 3 into the right knee, return in 1 week for #2 of 3. He is having a lot of pain so we will do a short course of Percocet.    ____________________________________________ Gwen Her. Dianah Field, M.D., ABFM., CAQSM., AME. Primary Care and Sports Medicine [place] [facility]  Adjunct Professor of [place] of [facility] of Medicine  Risk manager

## 2022-03-11 ENCOUNTER — Ambulatory Visit: Payer: BC Managed Care – PPO | Admitting: Sports Medicine

## 2022-03-11 ENCOUNTER — Ambulatory Visit (INDEPENDENT_AMBULATORY_CARE_PROVIDER_SITE_OTHER): Payer: BC Managed Care – PPO

## 2022-03-11 DIAGNOSIS — S83231D Complex tear of medial meniscus, current injury, right knee, subsequent encounter: Secondary | ICD-10-CM

## 2022-03-11 DIAGNOSIS — M1712 Unilateral primary osteoarthritis, left knee: Secondary | ICD-10-CM

## 2022-03-11 DIAGNOSIS — M1711 Unilateral primary osteoarthritis, right knee: Secondary | ICD-10-CM | POA: Diagnosis not present

## 2022-03-11 MED ORDER — HYLAN G-F 20 16 MG/2ML IX SOSY
16.0000 mg | PREFILLED_SYRINGE | Freq: Once | INTRA_ARTICULAR | Status: AC
  Administered 2022-03-11: 16 mg via INTRA_ARTICULAR

## 2022-03-11 NOTE — Addendum Note (Signed)
Addended by: Tarri Glenn A on: 03/11/2022 04:59 PM   Modules accepted: Orders

## 2022-03-11 NOTE — Assessment & Plan Note (Addendum)
Synvisc No. 2 of 3 right knee, return in 1 week for #3 of 3. We will do his weight check at the next visit.

## 2022-03-11 NOTE — Progress Notes (Signed)
    Procedures performed today:    Procedure: Real-time Ultrasound Guided injection of the right knee Device: Samsung HS60  Verbal informed consent obtained.  Time-out conducted.  Noted no overlying erythema, induration, or other signs of local infection.  Skin prepped in a sterile fashion.  Local anesthesia: Topical Ethyl chloride.  With sterile technique and under real time ultrasound guidance: No effusion noted, I injected 1 syringe of Synvisc into the suprapatellar recess through a 22-gauge needle. Completed without difficulty  Advised to call if fevers/chills, erythema, induration, drainage, or persistent bleeding.  Images permanently stored and available for review in PACS.  Impression: Technically successful ultrasound guided injection.  Independent interpretation of notes and tests performed by another provider:   None.  Brief History, Exam, Impression, and Recommendations:    Right knee degenerative meniscal tear Synvisc No. 2 of 3 right knee, return in 1 week for #3 of 3. We will do his weight check at the next visit.    ____________________________________________ Gwen Her. Dianah Field, M.D., ABFM., CAQSM., AME. Primary Care and Sports Medicine [place] [facility]  Adjunct Professor of [place] of [facility] of Medicine  Risk manager

## 2022-03-18 ENCOUNTER — Ambulatory Visit: Payer: BC Managed Care – PPO | Admitting: Sports Medicine

## 2022-03-18 ENCOUNTER — Ambulatory Visit (INDEPENDENT_AMBULATORY_CARE_PROVIDER_SITE_OTHER): Payer: BC Managed Care – PPO

## 2022-03-18 DIAGNOSIS — M1711 Unilateral primary osteoarthritis, right knee: Secondary | ICD-10-CM

## 2022-03-18 DIAGNOSIS — S83231D Complex tear of medial meniscus, current injury, right knee, subsequent encounter: Secondary | ICD-10-CM

## 2022-03-18 MED ORDER — HYLAN G-F 20 16 MG/2ML IX SOSY
16.0000 mg | PREFILLED_SYRINGE | Freq: Once | INTRA_ARTICULAR | Status: AC
  Administered 2022-03-18: 16 mg via INTRA_ARTICULAR

## 2022-03-18 MED ORDER — TRIAMCINOLONE ACETONIDE 40 MG/ML IJ SUSP
40.0000 mg | Freq: Once | INTRAMUSCULAR | Status: AC
  Administered 2022-03-18: 40 mg via INTRAMUSCULAR

## 2022-03-18 NOTE — Progress Notes (Signed)
    Procedures performed today:    Procedure: Real-time Ultrasound Guided injection of the right knee Device: Samsung HS60  Verbal informed consent obtained.  Time-out conducted.  Noted no overlying erythema, induration, or other signs of local infection.  Skin prepped in a sterile fashion.  Local anesthesia: Topical Ethyl chloride.  With sterile technique and under real time ultrasound guidance: No effusion noted, I injected 1 syringe of Synvisc into the suprapatellar recess through a 22-gauge needle. Completed without difficulty  Advised to call if fevers/chills, erythema, induration, drainage, or persistent bleeding.  Images permanently stored and available for review in PACS.  Impression: Technically successful ultrasound guided injection.  Independent interpretation of notes and tests performed by another provider:   None.  Brief History, Exam, Impression, and Recommendations:    Right knee degenerative meniscal tear Synvisc No. 3 of 3 right knee, return in 4 to 6 weeks.    ____________________________________________ Gwen Her. Dianah Field, M.D., ABFM., CAQSM., AME. Primary Care and Sports Medicine [place] [facility]  Adjunct Professor of [place] of [facility] of Medicine  Risk manager

## 2022-03-18 NOTE — Assessment & Plan Note (Signed)
Synvisc No. 3 of 3 right knee, return in 4 to 6 weeks.

## 2022-03-18 NOTE — Addendum Note (Signed)
Addended by: Tarri Glenn A on: 03/18/2022 03:43 PM   Modules accepted: Orders

## 2022-03-24 ENCOUNTER — Ambulatory Visit: Payer: BC Managed Care – PPO | Admitting: Sports Medicine

## 2022-03-27 DIAGNOSIS — G4733 Obstructive sleep apnea (adult) (pediatric): Secondary | ICD-10-CM | POA: Diagnosis not present

## 2022-04-26 DIAGNOSIS — G4733 Obstructive sleep apnea (adult) (pediatric): Secondary | ICD-10-CM | POA: Diagnosis not present

## 2022-04-30 ENCOUNTER — Other Ambulatory Visit: Payer: Self-pay | Admitting: Sports Medicine

## 2022-04-30 DIAGNOSIS — M5030 Other cervical disc degeneration, unspecified cervical region: Secondary | ICD-10-CM

## 2022-05-27 DIAGNOSIS — G4733 Obstructive sleep apnea (adult) (pediatric): Secondary | ICD-10-CM | POA: Diagnosis not present

## 2022-07-02 ENCOUNTER — Other Ambulatory Visit: Payer: Self-pay | Admitting: Sports Medicine

## 2022-07-02 DIAGNOSIS — M5030 Other cervical disc degeneration, unspecified cervical region: Secondary | ICD-10-CM

## 2022-07-07 ENCOUNTER — Encounter (INDEPENDENT_AMBULATORY_CARE_PROVIDER_SITE_OTHER): Payer: BC Managed Care – PPO | Admitting: Sports Medicine

## 2022-07-07 DIAGNOSIS — G4733 Obstructive sleep apnea (adult) (pediatric): Secondary | ICD-10-CM

## 2022-07-07 MED ORDER — ROPINIROLE HCL 5 MG PO TABS: 5.0000 mg | ORAL_TABLET | Freq: Every day | ORAL | 3 refills | Status: DC

## 2022-07-07 NOTE — Telephone Encounter (Signed)
I spent 5 total minutes of online digital evaluation and management services in this patient-initiated request for online care. 

## 2022-08-07 ENCOUNTER — Encounter: Payer: Self-pay | Admitting: Sports Medicine

## 2022-08-27 ENCOUNTER — Other Ambulatory Visit: Payer: Self-pay | Admitting: Sports Medicine

## 2022-08-27 DIAGNOSIS — M5030 Other cervical disc degeneration, unspecified cervical region: Secondary | ICD-10-CM

## 2022-09-01 ENCOUNTER — Telehealth (INDEPENDENT_AMBULATORY_CARE_PROVIDER_SITE_OTHER): Payer: BC Managed Care – PPO | Admitting: Sports Medicine

## 2022-09-01 DIAGNOSIS — L309 Dermatitis, unspecified: Secondary | ICD-10-CM

## 2022-09-01 MED ORDER — CLOTRIMAZOLE-BETAMETHASONE 1-0.05 % EX CREA: 1.0000 | TOPICAL_CREAM | Freq: Two times a day (BID) | CUTANEOUS | 0 refills | Status: DC

## 2022-09-01 NOTE — Progress Notes (Signed)
   Virtual Visit via WebEx/MyChart   I connected with  Brandon Riley  on 09/01/22 via WebEx/MyChart/Doximity Video and verified that I am speaking with the correct person using two identifiers.   I discussed the limitations, risks, security and privacy concerns of performing an evaluation and management service by WebEx/MyChart/Doximity Video, including the higher likelihood of inaccurate diagnosis and treatment, and the availability of in person appointments.  We also discussed the likely need of an additional face to face encounter for complete and high quality delivery of care.  I also discussed with the patient that there may be a patient responsible charge related to this service. The patient expressed understanding and wishes to proceed.  Provider location is in medical facility. Patient location is at their home, different from provider location. People involved in care of the patient during this telehealth encounter were myself, my nurse/medical assistant, and my front office/scheduling team member.  Review of Systems: No fevers, chills, night sweats, weight loss, chest pain, or shortness of breath.   Objective Findings:    General: Speaking full sentences, no audible heavy breathing.  Sounds alert and appropriately interactive.  Appears well.  Face symmetric.  Extraocular movements intact.  Pupils equal and round.  No nasal flaring or accessory muscle use visualized.  Independent interpretation of tests performed by another provider:   None.  Brief History, Exam, Impression, and Recommendations:    Dermatitis Worked out in the yard, now has itchy patches arms, legs. No bleeding of the gums, no hematochezia. The lesion on the foot on the camera does appear eczematous but also could potentially be fungal. Adding Lotrisone to use on the arms and foot, return to see me if not better in 2 weeks and will consider lab work.  I discussed the above assessment and treatment plan with the  patient. The patient was provided an opportunity to ask questions and all were answered. The patient agreed with the plan and demonstrated an understanding of the instructions.   The patient was advised to call back or seek an in-person evaluation if the symptoms worsen or if the condition fails to improve as anticipated.   I provided 30 minutes of face to face and non-face-to-face time during this encounter date, time was needed to gather information, review chart, records, communicate/coordinate with staff remotely, as well as complete documentation.   ____________________________________________ Ihor Austin. Benjamin Stain, M.D., ABFM., CAQSM., AME. Primary Care and Sports Medicine [place] [facility]  Adjunct Professor of Family Medicine  [place] [employer] [facility] [employer]  Restaurant manager, fast food

## 2022-09-01 NOTE — Assessment & Plan Note (Signed)
Worked out in the yard, now has itchy patches arms, legs. No bleeding of the gums, no hematochezia. The lesion on the foot on the camera does appear eczematous but also could potentially be fungal. Adding Lotrisone to use on the arms and foot, return to see me if not better in 2 weeks and will consider lab work.

## 2022-09-05 ENCOUNTER — Other Ambulatory Visit: Payer: Self-pay | Admitting: Sports Medicine

## 2022-09-05 DIAGNOSIS — R972 Elevated prostate specific antigen [PSA]: Secondary | ICD-10-CM

## 2022-09-05 DIAGNOSIS — E7800 Pure hypercholesterolemia, unspecified: Secondary | ICD-10-CM

## 2022-09-21 ENCOUNTER — Other Ambulatory Visit: Payer: Self-pay | Admitting: Sports Medicine

## 2022-09-21 DIAGNOSIS — E7800 Pure hypercholesterolemia, unspecified: Secondary | ICD-10-CM

## 2022-09-30 DIAGNOSIS — R7309 Other abnormal glucose: Secondary | ICD-10-CM | POA: Diagnosis not present

## 2022-09-30 DIAGNOSIS — E7800 Pure hypercholesterolemia, unspecified: Secondary | ICD-10-CM | POA: Diagnosis not present

## 2022-09-30 DIAGNOSIS — R972 Elevated prostate specific antigen [PSA]: Secondary | ICD-10-CM | POA: Diagnosis not present

## 2022-10-01 LAB — COMPREHENSIVE METABOLIC PANEL
AG Ratio: 1.6 (calc) (ref 1.0–2.5)
CO2: 23 mmol/L (ref 20–32)
Calcium: 9.8 mg/dL (ref 8.6–10.3)

## 2022-10-01 LAB — HEMOGLOBIN A1C: Mean Plasma Glucose: 126 mg/dL

## 2022-10-01 LAB — LIPID PANEL
HDL: 47 mg/dL (ref >=40)
Non-HDL Cholesterol (Calc): 99 mg/dL (calc) (ref <130)

## 2022-10-02 LAB — CBC
HCT: 45.8 % (ref 38.5–50.0)
Hemoglobin: 15.5 g/dL (ref 13.2–17.1)
MCH: 31.2 pg (ref 27.0–33.0)
MCHC: 33.8 g/dL (ref 32.0–36.0)
MCV: 92.2 fL (ref 80.0–100.0)
MPV: 9.5 fL (ref 7.5–12.5)
Platelets: 189 10*3/uL (ref 140–400)
RBC: 4.97 10*6/uL (ref 4.20–5.80)
RDW: 12.9 % (ref 11.0–15.0)
WBC: 4.4 10*3/uL (ref 3.8–10.8)

## 2022-10-02 LAB — COMPREHENSIVE METABOLIC PANEL
ALT: 30 U/L (ref 9–46)
Alkaline phosphatase (APISO): 70 U/L (ref 35–144)
Creat: 0.94 mg/dL (ref 0.70–1.35)
Potassium: 4.3 mmol/L (ref 3.5–5.3)
Sodium: 137 mmol/L (ref 135–146)
Total Protein: 7.5 g/dL (ref 6.1–8.1)

## 2022-10-02 LAB — COMPREHENSIVE METABOLIC PANEL WITH GFR
AST: 25 U/L (ref 10–35)
Albumin: 4.6 g/dL (ref 3.6–5.1)
BUN: 18 mg/dL (ref 7–25)
Chloride: 103 mmol/L (ref 98–110)
Globulin: 2.9 g/dL (ref 1.9–3.7)
Glucose, Bld: 104 mg/dL — ABNORMAL HIGH (ref 65–99)
Total Bilirubin: 1.5 mg/dL — ABNORMAL HIGH (ref 0.2–1.2)

## 2022-10-02 LAB — HEMOGLOBIN A1C
Hgb A1c MFr Bld: 6 % of total Hgb — ABNORMAL HIGH (ref <5.7)
eAG (mmol/L): 7 mmol/L

## 2022-10-02 LAB — PSA, TOTAL AND FREE
PSA, % Free: 31 % (calc) (ref >25)
PSA, Free: 1.5 ng/mL
PSA, Total: 4.8 ng/mL — ABNORMAL HIGH (ref <=4.0)

## 2022-10-02 LAB — TSH: TSH: 2.13 m[IU]/L (ref 0.40–4.50)

## 2022-10-02 LAB — LIPID PANEL
Cholesterol: 146 mg/dL (ref <200)
LDL Cholesterol (Calc): 81 mg/dL
Total CHOL/HDL Ratio: 3.1 (calc) (ref <5.0)
Triglycerides: 101 mg/dL (ref <150)

## 2022-10-08 ENCOUNTER — Other Ambulatory Visit (INDEPENDENT_AMBULATORY_CARE_PROVIDER_SITE_OTHER): Payer: BC Managed Care – PPO

## 2022-10-08 ENCOUNTER — Encounter: Payer: Self-pay | Admitting: Neurology

## 2022-10-08 ENCOUNTER — Ambulatory Visit: Payer: BC Managed Care – PPO | Admitting: Sports Medicine

## 2022-10-08 DIAGNOSIS — E7800 Pure hypercholesterolemia, unspecified: Secondary | ICD-10-CM

## 2022-10-08 DIAGNOSIS — R972 Elevated prostate specific antigen [PSA]: Secondary | ICD-10-CM

## 2022-10-08 DIAGNOSIS — M5030 Other cervical disc degeneration, unspecified cervical region: Secondary | ICD-10-CM | POA: Diagnosis not present

## 2022-10-08 DIAGNOSIS — G4733 Obstructive sleep apnea (adult) (pediatric): Secondary | ICD-10-CM

## 2022-10-08 DIAGNOSIS — M170 Bilateral primary osteoarthritis of knee: Secondary | ICD-10-CM

## 2022-10-08 DIAGNOSIS — G2581 Restless legs syndrome: Secondary | ICD-10-CM

## 2022-10-08 MED ORDER — ROPINIROLE HCL 4 MG PO TABS
8.0000 mg | ORAL_TABLET | Freq: Every day | ORAL | 11 refills | Status: DC
Start: 2022-10-08 — End: 2023-05-12

## 2022-10-08 MED ORDER — GABAPENTIN 800 MG PO TABS
800.0000 mg | ORAL_TABLET | Freq: Every day | ORAL | 11 refills | Status: DC
Start: 2022-10-08 — End: 2023-03-23

## 2022-10-08 NOTE — Assessment & Plan Note (Signed)
Known cervical DDD, left arm radiculitis mostly nocturnal, we will increase gabapentin to 800 to 1600 mg nightly. If insufficient improvement after 4 to 6 weeks we will consider cervical epidural.

## 2022-10-08 NOTE — Assessment & Plan Note (Signed)
Increasing knee pain, we will do bilateral steroid injections, we will also start Synvisc No. 1 of 3 right knee. Return in 1 week for #2 of 3. I am going to give him some more Synvisc so we can do the series in the left knee as well.

## 2022-10-08 NOTE — Assessment & Plan Note (Signed)
Well-controlled, continue Monday Wednesday Friday dosing.

## 2022-10-08 NOTE — Assessment & Plan Note (Signed)
Continues to improve, we will just watch this yearly.

## 2022-10-08 NOTE — Progress Notes (Signed)
    Procedures performed today:    Procedure: Real-time Ultrasound Guided injection of the right knee Device: Samsung HS60  Verbal informed consent obtained.  Time-out conducted.  Noted no overlying erythema, induration, or other signs of local infection.  Skin prepped in a sterile fashion.  Local anesthesia: Topical Ethyl chloride.  With sterile technique and under real time ultrasound guidance: Noted mild effusion, 1 cc Kenalog 40, 2 cc lidocaine, 2 cc bupivacaine injected easily, syringe switched and 1 syringe of Synvisc injected. Completed without difficulty  Advised to call if fevers/chills, erythema, induration, drainage, or persistent bleeding.  Images permanently stored and available for review in PACS.  Impression: Technically successful ultrasound guided injection.  Procedure: Real-time Ultrasound Guided injection of the left knee Device: Samsung HS60  Verbal informed consent obtained.  Time-out conducted.  Noted no overlying erythema, induration, or other signs of local infection.  Skin prepped in a sterile fashion.  Local anesthesia: Topical Ethyl chloride.  With sterile technique and under real time ultrasound guidance: Mild effusion noted, 1 cc Kenalog 40, 2 cc lidocaine, 2 cc bupivacaine injected easily Completed without difficulty  Advised to call if fevers/chills, erythema, induration, drainage, or persistent bleeding.  Images permanently stored and available for review in PACS.  Impression: Technically successful ultrasound guided injection.  Independent interpretation of notes and tests performed by another provider:   None.  Brief History, Exam, Impression, and Recommendations:    Primary osteoarthritis of both knees with pseudogout Increasing knee pain, we will do bilateral steroid injections, we will also start Synvisc No. 1 of 3 right knee. Return in 1 week for #2 of 3. I am going to give him some more Synvisc so we can do the series in the left knee as  well.  Elevated PSA Continues to improve, we will just watch this yearly.  Degenerative disc disease, cervical Known cervical DDD, left arm radiculitis mostly nocturnal, we will increase gabapentin to 800 to 1600 mg nightly. If insufficient improvement after 4 to 6 weeks we will consider cervical epidural.  Hyperlipidemia Well-controlled, continue Monday Wednesday Friday dosing.  Restless leg syndrome On ropinirole, worsening symptoms, we will increase dose, the increased dose of gabapentin should also help, and I would like consultation with neurology.    ____________________________________________ Brandon Riley. Benjamin Stain, M.D., ABFM., CAQSM., AME. Primary Care and Sports Medicine  [facility]  Adjunct Professor of Family Medicine  [place] [employer] [facility] [employer]  Restaurant manager, fast food

## 2022-10-08 NOTE — Assessment & Plan Note (Addendum)
On ropinirole, worsening symptoms, we will increase dose, the increased dose of gabapentin should also help, and I would like consultation with neurology.

## 2022-10-15 ENCOUNTER — Ambulatory Visit: Payer: BC Managed Care – PPO | Admitting: Sports Medicine

## 2022-10-15 ENCOUNTER — Other Ambulatory Visit (INDEPENDENT_AMBULATORY_CARE_PROVIDER_SITE_OTHER): Payer: BC Managed Care – PPO

## 2022-10-15 DIAGNOSIS — M170 Bilateral primary osteoarthritis of knee: Secondary | ICD-10-CM

## 2022-10-15 MED ORDER — HYLAN G-F 20 16 MG/2ML IX SOSY
16.0000 mg | PREFILLED_SYRINGE | Freq: Once | INTRA_ARTICULAR | Status: AC
Start: 2022-10-15 — End: 2022-10-15
  Administered 2022-10-15: 16 mg via INTRA_ARTICULAR

## 2022-10-15 NOTE — Progress Notes (Addendum)
    Procedures performed today:    Procedure: Real-time Ultrasound Guided injection of the right knee Device: Samsung HS60  Verbal informed consent obtained.  Time-out conducted.  Noted no overlying erythema, induration, or other signs of local infection.  Skin prepped in a sterile fashion.  Local anesthesia: Topical Ethyl chloride.  With sterile technique and under real time ultrasound guidance: Noted trace effusion 1 syringe of Synvisc injected. Completed without difficulty  Advised to call if fevers/chills, erythema, induration, drainage, or persistent bleeding.  Images permanently stored and available for review in PACS.  Impression: Technically successful ultrasound guided injection.   Procedure: Real-time Ultrasound Guided injection of the left knee Device: Samsung HS60  Verbal informed consent obtained.  Time-out conducted.  Noted no overlying erythema, induration, or other signs of local infection.  Skin prepped in a sterile fashion.  Local anesthesia: Topical Ethyl chloride.  With sterile technique and under real time ultrasound guidance: Noted trace effusion 1 syringe of Synvisc injected. Completed without difficulty  Advised to call if fevers/chills, erythema, induration, drainage, or persistent bleeding.  Images permanently stored and available for review in PACS.  Impression: Technically successful ultrasound guided injection.  Independent interpretation of notes and tests performed by another provider:   None.  Brief History, Exam, Impression, and Recommendations:    Primary osteoarthritis of both knees with pseudogout Brandon Riley returns, he is a very pleasant 62 year old male with bilateral knee osteoarthritis, we gave him a steroid shot in both knees at the last visit, we also did Synvisc No. 1 in the right knee at the last visit, today we did Synvisc No. 2 of 3 in the right knee and #1 of 3 in the left knee. Return in 1 week for Synvisc No. 3 of 3 in the right knee  and Synvisc No. 2 of 3 in the left knee, we will be ordering another box.    ____________________________________________ Brandon Riley. Benjamin Stain, M.D., ABFM., CAQSM., AME. Primary Care and Sports Medicine [place] [facility]  Adjunct Professor of Family Medicine  [place] [employer] [facility] [employer]  Restaurant manager, fast food

## 2022-10-15 NOTE — Assessment & Plan Note (Addendum)
Brandon Riley returns, he is a very pleasant 62 year old male with bilateral knee osteoarthritis, we gave him a steroid shot in both knees at the last visit, we also did Synvisc No. 1 in the right knee at the last visit, today we did Synvisc No. 2 of 3 in the right knee and #1 of 3 in the left knee. Return in 1 week for Synvisc No. 3 of 3 in the right knee and Synvisc No. 2 of 3 in the left knee, we will be ordering another box.

## 2022-10-15 NOTE — Addendum Note (Signed)
Addended by: Carren Rang A on: 10/15/2022 11:43 AM   Modules accepted: Orders

## 2022-10-23 ENCOUNTER — Ambulatory Visit: Payer: BC Managed Care – PPO | Admitting: Sports Medicine

## 2022-10-23 ENCOUNTER — Other Ambulatory Visit (INDEPENDENT_AMBULATORY_CARE_PROVIDER_SITE_OTHER): Payer: BC Managed Care – PPO

## 2022-10-23 DIAGNOSIS — M170 Bilateral primary osteoarthritis of knee: Secondary | ICD-10-CM

## 2022-10-23 NOTE — Progress Notes (Signed)
    Procedures performed today:    Procedure: Real-time Ultrasound Guided injection of the right knee Device: Samsung HS60  Verbal informed consent obtained.  Time-out conducted.  Noted no overlying erythema, induration, or other signs of local infection.  Skin prepped in a sterile fashion.  Local anesthesia: Topical Ethyl chloride.  With sterile technique and under real time ultrasound guidance: Noted trace effusion 1 syringe of Synvisc injected. Completed without difficulty  Advised to call if fevers/chills, erythema, induration, drainage, or persistent bleeding.  Images permanently stored and available for review in PACS.  Impression: Technically successful ultrasound guided injection.   Procedure: Real-time Ultrasound Guided injection of the left knee Device: Samsung HS60  Verbal informed consent obtained.  Time-out conducted.  Noted no overlying erythema, induration, or other signs of local infection.  Skin prepped in a sterile fashion.  Local anesthesia: Topical Ethyl chloride.  With sterile technique and under real time ultrasound guidance: Noted trace effusion 1 syringe of Synvisc injected. Completed without difficulty  Advised to call if fevers/chills, erythema, induration, drainage, or persistent bleeding.  Images permanently stored and available for review in PACS.  Impression: Technically successful ultrasound guided injection.  Independent interpretation of notes and tests performed by another provider:   None.  Brief History, Exam, Impression, and Recommendations:    Primary osteoarthritis of both knees with pseudogout Synvisc 3 of 3 right knee, 2 of 3 left knee, return in 1 week for #3 of 3 left knee.    ____________________________________________ Brandon Riley. Benjamin Stain, M.D., ABFM., CAQSM., AME. Primary Care and Sports Medicine [place] [facility]  Adjunct Professor of Family Medicine  [place] [employer] [facility] [employer]  [employer]  Restaurant manager, fast food

## 2022-10-23 NOTE — Assessment & Plan Note (Signed)
Synvisc 3 of 3 right knee, 2 of 3 left knee, return in 1 week for #3 of 3 left knee.

## 2022-10-23 NOTE — Addendum Note (Signed)
Addended by: Carren Rang A on: 10/23/2022 08:59 AM   Modules accepted: Orders

## 2022-10-24 ENCOUNTER — Other Ambulatory Visit: Payer: Self-pay | Admitting: Sports Medicine

## 2022-10-24 DIAGNOSIS — M5030 Other cervical disc degeneration, unspecified cervical region: Secondary | ICD-10-CM

## 2022-10-31 ENCOUNTER — Ambulatory Visit: Payer: BC Managed Care – PPO | Admitting: Sports Medicine

## 2022-10-31 ENCOUNTER — Other Ambulatory Visit (INDEPENDENT_AMBULATORY_CARE_PROVIDER_SITE_OTHER): Payer: BC Managed Care – PPO

## 2022-10-31 DIAGNOSIS — M170 Bilateral primary osteoarthritis of knee: Secondary | ICD-10-CM

## 2022-10-31 NOTE — Addendum Note (Signed)
Addended by: Carren Rang A on: 10/31/2022 02:14 PM   Modules accepted: Orders

## 2022-10-31 NOTE — Assessment & Plan Note (Signed)
Synvisc 3 of 3 left knee, return in 6 weeks as needed.

## 2022-10-31 NOTE — Progress Notes (Signed)
[id number] 

## 2022-10-31 NOTE — Progress Notes (Signed)
    Procedures performed today:    Procedure: Real-time Ultrasound Guided injection of the left knee Device: Samsung HS60  Verbal informed consent obtained.  Time-out conducted.  Noted no overlying erythema, induration, or other signs of local infection.  Skin prepped in a sterile fashion.  Local anesthesia: Topical Ethyl chloride.  With sterile technique and under real time ultrasound guidance: Noted trace effusion, 1 syringe of Synvisc injected into the suprapatellar recess with a 22-gauge needle.   Completed without difficulty  Advised to call if fevers/chills, erythema, induration, drainage, or persistent bleeding.  Images permanently stored and available for review in PACS.  Impression: Technically successful ultrasound guided injection.  Independent interpretation of notes and tests performed by another provider:   None.  Brief History, Exam, Impression, and Recommendations:    Primary osteoarthritis of both knees with pseudogout Synvisc 3 of 3 left knee, return in 6 weeks as needed.    ____________________________________________ Ihor Austin. Benjamin Stain, M.D., ABFM., CAQSM., AME. Primary Care and Sports Medicine [place] [facility]  Adjunct Professor of Family Medicine  [place] [employer] [facility] [employer]  Restaurant manager, fast food

## 2022-11-10 DIAGNOSIS — Z1211 Encounter for screening for malignant neoplasm of colon: Secondary | ICD-10-CM | POA: Diagnosis not present

## 2022-11-10 DIAGNOSIS — D122 Benign neoplasm of ascending colon: Secondary | ICD-10-CM | POA: Diagnosis not present

## 2022-11-10 DIAGNOSIS — F419 Anxiety disorder, unspecified: Secondary | ICD-10-CM | POA: Diagnosis not present

## 2022-11-10 DIAGNOSIS — Z09 Encounter for follow-up examination after completed treatment for conditions other than malignant neoplasm: Secondary | ICD-10-CM | POA: Diagnosis not present

## 2022-11-10 DIAGNOSIS — Z86010 Personal history of colonic polyps: Secondary | ICD-10-CM | POA: Diagnosis not present

## 2022-11-17 ENCOUNTER — Ambulatory Visit (INDEPENDENT_AMBULATORY_CARE_PROVIDER_SITE_OTHER): Payer: BC Managed Care – PPO | Admitting: Neurology

## 2022-11-17 ENCOUNTER — Encounter: Payer: Self-pay | Admitting: Neurology

## 2022-11-17 VITALS — BP 133/81 | HR 73 | Ht 67.0 in | Wt 239.0 lb

## 2022-11-17 DIAGNOSIS — G2581 Restless legs syndrome: Secondary | ICD-10-CM

## 2022-11-17 DIAGNOSIS — T887XXA Unspecified adverse effect of drug or medicament, initial encounter: Secondary | ICD-10-CM | POA: Diagnosis not present

## 2022-11-17 MED ORDER — ROPINIROLE HCL 1 MG PO TABS: ORAL_TABLET | ORAL | 3 refills | Status: DC

## 2022-11-17 NOTE — Progress Notes (Signed)
[facility] Neu[facility] Clinic Note - Initial Visit   Date: 11/17/2022   Brandon Riley MRN: [id number] DOB: [birth year]   Dear Dr. Benjamin Stain:  Thank you for your kind referral of Brandon Riley for consultation of restless leg syndrome. Although his history is well known to you, please allow [place] to reiterate it for the purpose of our medical record. The patient was accompanied to the clinic by wife who also provides collateral information.     Brandon Riley is a 62 y.o. right-handed male with OSA, hyperlipidemia, and GERD presenting for evaluation of restless leg syndrome.   IMPRESSION/PLAN: Restless leg syndrome with side effects to medication (augmentation).  He has been on ropinirole for a number of years and despite increasing the dose, symptoms are getting worse over the last 6 months.  His history is most consistent of augmentation to dopamine agonist therapy, which can be seen with longterm therapy.  He is currently taking ropinirole 8mg  at bedtime and gabapentin 800mg  at bedtime.   I recommend that we slowly taper him off ropinirole by reducing the dose by 1mg  each week and optimize gabapentin.  He can take gabapentin 1200mg  at bedtime, this can be titrated as needed.   Return to clinic in 3 months  ------------------------------------------------------------- History of present illness: For the past 10 years, he has restless leg syndrome and was on ropinirole 2mg  at bedtime for a few years. Starting earlier in 2024, he began having worsening symptoms, so steadily increased ropinirole to where he is taking 8mg  at bedtime.    He was taking gabapentin 600mg  at bedtime, but given his worsening symptoms he is now taking gabapentin 800mg  at bedtime.  He sometimes as adjusts the dose to gabapentin 800mg  + 300mg  based on how he is feeling.   He denies weakness, imbalance, falls, or numbness/tingling in the feet.   He endorses chronic low back pain.   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     Out-side paper records, electronic medical record, and images have been reviewed where available and summarized as:  Lab Results  Component Value Date   HGBA1C 6.0 (H) 09/30/2022   Lab Results  Component Value Date   TSH 2.13 09/30/2022   Lab Results  Component Value Date   ESRSEDRATE 6 09/06/2020    Past Medical History:  Diagnosis Date   GERD (gastroesophageal reflux disease)    Hyperlipemia    Restless leg syndrome    Sleep apnea    w cpap    Past Surgical History:  Procedure Laterality Date   CHONDROPLASTY Left 10/28/2018   Procedure: CHONDROPLASTY;  Surgeon: Bjorn Pippin, MD;  Location: [facility];  Service: Orthopedics;  Laterality: Left;   KNEE ARTHROSCOPY WITH MEDIAL MENISECTOMY Left 10/28/2018   Procedure: LEFT KNEE ARTHROSCOPY WITH MEDIAL MENISECTOMY;  Surgeon: Bjorn Pippin, MD;  Location: [facility];  Service: Orthopedics;  Laterality: Left;   RECONSTRUCTION OF NOSE  2019   WISDOM TOOTH EXTRACTION  2012     Medications:  Outpatient Encounter Medications as of 11/17/2022  Medication Sig   Ascorbic Acid (VITAMIN C) 100 MG tablet Take 100 mg by mouth daily.   Cholecalciferol (VITAMIN D3) 25 MCG (1000 UT) CAPS    gabapentin (NEURONTIN) 800 MG tablet Take 1-2 tablets (800-1,600 mg total) by mouth at bedtime.   Potassium Gluconate 550 (90 K) MG TABS    ropinirole (REQUIP) 4 MG tablet Take 2  tablets (8 mg total) by mouth at bedtime.   rosuvastatin (CRESTOR) 10 MG tablet TAKE ONE TABLET BY MOUTH DAILY   No facility-administered encounter medications on file as of 11/17/2022.    Allergies:  Allergies  Allergen Reactions   Celebrex [Celecoxib] Palpitations    Heavy breathing   Flexeril [Cyclobenzaprine] Hives    Family History: Family History  Problem Relation Age of Onset   Dementia Mother    Heart Problems Mother        Double bypass   Diabetes Mother    Other Father         Prostate Issues    Social History: Social History   Tobacco Use   Smoking status: Never   Smokeless tobacco: Never  Vaping Use   Vaping status: Never Used  Substance Use Topics   Alcohol use: Yes    Comment: 2-3x per week   Drug use: Never   Social History   Social History Narrative   Are you right handed or left handed?  Right Handed   Are you currently employed ?Retired   What is your current occupation?   Do you live at home alone? No   Who lives with you? Wife and animals   What type of home do you live in: 1 story or 2 story? One story with a basement.        Vital Signs:  BP 133/81   Pulse 73   Ht 5\' 7"  (1.702 m)   Wt 239 lb (108.4 kg)   SpO2 97%   BMI 37.43 kg/m   Neurological Exam: MENTAL STATUS including orientation to time, place, person, recent and remote memory, attention span and concentration, language, and fund of knowledge is normal.  Speech is not dysarthric.  CRANIAL NERVES: II:  No visual field defects.     III-IV-VI: Pupils equal round and reactive to light.  Normal conjugate, extra-ocular eye movements in all directions of gaze.  No nystagmus.  No ptosis.   V:  Normal facial sensation.    VII:  Normal facial symmetry and movements.   VIII:  Normal hearing and vestibular function.   IX-X:  Normal palatal movement.   XI:  Normal shoulder shrug and head rotation.   XII:  Normal tongue strength and range of motion, no deviation or fasciculation.  MOTOR:  No atrophy, fasciculations or abnormal movements.  No pronator drift.   Upper Extremity:  Right  Left  Deltoid  5/5   5/5   Biceps  5/5   5/5   Triceps  5/5   5/5   Wrist extensors  5/5   5/5   Wrist flexors  5/5   5/5   Finger extensors  5/5   5/5   Finger flexors  5/5   5/5   Dorsal interossei  5/5   5/5   Abductor pollicis  5/5   5/5   Tone (Ashworth scale)  0  0   Lower Extremity:  Right  Left  Hip flexors  5/5   5/5   Hip extensors  5/5   5/5   Knee flexors  5/5   5/5   Knee  extensors  5/5   5/5   Dorsiflexors  5/5   5/5   Plantarflexors  5/5   5/5   Toe extensors  5/5   5/5   Toe flexors  5/5   5/5   Tone (Ashworth scale)  0  0   MSRs:  Right        Left brachioradialis 2+  2+  biceps 2+  2+  triceps 2+  2+  patellar 2+  2+  ankle jerk 2+  2+  Hoffman no  no  plantar response down  down   SENSORY:  Normal and symmetric perception of light touch, vibration, and temperature.    COORDINATION/GAIT: Normal finger-to- nose-finger.  Intact rapid alternating movements bilaterally.  Gait narrow based and stable. Tandem and stressed gait intact.     Thank you for allowing me to participate in patient's care.  If I can answer any additional questions, I would be pleased to do so.    Sincerely,    Amron Guerrette K. Allena Katz, DO

## 2022-11-17 NOTE — Patient Instructions (Signed)
Reduce ropinirole by 1mg  each week  If your symptoms get worse, you can take gabapentin 800mg  + 400mg  (half-tablets) at bedtime  I will see you back in 3 months

## 2023-01-09 ENCOUNTER — Telehealth: Payer: Self-pay | Admitting: Sports Medicine

## 2023-01-09 ENCOUNTER — Telehealth (INDEPENDENT_AMBULATORY_CARE_PROVIDER_SITE_OTHER): Payer: BC Managed Care – PPO

## 2023-01-09 DIAGNOSIS — Z23 Encounter for immunization: Secondary | ICD-10-CM | POA: Diagnosis not present

## 2023-01-09 NOTE — Telephone Encounter (Signed)
Brandon Riley brought up his neck during his wife's appointment, he is having increasing neck pain with left-sided radicular symptoms worse at night, he will increase his gabapentin from 800 mg at night to 800 at night and 400 in the morning, I would like to see him back in maybe 4 to 6 weeks and we can consider interventional treatment if not better.

## 2023-01-09 NOTE — Telephone Encounter (Signed)
Flu shot

## 2023-02-19 ENCOUNTER — Ambulatory Visit (INDEPENDENT_AMBULATORY_CARE_PROVIDER_SITE_OTHER): Payer: BC Managed Care – PPO | Admitting: Sports Medicine

## 2023-02-19 ENCOUNTER — Encounter: Payer: Self-pay | Admitting: Sports Medicine

## 2023-02-19 DIAGNOSIS — M5030 Other cervical disc degeneration, unspecified cervical region: Secondary | ICD-10-CM | POA: Diagnosis not present

## 2023-02-19 MED ORDER — OXYCODONE-ACETAMINOPHEN 10-325 MG PO TABS
1.0000 | ORAL_TABLET | Freq: Three times a day (TID) | ORAL | 0 refills | Status: DC | PRN
Start: 2023-02-19 — End: 2023-05-12

## 2023-02-19 MED ORDER — NAPROXEN SODIUM 220 MG PO TABS
440.0000 mg | ORAL_TABLET | Freq: Two times a day (BID) | ORAL | Status: DC | PRN
Start: 2023-02-19 — End: 2023-07-21

## 2023-02-19 NOTE — Assessment & Plan Note (Addendum)
Pleasant 62 year old male with known cervical radiculitis left-sided, cervical DDD, historically controlled with gabapentin, he was using 1600 mg nightly, unfortunately none of this is effective.  He plans to ramp down or discontinue the gabapentin. He has done his home physical therapy. He uses over-the-counter Aleve 2 pills twice a day. As he is continuing to have discomfort for greater than 6 weeks in spite of conservative treatment we will proceed with cervical spine MRI, I will also go ahead and order the epidural as soon as we see the results. Adding a short course of Percocet.

## 2023-02-19 NOTE — Progress Notes (Signed)
    Procedures performed today:    None.  Independent interpretation of notes and tests performed by another provider:   None.  Brief History, Exam, Impression, and Recommendations:    Degenerative disc disease, cervical Pleasant 62 year old male with known cervical radiculitis left-sided, cervical DDD, historically controlled with gabapentin, he was using 1600 mg nightly, unfortunately none of this is effective.  He plans to ramp down or discontinue the gabapentin. He has done his home physical therapy. He uses over-the-counter Aleve 2 pills twice a day. As he is continuing to have discomfort for greater than 6 weeks in spite of conservative treatment we will proceed with cervical spine MRI, I will also go ahead and order the epidural as soon as we see the results. Adding a short course of Percocet.  Chronic process with exacerbation and pharmacologic intervention  ____________________________________________ Ihor Austin. Benjamin Stain, M.D., ABFM., CAQSM., AME. Primary Care and Sports Medicine [place] [facility]  Adjunct Professor of Family Medicine  [place] [employer] [facility] [employer]  Restaurant manager, fast food

## 2023-02-24 ENCOUNTER — Ambulatory Visit: Payer: BC Managed Care – PPO | Admitting: Neurology

## 2023-02-24 ENCOUNTER — Encounter: Payer: Self-pay | Admitting: Neurology

## 2023-02-24 VITALS — BP 125/78 | HR 79 | Ht 68.0 in | Wt 247.0 lb

## 2023-02-24 DIAGNOSIS — G2581 Restless legs syndrome: Secondary | ICD-10-CM | POA: Diagnosis not present

## 2023-02-24 DIAGNOSIS — M25562 Pain in left knee: Secondary | ICD-10-CM | POA: Diagnosis not present

## 2023-02-24 NOTE — Progress Notes (Signed)
Follow-up Visit   Date: 02/24/2023    Brandon Riley MRN: [id number] DOB: [birth year]    Brandon Riley is a 62 y.o. right-handed Caucasian male with OSA, hyperlipidemia, and GERD returning to the clinic for follow-up of restless leg syndrome.  The patient was accompanied to the clinic by wife who also provides collateral information.    IMPRESSION/PLAN: Restless leg syndrome, improved after tapering ropironole (augmentation)  - He has been able to reduce ropinirole to 3mg  daily and has been doing better, which he may continue at this dose  - OK to continue gabapentin 1200mg  at bedtime.  He would like to reduce the dose and I suggested that he does this by making 400mg  reduction every 2 weeks when he is ready.    Return to clinic as needed  --------------------------------------------- History of present illness: For the past 10 years, he has restless leg syndrome and was on ropinirole 2mg  at bedtime for a few years. Starting earlier in 2024, he began having worsening symptoms, so steadily increased ropinirole to where he is taking 8mg  at bedtime.     He was taking gabapentin 600mg  at bedtime, but given his worsening symptoms he is now taking gabapentin 800mg  at bedtime.  He sometimes as adjusts the dose to gabapentin 800mg  + 300mg  based on how he is feeling.    He denies weakness, imbalance, falls, or numbness/tingling in the feet.   He endorses chronic low back pain.    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    UPDATE 02/24/2023:  He is here for follow-up visit.  He was able to reduce ropinirole by 1mg  every week which has helped his restless leg sensation.  When he lowered it to ropinirole 2.5mg , he began noticing that RLS was getting worse, so he remains on 3mg  at bedtime.  He continues to take gabapentin 1200mg  at bedtime.  This combination has controlled his RLS well and he is no longer having difficulty with sleeping at night.   Medications:   Current Outpatient Medications on File Prior to Visit  Medication Sig Dispense Refill   Ascorbic Acid (VITAMIN C) 100 MG tablet Take 100 mg by mouth daily.     Cholecalciferol (VITAMIN D3) 25 MCG (1000 UT) CAPS      gabapentin (NEURONTIN) 800 MG tablet Take 1-2 tablets (800-1,600 mg total) by mouth at bedtime. 60 tablet 11   naproxen sodium (ALEVE) 220 MG tablet Take 2 tablets (440 mg total) by mouth 2 (two) times daily as needed.     oxyCODONE-acetaminophen (PERCOCET) 10-325 MG tablet Take 1 tablet by mouth every 8 (eight) hours as needed for pain. 15 tablet 0   Potassium Gluconate 550 (90 K) MG TABS      rOPINIRole (REQUIP) 1 MG tablet Take 1-2 tablets with ropinirole 4mg  tablets. (Patient taking differently: Take 3 tablets at bedtime) 60 tablet 3   rosuvastatin (CRESTOR) 10 MG tablet TAKE ONE TABLET BY MOUTH DAILY 90 tablet 2   ropinirole (REQUIP) 4 MG tablet Take 2 tablets (8 mg total) by mouth at bedtime. (Patient not taking: Reported on 02/24/2023) 60 tablet 11   No current facility-administered medications on file prior to visit.    Allergies:  Allergies  Allergen Reactions   Celebrex [Celecoxib] Palpitations    Heavy breathing   Flexeril [Cyclobenzaprine] Hives    Vital Signs:  BP 125/78   Pulse 79   Ht 5\' 8"  (1.727 m)   Wt 247 lb (  112 kg)   SpO2 98%   BMI 37.56 kg/m   Neurological Exam: MENTAL STATUS including orientation to time, place, person, recent and remote memory, attention span and concentration, language, and fund of knowledge is normal.  Speech is not dysarthric.  CRANIAL NERVES:   Pupils equal round and reactive to light.  Normal conjugate, extra-ocular eye movements in all directions of gaze.  No ptosis.  Face is symmetric.   MOTOR:  Motor strength is 5/5 in all extremities.  No atrophy, fasciculations or abnormal movements.  No pronator drift.  Tone is normal.    COORDINATION/GAIT:  Gait is slightly antalgic due to bilateral knee pain, stable,  unassisted.  Data:n/a   Thank you for allowing me to participate in patient's care.  If I can answer any additional questions, I would be pleased to do so.    Sincerely,    Madelynn Malson K. Allena Katz, DO

## 2023-02-28 ENCOUNTER — Ambulatory Visit: Payer: BC Managed Care – PPO

## 2023-02-28 DIAGNOSIS — M5412 Radiculopathy, cervical region: Secondary | ICD-10-CM | POA: Diagnosis not present

## 2023-02-28 DIAGNOSIS — M4802 Spinal stenosis, cervical region: Secondary | ICD-10-CM | POA: Diagnosis not present

## 2023-02-28 DIAGNOSIS — M5030 Other cervical disc degeneration, unspecified cervical region: Secondary | ICD-10-CM

## 2023-03-05 ENCOUNTER — Encounter: Payer: Self-pay | Admitting: Sports Medicine

## 2023-03-23 ENCOUNTER — Telehealth: Payer: Self-pay

## 2023-03-23 ENCOUNTER — Ambulatory Visit
Admission: EM | Admit: 2023-03-23 | Discharge: 2023-03-23 | Disposition: A | Discharge status: Home / Self Care | Payer: BC Managed Care – PPO | Attending: Family Medicine | Admitting: Family Medicine

## 2023-03-23 DIAGNOSIS — R059 Cough, unspecified: Secondary | ICD-10-CM

## 2023-03-23 DIAGNOSIS — J0100 Acute maxillary sinusitis, unspecified: Secondary | ICD-10-CM | POA: Diagnosis not present

## 2023-03-23 LAB — POCT INFLUENZA A/B
Influenza A, POC: NEGATIVE
Influenza B, POC: NEGATIVE

## 2023-03-23 MED ORDER — PROMETHAZINE-DM 6.25-15 MG/5ML PO SYRP: 5.0000 mL | ORAL_SOLUTION | Freq: Two times a day (BID) | ORAL | 0 refills | Status: DC | PRN

## 2023-03-23 MED ORDER — AMOXICILLIN-POT CLAVULANATE 875-125 MG PO TABS: 1.0000 | ORAL_TABLET | Freq: Two times a day (BID) | ORAL | 0 refills | Status: DC

## 2023-03-23 MED ORDER — PREDNISONE 20 MG PO TABS: ORAL_TABLET | ORAL | 0 refills | Status: DC

## 2023-03-23 MED ORDER — BENZONATATE 200 MG PO CAPS: 200.0000 mg | ORAL_CAPSULE | Freq: Three times a day (TID) | ORAL | 0 refills | Status: AC | PRN

## 2023-03-23 NOTE — Telephone Encounter (Signed)
Please call patient for scheduling. Thanks in advance.

## 2023-03-23 NOTE — Telephone Encounter (Signed)
Copied from CRM [id number]. Topic: General - Other >> Mar 23, 2023  8:01 AM Deaijah H wrote: Reason for CRM: Patient wife called in due to husband being sick would like to be seen today but no same day appointment / Stated video call is fine is able to be seen / callback # [phone]

## 2023-03-23 NOTE — ED Provider Notes (Signed)
[facility]    CSN: [id number] Arrival date & time: 03/23/23  1107      History   Chief Complaint Chief Complaint  Patient presents with   Cough    HPI Brandon Riley is a 62 y.o. male.   HPI 61 year old male presents with cough and congestion for 1 week worsening over this past weekend.  Patient denies sick contacts and reports negative home COVID-19 test.  PMH significant for morbid obesity, OSA, and HLD.  Patient is accompanied by his wife today.  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   Obstructive sleep apnea on BiPAP with periodic limb movement disorder 06/05/2014   Obesity 05/02/2013   Annual physical exam 04/04/2013   Primary osteoarthritis of both knees with pseudogout 04/04/2013   Degenerative disc disease, cervical 04/04/2013   Hyperlipidemia 04/04/2013   Lumbar degenerative disc disease 04/04/2013    Past Surgical History:  Procedure Laterality Date   CHONDROPLASTY Left 10/28/2018   Procedure: CHONDROPLASTY;  Surgeon: Bjorn Pippin, MD;  Location: [facility];  Service: Orthopedics;  Laterality: Left;   KNEE ARTHROSCOPY WITH MEDIAL MENISECTOMY Left 10/28/2018   Procedure: LEFT KNEE ARTHROSCOPY WITH MEDIAL MENISECTOMY;  Surgeon: Bjorn Pippin, MD;  Location: [facility];  Service: Orthopedics;  Laterality: Left;   RECONSTRUCTION OF NOSE  2019   WISDOM  TOOTH EXTRACTION  2012       Home Medications    Prior to Admission medications   Medication Sig Start Date End Date Taking? Authorizing Provider  amoxicillin-clavulanate (AUGMENTIN) 875-125 MG tablet Take 1 tablet by mouth every 12 (twelve) hours. 03/23/23  Yes Trevor Iha, FNP  benzonatate (TESSALON) 200 MG capsule Take 1 capsule (200 mg total) by mouth 3 (three) times daily as needed for up to 7 days. 03/23/23 03/30/23 Yes Trevor Iha, FNP  oxyCODONE-acetaminophen (PERCOCET) 10-325 MG tablet Take 1 tablet by mouth every 8 (eight) hours as needed for pain. 02/19/23  Yes Monica Becton, MD  predniSONE (DELTASONE) 20 MG tablet Take 3 tabs PO daily x 5 days. 03/23/23  Yes Trevor Iha, FNP  promethazine-dextromethorphan (PROMETHAZINE-DM) 6.25-15 MG/5ML syrup Take 5 mLs by mouth 2 (two) times daily as needed for cough. 03/23/23  Yes Trevor Iha, FNP  rOPINIRole (REQUIP) 1 MG tablet Take 1-2 tablets with ropinirole 4mg  tablets. Patient taking differently: Take 3 tablets at bedtime 11/17/22  Yes Patel, Donika K, DO  ropinirole (REQUIP) 4 MG tablet Take 2 tablets (8 mg total) by mouth at bedtime. 10/08/22  Yes Monica Becton, MD  Ascorbic Acid (VITAMIN C) 100 MG tablet Take 100 mg by mouth daily.    [provider]  Cholecalciferol (VITAMIN D3) 25 MCG (1000 UT) CAPS  01/23/20   [provider]  naproxen sodium (ALEVE) 220 MG tablet Take 2 tablets (440 mg total) by mouth 2 (two) times daily as needed. 02/19/23  Monica Becton, MD  Potassium Gluconate 550 (90 K) MG TABS  01/23/20   [provider]  rosuvastatin (CRESTOR) 10 MG tablet TAKE ONE TABLET BY MOUTH DAILY 09/21/22   Monica Becton, MD    Family History Family History  Problem Relation Age of Onset   Dementia Mother    Heart Problems Mother        Double bypass   Diabetes Mother    Other Father        Prostate Issues    Social History Social History   Tobacco Use    Smoking status: Never   Smokeless tobacco: Never  Vaping Use   Vaping status: Never Used  Substance Use Topics   Alcohol use: Yes    Comment: 2-3x per week   Drug use: Never     Allergies   Celebrex [celecoxib] and Flexeril [cyclobenzaprine]   Review of Systems Review of Systems  HENT:  Positive for congestion and sinus pressure.   Respiratory:  Positive for cough.   All other systems reviewed and are negative.    Physical Exam Triage Vital Signs ED Triage Vitals  Encounter Vitals Group     BP 03/23/23 1156 (!) 149/94     Systolic BP Percentile --      Diastolic BP Percentile --      Pulse Rate 03/23/23 1156 82     Resp 03/23/23 1156 16     Temp 03/23/23 1156 97.9 F (36.6 C)     Temp src --      SpO2 03/23/23 1156 98 %     Weight --      Height --      Head Circumference --      Peak Flow --      Pain Score 03/23/23 1153 5     Pain Loc --      Pain Education --      Exclude from Growth Chart --    No data found.  Updated Vital Signs BP (!) 149/94   Pulse 82   Temp 97.9 F (36.6 C)   Resp 16   SpO2 98%    Physical Exam Vitals and nursing note reviewed.  Constitutional:      General: He is not in acute distress.    Appearance: Normal appearance. He is obese. He is ill-appearing.  HENT:     Head: Normocephalic and atraumatic.     Right Ear: Tympanic membrane and external ear normal.     Left Ear: Tympanic membrane and external ear normal.     Ears:     Comments: Significant eustachian tube dysfunction noted bilaterally    Nose:     Comments: Turbinates are erythematous/edematous    Mouth/Throat:     Mouth: Mucous membranes are moist.     Pharynx: Oropharynx is clear.  Eyes:     Extraocular Movements: Extraocular movements intact.     Conjunctiva/sclera: Conjunctivae normal.     Pupils: Pupils are equal, round, and reactive to light.  Cardiovascular:     Rate and Rhythm: Normal rate and regular rhythm.     Pulses: Normal pulses.     Heart  sounds: Normal heart sounds.  Pulmonary:     Effort: Pulmonary effort is normal.     Breath sounds: Normal breath sounds. No wheezing, rhonchi or rales.     Comments: Frequent nonproductive cough noted on exam Musculoskeletal:        General: Normal range of motion.  Cervical back: Normal range of motion and neck supple.  Skin:    General: Skin is warm and dry.  Neurological:     General: No focal deficit present.     Mental Status: He is alert and oriented to person, place, and time. Mental status is at baseline.  Psychiatric:        Mood and Affect: Mood normal.        Behavior: Behavior normal.      UC Treatments / Results  Labs (all labs ordered are listed, but only abnormal results are displayed) Labs Reviewed  POCT INFLUENZA A/B    EKG   Radiology No results found.  Procedures Procedures (including critical care time)  Medications Ordered in UC Medications - No data to display  Initial Impression / Assessment and Plan / UC Course  I have reviewed the triage vital signs and the nursing notes.  Pertinent labs & imaging results that were available during my care of the patient were reviewed by me and considered in my medical decision making (see chart for details).     MDM: 1.  Acute maxillary sinusitis, recurrence not specified-Rx'd Augmentin 875/125 mg tablet: Take 1 tablet twice daily x 7 days; 2.  Cough, unspecified type-Rx'd prednisone 20 mg tablet: Take 3 tabs p.o. daily x 5 days, Rx'd Tessalon 200 mg capsules: Take 1 capsule 3 times daily, as needed for cough, Rx'd Promethazine DM 6.25-15 mg/5 mL syrup: Take 5 mL twice daily as needed for cough. Advised patient to take medications as directed with food to completion.  Advised patient to take prednisone with first dose of Augmentin for the next 5 of 7 days.  Advised may take Tessalon capsules daily or as needed for cough.  Advised may use Promethazine DM at night for cough prior to sleep due to sedative  effects.  Encouraged increase daily water intake to 64 ounces per day while taking these medications.  Advised if symptoms worsen and/or unresolved please follow-up with PCP or here for further evaluation.  Discharged home, hemodynamically stable. Final Clinical Impressions(s) / UC Diagnoses   Final diagnoses:  Acute maxillary sinusitis, recurrence not specified  Cough, unspecified type     Discharge Instructions      Advised patient to take medications as directed with food to completion.  Advised patient to take prednisone with first dose of Augmentin for the next 5 of 7 days.  Advised may take Tessalon capsules daily or as needed for cough.  Advised may use Promethazine DM at night for cough prior to sleep due to sedative effects.  Encouraged increase daily water intake to 64 ounces per day while taking these medications.  Advised if symptoms worsen and/or unresolved please follow-up with PCP or here for further evaluation.     ED Prescriptions     Medication Sig Dispense Auth. Provider   amoxicillin-clavulanate (AUGMENTIN) 875-125 MG tablet Take 1 tablet by mouth every 12 (twelve) hours. 14 tablet Trevor Iha, FNP   predniSONE (DELTASONE) 20 MG tablet Take 3 tabs PO daily x 5 days. 15 tablet Trevor Iha, FNP   benzonatate (TESSALON) 200 MG capsule Take 1 capsule (200 mg total) by mouth 3 (three) times daily as needed for up to 7 days. 40 capsule Trevor Iha, FNP   promethazine-dextromethorphan (PROMETHAZINE-DM) 6.25-15 MG/5ML syrup Take 5 mLs by mouth 2 (two) times daily as needed for cough. 118 mL Trevor Iha, FNP      PDMP not reviewed this encounter.   Trevor Iha, FNP  03/23/23 1351  

## 2023-03-23 NOTE — ED Triage Notes (Addendum)
Pt presents to uc with co of cough  and congestion for 1 week with worsening over the weekend. Pt has been taking otc day quill robitussin and elderberry and aleeve. Pt reports no sick contacts  Covid neg at home

## 2023-03-23 NOTE — Discharge Instructions (Addendum)
Advised patient to take medications as directed with food to completion.  Advised patient to take prednisone with first dose of Augmentin for the next 5 of 7 days.  Advised may take Tessalon capsules daily or as needed for cough.  Advised may use Promethazine DM at night for cough prior to sleep due to sedative effects.  Encouraged increase daily water intake to 64 ounces per day while taking these medications.  Advised if symptoms worsen and/or unresolved please follow-up with PCP or here for further evaluation.

## 2023-03-24 ENCOUNTER — Encounter: Payer: Self-pay | Admitting: Sports Medicine

## 2023-03-30 ENCOUNTER — Encounter: Payer: Self-pay | Admitting: Sports Medicine

## 2023-03-30 ENCOUNTER — Ambulatory Visit: Payer: 59 | Admitting: Sports Medicine

## 2023-03-30 VITALS — BP 146/96 | HR 90

## 2023-03-30 DIAGNOSIS — U071 COVID-19: Secondary | ICD-10-CM

## 2023-03-30 MED ORDER — HYDROCOD POLI-CHLORPHE POLI ER 10-8 MG/5ML PO SUER: 5.0000 mL | Freq: Two times a day (BID) | ORAL | 0 refills | Status: DC | PRN

## 2023-03-30 NOTE — Assessment & Plan Note (Signed)
Pleasant 62 year old male, headache, facial pain, cough, fatigue since Monday of last week, 7 days ago. He went to the urgent care, a flu test was done that was negative. He was started on amoxicillin, prednisone and sent home. His wife was then diagnosed the next day with a positive COVID test. Though Brandon Riley was never tested for COVID I think it is likely he has it. Were symptom is cough and fatigue. Cough is nonproductive, it does occur at the night and in the morning. I explained to him the importance of patients and symptom control along the way in his body would clean up the mess created by COVID. His exam today is completely benign, lungs are clear. Adding Tussionex to help his cough, he can do some cough drops during the day and [employer] which he has already. Return to see me as needed.

## 2023-03-30 NOTE — Telephone Encounter (Signed)
Patient needs to be scheduled nurse visit for twelve-lead ECG

## 2023-03-30 NOTE — Progress Notes (Signed)
    Procedures performed today:    None.  Independent interpretation of notes and tests performed by another provider:   None.  Brief History, Exam, Impression, and Recommendations:    COVID-19 Pleasant 62 year old male, headache, facial pain, cough, fatigue since Monday of last week, 7 days ago. He went to the urgent care, a flu test was done that was negative. He was started on amoxicillin, prednisone and sent home. His wife was then diagnosed the next day with a positive COVID test. Though Brandon Riley was never tested for COVID I think it is likely he has it. Were symptom is cough and fatigue. Cough is nonproductive, it does occur at the night and in the morning. I explained to him the importance of patients and symptom control along the way in his body would clean up the mess created by COVID. His exam today is completely benign, lungs are clear. Adding Tussionex to help his cough, he can do some cough drops during the day and [employer] which he has already. Return to see me as needed.    ____________________________________________ Ihor Austin. Benjamin Stain, M.D., ABFM., CAQSM., AME. Primary Care and Sports Medicine [facility]  Adjunct Professor of Family Medicine  [employer] [facility] [employer]  Restaurant manager, fast food

## 2023-03-30 NOTE — Patient Instructions (Signed)

## 2023-04-07 ENCOUNTER — Ambulatory Visit: Payer: BC Managed Care – PPO | Admitting: Sports Medicine

## 2023-05-04 ENCOUNTER — Encounter: Payer: Self-pay | Admitting: Sports Medicine

## 2023-05-04 ENCOUNTER — Ambulatory Visit (INDEPENDENT_AMBULATORY_CARE_PROVIDER_SITE_OTHER): Payer: 59 | Admitting: Sports Medicine

## 2023-05-04 DIAGNOSIS — E66812 Obesity, class 2: Secondary | ICD-10-CM

## 2023-05-04 DIAGNOSIS — Z6838 Body mass index (BMI) 38.0-38.9, adult: Secondary | ICD-10-CM | POA: Diagnosis not present

## 2023-05-04 DIAGNOSIS — R972 Elevated prostate specific antigen [PSA]: Secondary | ICD-10-CM | POA: Diagnosis not present

## 2023-05-04 DIAGNOSIS — M5030 Other cervical disc degeneration, unspecified cervical region: Secondary | ICD-10-CM

## 2023-05-04 DIAGNOSIS — E291 Testicular hypofunction: Secondary | ICD-10-CM

## 2023-05-04 MED ORDER — KETOROLAC TROMETHAMINE 30 MG/ML IJ SOLN: 30.0000 mg | Freq: Once | INTRAMUSCULAR | 0 refills | Status: DC

## 2023-05-04 MED ORDER — ZEPBOUND 2.5 MG/0.5ML ~~LOC~~ SOAJ: 2.5000 mg | SUBCUTANEOUS | 0 refills | Status: DC

## 2023-05-04 MED ORDER — KETOROLAC TROMETHAMINE 60 MG/2ML IM SOLN
30.0000 mg | Freq: Once | INTRAMUSCULAR | Status: AC
  Administered 2023-05-04: 30 mg via INTRAMUSCULAR

## 2023-05-04 MED ORDER — METHYLPREDNISOLONE SODIUM SUCC 125 MG IJ SOLR
125.0000 mg | Freq: Once | INTRAMUSCULAR | Status: AC
  Administered 2023-05-04: 125 mg via INTRAMUSCULAR

## 2023-05-04 NOTE — Progress Notes (Addendum)
    Procedures performed today:    None.  Independent interpretation of notes and tests performed by another provider:   None.  Brief History, Exam, Impression, and Recommendations:    Obesity Multiple weight loss medications, stimulants not effective, not losing weight with diet and exercise. He will be enrolled in a multidisciplinary weight loss program with calorie counting and an exercise prescription. We will try to get him approved for Zepbound  pair If this is not an option we can do Wegovy.  Degenerative disc disease, cervical Known cervical radiculitis, left-sided with DDD, mostly axial symptoms, periscapular, historically controlled with gabapentin , historically on 1600 mg nightly but none of this is effective. Oxycodone  not effective. He does need an epidural at this juncture. Proceeding with left-sided C6-C7 interlaminar epidural, he was expecting to get an injection today, informed that we cannot do it here so we will do Toradol  30 and Solu-Medrol  125 intramuscular to hold him over. Return to see me 6 weeks after epidural injection with interventional radiology at [facility] imaging. This will hold him over for now, and he would like to work with the same spine interventionalist that his wife does, Dr. Darwyn Blanch. I will place the referral.    ____________________________________________ Debby PARAS. Curtis, M.D., ABFM., CAQSM., AME. Primary Care and Sports Medicine [facility]  Adjunct Professor of [facility] Medicine  [employer]  Restaurant Manager, [employer]

## 2023-05-04 NOTE — Addendum Note (Signed)
 Addended by: Monica Becton on: 05/04/2023 02:43 PM   Modules accepted: Orders

## 2023-05-04 NOTE — Assessment & Plan Note (Signed)
 Multiple weight loss medications, stimulants not effective, not losing weight with diet and exercise. He will be enrolled in a multidisciplinary weight loss program with calorie counting and an exercise prescription. We will try to get him approved for Zepbound  pair If this is not an option we can do Wegovy.

## 2023-05-04 NOTE — Addendum Note (Signed)
 Addended by: Ernest Mallick A on: 05/04/2023 11:20 AM   Modules accepted: Orders

## 2023-05-04 NOTE — Assessment & Plan Note (Addendum)
 Known cervical radiculitis, left-sided with DDD, mostly axial symptoms, periscapular, historically controlled with gabapentin , historically on 1600 mg nightly but none of this is effective. Oxycodone  not effective. He does need an epidural at this juncture. Proceeding with left-sided C6-C7 interlaminar epidural, he was expecting to get an injection today, informed that we cannot do it here so we will do Toradol  30 and Solu-Medrol  125 intramuscular to hold him over. Return to see me 6 weeks after epidural injection with interventional radiology at [facility] imaging. This will hold him over for now, and he would like to work with the same spine interventionalist that his wife does, Dr. Darwyn Blanch. I will place the referral.

## 2023-05-05 ENCOUNTER — Encounter (INDEPENDENT_AMBULATORY_CARE_PROVIDER_SITE_OTHER): Payer: 59 | Admitting: Sports Medicine

## 2023-05-05 DIAGNOSIS — F419 Anxiety disorder, unspecified: Secondary | ICD-10-CM | POA: Diagnosis not present

## 2023-05-05 LAB — HEMOGLOBIN A1C
Est. average glucose Bld gHb Est-mCnc: 128 mg/dL
Hgb A1c MFr Bld: 6.1 % — ABNORMAL HIGH (ref 4.8–5.6)

## 2023-05-05 LAB — LIPID PANEL
Chol/HDL Ratio: 3.5 {ratio} (ref 0.0–5.0)
Cholesterol, Total: 140 mg/dL (ref 100–199)
HDL: 40 mg/dL (ref >39)
LDL Chol Calc (NIH): 67 mg/dL (ref 0–99)
Triglycerides: 200 mg/dL — ABNORMAL HIGH (ref 0–149)
VLDL Cholesterol Cal: 33 mg/dL (ref 5–40)

## 2023-05-05 LAB — COMPREHENSIVE METABOLIC PANEL
ALT: 35 [IU]/L (ref 0–44)
AST: 33 [IU]/L (ref 0–40)
Albumin: 4.5 g/dL (ref 3.9–4.9)
Alkaline Phosphatase: 89 [IU]/L (ref 44–121)
BUN/Creatinine Ratio: 18 (ref 10–24)
BUN: 17 mg/dL (ref 8–27)
Bilirubin Total: 1.3 mg/dL — ABNORMAL HIGH (ref 0.0–1.2)
CO2: 21 mmol/L (ref 20–29)
Calcium: 9.6 mg/dL (ref 8.6–10.2)
Chloride: 101 mmol/L (ref 96–106)
Creatinine, Ser: 0.95 mg/dL (ref 0.76–1.27)
Globulin, Total: 2.5 g/dL (ref 1.5–4.5)
Glucose: 125 mg/dL — ABNORMAL HIGH (ref 70–99)
Potassium: 4.2 mmol/L (ref 3.5–5.2)
Sodium: 138 mmol/L (ref 134–144)
Total Protein: 7 g/dL (ref 6.0–8.5)
eGFR: 90 mL/min/{1.73_m2} (ref >59)

## 2023-05-05 LAB — TESTOSTERONE, FREE, TOTAL, SHBG
Sex Hormone Binding: 39.9 nmol/L (ref 19.3–76.4)
Testosterone, Free: 8.2 pg/mL (ref 6.6–18.1)
Testosterone: 315 ng/dL (ref 264–916)

## 2023-05-05 LAB — CBC
Hematocrit: 45.1 % (ref 37.5–51.0)
Hemoglobin: 15.5 g/dL (ref 13.0–17.7)
MCH: 30.8 pg (ref 26.6–33.0)
MCHC: 34.4 g/dL (ref 31.5–35.7)
MCV: 90 fL (ref 79–97)
Platelets: 221 10*3/uL (ref 150–450)
RBC: 5.04 x10E6/uL (ref 4.14–5.80)
RDW: 12.6 % (ref 11.6–15.4)
WBC: 3.8 10*3/uL (ref 3.4–10.8)

## 2023-05-05 LAB — TSH: TSH: 1.72 u[IU]/mL (ref 0.450–4.500)

## 2023-05-05 LAB — PSA, TOTAL AND FREE
PSA, Free Pct: 28.5 %
PSA, Free: 1.57 ng/mL
Prostate Specific Ag, Serum: 5.5 ng/mL — ABNORMAL HIGH (ref 0.0–4.0)

## 2023-05-06 ENCOUNTER — Encounter: Payer: Self-pay | Admitting: Sports Medicine

## 2023-05-06 MED ORDER — TRIAZOLAM 0.25 MG PO TABS: ORAL_TABLET | ORAL | 0 refills | Status: DC

## 2023-05-06 NOTE — Telephone Encounter (Signed)

## 2023-05-07 NOTE — Telephone Encounter (Signed)
 Please look into this, I do not see that I sent in ketorolac to the patient's pharmacy although he did get some intramuscular in the office.  Did a staff member sent this in by accident?

## 2023-05-08 NOTE — Discharge Instructions (Signed)

## 2023-05-11 ENCOUNTER — Ambulatory Visit
Admission: RE | Admit: 2023-05-11 | Discharge: 2023-05-11 | Disposition: A | Discharge status: Home / Self Care | Payer: 59 | Source: Ambulatory Visit | Attending: Sports Medicine | Admitting: Sports Medicine

## 2023-05-11 DIAGNOSIS — M5030 Other cervical disc degeneration, unspecified cervical region: Secondary | ICD-10-CM

## 2023-05-11 DIAGNOSIS — M4722 Other spondylosis with radiculopathy, cervical region: Secondary | ICD-10-CM | POA: Diagnosis not present

## 2023-05-11 MED ORDER — TRIAMCINOLONE ACETONIDE 40 MG/ML IJ SUSP (RADIOLOGY)
60.0000 mg | Freq: Once | INTRAMUSCULAR | Status: AC
  Administered 2023-05-11: 60 mg via EPIDURAL

## 2023-05-11 MED ORDER — IOPAMIDOL (ISOVUE-M 300) INJECTION 61%
1.0000 mL | Freq: Once | INTRAMUSCULAR | Status: AC
  Administered 2023-05-11: 1 mL via EPIDURAL

## 2023-05-12 ENCOUNTER — Encounter: Payer: Self-pay | Admitting: Sports Medicine

## 2023-05-12 ENCOUNTER — Ambulatory Visit (INDEPENDENT_AMBULATORY_CARE_PROVIDER_SITE_OTHER): Payer: 59 | Admitting: Sports Medicine

## 2023-05-12 VITALS — BP 160/89 | HR 96 | Ht 68.0 in | Wt 237.0 lb

## 2023-05-12 DIAGNOSIS — R030 Elevated blood-pressure reading, without diagnosis of hypertension: Secondary | ICD-10-CM | POA: Diagnosis not present

## 2023-05-12 DIAGNOSIS — Z01818 Encounter for other preprocedural examination: Secondary | ICD-10-CM | POA: Diagnosis not present

## 2023-05-12 MED ORDER — BLOOD PRESSURE CUFF MISC: 0 refills | Status: DC

## 2023-05-12 NOTE — Assessment & Plan Note (Addendum)
 Brandon Riley returns, he is a very pleasant 63 year old male, he is here for surgical clearance for total knee arthroplasty. He does have greater than 4 metabolic equivalents of cardiac capacity, his ECG was acceptable, his blood pressure is elevated here today. He also has sleep apnea. No history of bleeding diathesis. Due to his elevated blood pressure on 2 occasions today we will switch to ambulatory blood pressure monitoring, I will send a home blood pressure cuff to his pharmacy and he can send me blood pressure readings over the next couple of weeks on MyChart.

## 2023-05-12 NOTE — Assessment & Plan Note (Signed)
 Due to his elevated blood pressure on 2 occasions today we will switch to ambulatory blood pressure monitoring, I will send a home blood pressure cuff to his pharmacy and he can send me blood pressure readings over the next couple of weeks on MyChart.

## 2023-05-12 NOTE — Progress Notes (Signed)
    Procedures performed today:    Twelve-lead ECG performed by me and interpreted by me, he is in normal sinus rhythm with a rate of 94 bpm, normal axis.  No PR, QRS, or ST segment abnormalities.  Independent interpretation of notes and tests performed by another provider:   None.  Brief History, Exam, Impression, and Recommendations:    Preoperative clearance Brandon Riley returns, he is a very pleasant 63 year old male, he is here for surgical clearance for total knee arthroplasty. He does have greater than 4 metabolic equivalents of cardiac capacity, his ECG was acceptable, his blood pressure is elevated here today. He also has sleep apnea. No history of bleeding diathesis. Due to his elevated blood pressure on 2 occasions today we will switch to ambulatory blood pressure monitoring, I will send a home blood pressure cuff to his pharmacy and he can send me blood pressure readings over the next couple of weeks on MyChart.  Elevated blood pressure reading Due to his elevated blood pressure on 2 occasions today we will switch to ambulatory blood pressure monitoring, I will send a home blood pressure cuff to his pharmacy and he can send me blood pressure readings over the next couple of weeks on MyChart.    ____________________________________________ Brandon Riley, M.D., ABFM., CAQSM., AME. Primary Care and Sports Medicine [facility]  Adjunct Professor of [facility] Medicine  [employer]  Restaurant Manager, [employer]

## 2023-05-21 ENCOUNTER — Telehealth: Payer: Self-pay

## 2023-05-21 NOTE — Telephone Encounter (Signed)
   Name: Brandon Riley  DOB: [birth year]  MRN: [id number]  Primary Cardiologist: None  Chart reviewed as part of pre-operative protocol coverage. Because of Brandon Riley's past medical history and time since last visit, he will require a follow-up in-office visit in order to better assess preoperative cardiovascular risk.  Patient is currently not established with our practice and will need to establish as a new patient.  Pre-op covering staff: - Please schedule appointment and call patient to inform them. If patient already had an upcoming appointment within acceptable timeframe, please add "pre-op clearance" to the appointment notes so provider is aware. - Please contact requesting surgeon's office via preferred method (i.e, phone, fax) to inform them of need for appointment prior to surgery.   Napoleon Form, Leodis Rains, NP  05/21/2023, 10:33 AM

## 2023-05-21 NOTE — Telephone Encounter (Signed)
I will send a message to our scheduling team to ask if they can help find new pt appt for preop clearance.

## 2023-05-21 NOTE — Telephone Encounter (Signed)
   [facility] Pre-operative Risk Assessment    Request for surgical clearance:  What type of surgery is being performed? Left Total Knee arthoplasty    When is this surgery scheduled? TBD   What type of clearance is required (medical clearance vs. Pharmacy clearance to hold med vs. Both)? Both  Are there any medications that need to be held prior to surgery and how long?Not specified   Practice name and name of physician performing surgery? Dr. Weber Cooks at [facility]   What is your office phone number:[phone] ext [phone] Brandon Riley    7.   What is your office fax number: [phone]  8.   Anesthesia type (None, local, MAC, general) ? Spinal    Brandon Riley 05/21/2023, 10:28 AM  _________________________________________________________________   (provider comments below)

## 2023-05-21 NOTE — Telephone Encounter (Signed)
I will update all parties involved pt has new pt appt 05/22/23 Dr. Jacques Navy.

## 2023-05-22 ENCOUNTER — Encounter: Payer: Self-pay | Admitting: Internal Medicine

## 2023-05-22 ENCOUNTER — Ambulatory Visit: Payer: 59 | Attending: Internal Medicine | Admitting: Internal Medicine

## 2023-05-22 VITALS — BP 160/100 | HR 92 | Ht 68.0 in | Wt 236.6 lb

## 2023-05-22 DIAGNOSIS — K219 Gastro-esophageal reflux disease without esophagitis: Secondary | ICD-10-CM

## 2023-05-22 DIAGNOSIS — G4733 Obstructive sleep apnea (adult) (pediatric): Secondary | ICD-10-CM | POA: Diagnosis not present

## 2023-05-22 DIAGNOSIS — R030 Elevated blood-pressure reading, without diagnosis of hypertension: Secondary | ICD-10-CM | POA: Diagnosis not present

## 2023-05-22 DIAGNOSIS — E781 Pure hyperglyceridemia: Secondary | ICD-10-CM | POA: Diagnosis not present

## 2023-05-22 DIAGNOSIS — G2581 Restless legs syndrome: Secondary | ICD-10-CM

## 2023-05-22 DIAGNOSIS — Z01810 Encounter for preprocedural cardiovascular examination: Secondary | ICD-10-CM | POA: Diagnosis not present

## 2023-05-22 NOTE — Progress Notes (Signed)
Cardiology Office Note:  .   Date:  05/22/2023  ID:  [name], DOB [birth year], MRN [id number] PCP: Monica Becton, MD  [facility] Providers Cardiologist:  Parke Poisson, MD    History of Present Illness: .   Brandon Riley is a 63 y.o. male.  Discussed the use of AI scribe software for clinical note transcription with the patient, who gave verbal consent to proceed.  History of Present Illness   The patient, with a history of hypertension, sleep apnea, and restless leg syndrome, presents for preoperative clearance for a knee surgery. The patient reports that the blood pressure has been high recently, which is being managed by the primary care physician. The patient denies any chest pain or shortness of breath. The patient is physically active and works in a labor-intensive job without any discomfort or symptoms suggestive of cardiac disease, easily greater than 4 METS per his report. The patient has had previous surgeries including a meniscus repair and a sinus surgery without any complications of anesthesia. The patient uses a BiPAP machine for sleep apnea regularly and is on ropinirole and gabapentin for restless leg syndrome. The patient reports minimal alcohol use and does not smoke.        ROS: negative except per HPI above.  Studies Reviewed: Marland Kitchen   EKG Interpretation Date/Time:  Friday May 22 2023 09:18:21 EST Ventricular Rate:  92 PR Interval:  146 QRS Duration:  90 QT Interval:  350 QTC Calculation: 432 R Axis:   60  Text Interpretation: Normal sinus rhythm Nonspecific T wave abnormality No previous ECGs available Confirmed by Weston Brass ([id number]) on 05/22/2023 9:21:28 AM    Results         Risk Assessment/Calculations:    Physical Exam:   VS:  BP (!) 160/100   Pulse 92   Ht 5\' 8"  (1.727 m)   Wt 236 lb 9.6 oz (107.3 kg)   SpO2 95%   BMI 35.97 kg/m    Wt Readings from Last 3 Encounters:  05/22/23 236 lb 9.6 oz (107.3 kg)  05/12/23 237  lb (107.5 kg)  02/24/23 247 lb (112 kg)     Physical Exam   CHEST: Breath sounds clear. CARDIOVASCULAR: Heart sounds normal.     GEN: Well nourished, well developed in no acute distress NECK: No JVD; No carotid bruits CARDIAC: RRR, no murmurs, rubs, gallops RESPIRATORY:  Clear to auscultation without rales, wheezing or rhonchi  ABDOMEN: Soft, non-tender, non-distended EXTREMITIES:  No edema; No deformity   ASSESSMENT AND PLAN: .    Assessment & Plan Preoperative cardiovascular examination  Elevated blood pressure reading  Obstructive sleep apnea on BiPAP with periodic limb movement disorder  Gastroesophageal reflux disease, unspecified whether esophagitis present  Pure hypertriglyceridemia  Restless leg syndrome   Assessment and Plan    Preoperative Cardiac Evaluation Patient with hypertension, physically active with heavy labor, no chest pain, shortness of breath, or syncope. EKG showed nonspecific abnormality, but in the absence of symptoms at heavy workload, it is not concerning. Patient has a history of successfully tolerating anesthesia for previous surgeries. -Deemed low risk for upcoming knee surgery. -If symptoms change or blood pressure remains uncontrolled, patient should return for further evaluation and possible stress test. -The patient is low risk for intermediate risk procedure.  No further cardiovascular testing is required prior to the procedure.  If this level of risk is acceptable to the patient and surgical team, the patient should be considered optimized from  a cardiovascular standpoint. Pt understands and is willing to proceed.   Hypertension Recent increase in blood pressure, possibly related to knee pain. Patient and primary care physician are addressing the issue. -Continue management with primary care physician per patient preference. Monitoring BP at home. Recommend low dose anti-htn if BP remains above goal of 140/90 mmHg at next PCP visit.    HLD - continue crestor 10 mg daily  Restless Leg Syndrome Managed with Ropinirole 3mg , previously on Gabapentin.   Follow-up in 9 months per patient preference to reassess EKG and overall health status.

## 2023-05-22 NOTE — Patient Instructions (Signed)
Medication Instructions:  Your physician recommends that you continue on your current medications as directed. Please refer to the Current Medication list given to you today.  *If you need a refill on your cardiac medications before your next appointment, please call your pharmacy*  Lab Work: None  Follow-Up: At [facility], you and your health needs are our priority.  As part of our continuing mission to provide you with exceptional heart care, we have created designated Provider Care Teams.  These Care Teams include your primary Cardiologist (physician) and Advanced Practice Providers (APPs -  Physician Assistants and Nurse Practitioners) who all work together to provide you with the care you need, when you need it.  Your next appointment:   9 month(s)  Provider:   Parke Poisson, MD

## 2023-05-26 ENCOUNTER — Telehealth: Payer: Self-pay | Admitting: Sports Medicine

## 2023-05-26 ENCOUNTER — Encounter: Payer: Self-pay | Admitting: Sports Medicine

## 2023-05-26 ENCOUNTER — Ambulatory Visit (INDEPENDENT_AMBULATORY_CARE_PROVIDER_SITE_OTHER): Payer: 59 | Admitting: Sports Medicine

## 2023-05-26 ENCOUNTER — Ambulatory Visit: Payer: 59

## 2023-05-26 VITALS — BP 135/79 | HR 90 | Ht 68.0 in | Wt 231.0 lb

## 2023-05-26 DIAGNOSIS — U071 COVID-19: Secondary | ICD-10-CM | POA: Diagnosis not present

## 2023-05-26 DIAGNOSIS — R053 Chronic cough: Secondary | ICD-10-CM | POA: Diagnosis not present

## 2023-05-26 DIAGNOSIS — I10 Essential (primary) hypertension: Secondary | ICD-10-CM | POA: Diagnosis not present

## 2023-05-26 MED ORDER — AMLODIPINE BESYLATE 5 MG PO TABS: 5.0000 mg | ORAL_TABLET | Freq: Every day | ORAL | 3 refills | Status: DC

## 2023-05-26 MED ORDER — PREDNISONE 50 MG PO TABS: ORAL_TABLET | ORAL | 0 refills | Status: DC

## 2023-05-26 MED ORDER — AZITHROMYCIN 250 MG PO TABS: ORAL_TABLET | ORAL | 0 refills | Status: DC

## 2023-05-26 MED ORDER — BENZONATATE 200 MG PO CAPS
200.0000 mg | ORAL_CAPSULE | Freq: Three times a day (TID) | ORAL | 0 refills | Status: DC | PRN
Start: 2023-05-26 — End: 2023-07-09

## 2023-05-26 MED ORDER — AIRSUPRA 90-80 MCG/ACT IN AERO: 2.0000 | INHALATION_SPRAY | Freq: Four times a day (QID) | RESPIRATORY_TRACT | 11 refills | Status: DC | PRN

## 2023-05-26 MED ORDER — HYDROCOD POLI-CHLORPHE POLI ER 10-8 MG/5ML PO SUER: 5.0000 mL | Freq: Two times a day (BID) | ORAL | 0 refills | Status: DC | PRN

## 2023-05-26 NOTE — Assessment & Plan Note (Signed)
COVID early December, persistent cough. His lungs were a bit coarse today, we will add albuterol, chest x-ray, azithromycin and prednisone. I will refill his Tessalon Perles and Tussionex.

## 2023-05-26 NOTE — Assessment & Plan Note (Signed)
Blood pressure was borderline today but on average his home blood pressure readings are upper 140s to 150s systolic. Adding amlodipine 5, he can continue to check his blood pressures over the next couple of weeks and let me know.

## 2023-05-26 NOTE — Progress Notes (Signed)
    Procedures performed today:    None.  Independent interpretation of notes and tests performed by another provider:   None.  Brief History, Exam, Impression, and Recommendations:    COVID-19 COVID early December, persistent cough. His lungs were a bit coarse today, we will add albuterol, chest x-ray, azithromycin and prednisone. I will refill his Tessalon Perles and Tussionex.   Benign essential hypertension Blood pressure was borderline today but on average his home blood pressure readings are upper 140s to 150s systolic. Adding amlodipine 5, he can continue to check his blood pressures over the next couple of weeks and let me know.    ____________________________________________ Ihor Austin. Benjamin Stain, M.D., ABFM., CAQSM., AME. Primary Care and Sports Medicine  [facility]  Adjunct Professor of Family Medicine  [employer]  Restaurant manager, fast food

## 2023-05-26 NOTE — Telephone Encounter (Signed)
Hi Vanicia, called in tirzepatide/Zepbound for this guy earlier this month, he has not heard anything back so wondering if you had any ideas?

## 2023-05-27 ENCOUNTER — Telehealth: Payer: Self-pay

## 2023-05-27 NOTE — Telephone Encounter (Signed)
Prior auth for: ZEPBOUND Determination: Pending as of 05/27/23 Auth #: [id number] Valid from: n/a Patient notified via MyChart

## 2023-06-17 ENCOUNTER — Ambulatory Visit: Payer: 59 | Admitting: Sports Medicine

## 2023-06-24 ENCOUNTER — Encounter: Payer: Self-pay | Admitting: Sports Medicine

## 2023-06-24 ENCOUNTER — Ambulatory Visit (INDEPENDENT_AMBULATORY_CARE_PROVIDER_SITE_OTHER): Payer: 59 | Admitting: Sports Medicine

## 2023-06-24 VITALS — BP 131/81 | HR 86 | Resp 20 | Ht 68.0 in | Wt 237.0 lb

## 2023-06-24 DIAGNOSIS — Z6838 Body mass index (BMI) 38.0-38.9, adult: Secondary | ICD-10-CM | POA: Diagnosis not present

## 2023-06-24 DIAGNOSIS — M170 Bilateral primary osteoarthritis of knee: Secondary | ICD-10-CM | POA: Diagnosis not present

## 2023-06-24 DIAGNOSIS — I10 Essential (primary) hypertension: Secondary | ICD-10-CM | POA: Diagnosis not present

## 2023-06-24 DIAGNOSIS — E66812 Obesity, class 2: Secondary | ICD-10-CM | POA: Diagnosis not present

## 2023-06-24 NOTE — Assessment & Plan Note (Signed)
 Bilateral knee osteoarthritis, he does have knee arthroplasty scheduled in a month.

## 2023-06-24 NOTE — Progress Notes (Signed)
    Procedures performed today:    None.  Independent interpretation of notes and tests performed by another provider:   None.  Brief History, Exam, Impression, and Recommendations:    Primary osteoarthritis of both knees with pseudogout Bilateral knee osteoarthritis, he does have knee arthroplasty scheduled in a month.  Obesity Still awaiting Zepbound approval, to recap he has tried multiple weight loss medications including stimulants which were not effective. He has tried diet and exercise. He will be enrolled in a multidisciplinary weight loss program with calorie counting, and exercise prescription, he has no contraindications to GLP-1's and he will not be taking any other weight loss medications concurrently.   Benign essential hypertension I reviewed Laquan's blood pressure diary, it typically runs 120s to 140s systolic. Blood pressure is acceptable today, continue current medications. I am hesitant to increase his medication as he is in pain with regards to his knee arthritis, and he does have an arthroplasty coming up next month. We will monitor this. I would like to see him back about a month or 2 after his arthroplasty.    ____________________________________________ Ihor Austin. Benjamin Stain, M.D., ABFM., CAQSM., AME. Primary Care and Sports Medicine [place] [facility]  Adjunct Professor of Family Medicine  [employer] [employer]  Restaurant manager, fast food

## 2023-06-24 NOTE — Assessment & Plan Note (Signed)
 Still awaiting Zepbound approval, to recap he has tried multiple weight loss medications including stimulants which were not effective. He has tried diet and exercise. He will be enrolled in a multidisciplinary weight loss program with calorie counting, and exercise prescription, he has no contraindications to GLP-1's and he will not be taking any other weight loss medications concurrently.

## 2023-06-24 NOTE — Assessment & Plan Note (Signed)
 I reviewed Brandon Riley's blood pressure diary, it typically runs 120s to 140s systolic. Blood pressure is acceptable today, continue current medications. I am hesitant to increase his medication as he is in pain with regards to his knee arthritis, and he does have an arthroplasty coming up next month. We will monitor this. I would like to see him back about a month or 2 after his arthroplasty.

## 2023-06-25 NOTE — Telephone Encounter (Signed)
 Original PA was archived in covermymeds without a completion. New pa submitted.   Prior auth for: ZEPBOUND 2.5 MG Determination: Pending as of 06/25/23 Auth #: [id number] Valid from: n/a

## 2023-06-30 DIAGNOSIS — M25562 Pain in left knee: Secondary | ICD-10-CM | POA: Diagnosis not present

## 2023-06-30 DIAGNOSIS — M6281 Muscle weakness (generalized): Secondary | ICD-10-CM | POA: Diagnosis not present

## 2023-06-30 DIAGNOSIS — M1712 Unilateral primary osteoarthritis, left knee: Secondary | ICD-10-CM | POA: Diagnosis not present

## 2023-06-30 DIAGNOSIS — M25662 Stiffness of left knee, not elsewhere classified: Secondary | ICD-10-CM | POA: Diagnosis not present

## 2023-06-30 DIAGNOSIS — R269 Unspecified abnormalities of gait and mobility: Secondary | ICD-10-CM | POA: Diagnosis not present

## 2023-07-01 ENCOUNTER — Ambulatory Visit: Payer: Self-pay | Admitting: Emergency Medicine

## 2023-07-01 DIAGNOSIS — G8929 Other chronic pain: Secondary | ICD-10-CM

## 2023-07-01 NOTE — H&P (Signed)
 TOTAL KNEE ADMISSION H&P  Patient is being admitted for left total knee arthroplasty.  Subjective:  Chief Complaint:left knee pain.  HPI: Brandon Riley, 63 y.o. male, has a history of pain and functional disability in the left knee due to arthritis and has failed non-surgical conservative treatments for greater than 12 weeks to includeNSAID's and/or analgesics and activity modification.  Onset of symptoms was gradual, starting  >5  years ago with gradually worsening course since that time. The patient noted no past surgery on the left knee(s).  Patient currently rates pain in the left knee(s) at 10 out of 10 with activity. Patient has night pain, worsening of pain with activity and weight bearing, pain that interferes with activities of daily living, and pain with passive range of motion.  Patient has evidence of periarticular osteophytes and joint space narrowing by imaging studies.  There is no active infection.  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   Obstructive sleep apnea on BiPAP with periodic limb movement disorder 06/05/2014   Obesity 05/02/2013   Annual physical exam 04/04/2013   Primary osteoarthritis of both knees with pseudogout 04/04/2013   Degenerative disc disease, cervical 04/04/2013   Hyperlipidemia 04/04/2013   Benign essential hypertension 04/04/2013   Lumbar degenerative disc disease 04/04/2013   Past Medical History:  Diagnosis Date   GERD (gastroesophageal reflux disease)     Hyperlipemia    Restless leg syndrome    Sleep apnea    w cpap    Past Surgical History:  Procedure Laterality Date   CHONDROPLASTY Left 10/28/2018   Procedure: CHONDROPLASTY;  Surgeon: Bjorn Pippin, MD;  Location: [facility];  Service: Orthopedics;  Laterality: Left;   KNEE ARTHROSCOPY WITH MEDIAL MENISECTOMY Left 10/28/2018   Procedure: LEFT KNEE ARTHROSCOPY WITH MEDIAL MENISECTOMY;  Surgeon: Bjorn Pippin, MD;  Location: [facility];  Service: Orthopedics;  Laterality: Left;   RECONSTRUCTION OF NOSE  2019   WISDOM TOOTH EXTRACTION  2012    Current Outpatient Medications  Medication Sig Dispense Refill Last Dose/Taking   Albuterol-Budesonide (AIRSUPRA) 90-80 MCG/ACT AERO Inhale 2 puffs into the lungs every 6 (six) hours as needed. 1 g 11    amLODipine (NORVASC) 5 MG tablet Take 1 tablet (5 mg total) by mouth daily. 30 tablet 3    azithromycin (ZITHROMAX Z-PAK) 250 MG tablet Take 2 tablets (500 mg) on  Day 1,  followed by 1 tablet (250 mg) once daily on Days 2 through 5. 6 tablet 0    benzonatate (TESSALON) 200 MG capsule Take 1 capsule (200 mg total) by mouth 3 (three) times daily as needed for cough. 45 capsule 0    Blood Pressure Monitoring (BLOOD PRESSURE CUFF) MISC Check blood pressure at home once or twice a day after sitting relaxed for 15 minutes 1 each 0    chlorpheniramine-HYDROcodone (TUSSIONEX) 10-8 MG/5ML Take 5 mLs by mouth every 12 (twelve) hours as needed for cough (cough, will cause drowsiness.). 120 mL 0    naproxen sodium (ALEVE) 220 MG tablet  Take 2 tablets (440 mg total) by mouth 2 (two) times daily as needed.      predniSONE (DELTASONE) 50 MG tablet One tab PO daily for 5 days. 5 tablet 0    rOPINIRole (REQUIP) 1 MG tablet Take 1-2 tablets with ropinirole 4mg  tablets. (Patient taking differently: Take 3 tablets at bedtime) 60 tablet 3    rosuvastatin (CRESTOR) 10 MG tablet TAKE ONE TABLET BY MOUTH DAILY 90 tablet 2    tirzepatide (ZEPBOUND)  2.5 MG/0.5ML Pen Inject 2.5 mg into the skin once a week. 2 mL 0    No current facility-administered medications for this visit.   Allergies  Allergen Reactions   Celebrex [Celecoxib] Palpitations    Heavy breathing   Flexeril [Cyclobenzaprine] Hives    Social History   Tobacco Use   Smoking status: Never   Smokeless tobacco: Never  Substance Use Topics   Alcohol use: Yes    Comment: 2-3x per week    Family History  Problem Relation Age of Onset   Dementia Mother    Heart Problems Mother        Double bypass   Diabetes Mother    Other Father        Prostate Issues     Review of Systems  Musculoskeletal:  Positive for arthralgias.  All other systems reviewed and are negative.   Objective:  Physical Exam Constitutional:      General: He is not in acute distress.    Appearance: Normal appearance. He is normal weight.  HENT:     Head: Normocephalic and atraumatic.  Eyes:     Extraocular Movements: Extraocular movements intact.     Conjunctiva/sclera: Conjunctivae normal.     Pupils: Pupils are equal, round, and reactive to light.  Cardiovascular:     Rate and Rhythm: Normal rate and regular rhythm.     Pulses: Normal pulses.     Heart sounds: Normal heart sounds.  Pulmonary:     Effort: Pulmonary effort is normal. No respiratory distress.     Breath sounds: Normal breath sounds.  Abdominal:     General: Bowel sounds are normal. There is no distension.     Palpations: Abdomen is soft.     Tenderness: There is no abdominal tenderness.  Musculoskeletal:        General: Tenderness present.     Cervical back: Normal range of motion and neck supple.     Comments: TTP over medial and lateral joint line, medial worse than lateral.  No calf tenderness, swelling, or erythema.  No overlying lesions of area of chief complaint.  Decreased strength and ROM due to elicited pain.  Pre-operative ROM 0-120.  Dorsiflexion and plantarflexion intact.  Stable to varus and valgus  stress.  BLE appear grossly neurovascularly intact.  Gait mildly antalgic.   Lymphadenopathy:     Cervical: No cervical adenopathy.  Skin:    General: Skin is warm and dry.     Capillary Refill: Capillary refill takes less than 2 seconds.  Neurological:     General: No focal deficit present.     Mental Status: He is alert and oriented to person, place, and time.  Psychiatric:        Mood and Affect: Mood normal.        Behavior: Behavior normal.     Vital signs in last 24 hours: @VSRANGES @  Labs:   Estimated body mass index is 36.04 kg/m as calculated from the following:   Height as of  06/24/23: 5\' 8"  (1.727 m).   Weight as of 06/24/23: 107.5 kg.   Imaging Review Plain radiographs demonstrate severe degenerative joint disease of the left knee(s). The overall alignment ismild varus. The bone quality appears to be fair for age and reported activity level.      Assessment/Plan:  End stage arthritis, left knee   The patient history, physical examination, clinical judgment of the provider and imaging studies are consistent with end stage degenerative joint disease of the left knee(s) and total knee arthroplasty is deemed medically necessary. The treatment options including medical management, injection therapy arthroscopy and arthroplasty were discussed at length. The risks and benefits of total knee arthroplasty were presented and reviewed. The risks due to aseptic loosening, infection, stiffness, patella tracking problems, thromboembolic complications and other imponderables were discussed. The patient acknowledged the explanation, agreed to proceed with the plan and consent was signed. Patient is being admitted for inpatient treatment for surgery, pain control, PT, OT, prophylactic antibiotics, VTE prophylaxis, progressive ambulation and ADL's and discharge planning. The patient is planning to be discharged home with outpatient PT.     Patient's anticipated LOS is less than 2  midnights, meeting these requirements: - Younger than 11 - Lives within 1 hour of care - Has a competent adult at home to recover with post-op recover - NO history of  - Chronic pain requiring opiods  - Diabetes  - Coronary Artery Disease  - Heart failure  - Heart attack  - Stroke  - DVT/VTE  - Cardiac arrhythmia  - Respiratory Failure/COPD  - Renal failure  - Anemia  - Advanced Liver disease

## 2023-07-01 NOTE — H&P (View-Only) (Signed)
 TOTAL KNEE ADMISSION H&P  Patient is being admitted for left total knee arthroplasty.  Subjective:  Chief Complaint:left knee pain.  HPI: Brandon Riley, 63 y.o. male, has a history of pain and functional disability in the left knee due to arthritis and has failed non-surgical conservative treatments for greater than 12 weeks to includeNSAID's and/or analgesics and activity modification.  Onset of symptoms was gradual, starting  >5  years ago with gradually worsening course since that time. The patient noted no past surgery on the left knee(s).  Patient currently rates pain in the left knee(s) at 10 out of 10 with activity. Patient has night pain, worsening of pain with activity and weight bearing, pain that interferes with activities of daily living, and pain with passive range of motion.  Patient has evidence of periarticular osteophytes and joint space narrowing by imaging studies.  There is no active infection.  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   Obstructive sleep apnea on BiPAP with periodic limb movement disorder 06/05/2014   Obesity 05/02/2013   Annual physical exam 04/04/2013   Primary osteoarthritis of both knees with pseudogout 04/04/2013   Degenerative disc disease, cervical 04/04/2013   Hyperlipidemia 04/04/2013   Benign essential hypertension 04/04/2013   Lumbar degenerative disc disease 04/04/2013   Past Medical History:  Diagnosis Date   GERD (gastroesophageal reflux disease)     Hyperlipemia    Restless leg syndrome    Sleep apnea    w cpap    Past Surgical History:  Procedure Laterality Date   CHONDROPLASTY Left 10/28/2018   Procedure: CHONDROPLASTY;  Surgeon: Bjorn Pippin, MD;  Location: [facility];  Service: Orthopedics;  Laterality: Left;   KNEE ARTHROSCOPY WITH MEDIAL MENISECTOMY Left 10/28/2018   Procedure: LEFT KNEE ARTHROSCOPY WITH MEDIAL MENISECTOMY;  Surgeon: Bjorn Pippin, MD;  Location: [facility];  Service: Orthopedics;  Laterality: Left;   RECONSTRUCTION OF NOSE  2019   WISDOM TOOTH EXTRACTION  2012    Current Outpatient Medications  Medication Sig Dispense Refill Last Dose/Taking   Albuterol-Budesonide (AIRSUPRA) 90-80 MCG/ACT AERO Inhale 2 puffs into the lungs every 6 (six) hours as needed. 1 g 11    amLODipine (NORVASC) 5 MG tablet Take 1 tablet (5 mg total) by mouth daily. 30 tablet 3    azithromycin (ZITHROMAX Z-PAK) 250 MG tablet Take 2 tablets (500 mg) on  Day 1,  followed by 1 tablet (250 mg) once daily on Days 2 through 5. 6 tablet 0    benzonatate (TESSALON) 200 MG capsule Take 1 capsule (200 mg total) by mouth 3 (three) times daily as needed for cough. 45 capsule 0    Blood Pressure Monitoring (BLOOD PRESSURE CUFF) MISC Check blood pressure at home once or twice a day after sitting relaxed for 15 minutes 1 each 0    chlorpheniramine-HYDROcodone (TUSSIONEX) 10-8 MG/5ML Take 5 mLs by mouth every 12 (twelve) hours as needed for cough (cough, will cause drowsiness.). 120 mL 0    naproxen sodium (ALEVE) 220 MG tablet  Take 2 tablets (440 mg total) by mouth 2 (two) times daily as needed.      predniSONE (DELTASONE) 50 MG tablet One tab PO daily for 5 days. 5 tablet 0    rOPINIRole (REQUIP) 1 MG tablet Take 1-2 tablets with ropinirole 4mg  tablets. (Patient taking differently: Take 3 tablets at bedtime) 60 tablet 3    rosuvastatin (CRESTOR) 10 MG tablet TAKE ONE TABLET BY MOUTH DAILY 90 tablet 2    tirzepatide (ZEPBOUND)  2.5 MG/0.5ML Pen Inject 2.5 mg into the skin once a week. 2 mL 0    No current facility-administered medications for this visit.   Allergies  Allergen Reactions   Celebrex [Celecoxib] Palpitations    Heavy breathing   Flexeril [Cyclobenzaprine] Hives    Social History   Tobacco Use   Smoking status: Never   Smokeless tobacco: Never  Substance Use Topics   Alcohol use: Yes    Comment: 2-3x per week    Family History  Problem Relation Age of Onset   Dementia Mother    Heart Problems Mother        Double bypass   Diabetes Mother    Other Father        Prostate Issues     Review of Systems  Musculoskeletal:  Positive for arthralgias.  All other systems reviewed and are negative.   Objective:  Physical Exam Constitutional:      General: He is not in acute distress.    Appearance: Normal appearance. He is normal weight.  HENT:     Head: Normocephalic and atraumatic.  Eyes:     Extraocular Movements: Extraocular movements intact.     Conjunctiva/sclera: Conjunctivae normal.     Pupils: Pupils are equal, round, and reactive to light.  Cardiovascular:     Rate and Rhythm: Normal rate and regular rhythm.     Pulses: Normal pulses.     Heart sounds: Normal heart sounds.  Pulmonary:     Effort: Pulmonary effort is normal. No respiratory distress.     Breath sounds: Normal breath sounds.  Abdominal:     General: Bowel sounds are normal. There is no distension.     Palpations: Abdomen is soft.     Tenderness: There is no abdominal tenderness.  Musculoskeletal:        General: Tenderness present.     Cervical back: Normal range of motion and neck supple.     Comments: TTP over medial and lateral joint line, medial worse than lateral.  No calf tenderness, swelling, or erythema.  No overlying lesions of area of chief complaint.  Decreased strength and ROM due to elicited pain.  Pre-operative ROM 0-120.  Dorsiflexion and plantarflexion intact.  Stable to varus and valgus  stress.  BLE appear grossly neurovascularly intact.  Gait mildly antalgic.   Lymphadenopathy:     Cervical: No cervical adenopathy.  Skin:    General: Skin is warm and dry.     Capillary Refill: Capillary refill takes less than 2 seconds.  Neurological:     General: No focal deficit present.     Mental Status: He is alert and oriented to person, place, and time.  Psychiatric:        Mood and Affect: Mood normal.        Behavior: Behavior normal.     Vital signs in last 24 hours: @VSRANGES @  Labs:   Estimated body mass index is 36.04 kg/m as calculated from the following:   Height as of  06/24/23: 5\' 8"  (1.727 m).   Weight as of 06/24/23: 107.5 kg.   Imaging Review Plain radiographs demonstrate severe degenerative joint disease of the left knee(s). The overall alignment ismild varus. The bone quality appears to be fair for age and reported activity level.      Assessment/Plan:  End stage arthritis, left knee   The patient history, physical examination, clinical judgment of the provider and imaging studies are consistent with end stage degenerative joint disease of the left knee(s) and total knee arthroplasty is deemed medically necessary. The treatment options including medical management, injection therapy arthroscopy and arthroplasty were discussed at length. The risks and benefits of total knee arthroplasty were presented and reviewed. The risks due to aseptic loosening, infection, stiffness, patella tracking problems, thromboembolic complications and other imponderables were discussed. The patient acknowledged the explanation, agreed to proceed with the plan and consent was signed. Patient is being admitted for inpatient treatment for surgery, pain control, PT, OT, prophylactic antibiotics, VTE prophylaxis, progressive ambulation and ADL's and discharge planning. The patient is planning to be discharged home with outpatient PT.     Patient's anticipated LOS is less than 2  midnights, meeting these requirements: - Younger than 11 - Lives within 1 hour of care - Has a competent adult at home to recover with post-op recover - NO history of  - Chronic pain requiring opiods  - Diabetes  - Coronary Artery Disease  - Heart failure  - Heart attack  - Stroke  - DVT/VTE  - Cardiac arrhythmia  - Respiratory Failure/COPD  - Renal failure  - Anemia  - Advanced Liver disease

## 2023-07-08 NOTE — Progress Notes (Addendum)
 Anesthesia Review:  PCP: Rodney Langton  LOV 06/24/23  Cardiologist :Jacques Navy OV 05/22/23- preop eval   PPM/ ICD: Device Orders: Rep Notified:  Chest x-ray : 06/03/23- 2 view  EKG : 05/22/23  Echo : Stress test: Cardiac Cath :   Activity level: can do a flight of stairs without difficutly  Sleep Study/ CPAP : has cpap  Fasting Blood Sugar :      / Checks Blood Sugar -- times a day:    Blood Thinner/ Instructions /Last Dose: ASA / Instructions/ Last Dose :    05/04/23- hgba1c- 6.1   Positive Covid  in December 2024   - See office visit note of 05/26/23.  No covid  results in epic.    PT has never taken Zepbound per pt

## 2023-07-08 NOTE — Patient Instructions (Signed)
 SURGICAL WAITING ROOM VISITATION  Patients having surgery or a procedure may have no more than 2 support people in the waiting area - these visitors may rotate.    Children under the age of 23 must have an adult with them who is not the patient.  Due to an increase in RSV and influenza rates and associated hospitalizations, children ages 71 and under may not visit patients in [facility] hospitals.  Visitors with respiratory illnesses are discouraged from visiting and should remain at home.  If the patient needs to stay at the hospital during part of their recovery, the visitor guidelines for inpatient rooms apply. Pre-op nurse will coordinate an appropriate time for 1 support person to accompany patient in pre-op.  This support person may not rotate.    Please refer to the [facility] website for the visitor guidelines for Inpatients (after your surgery is over and you are in a regular room).       Your procedure is scheduled on:  07/20/23    Report to [facility] Main Entrance    Report to admitting at  0515 AM   Call this number if you have problems the morning of surgery [phone]   Do not eat food :After Midnight.   After Midnight you may have the following liquids until ___ 0430___ AM DAY OF SURGERY  Water Non-Citrus Juices (without pulp, NO RED-Apple, White grape, White cranberry) Black Coffee (NO MILK/CREAM OR CREAMERS, sugar ok)  Clear Tea (NO MILK/CREAM OR CREAMERS, sugar ok) regular and decaf                             Plain Jell-O (NO RED)                                           Fruit ices (not with fruit pulp, NO RED)                                     Popsicles (NO RED)                                                               Sports drinks like Gatorade (NO RED)                     The day of surgery:  Drink ONE (1) Pre-Surgery Clear Ensure or G2 at  0430AM  ( have completed by ) the morning of surgery. Drink in one sitting. Do not sip.   This drink was given to you during your hospital  pre-op appointment visit. Nothing else to drink after completing the  Pre-Surgery Clear Ensure or G2.          If you have questions, please contact your surgeon's office.       Oral Hygiene is also important to reduce your risk of infection.  Remember - BRUSH YOUR TEETH THE MORNING OF SURGERY WITH YOUR REGULAR TOOTHPASTE  DENTURES WILL BE REMOVED PRIOR TO SURGERY PLEASE DO NOT APPLY "Poly grip" OR ADHESIVES!!!   Do NOT smoke after Midnight   Stop all vitamins and herbal supplements 7 days before surgery.   Take these medicines the morning of surgery with A SIP OF WATER:  inhalers as usual and bring, amlodipine            Zepbound- last dose on   DO NOT TAKE ANY ORAL DIABETIC MEDICATIONS DAY OF YOUR SURGERY  Bring CPAP mask and tubing day of surgery.                              You may not have any metal on your body including hair pins, jewelry, and body piercing             Do not wear make-up, lotions, powders, perfumes/cologne, or deodorant  Do not wear nail polish including gel and S&S, artificial/acrylic nails, or any other type of covering on natural nails including finger and toenails. If you have artificial nails, gel coating, etc. that needs to be removed by a nail salon please have this removed prior to surgery or surgery may need to be canceled/ delayed if the surgeon/ anesthesia feels like they are unable to be safely monitored.   Do not shave  48 hours prior to surgery.               Men may shave face and neck.   Do not bring valuables to the hospital. [facility] IS NOT             RESPONSIBLE   FOR VALUABLES.   Contacts, glasses, dentures or bridgework may not be worn into surgery.   Bring small overnight bag day of surgery.   DO NOT BRING YOUR HOME MEDICATIONS TO THE HOSPITAL. PHARMACY WILL DISPENSE MEDICATIONS LISTED ON YOUR MEDICATION LIST TO YOU DURING YOUR  ADMISSION IN THE HOSPITAL!    Patients discharged on the day of surgery will not be allowed to drive home.  Someone NEEDS to stay with you for the first 24 hours after anesthesia.   Special Instructions: Bring a copy of your healthcare power of attorney and living will documents the day of surgery if you haven't scanned them before.              Please read over the following fact sheets you were given: IF YOU HAVE QUESTIONS ABOUT YOUR PRE-OP INSTRUCTIONS PLEASE CALL [phone]   If you received a COVID test during your pre-op visit  it is requested that you wear a mask when out in public, stay away from anyone that may not be feeling well and notify your surgeon if you develop symptoms. If you test positive for Covid or have been in contact with anyone that has tested positive in the last 10 days please notify you surgeon.      Pre-operative 5 CHG Bath Instructions   You can play a key role in reducing the risk of infection after surgery. Your skin needs to be as free of germs as possible. You can reduce the number of germs on your skin by washing with CHG (chlorhexidine gluconate) soap before surgery. CHG is an antiseptic soap that kills germs and continues to kill germs even after washing.   DO NOT use if you have an allergy to chlorhexidine/CHG or  antibacterial soaps. If your skin becomes reddened or irritated, stop using the CHG and notify one of our RNs at [phone].   Please shower with the CHG soap starting 4 days before surgery using the following schedule:     Please keep in mind the following:  DO NOT shave, including legs and underarms, starting the day of your first shower.   You may shave your face at any point before/day of surgery.  Place clean sheets on your bed the day you start using CHG soap. Use a clean washcloth (not used since being washed) for each shower. DO NOT sleep with pets once you start using the CHG.   CHG Shower Instructions:  If you choose to wash  your hair and private area, wash first with your normal shampoo/soap.  After you use shampoo/soap, rinse your hair and body thoroughly to remove shampoo/soap residue.  Turn the water OFF and apply about 3 tablespoons (45 ml) of CHG soap to a CLEAN washcloth.  Apply CHG soap ONLY FROM YOUR NECK DOWN TO YOUR TOES (washing for 3-5 minutes)  DO NOT use CHG soap on face, private areas, open wounds, or sores.  Pay special attention to the area where your surgery is being performed.  If you are having back surgery, having someone wash your back for you may be helpful. Wait 2 minutes after CHG soap is applied, then you may rinse off the CHG soap.  Pat dry with a clean towel  Put on clean clothes/pajamas   If you choose to wear lotion, please use ONLY the CHG-compatible lotions on the back of this paper.     Additional instructions for the day of surgery: DO NOT APPLY any lotions, deodorants, cologne, or perfumes.   Put on clean/comfortable clothes.  Brush your teeth.  Ask your nurse before applying any prescription medications to the skin.      CHG Compatible Lotions   Aveeno Moisturizing lotion  Cetaphil Moisturizing Cream  Cetaphil Moisturizing Lotion  Clairol Herbal Essence Moisturizing Lotion, Dry Skin  Clairol Herbal Essence Moisturizing Lotion, Extra Dry Skin  Clairol Herbal Essence Moisturizing Lotion, Normal Skin  Curel Age Defying Therapeutic Moisturizing Lotion with Alpha Hydroxy  Curel Extreme Care Body Lotion  Curel Soothing Hands Moisturizing Hand Lotion  Curel Therapeutic Moisturizing Cream, Fragrance-Free  Curel Therapeutic Moisturizing Lotion, Fragrance-Free  Curel Therapeutic Moisturizing Lotion, Original Formula  Eucerin Daily Replenishing Lotion  Eucerin Dry Skin Therapy Plus Alpha Hydroxy Crme  Eucerin Dry Skin Therapy Plus Alpha Hydroxy Lotion  Eucerin Original Crme  Eucerin Original Lotion  Eucerin Plus Crme Eucerin Plus Lotion  Eucerin TriLipid Replenishing  Lotion  Keri Anti-Bacterial Hand Lotion  Keri Deep Conditioning Original Lotion Dry Skin Formula Softly Scented  Keri Deep Conditioning Original Lotion, Fragrance Free Sensitive Skin Formula  Keri Lotion Fast Absorbing Fragrance Free Sensitive Skin Formula  Keri Lotion Fast Absorbing Softly Scented Dry Skin Formula  Keri Original Lotion  Keri Skin Renewal Lotion Keri Silky Smooth Lotion  Keri Silky Smooth Sensitive Skin Lotion  Nivea Body Creamy Conditioning Oil  Nivea Body Extra Enriched Teacher, adult education Moisturizing Lotion Nivea Crme  Nivea Skin Firming Lotion  NutraDerm 30 Skin Lotion  NutraDerm Skin Lotion  NutraDerm Therapeutic Skin Cream  NutraDerm Therapeutic Skin Lotion  ProShield Protective Hand Cream  Provon moisturizing lotion

## 2023-07-13 ENCOUNTER — Encounter (HOSPITAL_COMMUNITY)
Admission: RE | Admit: 2023-07-13 | Discharge: 2023-07-13 | Disposition: A | Discharge status: Home / Self Care | Payer: 59 | Source: Ambulatory Visit | Attending: Orthopedic Surgery | Admitting: Orthopedic Surgery

## 2023-07-13 ENCOUNTER — Encounter (HOSPITAL_COMMUNITY): Payer: Self-pay

## 2023-07-13 ENCOUNTER — Other Ambulatory Visit: Payer: Self-pay

## 2023-07-13 VITALS — BP 146/87 | HR 84 | Temp 98.1°F | Resp 16 | Ht 68.0 in | Wt 233.0 lb

## 2023-07-13 DIAGNOSIS — G8929 Other chronic pain: Secondary | ICD-10-CM | POA: Diagnosis not present

## 2023-07-13 DIAGNOSIS — Z01812 Encounter for preprocedural laboratory examination: Secondary | ICD-10-CM | POA: Diagnosis present

## 2023-07-13 DIAGNOSIS — Z01818 Encounter for other preprocedural examination: Secondary | ICD-10-CM

## 2023-07-13 DIAGNOSIS — M25562 Pain in left knee: Secondary | ICD-10-CM | POA: Diagnosis not present

## 2023-07-13 HISTORY — DX: Essential (primary) hypertension: I10

## 2023-07-13 HISTORY — DX: Unspecified osteoarthritis, unspecified site: M19.90

## 2023-07-13 LAB — CBC WITH DIFFERENTIAL/PLATELET
Abs Immature Granulocytes: 0.01 10*3/uL (ref 0.00–0.07)
Basophils Absolute: 0.1 10*3/uL (ref 0.0–0.1)
Basophils Relative: 1 %
Eosinophils Absolute: 0.2 10*3/uL (ref 0.0–0.5)
Eosinophils Relative: 5 %
HCT: 43.5 % (ref 39.0–52.0)
Hemoglobin: 15 g/dL (ref 13.0–17.0)
Immature Granulocytes: 0 %
Lymphocytes Relative: 35 %
Lymphs Abs: 1.5 10*3/uL (ref 0.7–4.0)
MCH: 31.5 pg (ref 26.0–34.0)
MCHC: 34.5 g/dL (ref 30.0–36.0)
MCV: 91.4 fL (ref 80.0–100.0)
Monocytes Absolute: 0.4 10*3/uL (ref 0.1–1.0)
Monocytes Relative: 8 %
Neutro Abs: 2.1 10*3/uL (ref 1.7–7.7)
Neutrophils Relative %: 51 %
Platelets: 188 10*3/uL (ref 150–400)
RBC: 4.76 MIL/uL (ref 4.22–5.81)
RDW: 13.2 % (ref 11.5–15.5)
WBC: 4.3 10*3/uL (ref 4.0–10.5)
nRBC: 0 % (ref 0.0–0.2)

## 2023-07-13 LAB — COMPREHENSIVE METABOLIC PANEL
ALT: 26 U/L (ref 0–44)
AST: 26 U/L (ref 15–41)
Albumin: 4.1 g/dL (ref 3.5–5.0)
Alkaline Phosphatase: 60 U/L (ref 38–126)
Anion gap: 9 (ref 5–15)
BUN: 14 mg/dL (ref 8–23)
CO2: 23 mmol/L (ref 22–32)
Calcium: 9.3 mg/dL (ref 8.9–10.3)
Chloride: 104 mmol/L (ref 98–111)
Creatinine, Ser: 0.87 mg/dL (ref 0.61–1.24)
GFR, Estimated: >60 mL/min (ref >60)
Glucose, Bld: 159 mg/dL — ABNORMAL HIGH (ref 70–99)
Potassium: 3.8 mmol/L (ref 3.5–5.1)
Sodium: 136 mmol/L (ref 135–145)
Total Bilirubin: 1.6 mg/dL — ABNORMAL HIGH (ref 0.0–1.2)
Total Protein: 7.2 g/dL (ref 6.5–8.1)

## 2023-07-13 LAB — SURGICAL PCR SCREEN
MRSA, PCR: NEGATIVE
Staphylococcus aureus: POSITIVE — AB

## 2023-07-20 ENCOUNTER — Other Ambulatory Visit: Payer: Self-pay

## 2023-07-20 ENCOUNTER — Ambulatory Visit (HOSPITAL_COMMUNITY): Payer: Self-pay | Admitting: Physician Assistant

## 2023-07-20 ENCOUNTER — Encounter (HOSPITAL_COMMUNITY): Payer: Self-pay | Admitting: Orthopedic Surgery

## 2023-07-20 ENCOUNTER — Ambulatory Visit (HOSPITAL_COMMUNITY)

## 2023-07-20 ENCOUNTER — Ambulatory Visit (HOSPITAL_BASED_OUTPATIENT_CLINIC_OR_DEPARTMENT_OTHER): Admitting: Anesthesiology

## 2023-07-20 ENCOUNTER — Encounter (HOSPITAL_COMMUNITY)
Admission: RE | Disposition: A | Discharge status: Home / Self Care | Payer: Self-pay | Source: Home / Self Care | Attending: Orthopedic Surgery

## 2023-07-20 ENCOUNTER — Observation Stay (HOSPITAL_COMMUNITY)
Admission: RE | Admit: 2023-07-20 | Discharge: 2023-07-21 | Disposition: A | Discharge status: Home / Self Care | Payer: 59 | Attending: Orthopedic Surgery | Admitting: Orthopedic Surgery

## 2023-07-20 DIAGNOSIS — Z471 Aftercare following joint replacement surgery: Secondary | ICD-10-CM | POA: Diagnosis not present

## 2023-07-20 DIAGNOSIS — Z96652 Presence of left artificial knee joint: Secondary | ICD-10-CM | POA: Diagnosis present

## 2023-07-20 DIAGNOSIS — Z79899 Other long term (current) drug therapy: Secondary | ICD-10-CM | POA: Diagnosis not present

## 2023-07-20 DIAGNOSIS — M1712 Unilateral primary osteoarthritis, left knee: Secondary | ICD-10-CM

## 2023-07-20 DIAGNOSIS — Z01818 Encounter for other preprocedural examination: Secondary | ICD-10-CM

## 2023-07-20 DIAGNOSIS — Z8616 Personal history of COVID-19: Secondary | ICD-10-CM | POA: Insufficient documentation

## 2023-07-20 DIAGNOSIS — I10 Essential (primary) hypertension: Secondary | ICD-10-CM | POA: Diagnosis not present

## 2023-07-20 DIAGNOSIS — G8918 Other acute postprocedural pain: Secondary | ICD-10-CM | POA: Diagnosis not present

## 2023-07-20 HISTORY — PX: TOTAL KNEE ARTHROPLASTY: SHX125

## 2023-07-20 LAB — TYPE AND SCREEN
ABO/RH(D): O POS
Antibody Screen: NEGATIVE

## 2023-07-20 LAB — ABO/RH: ABO/RH(D): O POS

## 2023-07-20 SURGERY — ARTHROPLASTY, KNEE, TOTAL
Anesthesia: General | Site: Knee | Laterality: Left

## 2023-07-20 MED ORDER — BUPIVACAINE-EPINEPHRINE 0.25% -1:200000 IJ SOLN
INTRAMUSCULAR | Status: DC | PRN
  Administered 2023-07-20: 30 mL

## 2023-07-20 MED ORDER — CEFAZOLIN SODIUM-DEXTROSE 2-4 GM/100ML-% IV SOLN
2.0000 g | Freq: Once | INTRAVENOUS | Status: AC
  Administered 2023-07-20: 2 g via INTRAVENOUS
  Filled 2023-07-20: qty 100

## 2023-07-20 MED ORDER — FENTANYL CITRATE PF 50 MCG/ML IJ SOSY
PREFILLED_SYRINGE | INTRAMUSCULAR | Status: AC
  Filled 2023-07-20: qty 2

## 2023-07-20 MED ORDER — TRANEXAMIC ACID-NACL 1000-0.7 MG/100ML-% IV SOLN
1000.0000 mg | INTRAVENOUS | Status: AC
  Administered 2023-07-20: 1000 mg via INTRAVENOUS
  Filled 2023-07-20: qty 100

## 2023-07-20 MED ORDER — MIDAZOLAM HCL 2 MG/2ML IJ SOLN
INTRAMUSCULAR | Status: AC
  Filled 2023-07-20: qty 2

## 2023-07-20 MED ORDER — OXYCODONE HCL 5 MG PO TABS
5.0000 mg | ORAL_TABLET | Freq: Once | ORAL | Status: AC | PRN
  Administered 2023-07-20: 5 mg via ORAL

## 2023-07-20 MED ORDER — TAMSULOSIN HCL 0.4 MG PO CAPS
0.4000 mg | ORAL_CAPSULE | Freq: Every day | ORAL | Status: DC
  Administered 2023-07-20: 0.4 mg via ORAL
  Filled 2023-07-20 (×3): qty 1

## 2023-07-20 MED ORDER — METHOCARBAMOL 500 MG PO TABS
500.0000 mg | ORAL_TABLET | Freq: Four times a day (QID) | ORAL | Status: DC | PRN
  Administered 2023-07-21: 500 mg via ORAL
  Filled 2023-07-20: qty 1

## 2023-07-20 MED ORDER — FENTANYL CITRATE PF 50 MCG/ML IJ SOSY
25.0000 ug | PREFILLED_SYRINGE | INTRAMUSCULAR | Status: DC | PRN
  Administered 2023-07-20 (×3): 50 ug via INTRAVENOUS

## 2023-07-20 MED ORDER — MUPIROCIN 2 % EX OINT: 1.0000 | TOPICAL_OINTMENT | Freq: Two times a day (BID) | CUTANEOUS | 0 refills | Status: DC

## 2023-07-20 MED ORDER — PROPOFOL 1000 MG/100ML IV EMUL
INTRAVENOUS | Status: AC
  Filled 2023-07-20: qty 100

## 2023-07-20 MED ORDER — METHOCARBAMOL 500 MG PO TABS
1000.0000 mg | ORAL_TABLET | Freq: Once | ORAL | Status: AC
  Administered 2023-07-20: 1000 mg via ORAL

## 2023-07-20 MED ORDER — CHLORHEXIDINE GLUCONATE 4 % EX SOLN: 1.0000 | CUTANEOUS | 1 refills | Status: DC

## 2023-07-20 MED ORDER — BUPIVACAINE LIPOSOME 1.3 % IJ SUSP
20.0000 mL | Freq: Once | INTRAMUSCULAR | Status: DC
Start: 2023-07-20 — End: 2023-07-20

## 2023-07-20 MED ORDER — ONDANSETRON HCL 4 MG/2ML IJ SOLN
INTRAMUSCULAR | Status: AC
  Filled 2023-07-20: qty 2

## 2023-07-20 MED ORDER — BUPIVACAINE LIPOSOME 1.3 % IJ SUSP
INTRAMUSCULAR | Status: AC
  Filled 2023-07-20: qty 20

## 2023-07-20 MED ORDER — LACTATED RINGERS IV BOLUS
250.0000 mL | Freq: Once | INTRAVENOUS | Status: AC
  Administered 2023-07-20: 250 mL via INTRAVENOUS

## 2023-07-20 MED ORDER — HYDROMORPHONE HCL 1 MG/ML IJ SOLN: 0.5000 mg | INTRAMUSCULAR | Status: DC | PRN

## 2023-07-20 MED ORDER — METHOCARBAMOL 500 MG PO TABS: 500.0000 mg | ORAL_TABLET | Freq: Three times a day (TID) | ORAL | 0 refills | Status: AC | PRN

## 2023-07-20 MED ORDER — METHOCARBAMOL 500 MG PO TABS
ORAL_TABLET | ORAL | Status: AC
  Filled 2023-07-20: qty 2

## 2023-07-20 MED ORDER — LACTATED RINGERS IV SOLN: INTRAVENOUS | Status: DC

## 2023-07-20 MED ORDER — DEXAMETHASONE SODIUM PHOSPHATE 10 MG/ML IJ SOLN
8.0000 mg | Freq: Once | INTRAMUSCULAR | Status: AC
  Administered 2023-07-20: 8 mg via INTRAVENOUS

## 2023-07-20 MED ORDER — DEXAMETHASONE SODIUM PHOSPHATE 10 MG/ML IJ SOLN
INTRAMUSCULAR | Status: DC | PRN
  Administered 2023-07-20: 10 mg

## 2023-07-20 MED ORDER — CEFAZOLIN SODIUM-DEXTROSE 2-4 GM/100ML-% IV SOLN
2.0000 g | Freq: Four times a day (QID) | INTRAVENOUS | Status: AC
  Administered 2023-07-20: 2 g via INTRAVENOUS
  Filled 2023-07-20: qty 100

## 2023-07-20 MED ORDER — SODIUM CHLORIDE (PF) 0.9 % IJ SOLN
INTRAMUSCULAR | Status: AC
  Filled 2023-07-20: qty 30

## 2023-07-20 MED ORDER — KETOROLAC TROMETHAMINE 15 MG/ML IJ SOLN
INTRAMUSCULAR | Status: AC
  Filled 2023-07-20: qty 1

## 2023-07-20 MED ORDER — POLYETHYLENE GLYCOL 3350 17 G PO PACK: 17.0000 g | PACK | Freq: Every day | ORAL | Status: DC | PRN

## 2023-07-20 MED ORDER — BUPIVACAINE LIPOSOME 1.3 % IJ SUSP
INTRAMUSCULAR | Status: DC | PRN
  Administered 2023-07-20: 20 mL

## 2023-07-20 MED ORDER — ISOPROPYL ALCOHOL 70 % SOLN
Status: AC
  Filled 2023-07-20: qty 480

## 2023-07-20 MED ORDER — SODIUM CHLORIDE 0.9 % IR SOLN
Status: DC | PRN
  Administered 2023-07-20: 2000 mL

## 2023-07-20 MED ORDER — ACETAMINOPHEN 500 MG PO TABS
1000.0000 mg | ORAL_TABLET | Freq: Once | ORAL | Status: AC
  Administered 2023-07-20: 1000 mg via ORAL
  Filled 2023-07-20: qty 2

## 2023-07-20 MED ORDER — OXYCODONE HCL 5 MG PO TABS: 2.5000 mg | ORAL_TABLET | Freq: Four times a day (QID) | ORAL | 0 refills | Status: AC | PRN

## 2023-07-20 MED ORDER — ONDANSETRON HCL 4 MG/2ML IJ SOLN: 4.0000 mg | Freq: Once | INTRAMUSCULAR | Status: DC | PRN

## 2023-07-20 MED ORDER — ONDANSETRON HCL 4 MG PO TABS: 4.0000 mg | ORAL_TABLET | Freq: Three times a day (TID) | ORAL | 0 refills | Status: AC | PRN

## 2023-07-20 MED ORDER — SUCCINYLCHOLINE CHLORIDE 200 MG/10ML IV SOSY
PREFILLED_SYRINGE | INTRAVENOUS | Status: AC
  Filled 2023-07-20: qty 10

## 2023-07-20 MED ORDER — OXYCODONE HCL 5 MG PO TABS
ORAL_TABLET | ORAL | Status: AC
  Filled 2023-07-20: qty 1

## 2023-07-20 MED ORDER — CHLORHEXIDINE GLUCONATE 0.12 % MT SOLN
15.0000 mL | Freq: Once | OROMUCOSAL | Status: AC
  Administered 2023-07-20: 15 mL via OROMUCOSAL

## 2023-07-20 MED ORDER — WATER FOR IRRIGATION, STERILE IR SOLN
Status: DC | PRN
  Administered 2023-07-20: 1000 mL

## 2023-07-20 MED ORDER — SODIUM CHLORIDE 0.9 % IV SOLN: INTRAVENOUS | Status: DC

## 2023-07-20 MED ORDER — METHOCARBAMOL 1000 MG/10ML IJ SOLN: 500.0000 mg | Freq: Four times a day (QID) | INTRAMUSCULAR | Status: DC | PRN

## 2023-07-20 MED ORDER — HYDROMORPHONE HCL 1 MG/ML IJ SOLN
0.5000 mg | Freq: Once | INTRAMUSCULAR | Status: AC
  Administered 2023-07-20: 0.5 mg via INTRAVENOUS

## 2023-07-20 MED ORDER — ALBUTEROL SULFATE HFA 108 (90 BASE) MCG/ACT IN AERS
INHALATION_SPRAY | RESPIRATORY_TRACT | Status: DC | PRN
  Administered 2023-07-20: 2 via RESPIRATORY_TRACT

## 2023-07-20 MED ORDER — ASPIRIN 81 MG PO CHEW
81.0000 mg | CHEWABLE_TABLET | Freq: Two times a day (BID) | ORAL | Status: DC
  Administered 2023-07-20 – 2023-07-21 (×2): 81 mg via ORAL
  Filled 2023-07-20 (×2): qty 1

## 2023-07-20 MED ORDER — ACETAMINOPHEN 500 MG PO TABS: 1000.0000 mg | ORAL_TABLET | Freq: Three times a day (TID) | ORAL | Status: DC | PRN

## 2023-07-20 MED ORDER — ACETAMINOPHEN 325 MG PO TABS: 325.0000 mg | ORAL_TABLET | Freq: Four times a day (QID) | ORAL | Status: DC | PRN

## 2023-07-20 MED ORDER — ACETAMINOPHEN 10 MG/ML IV SOLN: 1000.0000 mg | Freq: Once | INTRAVENOUS | Status: DC | PRN

## 2023-07-20 MED ORDER — ROPINIROLE HCL 1 MG PO TABS
3.0000 mg | ORAL_TABLET | Freq: Every day | ORAL | Status: DC
  Administered 2023-07-20: 3 mg via ORAL
  Filled 2023-07-20: qty 3

## 2023-07-20 MED ORDER — ASPIRIN 81 MG PO TBEC: 81.0000 mg | DELAYED_RELEASE_TABLET | Freq: Two times a day (BID) | ORAL | Status: AC

## 2023-07-20 MED ORDER — PROPOFOL 10 MG/ML IV BOLUS
INTRAVENOUS | Status: AC
  Filled 2023-07-20: qty 20

## 2023-07-20 MED ORDER — OXYCODONE HCL 5 MG PO TABS
5.0000 mg | ORAL_TABLET | Freq: Once | ORAL | Status: AC
  Administered 2023-07-20: 5 mg via ORAL

## 2023-07-20 MED ORDER — BUPIVACAINE-EPINEPHRINE (PF) 0.5% -1:200000 IJ SOLN
INTRAMUSCULAR | Status: DC | PRN
Start: 2023-07-20 — End: 2023-07-20
  Administered 2023-07-20: 15 mL

## 2023-07-20 MED ORDER — KETAMINE HCL 50 MG/5ML IJ SOSY
PREFILLED_SYRINGE | INTRAMUSCULAR | Status: DC | PRN
  Administered 2023-07-20: 30 mg via INTRAVENOUS

## 2023-07-20 MED ORDER — DEXMEDETOMIDINE HCL IN NACL 80 MCG/20ML IV SOLN
INTRAVENOUS | Status: DC | PRN
  Administered 2023-07-20: 8 ug via INTRAVENOUS

## 2023-07-20 MED ORDER — ACETAMINOPHEN 500 MG PO TABS
1000.0000 mg | ORAL_TABLET | Freq: Four times a day (QID) | ORAL | Status: AC
  Administered 2023-07-20 – 2023-07-21 (×3): 1000 mg via ORAL
  Filled 2023-07-20 (×2): qty 2

## 2023-07-20 MED ORDER — LACTATED RINGERS IV BOLUS
500.0000 mL | Freq: Once | INTRAVENOUS | Status: AC
  Administered 2023-07-20: 500 mL via INTRAVENOUS

## 2023-07-20 MED ORDER — DEXAMETHASONE SODIUM PHOSPHATE 10 MG/ML IJ SOLN: INTRAMUSCULAR | Status: DC | PRN

## 2023-07-20 MED ORDER — MENTHOL 3 MG MT LOZG: 1.0000 | LOZENGE | OROMUCOSAL | Status: DC | PRN

## 2023-07-20 MED ORDER — OXYCODONE HCL 5 MG PO TABS
5.0000 mg | ORAL_TABLET | ORAL | Status: DC | PRN
  Administered 2023-07-20 – 2023-07-21 (×2): 5 mg via ORAL
  Filled 2023-07-20: qty 2

## 2023-07-20 MED ORDER — KETAMINE HCL 50 MG/5ML IJ SOSY
PREFILLED_SYRINGE | INTRAMUSCULAR | Status: AC
  Filled 2023-07-20: qty 5

## 2023-07-20 MED ORDER — PROPOFOL 500 MG/50ML IV EMUL
INTRAVENOUS | Status: DC | PRN
  Administered 2023-07-20: 100 ug/kg/min via INTRAVENOUS
  Administered 2023-07-20 (×2): 100 mg via INTRAVENOUS

## 2023-07-20 MED ORDER — MELOXICAM 15 MG PO TABS
15.0000 mg | ORAL_TABLET | Freq: Every day | ORAL | 0 refills | Status: AC
Start: 2023-07-20 — End: 2023-08-04

## 2023-07-20 MED ORDER — SUCCINYLCHOLINE CHLORIDE 200 MG/10ML IV SOSY
PREFILLED_SYRINGE | INTRAVENOUS | Status: DC | PRN
  Administered 2023-07-20: 160 mg via INTRAVENOUS
  Administered 2023-07-20: 40 mg via INTRAVENOUS

## 2023-07-20 MED ORDER — ORAL CARE MOUTH RINSE: 15.0000 mL | Freq: Once | OROMUCOSAL | Status: AC

## 2023-07-20 MED ORDER — KETOROLAC TROMETHAMINE 15 MG/ML IJ SOLN
7.5000 mg | Freq: Four times a day (QID) | INTRAMUSCULAR | Status: AC
  Administered 2023-07-20 – 2023-07-21 (×4): 7.5 mg via INTRAVENOUS
  Filled 2023-07-20 (×2): qty 1

## 2023-07-20 MED ORDER — PANTOPRAZOLE SODIUM 40 MG PO TBEC
40.0000 mg | DELAYED_RELEASE_TABLET | Freq: Every day | ORAL | Status: DC
  Administered 2023-07-20 – 2023-07-21 (×2): 40 mg via ORAL
  Filled 2023-07-20 (×2): qty 1

## 2023-07-20 MED ORDER — DOCUSATE SODIUM 100 MG PO CAPS
100.0000 mg | ORAL_CAPSULE | Freq: Two times a day (BID) | ORAL | Status: DC
  Administered 2023-07-20 – 2023-07-21 (×2): 100 mg via ORAL
  Filled 2023-07-20 (×2): qty 1

## 2023-07-20 MED ORDER — POVIDONE-IODINE 10 % EX SWAB
2.0000 | Freq: Once | CUTANEOUS | Status: AC
  Administered 2023-07-20: 2 via TOPICAL

## 2023-07-20 MED ORDER — AMLODIPINE BESYLATE 5 MG PO TABS
5.0000 mg | ORAL_TABLET | Freq: Every day | ORAL | Status: DC
  Administered 2023-07-21: 5 mg via ORAL
  Filled 2023-07-20: qty 1

## 2023-07-20 MED ORDER — ROSUVASTATIN CALCIUM 10 MG PO TABS
10.0000 mg | ORAL_TABLET | Freq: Every evening | ORAL | Status: DC
  Administered 2023-07-20: 10 mg via ORAL
  Filled 2023-07-20: qty 1

## 2023-07-20 MED ORDER — 0.9 % SODIUM CHLORIDE (POUR BTL) OPTIME
TOPICAL | Status: DC | PRN
  Administered 2023-07-20: 1000 mL

## 2023-07-20 MED ORDER — PHENOL 1.4 % MT LIQD: 1.0000 | OROMUCOSAL | Status: DC | PRN

## 2023-07-20 MED ORDER — DEXMEDETOMIDINE HCL IN NACL 80 MCG/20ML IV SOLN
INTRAVENOUS | Status: AC
  Filled 2023-07-20: qty 20

## 2023-07-20 MED ORDER — BUPIVACAINE-EPINEPHRINE (PF) 0.5% -1:200000 IJ SOLN
INTRAMUSCULAR | Status: AC
  Filled 2023-07-20: qty 30

## 2023-07-20 MED ORDER — FENTANYL CITRATE (PF) 100 MCG/2ML IJ SOLN
INTRAMUSCULAR | Status: AC
  Filled 2023-07-20: qty 2

## 2023-07-20 MED ORDER — FENTANYL CITRATE PF 50 MCG/ML IJ SOSY
PREFILLED_SYRINGE | INTRAMUSCULAR | Status: AC
Start: 2023-07-20 — End: 2023-07-20
  Filled 2023-07-20: qty 1

## 2023-07-20 MED ORDER — ACETAMINOPHEN 500 MG PO TABS
ORAL_TABLET | ORAL | Status: AC
  Filled 2023-07-20: qty 2

## 2023-07-20 MED ORDER — DIPHENHYDRAMINE HCL 12.5 MG/5ML PO ELIX: 12.5000 mg | ORAL_SOLUTION | ORAL | Status: DC | PRN

## 2023-07-20 MED ORDER — CEFAZOLIN SODIUM-DEXTROSE 2-4 GM/100ML-% IV SOLN
INTRAVENOUS | Status: AC
  Filled 2023-07-20: qty 100

## 2023-07-20 MED ORDER — DEXAMETHASONE SODIUM PHOSPHATE 10 MG/ML IJ SOLN
INTRAMUSCULAR | Status: AC
  Filled 2023-07-20: qty 1

## 2023-07-20 MED ORDER — ALBUTEROL SULFATE HFA 108 (90 BASE) MCG/ACT IN AERS
INHALATION_SPRAY | RESPIRATORY_TRACT | Status: AC
  Filled 2023-07-20: qty 6.7

## 2023-07-20 MED ORDER — ISOPROPYL ALCOHOL 70 % SOLN
Status: DC | PRN
  Administered 2023-07-20: 1 via TOPICAL

## 2023-07-20 MED ORDER — SODIUM CHLORIDE 0.9% FLUSH
INTRAVENOUS | Status: DC | PRN
  Administered 2023-07-20: 30 mL

## 2023-07-20 MED ORDER — OMEPRAZOLE 40 MG PO CPDR: 40.0000 mg | DELAYED_RELEASE_CAPSULE | Freq: Every day | ORAL | 0 refills | Status: DC

## 2023-07-20 MED ORDER — POLYETHYLENE GLYCOL 3350 17 G PO PACK: 17.0000 g | PACK | Freq: Every day | ORAL | 0 refills | Status: DC

## 2023-07-20 MED ORDER — PROPOFOL 10 MG/ML IV BOLUS
INTRAVENOUS | Status: DC | PRN
  Administered 2023-07-20: 160 mg via INTRAVENOUS
  Administered 2023-07-20: 40 mg via INTRAVENOUS

## 2023-07-20 MED ORDER — OXYCODONE HCL 5 MG/5ML PO SOLN: 5.0000 mg | Freq: Once | ORAL | Status: AC | PRN

## 2023-07-20 MED ORDER — ONDANSETRON HCL 4 MG/2ML IJ SOLN: 4.0000 mg | Freq: Four times a day (QID) | INTRAMUSCULAR | Status: DC | PRN

## 2023-07-20 MED ORDER — ONDANSETRON HCL 4 MG PO TABS: 4.0000 mg | ORAL_TABLET | Freq: Four times a day (QID) | ORAL | Status: DC | PRN

## 2023-07-20 MED ORDER — HYDROMORPHONE HCL 1 MG/ML IJ SOLN
INTRAMUSCULAR | Status: AC
  Filled 2023-07-20: qty 1

## 2023-07-20 MED ORDER — ONDANSETRON HCL 4 MG/2ML IJ SOLN
INTRAMUSCULAR | Status: DC | PRN
  Administered 2023-07-20: 4 mg via INTRAVENOUS

## 2023-07-20 MED ORDER — BUPIVACAINE-EPINEPHRINE (PF) 0.25% -1:200000 IJ SOLN
INTRAMUSCULAR | Status: AC
  Filled 2023-07-20: qty 30

## 2023-07-20 MED ORDER — CEFAZOLIN SODIUM-DEXTROSE 2-4 GM/100ML-% IV SOLN
2.0000 g | INTRAVENOUS | Status: AC
  Administered 2023-07-20: 2 g via INTRAVENOUS
  Filled 2023-07-20: qty 100

## 2023-07-20 SURGICAL SUPPLY — 54 items
BAG COUNTER SPONGE SURGICOUNT (BAG) IMPLANT
BLADE SAG 18X100X1.27 (BLADE) ×1 IMPLANT
BLADE SAW SAG 35X64 .89 (BLADE) ×1 IMPLANT
BLADE SAW SGTL 11.0X1.19X90.0M (BLADE) IMPLANT
BNDG COHESIVE 3X5 TAN ST LF (GAUZE/BANDAGES/DRESSINGS) ×1 IMPLANT
BNDG ELASTIC 6X10 VLCR STRL LF (GAUZE/BANDAGES/DRESSINGS) ×1 IMPLANT
BOWL SMART MIX CTS (DISPOSABLE) ×1 IMPLANT
CEMENT BONE R 1X40 (Cement) IMPLANT
CEMENT BONE REFOBACIN R1X40 US (Cement) IMPLANT
CHLORAPREP W/TINT 26 (MISCELLANEOUS) ×2 IMPLANT
COMP FEM KNEE STD PS 9 LT (Joint) ×1 IMPLANT
COMP PATELLA PEG 3 32 (Joint) ×1 IMPLANT
COMPONENT FEM KNEE STD PS 9 LT (Joint) IMPLANT
COMPONENT PATELLA PEG 3 32 (Joint) IMPLANT
COVER SURGICAL LIGHT HANDLE (MISCELLANEOUS) ×1 IMPLANT
CUFF TRNQT CYL 34X4.125X (TOURNIQUET CUFF) ×1 IMPLANT
DERMABOND ADVANCED .7 DNX12 (GAUZE/BANDAGES/DRESSINGS) ×1 IMPLANT
DRAPE INCISE IOBAN 85X60 (DRAPES) ×1 IMPLANT
DRAPE SHEET LG 3/4 BI-LAMINATE (DRAPES) ×1 IMPLANT
DRAPE U-SHAPE 47X51 STRL (DRAPES) ×1 IMPLANT
DRSG AQUACEL AG ADV 3.5X10 (GAUZE/BANDAGES/DRESSINGS) ×1 IMPLANT
ELECT REM PT RETURN 15FT ADLT (MISCELLANEOUS) ×1 IMPLANT
GAUZE SPONGE 4X4 12PLY STRL (GAUZE/BANDAGES/DRESSINGS) ×1 IMPLANT
GLOVE BIO SURGEON STRL SZ 6.5 (GLOVE) ×2 IMPLANT
GLOVE BIOGEL PI IND STRL 6.5 (GLOVE) ×1 IMPLANT
GLOVE BIOGEL PI IND STRL 8 (GLOVE) ×1 IMPLANT
GLOVE SURG ORTHO 8.0 STRL STRW (GLOVE) ×2 IMPLANT
GOWN STRL REUS W/ TWL XL LVL3 (GOWN DISPOSABLE) ×2 IMPLANT
HOLDER FOLEY CATH W/STRAP (MISCELLANEOUS) ×1 IMPLANT
HOOD PEEL AWAY T7 (MISCELLANEOUS) ×3 IMPLANT
INSERT MED AS PERS SZ 8-11 LT (Insert) IMPLANT
KIT TURNOVER KIT A (KITS) IMPLANT
MANIFOLD NEPTUNE II (INSTRUMENTS) ×1 IMPLANT
MARKER SKIN DUAL TIP RULER LAB (MISCELLANEOUS) ×1 IMPLANT
NS IRRIG 1000ML POUR BTL (IV SOLUTION) ×1 IMPLANT
PACK TOTAL KNEE CUSTOM (KITS) ×1 IMPLANT
PIN DRILL HDLS TROCAR 75 4PK (PIN) IMPLANT
SCREW HEADED 33MM KNEE (MISCELLANEOUS) IMPLANT
SET HNDPC FAN SPRY TIP SCT (DISPOSABLE) ×1 IMPLANT
SOLUTION IRRIG SURGIPHOR (IV SOLUTION) IMPLANT
SPIKE FLUID TRANSFER (MISCELLANEOUS) ×1 IMPLANT
STEM TIB PERS SZ E 5D LT (Screw) IMPLANT
STRIP CLOSURE SKIN 1/2X4 (GAUZE/BANDAGES/DRESSINGS) ×1 IMPLANT
SUT MNCRL AB 3-0 PS2 18 (SUTURE) ×1 IMPLANT
SUT STRATAFIX 0 PDS 27 VIOLET (SUTURE) ×1 IMPLANT
SUT STRATAFIX 14 PDO 48 VLT (SUTURE) ×1 IMPLANT
SUT STRATAFIX PDO 1 14 VIOLET (SUTURE) ×1 IMPLANT
SUT VIC AB 2-0 CT2 27 (SUTURE) ×2 IMPLANT
SUTURE STRATFX 0 PDS 27 VIOLET (SUTURE) ×1 IMPLANT
SYR 50ML LL SCALE MARK (SYRINGE) ×1 IMPLANT
TRAY FOLEY MTR SLVR 14FR STAT (SET/KITS/TRAYS/PACK) IMPLANT
TUBE SUCTION HIGH CAP CLEAR NV (SUCTIONS) ×1 IMPLANT
UNDERPAD 30X36 HEAVY ABSORB (UNDERPADS AND DIAPERS) ×1 IMPLANT
WRAP KNEE MAXI GEL POST OP (GAUZE/BANDAGES/DRESSINGS) IMPLANT

## 2023-07-20 NOTE — Anesthesia Procedure Notes (Signed)
 Anesthesia Regional Block: Adductor canal block   Pre-Anesthetic Checklist: , timeout performed,  Correct Patient, Correct Site, Correct Laterality,  Correct Procedure, Correct Position, site marked,  Risks and benefits discussed,  Surgical consent,  Pre-op evaluation,  At surgeon's request and post-op pain management  Laterality: Left  Prep: Maximum Sterile Barrier Precautions used, chloraprep       Needles:  Injection technique: Single-shot  Needle Type: Echogenic Needle      Needle Gauge: 20     Additional Needles:   Procedures:,,,, ultrasound used (permanent image in chart),,    Narrative:  Start time: 07/20/2023 7:00 AM End time: 07/20/2023 7:05 AM Injection made incrementally with aspirations every 5 mL.  Performed by: Personally  Anesthesiologist: Mariann Barter, MD

## 2023-07-20 NOTE — Interval H&P Note (Signed)

## 2023-07-20 NOTE — Anesthesia Postprocedure Evaluation (Signed)
 Anesthesia Post Note  Patient: Brandon Riley  Procedure(s) Performed: ARTHROPLASTY, KNEE, TOTAL (Left: Knee)     Patient location during evaluation: PACU Anesthesia Type: General Level of consciousness: awake and alert Pain management: pain level controlled Vital Signs Assessment: post-procedure vital signs reviewed and stable Respiratory status: spontaneous breathing, nonlabored ventilation, respiratory function stable and patient connected to nasal cannula oxygen Cardiovascular status: blood pressure returned to baseline and stable Postop Assessment: no apparent nausea or vomiting Anesthetic complications: no   No notable events documented.  Last Vitals:  Vitals:   07/20/23 1200 07/20/23 1230  BP: (!) 138/93 128/83  Pulse: 92 (!) 106  Resp:    Temp:    SpO2: 99%     Last Pain:  Vitals:   07/20/23 1230  TempSrc:   PainSc: 6                  Mariann Barter

## 2023-07-20 NOTE — Op Note (Signed)
 DATE OF SURGERY:  07/20/2023 TIME: 9:30 AM  PATIENT NAME:  Brandon Riley   AGE: 63 y.o.    PRE-OPERATIVE DIAGNOSIS: End-stage left knee osteoarthritis  POST-OPERATIVE DIAGNOSIS:  Same  PROCEDURE: Press-fit left total Knee Arthroplasty  SURGEON:  Mordecai Tindol A Wynema Garoutte, MD   ASSISTANT: Kathie Dike, PA-C, present and scrubbed throughout the case, critical for assistance with exposure, retraction, instrumentation, and closure.   OPERATIVE IMPLANTS:  Press-fit Zimmer persona size 9 left standard CR femur, size E left [place]seo Ti tibial baseplate, 11 mm MC poly insert, 32 mm press-fit persona patella Implant Name Type Inv. Item Serial No. Manufacturer Lot No. LRB No. Used Action  COMP PATELLA PEG 3 32 - [id number] Joint COMP PATELLA PEG 3 32  [employer](ORTH,TRAU,BIO,SG) [id number] Left 1 Implanted  COMP FEM KNEE STD PS 9 LT - [id number] Joint COMP FEM KNEE STD PS 9 LT  [employer](ORTH,TRAU,BIO,SG) [id number] Left 1 Implanted  STEM TIB PERS SZ E 5D LT - [id number] Screw STEM TIB PERS SZ E 5D LT  [employer](ORTH,TRAU,BIO,SG) [id number] Left 1 Implanted  INSERT MED AS PERS SZ 8-11 LT - [id number] Insert INSERT MED AS PERS SZ 8-11 LT  [employer]ON(ORTH,TRAU,BIO,SG) [id number] Left 1 Implanted      PREOPERATIVE INDICATIONS:  TEDDRICK MALLARI is a 63 y.o. year old male with end stage bone on bone degenerative arthritis of the knee who failed conservative treatment, including injections, antiinflammatories, activity modification, and assistive devices, and had significant impairment of their activities of daily living, and elected for Total Knee Arthroplasty.   The risks, benefits, and alternatives were discussed at length including but not limited to the risks of infection, bleeding, nerve injury, stiffness, blood clots, the need for revision surgery, cardiopulmonary complications, among others, and they were willing to proceed.  ESTIMATED BLOOD LOSS: 50cc  OPERATIVE DESCRIPTION:   Once adequate  anesthesia was induced, preoperative antibiotics, 2 gm of ancef,1 gm of Tranexamic Acid, and 8 mg of Decadron administered, the patient was positioned supine with a left thigh tourniquet placed.  The left lower extremity was prepped and draped in sterile fashion.  A time-  out was performed identifying the patient, planned procedure, and the appropriate extremity.     The leg was  exsanguinated, tourniquet elevated to 250 mmHg.  A midline incision was  made followed by median parapatellar arthrotomy. Anterior horn of the medial meniscus was released and resected. A medial release was performed, the infrapatellar fat pad was resected with care taken to protect the patellar tendon. The suprapatellar fat was removed to exposed the distal anterior femur. The anterior horn of the lateral meniscus and ACL were released.    Following initial  exposure, I first started with the femur  The femoral  canal was opened with a drill, canal was suctioned to try to prevent fat emboli.  An  intramedullary rod was passed set at 6 degrees valgus, 10 mm. The distal femur was resected.  Following this resection, the tibia was  subluxated anteriorly.  Using the extramedullary guide, 10 mm of bone was resected off   the proximal lateral tibia.  We confirmed the gap would be  stable medially and laterally with a size 10mm spacer block as well as confirmed that the tibial cut was perpendicular in the coronal plane, checking with an alignment rod.    Once this was done, the posterior femoral referencing femoral sizer was placed under to the posterior condyles with 5 degrees of external rotational which was  parallel to the transepicondylar axis and perpendicular to [employer]. The femur was sized to be a size 9 in the anterior-  posterior dimension. The  anterior, posterior, and  chamfer cuts were made without difficulty nor   notching making certain that I was along the anterior cortex to help  with flexion gap stability. Next a  laminar spreader was placed with the knee in flexion and the medial lateral menisci were resected.  5 cc of the Exparel mixture was injected in the medial side of the back of the knee and 3 cc in the lateral side.  1/2 inch curved osteotome was used to resect posterior osteophyte that was then removed with a pituitary rongeur.       At this point, the tibia was sized to be a size E.  The size E tray was  then pinned in position. Trial reduction was now carried with a 9 femur, E tibia, a 10 mm MC insert.  The knee had full extension and was stable to varus valgus stress in extension.  The knee was slightly tight in flexion and the PCL was partially released.   Attention was next directed to the patella.  Precut  measurement was noted to be 25 mm.  I resected down to 15 mm and used a  32mm patellar button to restore patellar height as well as cover the cut surface.     The patella lug holes were drilled and a 32 mm patella poly trial was placed.    The knee was brought to full extension with good flexion stability with the patella tracking through the trochlea without application of pressure.    Next the femoral component was again assessed and determined to be seated and appropriately lateralized.  The femoral lug holes were drilled.  The femoral component was then removed. Tibial component was again assessed and felt to be seated and appropriately rotated with the medial third of the tubercle. The tibia was then drilled, and keel punched.     Final components were  opened and impacted into place.   The knee was irrigated with sterile Betadine diluted in saline as well as pulse lavage normal saline. The synovial lining was  then injected a dilute Exparel with 30cc of 0.25% marcaine with epinephrine.     I confirmed that I was satisfied with the range of motion and stability, and the final 11 mm MC poly insert was chosen.  It was placed into the knee.         The tourniquet had been let down at 60  minutes.  No significant hemostasis was required.  The medial parapatellar arthrotomy was then reapproximated using #1 Stratafix sutures with the knee  in flexion.  The remaining wound was closed with 0 stratafix, 2-0 Vicryl, and running 3-0 Monocryl. The knee was cleaned, dried, dressed sterilely using Dermabond and   Aquacel dressing.  The patient was then brought to recovery room in stable condition, tolerating the procedure  well. There were no complications.   Post op recs: WB: WBAT Abx: ancef Imaging: PACU xrays DVT prophylaxis: Aspirin 81mg  BID x4 weeks Follow up: 2 weeks after surgery for a wound check with Dr. Blanchie Dessert at [facility].  Address: [address], [place], [place] [address]  Office Phone: [phone]  Weber Cooks, MD Orthopaedic Surgery

## 2023-07-20 NOTE — Anesthesia Procedure Notes (Signed)
 Spinal  Patient location during procedure: OR Start time: 07/20/2023 7:15 AM End time: 07/20/2023 7:25 AM Reason for block: surgical anesthesia Staffing Performed: anesthesiologist  Anesthesiologist: Mariann Barter, MD Performed by: Mariann Barter, MD Authorized by: Mariann Barter, MD   Preanesthetic Checklist Completed: patient identified, IV checked, site marked, risks and benefits discussed, surgical consent, monitors and equipment checked, pre-op evaluation and timeout performed Spinal Block Patient position: sitting Prep: DuraPrep Patient monitoring: heart rate, cardiac monitor, continuous pulse ox and blood pressure Approach: midline Location: L3-4 Injection technique: single-shot Needle Needle type: Sprotte  Needle gauge: 24 G Needle length: 9 cm Assessment Sensory level: T4 Events: failed spinal Additional Notes Multiple attempts at L3-4, L2-3, with both 24g and 22g spinal needles. Unable to access thecal space. Converted to [employer]

## 2023-07-20 NOTE — Anesthesia Preprocedure Evaluation (Addendum)
 Anesthesia Evaluation  Patient identified by MRN, date of birth, ID band Patient awake    Reviewed: Allergy & Precautions, NPO status , Patient's Chart, lab work & pertinent test results, reviewed documented beta blocker date and time   Airway Mallampati: III  TM Distance: >3 FB     Dental no notable dental hx.    Pulmonary sleep apnea and Continuous Positive Airway Pressure Ventilation    breath sounds clear to auscultation       Cardiovascular Exercise Tolerance: Poor hypertension, Pt. on medications (-) angina (-) CAD, (-) Cardiac Stents and (-) CABG  Rhythm:Regular Rate:Normal     Neuro/Psych neg Seizures PSYCHIATRIC DISORDERS         GI/Hepatic ,GERD  Medicated and Controlled,,(+) neg Cirrhosis      , Hepatitis -  Endo/Other  neg diabetes    Renal/GU Renal disease     Musculoskeletal  (+) Arthritis ,    Abdominal  (+) + obese  Peds  Hematology   Anesthesia Other Findings   Reproductive/Obstetrics                             Anesthesia Physical Anesthesia Plan  ASA: 2  Anesthesia Plan: Spinal   Post-op Pain Management: Regional block*   Induction: Intravenous  PONV Risk Score and Plan: 2 and Ondansetron and Propofol infusion  Airway Management Planned: Natural Airway and Nasal Cannula  Additional Equipment:   Intra-op Plan:   Post-operative Plan:   Informed Consent: I have reviewed the patients History and Physical, chart, labs and discussed the procedure including the risks, benefits and alternatives for the proposed anesthesia with the patient or authorized representative who has indicated his/her understanding and acceptance.     Dental advisory given  Plan Discussed with: CRNA  Anesthesia Plan Comments:         Anesthesia Quick Evaluation

## 2023-07-20 NOTE — Transfer of Care (Signed)
 Immediate Anesthesia Transfer of Care Note  Patient: Brandon Riley  Procedure(s) Performed: ARTHROPLASTY, KNEE, TOTAL (Left: Knee)  Patient Location: PACU  Anesthesia Type:General  Level of Consciousness: drowsy  Airway & Oxygen Therapy: Patient Spontanous Breathing and Patient connected to face mask oxygen  Post-op Assessment: Report given to RN and Post -op Vital signs reviewed and stable  Post vital signs: Reviewed and stable  Last Vitals:  Vitals Value Taken Time  BP 123/83 07/20/23 1000  Temp 36.4 C 07/20/23 1000  Pulse 87 07/20/23 1014  Resp 14 07/20/23 1000  SpO2 90 % 07/20/23 1014  Vitals shown include unfiled device data.  Last Pain:  Vitals:   07/20/23 0606  TempSrc:   PainSc: 6       Patients Stated Pain Goal: 4 (07/20/23 0556)  Complications: No notable events documented.

## 2023-07-20 NOTE — Anesthesia Procedure Notes (Addendum)
 Procedure Name: Intubation Date/Time: 07/20/2023 7:36 AM  Performed by: Maurene Capes, CRNAPre-anesthesia Checklist: Patient identified, Emergency Drugs available, Suction available and Patient being monitored Patient Re-evaluated:Patient Re-evaluated prior to induction Oxygen Delivery Method: Circle System Utilized Preoxygenation: Pre-oxygenation with 100% oxygen Induction Type: IV induction Ventilation: Mask ventilation without difficulty Laryngoscope Size: Glidescope and 4 Grade View: Grade II Tube type: Oral Tube size: 7.5 mm Number of attempts: 2 Airway Equipment and Method: Rigid stylet Placement Confirmation: ETT inserted through vocal cords under direct vision, positive ETCO2 and breath sounds checked- equal and bilateral Secured at: 23 cm Tube secured with: Tape Dental Injury: Teeth and Oropharynx as per pre-operative assessment

## 2023-07-20 NOTE — Progress Notes (Signed)
 Orthopedic Tech Progress Note Patient Details:  ASHTEN PRATS 1960-08-20 [id number]  Ortho Devices Type of Ortho Device: Bone foam zero knee Ortho Device/Splint Interventions: Ordered     Patient not yet in PACU bay, bone foam dropped off with PACU RN. Darleen Crocker 07/20/2023, 9:54 AM

## 2023-07-20 NOTE — Evaluation (Signed)
 Physical Therapy Evaluation Patient Details Name: Brandon Riley MRN: [id number] DOB: [birth year] Today's Date: 07/20/2023  History of Present Illness  63 yo male s/p L TKA on 07/20/23. PMH: Covid, RLS, OSA on BiPAP, obesity, HTN, DDD  Clinical Impression  Patient evaluated by Physical Therapy with no further acute PT needs identified. All education has been completed and the patient has no further questions.  Pt doing very well, reviewed use of ice, bonefoam, terminal knee extension/precautions and mobility as below; wife present for session. Pt is ready to d/c from PT standpoint with spouse assisting as needed   See below for any follow-up Physical Therapy or equipment needs. PT is signing off. Thank you for this referral.  * Update 3/25-pt did not d/c from PACU d/t inability to void; I&O x2 on 07/20/23. Continue PT in acute setting      If plan is discharge home, recommend the following: A little help with walking and/or transfers;A little help with bathing/dressing/bathroom;Help with stairs or ramp for entrance;Assist for transportation   Can travel by private vehicle        Equipment Recommendations Rolling walker (2 wheels)  Recommendations for Other Services       Functional Status Assessment Patient has had a recent decline in their functional status and demonstrates the ability to make significant improvements in function in a reasonable and predictable amount of time.     Precautions / Restrictions Precautions Precautions: Knee Recall of Precautions/Restrictions: Intact Restrictions Weight Bearing Restrictions Per Provider Order: No Other Position/Activity Restrictions: WBAT      Mobility  Bed Mobility Overal bed mobility: Needs Assistance Bed Mobility: Supine to Sit, Sit to Supine     Supine to sit: Supervision Sit to supine: Supervision   General bed mobility comments: for safety    Transfers Overall transfer level: Needs assistance Equipment used: Rolling  walker (2 wheels) Transfers: Sit to/from Stand Sit to Stand: Supervision           General transfer comment: repeated for instruction; cues for proper hand placement    Ambulation/Gait Ambulation/Gait assistance: Contact guard assist Gait Distance (Feet): 100 Feet Assistive device: Rolling walker (2 wheels) Gait Pattern/deviations: Step-to pattern, Wide base of support       General Gait Details: cues for sequence and RW position; wide BOS d/t body habitus  Stairs Stairs: Yes Stairs assistance: Contact guard assist, Min assist Stair Management: With walker, No rails, Step to pattern, Forwards Number of Stairs: 3 General stair comments: cues for sequence and RW position; wife present and able to assist; no knee buckling or LOB  Wheelchair Mobility     Tilt Bed    Modified Rankin (Stroke Patients Only)       Balance Overall balance assessment: No apparent balance deficits (not formally assessed)                                           Pertinent Vitals/Pain Pain Assessment Pain Assessment: Faces Faces Pain Scale: Hurts even more Pain Location: L knee Pain Descriptors / Indicators: Discomfort, Sore Pain Intervention(s): Limited activity within patient's tolerance, Monitored during session, Premedicated before session    Home Living Family/patient expects to be discharged to:: Private residence Living Arrangements: Spouse/significant other Available Help at Discharge: Family Type of Home: House Home Access: Stairs to enter   Secretary/administrator of Steps: 2   Home Layout: One level  Home Equipment: None      Prior Function Prior Level of Function : Independent/Modified Independent;Driving             Mobility Comments: retired but active; works out at least 3x.wk; has been Surveyor, 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       Extremity/Trunk Assessment   Upper Extremity Assessment Upper Extremity Assessment: Overall WFL for tasks  assessed    Lower Extremity Assessment Lower Extremity Assessment: LLE deficits/detail LLE Deficits / Details: ankle WFL, knee extension and hip flexion grossly 3/5       Communication   Communication Communication: No apparent difficulties    Cognition Arousal: Alert Behavior During Therapy: WFL for tasks assessed/performed   PT - Cognitive impairments: No apparent impairments                         Following commands: Intact       Cueing Cueing Techniques: Verbal cues     General Comments      Exercises Total Joint Exercises Ankle Circles/Pumps: AROM, Both, 5 reps Quad Sets: 5 reps, Both, AROM Heel Slides: AAROM, Left, 5 reps Straight Leg Raises: AROM, Left, 5 reps   Assessment/Plan    PT Assessment All further PT needs can be met in the next venue of care  PT Problem List         PT Treatment Interventions      PT Goals (Current goals can be found in the Care Plan section)  Acute Rehab PT Goals PT Goal Formulation: All assessment and education complete, DC therapy    Frequency       Co-evaluation               AM-PAC PT "6 Clicks" Mobility  Outcome Measure Help needed turning from your back to your side while in a flat bed without using bedrails?: None Help needed moving from lying on your back to sitting on the side of a flat bed without using bedrails?: None Help needed moving to and from a bed to a chair (including a wheelchair)?: A Little Help needed standing up from a chair using your arms (e.g., wheelchair or bedside chair)?: A Little Help needed to walk in hospital room?: A Little Help needed climbing 3-5 steps with a railing? : A Little 6 Click Score: 20    End of Session Equipment Utilized During Treatment: Gait belt Activity Tolerance: Patient tolerated treatment well Patient left: with call bell/phone within reach;in bed;with family/visitor present   PT Visit Diagnosis: Other abnormalities of gait and mobility  (R26.89)    Time: 1610-9604 PT Time Calculation (min) (ACUTE ONLY): 23 min   Charges:   PT Evaluation $PT Eval Low Complexity: 1 Low PT Treatments $Gait Training: 8-22 mins PT General Charges $$ ACUTE PT VISIT: 1 Visit         Hasten Sweitzer, PT  Acute Rehab Dept ([facility]) [phone]  07/20/2023   [facility] 07/20/2023, 3:14 PM

## 2023-07-20 NOTE — Discharge Instructions (Signed)

## 2023-07-21 ENCOUNTER — Encounter (HOSPITAL_COMMUNITY): Payer: Self-pay | Admitting: Orthopedic Surgery

## 2023-07-21 ENCOUNTER — Other Ambulatory Visit (HOSPITAL_COMMUNITY): Payer: Self-pay

## 2023-07-21 ENCOUNTER — Encounter (INDEPENDENT_AMBULATORY_CARE_PROVIDER_SITE_OTHER): Payer: Self-pay | Admitting: Sports Medicine

## 2023-07-21 DIAGNOSIS — M1712 Unilateral primary osteoarthritis, left knee: Secondary | ICD-10-CM | POA: Diagnosis not present

## 2023-07-21 DIAGNOSIS — R339 Retention of urine, unspecified: Secondary | ICD-10-CM | POA: Diagnosis not present

## 2023-07-21 LAB — CBC
HCT: 39.3 % (ref 39.0–52.0)
Hemoglobin: 13.7 g/dL (ref 13.0–17.0)
MCH: 31.8 pg (ref 26.0–34.0)
MCHC: 34.9 g/dL (ref 30.0–36.0)
MCV: 91.2 fL (ref 80.0–100.0)
Platelets: 182 10*3/uL (ref 150–400)
RBC: 4.31 MIL/uL (ref 4.22–5.81)
RDW: 12.8 % (ref 11.5–15.5)
WBC: 11.6 10*3/uL — ABNORMAL HIGH (ref 4.0–10.5)
nRBC: 0 % (ref 0.0–0.2)

## 2023-07-21 LAB — BASIC METABOLIC PANEL
Anion gap: 9 (ref 5–15)
BUN: 19 mg/dL (ref 8–23)
CO2: 20 mmol/L — ABNORMAL LOW (ref 22–32)
Calcium: 9.2 mg/dL (ref 8.9–10.3)
Chloride: 105 mmol/L (ref 98–111)
Creatinine, Ser: 0.88 mg/dL (ref 0.61–1.24)
GFR, Estimated: >60 mL/min (ref >60)
Glucose, Bld: 170 mg/dL — ABNORMAL HIGH (ref 70–99)
Potassium: 4 mmol/L (ref 3.5–5.1)
Sodium: 134 mmol/L — ABNORMAL LOW (ref 135–145)

## 2023-07-21 MED ORDER — TAMSULOSIN HCL 0.4 MG PO CAPS
0.8000 mg | ORAL_CAPSULE | Freq: Every day | ORAL | 0 refills | Status: DC
  Filled 2023-07-21: qty 14, 7d supply, fill #0

## 2023-07-21 MED ORDER — TAMSULOSIN HCL 0.4 MG PO CAPS
0.8000 mg | ORAL_CAPSULE | Freq: Every day | ORAL | Status: DC
  Administered 2023-07-21: 0.8 mg via ORAL
  Filled 2023-07-21: qty 2

## 2023-07-21 NOTE — Progress Notes (Signed)
     Subjective:  Patient reports pain as mild.  Doing great in regards to the left knee.  Unfortunately, had to be admitted overnight due to inability to void.  Had a I&O cath in the PACU for 1000 cc and overnight felt some improvement however again had retention and had to have a second I&O cath this time for 900 cc.  Will try a second dose of Flomax this morning as well as work on mobilization.  Discussed that if unable to void will likely require an indwelling catheter and follow-up with urology.  Objective:   VITALS:   Vitals:   07/20/23 2108 07/20/23 2250 07/21/23 0141 07/21/23 0520  BP: (!) 150/83  (!) 155/97 (!) 150/88  Pulse: 97 92 86 89  Resp: 17 18 17 17   Temp: 98.5 F (36.9 C)  98 F (36.7 C) 97.8 F (36.6 C)  TempSrc: Oral  Oral Oral  SpO2: 93% 94% 96% 97%  Weight:      Height:        Sensation intact distally Intact pulses distally Dorsiflexion/Plantar flexion intact Incision: dressing C/D/I Compartment soft    Lab Results  Component Value Date   WBC 11.6 (H) 07/21/2023   HGB 13.7 07/21/2023   HCT 39.3 07/21/2023   MCV 91.2 07/21/2023   PLT 182 07/21/2023   BMET    Component Value Date/Time   NA 134 (L) 07/21/2023 0344   NA 138 05/04/2023 1016   K 4.0 07/21/2023 0344   CL 105 07/21/2023 0344   CO2 20 (L) 07/21/2023 0344   GLUCOSE 170 (H) 07/21/2023 0344   BUN 19 07/21/2023 0344   BUN 17 05/04/2023 1016   CREATININE 0.88 07/21/2023 0344   CREATININE 0.94 09/30/2022 0936   CALCIUM 9.2 07/21/2023 0344   EGFR 90 05/04/2023 1016   GFRNONAA >60 07/21/2023 0344    Xray: Total knee arthroplasty components in good position no adverse features  Assessment/Plan: 1 Day Post-Op   Principal Problem:   Localized osteoarthritis of left knee  Status post left TKA 07/20/2023  Post op recs: WB: WBAT Abx: ancef Imaging: PACU xrays DVT prophylaxis: Aspirin 81mg  BID x4 weeks Follow up: 2 weeks after surgery for a wound check with Dr. Blanchie Dessert at  [facility].  Address: [address], [place], [place] [address]  Office Phone: [phone]      Joen Laura 07/21/2023, 7:18 AM   Weber Cooks, MD  Contact information:   [phone] 7am-5pm epic message Dr. Blanchie Dessert, or call office for patient follow up: [phone] After hours and holidays please check [email or web address] for group call information for [facility]

## 2023-07-21 NOTE — Discharge Summary (Signed)
 Physician Discharge Summary  Patient ID: Brandon Riley MRN: [id number] DOB/AGE: [birth year] 63 y.o.  Admit date: 07/20/2023 Discharge date: 07/21/2023  Admission Diagnoses:  Localized osteoarthritis of left knee  Discharge Diagnoses:  Principal Problem:   Localized osteoarthritis of left knee   Past Medical History:  Diagnosis Date   Arthritis    GERD (gastroesophageal reflux disease)    Hyperlipemia    Hypertension    Restless leg syndrome    Sleep apnea    w cpap    Surgeries: Procedure(s): ARTHROPLASTY, KNEE, TOTAL on 07/20/2023   Consultants (if any):   Discharged Condition: Improved  Hospital Course: Brandon Riley is an 63 y.o. male who was admitted 07/20/2023 with a diagnosis of Localized osteoarthritis of left knee and went to the operating room on 07/20/2023 and underwent the above named procedures.    Post op course complicated by urinary retention. Failed 3 voiding trials and ultimate spoke with Dr. Marlou Porch who recommended discharge with Foley catheter and follow up with urology in 5-7 days. Patient also discharged with flomax.  He was given perioperative antibiotics:  Anti-infectives (From admission, onward)    Start     Dose/Rate Route Frequency Ordered Stop   07/20/23 2230  ceFAZolin (ANCEF) IVPB 2g/100 mL premix        2 g 200 mL/hr over 30 Minutes Intravenous  Once 07/20/23 2040 07/21/23 0011   07/20/23 1507  ceFAZolin (ANCEF) 2-4 GM/100ML-% IVPB       Note to Pharmacy: Tildon Husky K: cabinet override      07/20/23 1507 07/21/23 0314   07/20/23 1400  ceFAZolin (ANCEF) IVPB 2g/100 mL premix        2 g 200 mL/hr over 30 Minutes Intravenous Every 6 hours 07/20/23 0944 07/21/23 0159   07/20/23 0600  ceFAZolin (ANCEF) IVPB 2g/100 mL premix        2 g 200 mL/hr over 30 Minutes Intravenous On call to O.R. 07/20/23 6295 07/20/23 2841     .  He was given sequential compression devices, early ambulation, and aspirin for DVT prophylaxis.  He benefited maximally  from the hospital stay and there were no complications.    Recent vital signs:  Vitals:   07/21/23 0841 07/21/23 0913  BP: (!) 148/81 120/81  Pulse: (!) 106 98  Resp:  18  Temp:  97.7 F (36.5 C)  SpO2:  97%    Recent laboratory studies:  Lab Results  Component Value Date   HGB 13.7 07/21/2023   HGB 15.0 07/13/2023   HGB 15.5 05/04/2023   Lab Results  Component Value Date   WBC 11.6 (H) 07/21/2023   PLT 182 07/21/2023   No results found for: "INR" Lab Results  Component Value Date   NA 134 (L) 07/21/2023   K 4.0 07/21/2023   CL 105 07/21/2023   CO2 20 (L) 07/21/2023   BUN 19 07/21/2023   CREATININE 0.88 07/21/2023   GLUCOSE 170 (H) 07/21/2023    Discharge Medications:   Allergies as of 07/21/2023       Reactions   Celebrex [celecoxib] Palpitations   Heavy breathing   Flexeril [cyclobenzaprine] Hives        Medication List     STOP taking these medications    naproxen sodium 220 MG tablet Commonly known as: Aleve       TAKE these medications    acetaminophen 500 MG tablet Commonly known as: TYLENOL Take 2 tablets (1,000 mg total) by mouth every 8 (  eight) hours as needed.   amLODipine 5 MG tablet Commonly known as: NORVASC Take 1 tablet (5 mg total) by mouth daily.   aspirin EC 81 MG tablet Take 1 tablet (81 mg total) by mouth 2 (two) times daily for 28 days. Swallow whole.   Blood Pressure Cuff Misc Check blood pressure at home once or twice a day after sitting relaxed for 15 minutes   chlorhexidine 4 % external liquid Commonly known as: HIBICLENS Apply 15 mLs (1 Application total) topically as directed for 30 doses. Use as directed daily for 5 days every other week for 6 weeks.   meloxicam 15 MG tablet Commonly known as: MOBIC Take 1 tablet (15 mg total) by mouth daily for 15 days.   methocarbamol 500 MG tablet Commonly known as: ROBAXIN Take 1 tablet (500 mg total) by mouth every 8 (eight) hours as needed for up to 10 days for  muscle spasms.   MILK THISTLE EXTRACT PO Take 1 capsule by mouth daily with lunch.   mupirocin ointment 2 % Commonly known as: BACTROBAN Place 1 Application into the nose 2 (two) times daily for 60 doses. Use as directed 2 times daily for 5 days every other week for 6 weeks.   omeprazole 40 MG capsule Commonly known as: PRILOSEC Take 1 capsule (40 mg total) by mouth daily for 21 days.   ondansetron 4 MG tablet Commonly known as: Zofran Take 1 tablet (4 mg total) by mouth every 8 (eight) hours as needed for up to 14 days for nausea or vomiting.   oxyCODONE 5 MG immediate release tablet Commonly known as: Roxicodone Take 0.5-1 tablets (2.5-5 mg total) by mouth every 6 (six) hours as needed for up to 7 days for severe pain (pain score 7-10) or moderate pain (pain score 4-6). What changed:  how much to take when to take this reasons to take this   polyethylene glycol 17 g packet Commonly known as: MiraLax Take 17 g by mouth daily.   PROSTATE SUPPORT PO Take 1 capsule by mouth daily with lunch.   rOPINIRole 1 MG tablet Commonly known as: REQUIP Take 1-2 tablets with ropinirole 4mg  tablets. What changed:  how much to take how to take this when to take this additional instructions   rosuvastatin 10 MG tablet Commonly known as: CRESTOR TAKE ONE TABLET BY MOUTH DAILY What changed: when to take this   tamsulosin 0.4 MG Caps capsule Commonly known as: FLOMAX Take 2 capsules (0.8 mg total) by mouth daily after breakfast. Start taking on: July 22, 2023        Diagnostic Studies: DG Knee Left Port Result Date: 07/20/2023 CLINICAL DATA:  Postoperative left knee surgery.  PACU films. EXAM: PORTABLE LEFT KNEE - 1-2 VIEW COMPARISON:  PA and AP views of the left knee 09/12/2020 FINDINGS: Interval total left knee arthroplasty. No perihardware lucency is seen to indicate hardware failure or loosening. Expected postoperative changes including intra-articular and subcutaneous air.  Smalljoint effusion. No acute fracture or dislocation. IMPRESSION: Interval total left knee arthroplasty without evidence of hardware failure. Electronically Signed   By: Neita Garnet M.D.   On: 07/20/2023 13:45    Disposition: Discharge disposition: 01-Home or Self Care       Discharge Instructions     Call MD / Call 911   Complete by: As directed    If you experience chest pain or shortness of breath, CALL 911 and be transported to the hospital emergency room.  If you develope  a fever above 101 F, pus (white drainage) or increased drainage or redness at the wound, or calf pain, call your surgeon's office.   Call MD / Call 911   Complete by: As directed    If you experience chest pain or shortness of breath, CALL 911 and be transported to the hospital emergency room.  If you develope a fever above 101 F, pus (white drainage) or increased drainage or redness at the wound, or calf pain, call your surgeon's office.   Call MD / Call 911   Complete by: As directed    If you experience chest pain or shortness of breath, CALL 911 and be transported to the hospital emergency room.  If you develope a fever above 101 F, pus (white drainage) or increased drainage or redness at the wound, or calf pain, call your surgeon's office.   Constipation Prevention   Complete by: As directed    Drink plenty of fluids.  Prune juice may be helpful.  You may use a stool softener, such as Colace (over the counter) 100 mg twice a day.  Use MiraLax (over the counter) for constipation as needed.   Constipation Prevention   Complete by: As directed    Drink plenty of fluids.  Prune juice may be helpful.  You may use a stool softener, such as Colace (over the counter) 100 mg twice a day.  Use MiraLax (over the counter) for constipation as needed.   Constipation Prevention   Complete by: As directed    Drink plenty of fluids.  Prune juice may be helpful.  You may use a stool softener, such as Colace (over the  counter) 100 mg twice a day.  Use MiraLax (over the counter) for constipation as needed.   Diet - low sodium heart healthy   Complete by: As directed    Diet - low sodium heart healthy   Complete by: As directed    Diet - low sodium heart healthy   Complete by: As directed    Do not put a pillow under the knee. Place it under the heel.   Complete by: As directed    Do not put a pillow under the knee. Place it under the heel.   Complete by: As directed    Driving restrictions   Complete by: As directed    No driving for 2-4 weeks   Driving restrictions   Complete by: As directed    No driving for 2-4 weeks   Increase activity slowly as tolerated   Complete by: As directed    Increase activity slowly as tolerated   Complete by: As directed    Increase activity slowly as tolerated   Complete by: As directed    Post-operative opioid taper instructions:   Complete by: As directed    POST-OPERATIVE OPIOID TAPER INSTRUCTIONS: It is important to wean off of your opioid medication as soon as possible. If you do not need pain medication after your surgery it is ok to stop day one. Opioids include: Codeine, Hydrocodone(Norco, Vicodin), Oxycodone(Percocet, oxycontin) and hydromorphone amongst others.  Long term and even short term use of opiods can cause: Increased pain response Dependence Constipation Depression Respiratory depression And more.  Withdrawal symptoms can include Flu like symptoms Nausea, vomiting And more Techniques to manage these symptoms Hydrate well Eat regular healthy meals Stay active Use relaxation techniques(deep breathing, meditating, yoga) Do Not substitute Alcohol to help with tapering If you have been on opioids for less than two  weeks and do not have pain than it is ok to stop all together.  Plan to wean off of opioids This plan should start within one week post op of your joint replacement. Maintain the same interval or time between taking each dose  and first decrease the dose.  Cut the total daily intake of opioids by one tablet each day Next start to increase the time between doses. The last dose that should be eliminated is the evening dose.      Post-operative opioid taper instructions:   Complete by: As directed    POST-OPERATIVE OPIOID TAPER INSTRUCTIONS: It is important to wean off of your opioid medication as soon as possible. If you do not need pain medication after your surgery it is ok to stop day one. Opioids include: Codeine, Hydrocodone(Norco, Vicodin), Oxycodone(Percocet, oxycontin) and hydromorphone amongst others.  Long term and even short term use of opiods can cause: Increased pain response Dependence Constipation Depression Respiratory depression And more.  Withdrawal symptoms can include Flu like symptoms Nausea, vomiting And more Techniques to manage these symptoms Hydrate well Eat regular healthy meals Stay active Use relaxation techniques(deep breathing, meditating, yoga) Do Not substitute Alcohol to help with tapering If you have been on opioids for less than two weeks and do not have pain than it is ok to stop all together.  Plan to wean off of opioids This plan should start within one week post op of your joint replacement. Maintain the same interval or time between taking each dose and first decrease the dose.  Cut the total daily intake of opioids by one tablet each day Next start to increase the time between doses. The last dose that should be eliminated is the evening dose.      Post-operative opioid taper instructions:   Complete by: As directed    POST-OPERATIVE OPIOID TAPER INSTRUCTIONS: It is important to wean off of your opioid medication as soon as possible. If you do not need pain medication after your surgery it is ok to stop day one. Opioids include: Codeine, Hydrocodone(Norco, Vicodin), Oxycodone(Percocet, oxycontin) and hydromorphone amongst others.  Long term and even short  term use of opiods can cause: Increased pain response Dependence Constipation Depression Respiratory depression And more.  Withdrawal symptoms can include Flu like symptoms Nausea, vomiting And more Techniques to manage these symptoms Hydrate well Eat regular healthy meals Stay active Use relaxation techniques(deep breathing, meditating, yoga) Do Not substitute Alcohol to help with tapering If you have been on opioids for less than two weeks and do not have pain than it is ok to stop all together.  Plan to wean off of opioids This plan should start within one week post op of your joint replacement. Maintain the same interval or time between taking each dose and first decrease the dose.  Cut the total daily intake of opioids by one tablet each day Next start to increase the time between doses. The last dose that should be eliminated is the evening dose.      TED hose   Complete by: As directed    Use stockings (TED hose) for 2 weeks on both leg(s).  Then for 2 more weeks on the surgical leg.  You may remove them at night for sleeping.   TED hose   Complete by: As directed    Use stockings (TED hose) for 2 weeks on both leg(s).  Then for 2 more weeks on the surgical leg.  You may remove them at night  for sleeping.        Follow-up Information     Joen Laura, MD Follow up.   Specialty: Orthopedic Surgery Contact information: [address] [place] [address] [phone]                    Discharge Instructions      INSTRUCTIONS AFTER JOINT REPLACEMENT   Remove items at home which could result in a fall. This includes throw rugs or furniture in walking pathways ICE to the affected joint every three hours while awake for 30 minutes at a time, for at least the first 3-5 days, and then as needed for pain and swelling.  Continue to use ice for pain and swelling. You may notice swelling that will progress down to the foot and ankle.  This  is normal after surgery.  Elevate your leg when you are not up walking on it.   Continue to use the breathing machine you got in the hospital (incentive spirometer) which will help keep your temperature down.  It is common for your temperature to cycle up and down following surgery, especially at night when you are not up moving around and exerting yourself.  The breathing machine keeps your lungs expanded and your temperature down.  DIET:  As you were doing prior to hospitalization, we recommend a well-balanced diet.  DRESSING / WOUND CARE / SHOWERING:  Keep the surgical dressing until follow up.  The dressing is water proof, so you can shower without any extra covering.  IF THE DRESSING FALLS OFF or the wound gets wet inside, change the dressing with sterile gauze.  Please use good hand washing techniques before changing the dressing.  Do not use any lotions or creams on the incision until instructed by your surgeon.    ACTIVITY  Increase activity slowly as tolerated, but follow the weight bearing instructions below.   No driving for 6 weeks or until further direction given by your physician.  You cannot drive while taking narcotics.  No lifting or carrying greater than 10 lbs. until further directed by your surgeon. Avoid periods of inactivity such as sitting longer than an hour when not asleep. This helps prevent blood clots.  You may return to work once you are authorized by your doctor.   WEIGHT BEARING: Weight bearing as tolerated with assist device (walker, cane, etc) as directed, use it as long as suggested by your surgeon or therapist, typically at least 4-6 weeks.  EXERCISES  Results after joint replacement surgery are often greatly improved when you follow the exercise, range of motion and muscle strengthening exercises prescribed by your doctor. Safety measures are also important to protect the joint from further injury. Any time any of these exercises cause you to have increased pain  or swelling, decrease what you are doing until you are comfortable again and then slowly increase them. If you have problems or questions, call your caregiver or physical therapist for advice.   Rehabilitation is important following a joint replacement. After just a few days of immobilization, the muscles of the leg can become weakened and shrink (atrophy).  These exercises are designed to build up the tone and strength of the thigh and leg muscles and to improve motion. Often times heat used for twenty to thirty minutes before working out will loosen up your tissues and help with improving the range of motion but do not use heat for the first two weeks following surgery (sometimes  heat can increase post-operative swelling).   These exercises can be done on a training (exercise) mat, on the floor, on a table or on a bed. Use whatever works the best and is most comfortable for you.    Use music or television while you are exercising so that the exercises are a pleasant break in your day. This will make your life better with the exercises acting as a break in your routine that you can look forward to.   Perform all exercises about fifteen times, three times per day or as directed.  You should exercise both the operative leg and the other leg as well.  Exercises include:   Quad Sets - Tighten up the muscle on the front of the thigh (Quad) and hold for 5-10 seconds.   Straight Leg Raises - With your knee straight (if you were given a brace, keep it on), lift the leg to 60 degrees, hold for 3 seconds, and slowly lower the leg.  Perform this exercise against resistance later as your leg gets stronger.  Leg Slides: Lying on your back, slowly slide your foot toward your buttocks, bending your knee up off the floor (only go as far as is comfortable). Then slowly slide your foot back down until your leg is flat on the floor again.  Angel Wings: Lying on your back spread your legs to the side as far apart as you can  without causing discomfort.  Hamstring Strength:  Lying on your back, push your heel against the floor with your leg straight by tightening up the muscles of your buttocks.  Repeat, but this time bend your knee to a comfortable angle, and push your heel against the floor.  You may put a pillow under the heel to make it more comfortable if necessary.   A rehabilitation program following joint replacement surgery can speed recovery and prevent re-injury in the future due to weakened muscles. Contact your doctor or a physical therapist for more information on knee rehabilitation.   CONSTIPATION:  Constipation is defined medically as fewer than three stools per week and severe constipation as less than one stool per week.  Even if you have a regular bowel pattern at home, your normal regimen is likely to be disrupted due to multiple reasons following surgery.  Combination of anesthesia, postoperative narcotics, change in appetite and fluid intake all can affect your bowels.   YOU MUST use at least one of the following options; they are listed in order of increasing strength to get the job done.  They are all available over the counter, and you may need to use some, POSSIBLY even all of these options:    Drink plenty of fluids (prune juice may be helpful) and high fiber foods Colace 100 mg by mouth twice a day  Senokot for constipation as directed and as needed Dulcolax (bisacodyl), take with full glass of water  Miralax (polyethylene glycol) once or twice a day as needed.  If you have tried all these things and are unable to have a bowel movement in the first 3-4 days after surgery call either your surgeon or your primary doctor.    If you experience loose stools or diarrhea, hold the medications until you stool forms back up.  If your symptoms do not get better within 1 week or if they get worse, check with your doctor.  If you experience "the worst abdominal pain ever" or develop nausea or vomiting,  please contact the office immediately for  further recommendations for treatment.  ITCHING:  If you experience itching with your medications, try taking only a single pain pill, or even half a pain pill at a time.  You can also use Benadryl over the counter for itching or also to help with sleep.   TED HOSE STOCKINGS:  Use stockings on both legs until for at least 2 weeks or as directed by physician office. They may be removed at night for sleeping.  MEDICATIONS:  See your medication summary on the "After Visit Summary" that nursing will review with you.  You may have some home medications which will be placed on hold until you complete the course of blood thinner medication.  It is important for you to complete the blood thinner medication as prescribed.  Blood clot prevention (DVT Prophylaxis): After surgery you are at an increased risk for a blood clot. you were prescribed a blood thinner, Aspirin 81mg , to be taken twice daily for a total of 4 weeks from surgery to help reduce your risk of getting a blood clot.  Signs of a pulmonary embolus (blood clot in the lungs) include sudden short of breath, feeling lightheaded or dizzy, chest pain with a deep breath, rapid pulse rapid breathing.  Signs of a blood clot in your arms or legs include new unexplained swelling and cramping, warm, red or darkened skin around the painful area.  Please call the office or 911 right away if these signs or symptoms develop.  PRECAUTIONS:   If you experience chest pain or shortness of breath - call 911 immediately for transfer to the hospital emergency department.   If you develop a fever greater that 101 F, purulent drainage from wound, increased redness or drainage from wound, foul odor from the wound/dressing, or calf pain - CONTACT YOUR SURGEON.                                                   FOLLOW-UP APPOINTMENTS:  If you do not already have a post-op appointment, please call the office for an appointment to be  seen by your surgeon.  Guidelines for how soon to be seen are listed in your "After Visit Summary", but are typically between 2-3 weeks after surgery.  If you have a specialized bandage, you may be told to follow up 1 week after surgery.  OTHER INSTRUCTIONS:  Knee Replacement:  Do not place pillow under knee, focus on keeping the knee straight while resting.  Place foam block, curve side up under heel at all times except when walking.  DO NOT modify, tear, cut, or change the foam block in any way.  POST-OPERATIVE OPIOID TAPER INSTRUCTIONS: It is important to wean off of your opioid medication as soon as possible. If you do not need pain medication after your surgery it is ok to stop day one. Opioids include: Codeine, Hydrocodone(Norco, Vicodin), Oxycodone(Percocet, oxycontin) and hydromorphone amongst others.  Long term and even short term use of opiods can cause: Increased pain response Dependence Constipation Depression Respiratory depression And more.  Withdrawal symptoms can include Flu like symptoms Nausea, vomiting And more Techniques to manage these symptoms Hydrate well Eat regular healthy meals Stay active Use relaxation techniques(deep breathing, meditating, yoga) Do Not substitute Alcohol to help with tapering If you have been on opioids for less than two weeks and do not have pain  than it is ok to stop all together.  Plan to wean off of opioids This plan should start within one week post op of your joint replacement. Maintain the same interval or time between taking each dose and first decrease the dose.  Cut the total daily intake of opioids by one tablet each day Next start to increase the time between doses. The last dose that should be eliminated is the evening dose.   MAKE SURE YOU:  Understand these instructions.  Get help right away if you are not doing well or get worse.    Thank you for letting us be a part of your medical care team.  It is a privilege we  respect greatly.  We hope these instructions will help you stay on track for a fast and full recovery!            Signed: Samvel Zinn A Dezire Turk 07/21/2023, 12:52 PM as

## 2023-07-21 NOTE — Progress Notes (Signed)
 PTX NOTE  07/21/23 1400  PT Visit Information  Last PT Received On 07/21/23  Assistance Needed Continued progress; goals met; ready for d/c from PT standpoint  History of Present Illness 63 yo male s/p L TKA on 07/20/23. PMH: Covid, RLS, OSA on BiPAP, obesity, HTN, DDD  Precautions  Precautions Knee  Recall of Precautions/Restrictions Intact  Restrictions  Weight Bearing Restrictions Per Provider Order No  Other Position/Activity Restrictions WBAT  Pain Assessment  Pain Assessment Faces  Faces Pain Scale 4  Pain Location L knee  Pain Descriptors / Indicators Discomfort;Sore  Pain Intervention(s) Limited activity within patient's tolerance;Monitored during session;Premedicated before session;Repositioned;Ice applied  Cognition  Arousal Alert  Behavior During Therapy WFL for tasks assessed/performed  PT - Cognitive impairments No apparent impairments  Following Commands  Following commands Intact  Cueing  Cueing Techniques Verbal cues  Communication  Communication No apparent difficulties  Bed Mobility  Overal bed mobility Modified Independent  Transfers  Overall transfer level Needs assistance  Equipment used Rolling walker (2 wheels)  Transfers Sit to/from Stand  Sit to Stand Supervision  General transfer comment cues for proper hand placement  Ambulation/Gait  Ambulation/Gait assistance Contact guard assist  Gait Distance (Feet) 800 Feet  Assistive device Rolling walker (2 wheels)  Gait Pattern/deviations Step-to pattern;Wide base of support  General Gait Details cues for sequence and RW position; wide BOS d/t body habitus  Stairs Yes  Stairs assistance Contact guard assist;Min assist  Stair Management With walker;No rails;Step to pattern;Forwards  Number of Stairs 3  General stair comments cues for sequence and RW position; wife present and able to assist; no knee buckling or LOB  PT - End of Session  Equipment Utilized During Treatment Gait belt  Activity Tolerance  Patient tolerated treatment well  Patient left in chair;with call bell/phone within reach  Nurse Communication Mobility status   PT - Assessment/Plan  PT Visit Diagnosis Other abnormalities of gait and mobility (R26.89)  PT Frequency (ACUTE ONLY) 7X/week  Follow Up Recommendations Follow physician's recommendations for discharge plan and follow up therapies  Patient can return home with the following A little help with walking and/or transfers;A little help with bathing/dressing/bathroom;Help with stairs or ramp for entrance;Assist for transportation  PT equipment Rolling walker (2 wheels)  AM-PAC PT "6 Clicks" Mobility Outcome Measure (Version 2)  Help needed turning from your back to your side while in a flat bed without using bedrails? 4  Help needed moving from lying on your back to sitting on the side of a flat bed without using bedrails? 4  Help needed moving to and from a bed to a chair (including a wheelchair)? 3  Help needed standing up from a chair using your arms (e.g., wheelchair or bedside chair)? 3  Help needed to walk in hospital room? 3  Help needed climbing 3-5 steps with a railing?  3  6 Click Score 20  Consider Recommendation of Discharge To: Home with no services  PT Goal Progression  Progress towards PT goals Progressing toward goals  Acute Rehab PT Goals  PT Goal Formulation With patient  PT Time Calculation  PT Start Time (ACUTE ONLY) 1433  PT Stop Time (ACUTE ONLY) 1454  PT Time Calculation (min) (ACUTE ONLY) 21 min  PT General Charges  $$ ACUTE PT VISIT 1 Visit  PT Treatments  $Gait Training 8-22 mins

## 2023-07-21 NOTE — Progress Notes (Signed)
 Patient voiding small amounts of urine 100 cc's a couple of times, but still retaining with a bladder scan of 603 cc and I& O cath at 0400 of 900cc.

## 2023-07-21 NOTE — Progress Notes (Signed)
 Ortho discharge instructions given to patient questions ask and asnwered. Discharge med delivered to the bedside. Sharrell Ku RN

## 2023-07-21 NOTE — Progress Notes (Signed)
 Physical Therapy Treatment Patient Details Name: Brandon Riley MRN: [id number] DOB: 11/24/1960 Today's Date: 07/21/2023   History of Present Illness 63 yo male s/p L TKA on 07/20/23. PMH: Covid, RLS, OSA on BiPAP, obesity, HTN, DDD    PT Comments  Pt continues to progress with mobility, having difficulty voiding; is ready for d/c from PT standpoint, will defer to nursing for final d/c decision; continue to work with pt while he remains inpt   If plan is discharge home, recommend the following: A little help with walking and/or transfers;A little help with bathing/dressing/bathroom;Help with stairs or ramp for entrance;Assist for transportation   Can travel by private vehicle        Equipment Recommendations  Rolling walker (2 wheels)    Recommendations for Other Services       Precautions / Restrictions Precautions Precautions: Knee Recall of Precautions/Restrictions: Intact Restrictions Weight Bearing Restrictions Per Provider Order: No Other Position/Activity Restrictions: WBAT     Mobility  Bed Mobility Overal bed mobility: Modified Independent                  Transfers Overall transfer level: Needs assistance Equipment used: Rolling walker (2 wheels) Transfers: Sit to/from Stand Sit to Stand: Supervision           General transfer comment: cues for proper hand placement    Ambulation/Gait Ambulation/Gait assistance: Contact guard assist Gait Distance (Feet): 360 Feet Assistive device: Rolling walker (2 wheels) Gait Pattern/deviations: Step-to pattern, Wide base of support       General Gait Details: cues for sequence and RW position; wide BOS d/t body habitus   Stairs             Wheelchair Mobility     Tilt Bed    Modified Rankin (Stroke Patients Only)       Balance                                            Communication Communication Communication: No apparent difficulties  Cognition Arousal:  Alert Behavior During Therapy: WFL for tasks assessed/performed   PT - Cognitive impairments: No apparent impairments                         Following commands: Intact      Cueing Cueing Techniques: Verbal cues  Exercises      General Comments        Pertinent Vitals/Pain Pain Assessment Pain Assessment: Faces Pain Score: 5  Pain Location: L knee Pain Descriptors / Indicators: Discomfort, Sore Pain Intervention(s): Limited activity within patient's tolerance, Repositioned, Premedicated before session, Monitored during session    Home Living                          Prior Function            PT Goals (current goals can now be found in the care plan section) Acute Rehab PT Goals PT Goal Formulation: With patient Progress towards PT goals: Progressing toward goals    Frequency    7X/week      PT Plan      Co-evaluation              AM-PAC PT "6 Clicks" Mobility   Outcome Measure  Help needed turning from your back to your side  while in a flat bed without using bedrails?: None Help needed moving from lying on your back to sitting on the side of a flat bed without using bedrails?: None Help needed moving to and from a bed to a chair (including a wheelchair)?: A Little Help needed standing up from a chair using your arms (e.g., wheelchair or bedside chair)?: A Little Help needed to walk in hospital room?: A Little Help needed climbing 3-5 steps with a railing? : A Little 6 Click Score: 20    End of Session Equipment Utilized During Treatment: Gait belt Activity Tolerance: Patient tolerated treatment well Patient left: in chair;with call bell/phone within reach Nurse Communication: Mobility status PT Visit Diagnosis: Other abnormalities of gait and mobility (R26.89)     Time: 1032-1050 PT Time Calculation (min) (ACUTE ONLY): 18 min  Charges:    $Gait Training: 8-22 mins PT General Charges $$ ACUTE PT VISIT: 1 Visit                      Rajah Lamba, PT  Acute Rehab Dept [facility]) [phone]  07/21/2023    [facility] 07/21/2023, 11:34 AM

## 2023-07-21 NOTE — Progress Notes (Signed)
 [facility] appointment scheduled for Thursday 4/3 at 0830. Patient educated on monitoring for signs of bleeding in urine. Pink tinged from multiple straight caths before foley placement.

## 2023-07-21 NOTE — TOC Transition Note (Signed)
 Transition of Care [facility]) - Discharge Note   Patient Details  Name: Brandon Riley MRN: [id number] Date of Birth: 03/03/1961  Transition of Care [facility]) CM/SW Contact:  Howell Rucks, RN Phone Number: 07/21/2023, 9:17 AM   Clinical Narrative:  Met with pt at bedside to review dc  therapy and home DME needs, pt confirmed OPPT,  RW at bedside, no home DME needs. No TOC needs.      Final next level of care: OP Rehab Barriers to Discharge: No Barriers Identified   Patient Goals and CMS Choice Patient states their goals for this hospitalization and ongoing recovery are:: return home          Discharge Placement                       Discharge Plan and Services Additional resources added to the After Visit Summary for                                       Social Drivers of Health (SDOH) Interventions SDOH Screenings   Food Insecurity: No Food Insecurity (05/11/2023)  Housing: Low Risk  (05/11/2023)  Transportation Needs: No Transportation Needs (05/11/2023)  Alcohol Screen: Low Risk  (05/11/2023)  Depression (PHQ2-9): Low Risk  (05/26/2023)  Financial Resource Strain: Low Risk  (05/11/2023)  Physical Activity: Sufficiently Active (05/11/2023)  Social Connections: Moderately Isolated (05/11/2023)  Stress: No Stress Concern Present (05/11/2023)  Tobacco Use: Low Risk  (07/20/2023)     Readmission Risk Interventions     No data to display

## 2023-07-22 ENCOUNTER — Ambulatory Visit: Payer: 59 | Attending: Sports Medicine | Admitting: Rehabilitative and Restorative Service Providers"

## 2023-07-22 ENCOUNTER — Other Ambulatory Visit: Payer: Self-pay

## 2023-07-22 ENCOUNTER — Encounter: Payer: Self-pay | Admitting: Rehabilitative and Restorative Service Providers"

## 2023-07-22 DIAGNOSIS — R2689 Other abnormalities of gait and mobility: Secondary | ICD-10-CM | POA: Diagnosis present

## 2023-07-22 DIAGNOSIS — M25562 Pain in left knee: Secondary | ICD-10-CM | POA: Diagnosis present

## 2023-07-22 DIAGNOSIS — M6281 Muscle weakness (generalized): Secondary | ICD-10-CM | POA: Insufficient documentation

## 2023-07-22 NOTE — Telephone Encounter (Signed)

## 2023-07-22 NOTE — Therapy (Signed)
 OUTPATIENT PHYSICAL THERAPY LOWER EXTREMITY EVALUATION   Patient Name: Brandon Riley MRN: [id number] DOB:1961/04/28, 63 y.o., male Today's Date: 07/22/2023  END OF SESSION:  PT End of Session - 07/22/23 1124     Visit Number 1    Number of Visits 16    Date for PT Re-Evaluation 09/20/23    Authorization Type aetna    PT Start Time 1105    PT Stop Time 1145    PT Time Calculation (min) 40 min    Activity Tolerance Patient tolerated treatment well    Behavior During Therapy WFL for tasks assessed/performed             Past Medical History:  Diagnosis Date   Arthritis    GERD (gastroesophageal reflux disease)    Hyperlipemia    Hypertension    Restless leg syndrome    Sleep apnea    w cpap   Past Surgical History:  Procedure Laterality Date   CHONDROPLASTY Left 10/28/2018   Procedure: CHONDROPLASTY;  Surgeon: Bjorn Pippin, MD;  Location: [facility];  Service: Orthopedics;  Laterality: Left;   KNEE ARTHROSCOPY WITH MEDIAL MENISECTOMY Left 10/28/2018   Procedure: LEFT KNEE ARTHROSCOPY WITH MEDIAL MENISECTOMY;  Surgeon: Bjorn Pippin, MD;  Location: [facility];  Service: Orthopedics;  Laterality: Left;   RECONSTRUCTION OF NOSE  2019   TOTAL KNEE ARTHROPLASTY Left 07/20/2023   Procedure: ARTHROPLASTY, KNEE, TOTAL;  Surgeon: Joen Laura, MD;  Location: [facility];  Service: Orthopedics;  Laterality: Left;   WISDOM TOOTH EXTRACTION  2012   Patient Active Problem List   Diagnosis Date Noted   Localized osteoarthritis of left knee 07/20/2023   Preoperative clearance 05/12/2023   COVID-19 03/30/2023   Restless leg syndrome 10/08/2022   Dermatitis 09/01/2022   Elevated PSA 09/07/2020   Polyarthralgia 08/31/2020   Steatohepatitis 05/16/2020   Open fracture of left thumb 01/31/2020   GERD (gastroesophageal reflux disease) 11/07/2016   Primary osteoarthritis of right wrist 09/29/2016   Mood disorder (HCC) 12/17/2015   Left insertional Achilles  tendinosis 11/05/2015   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   Obstructive sleep apnea on BiPAP with periodic limb movement disorder 06/05/2014   Obesity 05/02/2013   Annual physical exam 04/04/2013   Primary osteoarthritis of both knees with pseudogout 04/04/2013   Degenerative disc disease, cervical 04/04/2013   Hyperlipidemia 04/04/2013   Benign essential hypertension 04/04/2013   Lumbar degenerative disc disease 04/04/2013    PCP: Rodney Langton MD REFERRING PROVIDER: Weber Cooks, MD REFERRING DIAG:  Diagnosis  [phone] (ICD-10-CM) - S/P total knee replacement    THERAPY DIAG:  Other abnormalities of gait and mobility  Acute pain of left knee  Muscle weakness (generalized)  Rationale for Evaluation and Treatment: Rehabilitation  ONSET DATE: 07/20/23 surgical date  SUBJECTIVE:   SUBJECTIVE STATEMENT: The patient reports "both knees are shot" having meniscus repair in the past. He expects pain and is ready to start rehab. He had 1.5 years of pain prior to surgery. His surgery was on 07/20/23 for L TKR and post surgical course was complicated by urinary retention-- he is using a foley cath at this time and has appt with urology next week. He is using ice for pain control and meds.   PERTINENT HISTORY: Covid, RLS, OSA on bipap, HTN, DDD PAIN:  Are you having pain? Yes: NPRS scale:  3/10 movement flares up to 6-7/10 Pain location: L knee Pain description: achiness, soreness -- spikes iwht movement  Aggravating factors: movement, swelling Relieving factors: pain meds, ice  PRECAUTIONS: None  WEIGHT BEARING RESTRICTIONS:  WBAT  FALLS:  Has patient fallen in last 6 months? Yes, 3--felt balance was off (works on this at [employer])  LIVING ENVIRONMENT: Lives with: lives with their spouse Lives in: House/apartment Stairs: Yes: Internal: 17, basement steps; one rail and 2 walls and External: 2 steps; none Has following equipment at home: Environmental consultant - 2  wheeled  OCCUPATION: retired, Product/process development scientist   PLOF: Independent  PATIENT GOALS: Good quality of life-- be able to do 7/8 of what I used to do.  NEXT MD VISIT: 08/04/23  OBJECTIVE:  Note: Objective measures were completed at Evaluation unless otherwise noted. PATIENT SURVEYS:  LEFS 10%  COGNITION: Overall cognitive status: Within functional limits for tasks assessed     SENSATION: WFL  EDEMA:  Circumferential: 37 on R and 44 on L  PALPATION: Edema L knee with dressing intact  LOWER EXTREMITY ROM: Active ROM Right eval Left eval  Hip flexion    Hip extension    Hip abduction    Hip adduction    Hip internal rotation    Hip external rotation    Knee flexion  AROM 75 deg heel slide  Knee extension  -9 PROM -12 AROM  Ankle dorsiflexion    Ankle plantarflexion    Ankle inversion    Ankle eversion     (Blank rows = not tested)  LOWER EXTREMITY MMT: MMT Right eval Left eval  Hip flexion    Hip extension    Hip abduction    Hip adduction    Hip internal rotation    Hip external rotation    Knee flexion    Knee extension  LAQ to -38  Ankle dorsiflexion    Ankle plantarflexion    Ankle inversion    Ankle eversion     (Blank rows = not tested)  FUNCTIONAL TESTS:  Gait speed = 1.23 ft/sec  GAIT: Distance walked: 100 ft Assistive device utilized: Environmental consultant - 2 wheeled Level of assistance: Modified independence Comments: stairs with RW since no rails at home with step to pattern and SBA for safety                                                                                                                               [facility] Adult PT Treatment:                                                DATE: 07/22/23 Therapeutic Exercise: HEP from hospital reviewed including: SAQ, hip abduction, quad set, heel slides and ankle pumps. Also recommended he begin seated heel slides   PATIENT EDUCATION:  Education details: HEP --- reviewed hospital handout-- to provide  medbridge next session Person educated: Patient Education method: Explanation  and Demonstration Education comprehension: verbalized understanding and returned demonstration  HOME EXERCISE PROGRAM: Reviewed handout from hospital today-- to add into medbridge  ASSESSMENT:  CLINICAL IMPRESSION: Patient is a 63 y.o. male who was seen today for physical therapy evaluation and treatment for L TKR on 07/20/23. He presents today with impairments in ROM, strength, edema, pain, and functional limitations s/p surgery. Patient's hospital course complicated by urinary retention. He is f/u in OP with urology. PT to progress patient to tolerance with goal of returning to prior functional status with reduced pain.    OBJECTIVE IMPAIRMENTS: Abnormal gait, decreased activity tolerance, decreased balance, decreased endurance, decreased ROM, decreased strength, hypomobility, increased edema, increased fascial restrictions, impaired flexibility, and pain.   ACTIVITY LIMITATIONS: lifting, bending, standing, squatting, stairs, transfers, bed mobility, and locomotion level  PARTICIPATION LIMITATIONS: driving and community activity  PERSONAL FACTORS: 1-2 comorbidities: HTN, h/o bilateral knee surgeries  are also affecting patient's functional outcome.   REHAB POTENTIAL: Good  CLINICAL DECISION MAKING: Stable/uncomplicated  EVALUATION COMPLEXITY: Low   GOALS: Goals reviewed with patient? Yes  SHORT TERM GOALS: Target date: 08/21/23  The patient will be indep with initial HEP Baseline: initiated at eval Goal status: INITIAL  2.  The patient will improve LEFS up to 30% to demonstrate improved functional abilities. Baseline:  10% Goal status: INITIAL  3.   The patient will report pain with walking < or equal to 3/10.  Baseline:  6-7/10 Goal status: INITIAL  4.  The patient will improve AROM L knee to 6 to 100 degrees. Baseline:  12 to 75 Goal status: INITIAL  LONG TERM GOALS: Target date:  09/20/23  The patient will be indep with progression of HEP. Baseline:  initiated at eval Goal status: INITIAL  2.  The patient will improve LEFS up to 50%% to demonstrate improved functional abilities. Baseline: 10% Goal status: INITIAL  3.  The patient will improve AROM L knee to 110 degrees flexion. Baseline:  75 degrees Goal status: INITIAL  4.  The patient will improve AROM L knee to -3 degrees full extension. Baseline:  -12 AROM Goal status: INITIAL  5.  The patient will negotiate steps mod indep with reciprocal pattern x 4 steps with one rail. Baseline:  step to pattern with walker and SBA. Goal status: INITIAL  6.  The patient will improve gait speed to > or equal to 2.5 ft/sec.  Baseline: 1.23 ft/sec Goal status: INITIAL  PLAN:  PT FREQUENCY: 2x/week  PT DURATION: 8 weeks  PLANNED INTERVENTIONS: 97164- PT Re-evaluation, 97110-Therapeutic exercises, 97530- Therapeutic activity, [id number]- Neuromuscular re-education, 97535- Self Care, 16109- Manual therapy, [phone]- Gait training, [phone]- Electrical stimulation (unattended), 97016- Vasopneumatic device, 97033- Ionotophoresis 4mg /ml Dexamethasone, Patient/Family education, Balance training, Stair training, Taping, Dry Needling, Joint mobilization, DME instructions, and Cryotherapy  PLAN FOR NEXT SESSION: Provide medbridge HEP, progress therex to tolerance. Gait training working on normalizing gait mechanics.    Gibbs Naugle, PT 07/22/2023, 3:21 PM

## 2023-07-23 DIAGNOSIS — T83038A Leakage of other indwelling urethral catheter, initial encounter: Secondary | ICD-10-CM | POA: Diagnosis not present

## 2023-07-23 DIAGNOSIS — I10 Essential (primary) hypertension: Secondary | ICD-10-CM | POA: Diagnosis not present

## 2023-07-23 DIAGNOSIS — T839XXA Unspecified complication of genitourinary prosthetic device, implant and graft, initial encounter: Secondary | ICD-10-CM | POA: Diagnosis not present

## 2023-07-23 DIAGNOSIS — Z888 Allergy status to other drugs, medicaments and biological substances status: Secondary | ICD-10-CM | POA: Diagnosis not present

## 2023-07-23 DIAGNOSIS — K219 Gastro-esophageal reflux disease without esophagitis: Secondary | ICD-10-CM | POA: Diagnosis not present

## 2023-07-23 DIAGNOSIS — X58XXXA Exposure to other specified factors, initial encounter: Secondary | ICD-10-CM | POA: Diagnosis not present

## 2023-07-23 DIAGNOSIS — Z79899 Other long term (current) drug therapy: Secondary | ICD-10-CM | POA: Diagnosis not present

## 2023-07-27 ENCOUNTER — Encounter: Payer: Self-pay | Admitting: Rehabilitative and Restorative Service Providers"

## 2023-07-27 ENCOUNTER — Ambulatory Visit: Admitting: Rehabilitative and Restorative Service Providers"

## 2023-07-27 ENCOUNTER — Other Ambulatory Visit: Payer: Self-pay | Admitting: Neurology

## 2023-07-27 DIAGNOSIS — M6281 Muscle weakness (generalized): Secondary | ICD-10-CM | POA: Diagnosis not present

## 2023-07-27 DIAGNOSIS — R2689 Other abnormalities of gait and mobility: Secondary | ICD-10-CM

## 2023-07-27 DIAGNOSIS — M25562 Pain in left knee: Secondary | ICD-10-CM

## 2023-07-27 NOTE — Therapy (Signed)
 OUTPATIENT PHYSICAL THERAPY LOWER EXTREMITY TREATMENT   Patient Name: Brandon Riley MRN: [id number] DOB:Apr 24, 1961, 63 y.o., male Today's Date: 07/27/2023  END OF SESSION:  PT End of Session - 07/27/23 1116     Visit Number 2    Number of Visits 16    Date for PT Re-Evaluation 09/20/23    Authorization Type aetna    PT Start Time 1109    PT Stop Time 1157    PT Time Calculation (min) 48 min    Activity Tolerance Patient tolerated treatment well    Behavior During Therapy WFL for tasks assessed/performed            Past Medical History:  Diagnosis Date   Arthritis    GERD (gastroesophageal reflux disease)    Hyperlipemia    Hypertension    Restless leg syndrome    Sleep apnea    w cpap   Past Surgical History:  Procedure Laterality Date   CHONDROPLASTY Left 10/28/2018   Procedure: CHONDROPLASTY;  Surgeon: Bjorn Pippin, MD;  Location: [facility];  Service: Orthopedics;  Laterality: Left;   KNEE ARTHROSCOPY WITH MEDIAL MENISECTOMY Left 10/28/2018   Procedure: LEFT KNEE ARTHROSCOPY WITH MEDIAL MENISECTOMY;  Surgeon: Bjorn Pippin, MD;  Location: [facility];  Service: Orthopedics;  Laterality: Left;   RECONSTRUCTION OF NOSE  2019   TOTAL KNEE ARTHROPLASTY Left 07/20/2023   Procedure: ARTHROPLASTY, KNEE, TOTAL;  Surgeon: Joen Laura, MD;  Location: [facility];  Service: Orthopedics;  Laterality: Left;   WISDOM TOOTH EXTRACTION  2012   Patient Active Problem List   Diagnosis Date Noted   Localized osteoarthritis of left knee 07/20/2023   Preoperative clearance 05/12/2023   COVID-19 03/30/2023   Restless leg syndrome 10/08/2022   Dermatitis 09/01/2022   Elevated PSA 09/07/2020   Polyarthralgia 08/31/2020   Steatohepatitis 05/16/2020   Open fracture of left thumb 01/31/2020   GERD (gastroesophageal reflux disease) 11/07/2016   Primary osteoarthritis of right wrist 09/29/2016   Mood disorder (HCC) 12/17/2015   Left insertional Achilles  tendinosis 11/05/2015   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   Obstructive sleep apnea on BiPAP with periodic limb movement disorder 06/05/2014   Obesity 05/02/2013   Annual physical exam 04/04/2013   Primary osteoarthritis of both knees with pseudogout 04/04/2013   Degenerative disc disease, cervical 04/04/2013   Hyperlipidemia 04/04/2013   Benign essential hypertension 04/04/2013   Lumbar degenerative disc disease 04/04/2013    PCP: Rodney Langton MD REFERRING PROVIDER: Weber Cooks, MD REFERRING DIAG:  Diagnosis  [phone] (ICD-10-CM) - S/P total knee replacement    THERAPY DIAG:  Other abnormalities of gait and mobility  Acute pain of left knee  Muscle weakness (generalized)  Rationale for Evaluation and Treatment: Rehabilitation  ONSET DATE: 07/20/23 surgical date  SUBJECTIVE:   SUBJECTIVE STATEMENT: The patient is walking some at home without the walker (pushing walker with cath bag ahead of him). His resting pain is 2/10 today.  EVAL:The patient reports "both knees are shot" having meniscus repair in the past. He expects pain and is ready to start rehab. He had 1.5 years of pain prior to surgery. His surgery was on 07/20/23 for L TKR and post surgical course was complicated by urinary retention-- he is using a foley cath at this time and has appt with urology next week. He is using ice for pain control and meds.   PERTINENT HISTORY: Covid, RLS, OSA on bipap, HTN, DDD PAIN:  Are you  having pain? Yes: NPRS scale:  2/10 movement flares up  Pain location: L knee Pain description: achiness, soreness -- spikes iwht movement Aggravating factors: movement, swelling Relieving factors: pain meds, ice  PRECAUTIONS: None  WEIGHT BEARING RESTRICTIONS:  WBAT  FALLS:  Has patient fallen in last 6 months? Yes, 3--felt balance was off (works on this at [employer])  LIVING ENVIRONMENT: Lives with: lives with their spouse Lives in: House/apartment Stairs: Yes:  Internal: 17, basement steps; one rail and 2 walls and External: 2 steps; none Has following equipment at home: Dan Humphreys - 2 wheeled  OCCUPATION: retired, Product/process development scientist   PATIENT GOALS: Good quality of life-- be able to do 7/8 of what I used to do.  NEXT MD VISIT: 08/04/23  OBJECTIVE:  Note: Objective measures were completed at Evaluation unless otherwise noted. PATIENT SURVEYS:  LEFS 10% COGNITION: Overall cognitive status: Within functional limits for tasks assessed   EDEMA:  Circumferential: 37 on R and 44 on L PALPATION: Edema L knee with dressing intact LOWER EXTREMITY ROM: Active ROM Right eval Left eval Left 07/27/23  Hip flexion     Hip extension     Hip abduction     Hip adduction     Hip internal rotation     Hip external rotation     Knee flexion  AROM 75 deg heel slide AAROM 94 deg  Knee extension  -9 PROM -12 AROM   Ankle dorsiflexion     Ankle plantarflexion     Ankle inversion     Ankle eversion      (Blank rows = not tested) LOWER EXTREMITY MMT: MMT Right eval Left eval  Hip flexion    Hip extension    Hip abduction    Hip adduction    Hip internal rotation    Hip external rotation    Knee flexion    Knee extension  LAQ to -38  Ankle dorsiflexion    Ankle plantarflexion    Ankle inversion    Ankle eversion     (Blank rows = not tested) FUNCTIONAL TESTS:  Gait speed = 1.23 ft/sec GAIT: Distance walked: 100 ft Assistive device utilized: Environmental consultant - 2 wheeled Level of assistance: Modified independence Comments: stairs with RW since no rails at home with step to pattern and SBA for safety  [facility] Adult PT Treatment:                                                DATE: 07/27/23 Therapeutic Exercise: Nu-step x 4 minutes at level 1 for warm up for knee flexion Supine Heel slides  x 5 reps  Quad sets x 3 reps with attempted SLR -- unable to lift in SLR at this time SAQ partial ROM x 5 reps Hip abduction x 5 reps Physioball rolling into knee  flexion with passive overpressure into flexion Standing Heel raises x 12 reps Mini squats near countertop  Seated Isometric knee flexion/extension with 5 second holds into physioball x 3 reps Heel slide moving into flexion Therapeutic Activity: Demonstrated sit<>stand from toilet seat Gait: Gait activities demonstrating improved knee flexion to initiate swing phase with RW Modalities: Vasopneumatic L knee x 10 minutes  [facility] Adult PT Treatment:                                                DATE: 07/22/23 Therapeutic Exercise: HEP from hospital reviewed including: SAQ, hip abduction, quad set, heel slides and ankle pumps. Also recommended he begin seated heel slides   PATIENT EDUCATION:  Education details: HEP --- reviewed hospital handout-- to provide medbridge next session Person educated: Patient Education method: Medical illustrator Education comprehension: verbalized understanding and returned demonstration  HOME EXERCISE PROGRAM: Access Code: [email or web address] URL: [email or web address] Date: 07/27/2023 Prepared by: Margretta Ditty  Exercises - Seated Heel Slide  - 2 x daily - 7 x weekly - 2 sets - 5 reps - Seated Long Arc Quad with Strap  - 1 x daily - 7 x weekly - 2 sets - 5 reps - Supine Heel Slide  - 1 x daily - 7 x weekly - 2 sets - 10 reps - Supine Quad Set  - 1 x daily - 7 x weekly - 2 sets - 10 reps - Supine Hip Abduction  - 1 x daily - 7 x weekly - 2 sets - 10 reps - Heel Raises with Counter Support  - 1 x daily - 7 x weekly - 2 sets - 12 reps - Mini Squats at Table  - 1 x daily - 7 x weekly - 2 sets - 12 reps  ASSESSMENT:  CLINICAL IMPRESSION: The patient is showing improved knee flexion during gait working towards normalizing gait. He has tightness from surgical dressing and edema that he feels is pulling anterior knee-- will continue  to increase ROM to tolerance. PT to continue developing HEP engaging quad to work towards SLR.   EVAL: Patient is a 63 y.o. male who was seen today for physical therapy evaluation and treatment for L TKR on 07/20/23. He presents today with impairments in ROM, strength, edema, pain, and functional limitations s/p surgery. Patient's hospital course complicated by urinary retention. He is f/u in OP with urology. PT to progress patient to tolerance with goal of returning to prior functional status with reduced pain.    OBJECTIVE IMPAIRMENTS: Abnormal gait, decreased activity tolerance, decreased balance, decreased endurance, decreased ROM, decreased strength, hypomobility, increased edema, increased fascial restrictions, impaired flexibility, and pain.   GOALS: Goals reviewed with patient? Yes  SHORT TERM GOALS: Target date: 08/21/23  The patient will be indep with initial HEP Baseline: initiated at eval Goal status: INITIAL  2.  The patient will improve LEFS up to 30% to demonstrate improved functional abilities. Baseline:  10% Goal status: INITIAL  3.   The patient will report pain with walking < or equal to 3/10.  Baseline:  6-7/10 Goal status: INITIAL  4.  The patient will improve AROM L knee to 6 to 100 degrees. Baseline:  12 to 75 Goal status: INITIAL  LONG TERM GOALS: Target date: 09/20/23  The patient will be indep with progression of HEP. Baseline:  initiated at eval Goal status: INITIAL  2.  The patient will improve LEFS up to 50%% to demonstrate improved functional abilities. Baseline: 10% Goal status: INITIAL  3.  The patient will improve AROM L knee to 110 degrees flexion. Baseline:  75 degrees Goal status: INITIAL  4.  The patient will improve AROM L knee to -3 degrees full extension. Baseline:  -12 AROM  Goal status: INITIAL  5.  The patient will negotiate steps mod indep with reciprocal pattern x 4 steps with one rail. Baseline:  step to pattern with walker and  SBA. Goal status: INITIAL  6.  The patient will improve gait speed to > or equal to 2.5 ft/sec.  Baseline: 1.23 ft/sec Goal status: INITIAL  PLAN:  PT FREQUENCY: 2x/week  PT DURATION: 8 weeks  PLANNED INTERVENTIONS: 97164- PT Re-evaluation, 97110-Therapeutic exercises, 97530- Therapeutic activity, [id number]- Neuromuscular re-education, 97535- Self Care, 16109- Manual therapy, [phone]- Gait training, [phone]- Electrical stimulation (unattended), 97016- Vasopneumatic device, 97033- Ionotophoresis 4mg /ml Dexamethasone, Patient/Family education, Balance training, Stair training, Taping, Dry Needling, Joint mobilization, DME instructions, and Cryotherapy  PLAN FOR NEXT SESSION: Check medbridge HEP, progress therex to tolerance. Gait training working on normalizing gait mechanics.     Johnnie Goynes, PT 07/27/2023, 3:15 PM

## 2023-07-30 ENCOUNTER — Ambulatory Visit: Attending: Sports Medicine

## 2023-07-30 DIAGNOSIS — M25562 Pain in left knee: Secondary | ICD-10-CM | POA: Diagnosis present

## 2023-07-30 DIAGNOSIS — M6281 Muscle weakness (generalized): Secondary | ICD-10-CM | POA: Diagnosis present

## 2023-07-30 DIAGNOSIS — R2689 Other abnormalities of gait and mobility: Secondary | ICD-10-CM

## 2023-07-30 DIAGNOSIS — R338 Other retention of urine: Secondary | ICD-10-CM | POA: Diagnosis not present

## 2023-07-30 DIAGNOSIS — N401 Enlarged prostate with lower urinary tract symptoms: Secondary | ICD-10-CM | POA: Diagnosis not present

## 2023-07-30 NOTE — Therapy (Signed)
 OUTPATIENT PHYSICAL THERAPY LOWER EXTREMITY TREATMENT   Patient Name: JABRIL PURSELL MRN: [id number] DOB:January 14, 1961, 63 y.o., male Today's Date: 07/30/2023  END OF SESSION:  PT End of Session - 07/30/23 1357     Visit Number 3    Number of Visits 16    Date for PT Re-Evaluation 09/20/23    Authorization Type aetna    PT Start Time 1400    PT Stop Time 1445    PT Time Calculation (min) 45 min    Activity Tolerance Patient tolerated treatment well    Behavior During Therapy WFL for tasks assessed/performed             Past Medical History:  Diagnosis Date   Arthritis    GERD (gastroesophageal reflux disease)    Hyperlipemia    Hypertension    Restless leg syndrome    Sleep apnea    w cpap   Past Surgical History:  Procedure Laterality Date   CHONDROPLASTY Left 10/28/2018   Procedure: CHONDROPLASTY;  Surgeon: Bjorn Pippin, MD;  Location: [facility];  Service: Orthopedics;  Laterality: Left;   KNEE ARTHROSCOPY WITH MEDIAL MENISECTOMY Left 10/28/2018   Procedure: LEFT KNEE ARTHROSCOPY WITH MEDIAL MENISECTOMY;  Surgeon: Bjorn Pippin, MD;  Location: [facility];  Service: Orthopedics;  Laterality: Left;   RECONSTRUCTION OF NOSE  2019   TOTAL KNEE ARTHROPLASTY Left 07/20/2023   Procedure: ARTHROPLASTY, KNEE, TOTAL;  Surgeon: Joen Laura, MD;  Location: [facility];  Service: Orthopedics;  Laterality: Left;   WISDOM TOOTH EXTRACTION  2012   Patient Active Problem List   Diagnosis Date Noted   Localized osteoarthritis of left knee 07/20/2023   Preoperative clearance 05/12/2023   COVID-19 03/30/2023   Restless leg syndrome 10/08/2022   Dermatitis 09/01/2022   Elevated PSA 09/07/2020   Polyarthralgia 08/31/2020   Steatohepatitis 05/16/2020   Open fracture of left thumb 01/31/2020   GERD (gastroesophageal reflux disease) 11/07/2016   Primary osteoarthritis of right wrist 09/29/2016   Mood disorder (HCC) 12/17/2015   Left insertional Achilles  tendinosis 11/05/2015   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   Obstructive sleep apnea on BiPAP with periodic limb movement disorder 06/05/2014   Obesity 05/02/2013   Annual physical exam 04/04/2013   Primary osteoarthritis of both knees with pseudogout 04/04/2013   Degenerative disc disease, cervical 04/04/2013   Hyperlipidemia 04/04/2013   Benign essential hypertension 04/04/2013   Lumbar degenerative disc disease 04/04/2013    PCP: Rodney Langton MD REFERRING PROVIDER: Weber Cooks, MD REFERRING DIAG:  Diagnosis  [phone] (ICD-10-CM) - S/P total knee replacement    THERAPY DIAG:  Acute pain of left knee  Other abnormalities of gait and mobility  Muscle weakness (generalized)  Rationale for Evaluation and Treatment: Rehabilitation  ONSET DATE: 07/20/23 surgical date  SUBJECTIVE:   SUBJECTIVE STATEMENT: Patient had catheter removed today. He is tired, but the knee is doing ok.   EVAL:The patient reports "both knees are shot" having meniscus repair in the past. He expects pain and is ready to start rehab. He had 1.5 years of pain prior to surgery. His surgery was on 07/20/23 for L TKR and post surgical course was complicated by urinary retention-- he is using a foley cath at this time and has appt with urology next week. He is using ice for pain control and meds.   PERTINENT HISTORY: Covid, RLS, OSA on bipap, HTN, DDD PAIN:  Are you having pain? Yes: NPRS scale:  2-3/10 movement  flares up  Pain location: L knee Pain description: achiness, soreness -- spikes iwht movement Aggravating factors: movement, swelling Relieving factors: pain meds, ice  PRECAUTIONS: None  WEIGHT BEARING RESTRICTIONS:  WBAT  FALLS:  Has patient fallen in last 6 months? Yes, 3--felt balance was off (works on this at [employer])  LIVING ENVIRONMENT: Lives with: lives with their spouse Lives in: House/apartment Stairs: Yes: Internal: 17, basement steps; one rail and 2 walls and  External: 2 steps; none Has following equipment at home: Dan Humphreys - 2 wheeled  OCCUPATION: retired, Product/process development scientist   PATIENT GOALS: Good quality of life-- be able to do 7/8 of what I used to do.  NEXT MD VISIT: 08/04/23  OBJECTIVE:  Note: Objective measures were completed at Evaluation unless otherwise noted. PATIENT SURVEYS:  LEFS 10% COGNITION: Overall cognitive status: Within functional limits for tasks assessed   EDEMA:  Circumferential: 37 on R and 44 on L PALPATION: Edema L knee with dressing intact LOWER EXTREMITY ROM: Active ROM Right eval Left eval Left 07/27/23  Hip flexion     Hip extension     Hip abduction     Hip adduction     Hip internal rotation     Hip external rotation     Knee flexion  AROM 75 deg heel slide AAROM 94 deg  Knee extension  -9 PROM -12 AROM   Ankle dorsiflexion     Ankle plantarflexion     Ankle inversion     Ankle eversion      (Blank rows = not tested) LOWER EXTREMITY MMT: MMT Right eval Left eval  Hip flexion    Hip extension    Hip abduction    Hip adduction    Hip internal rotation    Hip external rotation    Knee flexion    Knee extension  LAQ to -38  Ankle dorsiflexion    Ankle plantarflexion    Ankle inversion    Ankle eversion     (Blank rows = not tested) FUNCTIONAL TESTS:  Gait speed = 1.23 ft/sec GAIT: Distance walked: 100 ft Assistive device utilized: Environmental consultant - 2 wheeled Level of assistance: Modified independence Comments: stairs with RW since no rails at home with step to pattern and SBA for safety [facility] Adult PT Treatment:                                                DATE: 07/30/23 Therapeutic Exercise: Recumbent bike no resistance x 5 minutes rocking  Standing HS curl x 10  HEP review  Manual Therapy: Lt knee passive flexion/extension ROM  Neuromuscular re-ed: Quad sets x 5; 5 sec hold SAQ: 2 x 5 assist with concentric phase  LAQ x 10 partial range  TKE x 10 ball at wall  SLR attempted unable    Modalities: Game ready: Vasopneumatic Lt knee x 10 minutes, 34 degrees, medium compression    [facility] Adult PT Treatment:                                                DATE: 07/27/23 Therapeutic Exercise: Nu-step x 4 minutes at level 1 for warm up for knee flexion Supine Heel slides  x 5 reps  Quad sets x 3 reps with attempted SLR -- unable to lift in SLR at this time SAQ partial ROM x 5 reps Hip abduction x 5 reps Physioball rolling into knee flexion with passive overpressure into flexion Standing Heel raises x 12 reps Mini squats near countertop  Seated Isometric knee flexion/extension with 5 second holds into physioball x 3 reps Heel slide moving into flexion Therapeutic Activity: Demonstrated sit<>stand from toilet seat Gait: Gait activities demonstrating improved knee flexion to initiate swing phase with RW Modalities: Vasopneumatic L knee x 10 minutes                                                                                                               [facility] Adult PT Treatment:                                                DATE: 07/22/23 Therapeutic Exercise: HEP from hospital reviewed including: SAQ, hip abduction, quad set, heel slides and ankle pumps. Also recommended he begin seated heel slides   PATIENT EDUCATION:  Education details: HEP --- reviewed hospital handout-- to provide medbridge next session Person educated: Patient Education method: Medical illustrator Education comprehension: verbalized understanding and returned demonstration  HOME EXERCISE PROGRAM: Access Code: [email or web address] URL: [email or web address] Date: 07/27/2023 Prepared by: Margretta Ditty  Exercises - Seated Heel Slide  - 2 x daily - 7 x weekly - 2 sets - 5 reps - Seated Long Arc Quad with Strap  - 1 x daily - 7 x weekly - 2 sets - 5 reps - Supine Heel Slide  - 1 x daily - 7 x weekly - 2 sets - 10 reps - Supine Quad Set  - 1 x daily - 7 x weekly - 2 sets - 10  reps - Supine Hip Abduction  - 1 x daily - 7 x weekly - 2 sets - 10 reps - Heel Raises with Counter Support  - 1 x daily - 7 x weekly - 2 sets - 12 reps - Mini Squats at Table  - 1 x daily - 7 x weekly - 2 sets - 12 reps  ASSESSMENT:  CLINICAL IMPRESSION: Able to complete rocking on recumbent bike with good tolerance. Patient with fair VMO activation with quad set. He requires assistance with concentric phase of SAQ due to weakness and is able to complete eccentric phase for minimal reps independently. Remains unable to complete SLR.  EVAL: Patient is a 63 y.o. male who was seen today for physical therapy evaluation and treatment for L TKR on 07/20/23. He presents today with impairments in ROM, strength, edema, pain, and functional limitations s/p surgery. Patient's hospital course complicated by urinary retention. He is f/u in OP with urology. PT to progress patient to tolerance with goal of returning to prior functional status with reduced pain.    OBJECTIVE IMPAIRMENTS: Abnormal gait, decreased activity tolerance, decreased  balance, decreased endurance, decreased ROM, decreased strength, hypomobility, increased edema, increased fascial restrictions, impaired flexibility, and pain.   GOALS: Goals reviewed with patient? Yes  SHORT TERM GOALS: Target date: 08/21/23  The patient will be indep with initial HEP Baseline: initiated at eval Goal status: INITIAL  2.  The patient will improve LEFS up to 30% to demonstrate improved functional abilities. Baseline:  10% Goal status: INITIAL  3.   The patient will report pain with walking < or equal to 3/10.  Baseline:  6-7/10 Goal status: INITIAL  4.  The patient will improve AROM L knee to 6 to 100 degrees. Baseline:  12 to 75 Goal status: INITIAL  LONG TERM GOALS: Target date: 09/20/23  The patient will be indep with progression of HEP. Baseline:  initiated at eval Goal status: INITIAL  2.  The patient will improve LEFS up to 50%% to  demonstrate improved functional abilities. Baseline: 10% Goal status: INITIAL  3.  The patient will improve AROM L knee to 110 degrees flexion. Baseline:  75 degrees Goal status: INITIAL  4.  The patient will improve AROM L knee to -3 degrees full extension. Baseline:  -12 AROM Goal status: INITIAL  5.  The patient will negotiate steps mod indep with reciprocal pattern x 4 steps with one rail. Baseline:  step to pattern with walker and SBA. Goal status: INITIAL  6.  The patient will improve gait speed to > or equal to 2.5 ft/sec.  Baseline: 1.23 ft/sec Goal status: INITIAL  PLAN:  PT FREQUENCY: 2x/week  PT DURATION: 8 weeks  PLANNED INTERVENTIONS: 97164- PT Re-evaluation, 97110-Therapeutic exercises, 97530- Therapeutic activity, [id number]- Neuromuscular re-education, 97535- Self Care, 40981- Manual therapy, [id number]- Gait training, [id number]- Electrical stimulation (unattended), 97016- Vasopneumatic device, 97033- Ionotophoresis 4mg /ml Dexamethasone, Patient/Family education, Balance training, Stair training, Taping, Dry Needling, Joint mobilization, DME instructions, and Cryotherapy  PLAN FOR NEXT SESSION: Check medbridge HEP, progress therex to tolerance. Gait training working on normalizing gait mechanics.    Letitia Libra, PT, DPT, ATC 07/30/23 2:38 PM

## 2023-07-31 ENCOUNTER — Telehealth: Payer: Self-pay | Admitting: Sports Medicine

## 2023-07-31 DIAGNOSIS — R339 Retention of urine, unspecified: Secondary | ICD-10-CM | POA: Diagnosis not present

## 2023-07-31 DIAGNOSIS — R000 Tachycardia, unspecified: Secondary | ICD-10-CM | POA: Diagnosis not present

## 2023-07-31 DIAGNOSIS — R10819 Abdominal tenderness, unspecified site: Secondary | ICD-10-CM | POA: Diagnosis not present

## 2023-07-31 DIAGNOSIS — R300 Dysuria: Secondary | ICD-10-CM | POA: Diagnosis not present

## 2023-07-31 DIAGNOSIS — R109 Unspecified abdominal pain: Secondary | ICD-10-CM | POA: Diagnosis not present

## 2023-07-31 DIAGNOSIS — R1084 Generalized abdominal pain: Secondary | ICD-10-CM | POA: Diagnosis not present

## 2023-07-31 DIAGNOSIS — Z9889 Other specified postprocedural states: Secondary | ICD-10-CM | POA: Diagnosis not present

## 2023-07-31 DIAGNOSIS — I10 Essential (primary) hypertension: Secondary | ICD-10-CM | POA: Diagnosis not present

## 2023-07-31 DIAGNOSIS — R61 Generalized hyperhidrosis: Secondary | ICD-10-CM | POA: Diagnosis not present

## 2023-07-31 NOTE — Telephone Encounter (Signed)
 Clinical staff please place this referral.

## 2023-07-31 NOTE — Telephone Encounter (Signed)
 Patient was seen in ER for urinary retention and want to know if he could get a referral to Urology, to see Leanord Asal @ [facility].

## 2023-08-01 ENCOUNTER — Encounter: Payer: Self-pay | Admitting: Sports Medicine

## 2023-08-03 ENCOUNTER — Ambulatory Visit: Admitting: Rehabilitative and Restorative Service Providers"

## 2023-08-03 ENCOUNTER — Ambulatory Visit (INDEPENDENT_AMBULATORY_CARE_PROVIDER_SITE_OTHER)

## 2023-08-03 ENCOUNTER — Other Ambulatory Visit: Payer: Self-pay

## 2023-08-03 ENCOUNTER — Ambulatory Visit (INDEPENDENT_AMBULATORY_CARE_PROVIDER_SITE_OTHER): Admitting: Sports Medicine

## 2023-08-03 DIAGNOSIS — R2689 Other abnormalities of gait and mobility: Secondary | ICD-10-CM | POA: Diagnosis not present

## 2023-08-03 DIAGNOSIS — M5136 Other intervertebral disc degeneration, lumbar region with discogenic back pain only: Secondary | ICD-10-CM

## 2023-08-03 DIAGNOSIS — M5126 Other intervertebral disc displacement, lumbar region: Secondary | ICD-10-CM | POA: Diagnosis not present

## 2023-08-03 DIAGNOSIS — M25562 Pain in left knee: Secondary | ICD-10-CM

## 2023-08-03 DIAGNOSIS — M533 Sacrococcygeal disorders, not elsewhere classified: Secondary | ICD-10-CM

## 2023-08-03 DIAGNOSIS — R339 Retention of urine, unspecified: Secondary | ICD-10-CM | POA: Diagnosis not present

## 2023-08-03 DIAGNOSIS — M48061 Spinal stenosis, lumbar region without neurogenic claudication: Secondary | ICD-10-CM | POA: Diagnosis not present

## 2023-08-03 DIAGNOSIS — M5125 Other intervertebral disc displacement, thoracolumbar region: Secondary | ICD-10-CM | POA: Diagnosis not present

## 2023-08-03 DIAGNOSIS — M47816 Spondylosis without myelopathy or radiculopathy, lumbar region: Secondary | ICD-10-CM | POA: Diagnosis not present

## 2023-08-03 DIAGNOSIS — M6281 Muscle weakness (generalized): Secondary | ICD-10-CM

## 2023-08-03 NOTE — Assessment & Plan Note (Signed)
 Urinary retention after arthroplasty, currently has a Foley. He would like a referral to a different urologist.

## 2023-08-03 NOTE — Telephone Encounter (Signed)
 Referral placed.

## 2023-08-03 NOTE — Therapy (Signed)
 OUTPATIENT PHYSICAL THERAPY LOWER EXTREMITY TREATMENT   Patient Name: SUHAYB ANZALONE MRN: [id number] DOB:1960/09/30, 63 y.o., male Today's Date: 08/03/2023  END OF SESSION:  PT End of Session - 08/03/23 1109     Visit Number 4    Number of Visits 16    Date for PT Re-Evaluation 09/20/23    Authorization Type aetna    PT Start Time 1107    PT Stop Time 1157    PT Time Calculation (min) 50 min    Activity Tolerance Patient tolerated treatment well    Behavior During Therapy WFL for tasks assessed/performed             Past Medical History:  Diagnosis Date   Arthritis    GERD (gastroesophageal reflux disease)    Hyperlipemia    Hypertension    Restless leg syndrome    Sleep apnea    w cpap   Past Surgical History:  Procedure Laterality Date   CHONDROPLASTY Left 10/28/2018   Procedure: CHONDROPLASTY;  Surgeon: Bjorn Pippin, MD;  Location: [facility];  Service: Orthopedics;  Laterality: Left;   KNEE ARTHROSCOPY WITH MEDIAL MENISECTOMY Left 10/28/2018   Procedure: LEFT KNEE ARTHROSCOPY WITH MEDIAL MENISECTOMY;  Surgeon: Bjorn Pippin, MD;  Location: [facility];  Service: Orthopedics;  Laterality: Left;   RECONSTRUCTION OF NOSE  2019   TOTAL KNEE ARTHROPLASTY Left 07/20/2023   Procedure: ARTHROPLASTY, KNEE, TOTAL;  Surgeon: Joen Laura, MD;  Location: [facility];  Service: Orthopedics;  Laterality: Left;   WISDOM TOOTH EXTRACTION  2012   Patient Active Problem List   Diagnosis Date Noted   Localized osteoarthritis of left knee 07/20/2023   Preoperative clearance 05/12/2023   COVID-19 03/30/2023   Restless leg syndrome 10/08/2022   Dermatitis 09/01/2022   Elevated PSA 09/07/2020   Polyarthralgia 08/31/2020   Steatohepatitis 05/16/2020   Open fracture of left thumb 01/31/2020   GERD (gastroesophageal reflux disease) 11/07/2016   Primary osteoarthritis of right wrist 09/29/2016   Mood disorder (HCC) 12/17/2015   Left insertional Achilles  tendinosis 11/05/2015   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   Obstructive sleep apnea on BiPAP with periodic limb movement disorder 06/05/2014   Obesity 05/02/2013   Annual physical exam 04/04/2013   Primary osteoarthritis of both knees with pseudogout 04/04/2013   Degenerative disc disease, cervical 04/04/2013   Hyperlipidemia 04/04/2013   Benign essential hypertension 04/04/2013   Lumbar degenerative disc disease 04/04/2013    PCP: Rodney Langton MD REFERRING PROVIDER: Weber Cooks, MD REFERRING DIAG:  Diagnosis  [phone] (ICD-10-CM) - S/P total knee replacement    THERAPY DIAG:  Acute pain of left knee  Other abnormalities of gait and mobility  Muscle weakness (generalized)  Rationale for Evaluation and Treatment: Rehabilitation  ONSET DATE: 07/20/23 surgical date  SUBJECTIVE:   SUBJECTIVE STATEMENT: Patient had to go to the ED due to urinary retention and cath placed at this time. He has f/u with MD Patient had catheter removed today. He is tired, but the knee is doing ok.   EVAL:The patient reports "both knees are shot" having meniscus repair in the past. He expects pain and is ready to start rehab. He had 1.5 years of pain prior to surgery. His surgery was on 07/20/23 for L TKR and post surgical course was complicated by urinary retention-- he is using a foley cath at this time and has appt with urology next week. He is using ice for pain control and meds.  PERTINENT HISTORY: Covid, RLS, OSA on bipap, HTN, DDD PAIN:  Are you having pain? Yes: NPRS scale:  2-3/10 movement flares up  Pain location: L knee Pain description: achiness, soreness -- spikes iwht movement Aggravating factors: movement, swelling Relieving factors: pain meds, ice  PRECAUTIONS: None  WEIGHT BEARING RESTRICTIONS:  WBAT  FALLS:  Has patient fallen in last 6 months? Yes, 3--felt balance was off (works on this at [employer])  LIVING ENVIRONMENT: Lives with: lives with their  spouse Lives in: House/apartment Stairs: Yes: Internal: 17, basement steps; one rail and 2 walls and External: 2 steps; none Has following equipment at home: Dan Humphreys - 2 wheeled  OCCUPATION: retired, Product/process development scientist   PATIENT GOALS: Good quality of life-- be able to do 7/8 of what I used to do.  NEXT MD VISIT: 08/04/23  OBJECTIVE:  Note: Objective measures were completed at Evaluation unless otherwise noted. PATIENT SURVEYS:  LEFS 10% COGNITION: Overall cognitive status: Within functional limits for tasks assessed   EDEMA:  Circumferential: 37 on R and 44 on L PALPATION: Edema L knee with dressing intact LOWER EXTREMITY ROM: Active ROM Right eval Left eval Left 07/27/23 Left 08/03/23  Hip flexion      Hip extension      Hip abduction      Hip adduction      Hip internal rotation      Hip external rotation      Knee flexion  AROM 75 deg heel slide AAROM 94 deg AAROM 102 deg supine  Sitting AAROM 103 deg  Knee extension  -9 PROM -12 AROM  -6 deg supine  Ankle dorsiflexion      Ankle plantarflexion      Ankle inversion      Ankle eversion       (Blank rows = not tested) LOWER EXTREMITY MMT: MMT Right eval Left eval  Hip flexion    Hip extension    Hip abduction    Hip adduction    Hip internal rotation    Hip external rotation    Knee flexion    Knee extension  LAQ to -38  Ankle dorsiflexion    Ankle plantarflexion    Ankle inversion    Ankle eversion     (Blank rows = not tested) FUNCTIONAL TESTS:  Gait speed = 1.23 ft/sec GAIT: Distance walked: 100 ft Assistive device utilized: Environmental consultant - 2 wheeled Level of assistance: Modified independence Comments: stairs with RW since no rails at home with step to pattern and SBA for safety   [facility] Adult PT Treatment:                                                DATE: 08/03/23 Therapeutic Exercise: Warm up bike: partial revolutions Seated Isometric quad into physioball Isometric HS into physioball  AAROM knee  flexion Standing Knee flexion x 10 reps  Supine SLR with tactile cues for quad engagement x 10 reps (mild extensor lag noted) SLR with ER for VMO activation x 5 reps PROM flexion/extension with overpressure Quad set x 10 reps End range flexion  Knee AAROM with foot on ball Bridges x 10 reps Neuromuscular re-ed: Foam standing with lateral step downs  Foam standing with eyes open/eyes closed independently Modalities: Game ready: vasopneumatic L knee x 10 minutes, 34 degrees    [facility] Adult PT Treatment:  DATE: 07/30/23 Therapeutic Exercise: Recumbent bike no resistance x 5 minutes rocking  Standing HS curl x 10  HEP review  Manual Therapy: Lt knee passive flexion/extension ROM  Neuromuscular re-ed: Quad sets x 5; 5 sec hold SAQ: 2 x 5 assist with concentric phase  LAQ x 10 partial range  TKE x 10 ball at wall  SLR attempted unable   Modalities: Game ready: Vasopneumatic Lt knee x 10 minutes, 34 degrees, medium compression    [facility] Adult PT Treatment:                                                DATE: 07/27/23 Therapeutic Exercise: Nu-step x 4 minutes at level 1 for warm up for knee flexion Supine Heel slides  x 5 reps  Quad sets x 3 reps with attempted SLR -- unable to lift in SLR at this time SAQ partial ROM x 5 reps Hip abduction x 5 reps Physioball rolling into knee flexion with passive overpressure into flexion Standing Heel raises x 12 reps Mini squats near countertop  Seated Isometric knee flexion/extension with 5 second holds into physioball x 3 reps Heel slide moving into flexion Therapeutic Activity: Demonstrated sit<>stand from toilet seat Gait: Gait activities demonstrating improved knee flexion to initiate swing phase with RW Modalities: Vasopneumatic L knee x 10 minutes                                                                                                               [facility] Adult PT Treatment:                                                 DATE: 07/22/23 Therapeutic Exercise: HEP from hospital reviewed including: SAQ, hip abduction, quad set, heel slides and ankle pumps. Also recommended he begin seated heel slides   PATIENT EDUCATION:  Education details: HEP --- reviewed hospital handout-- to provide medbridge next session Person educated: Patient Education method: Medical illustrator Education comprehension: verbalized understanding and returned demonstration  HOME EXERCISE PROGRAM: Access Code: [email or web address] URL: [email or web address] Date: 08/03/2023 Prepared by: Margretta Ditty  Exercises - Seated Heel Slide  - 2 x daily - 7 x weekly - 2 sets - 5 reps - Seated Long Arc Quad with Strap  - 1 x daily - 7 x weekly - 2 sets - 5 reps - Supine Heel Slide  - 1 x daily - 7 x weekly - 2 sets - 10 reps - Supine Quad Set  - 1 x daily - 7 x weekly - 2 sets - 10 reps - Supine Hip Abduction  - 1 x daily - 7 x weekly - 2 sets - 10 reps - Supine Straight Leg Raises  - 1 x daily - 7 x  weekly - 2 sets - 10 reps - Heel Raises with Counter Support  - 1 x daily - 7 x weekly - 2 sets - 12 reps - Mini Squats at Table  - 1 x daily - 7 x weekly - 2 sets - 12 reps  ASSESSMENT:  CLINICAL IMPRESSION: The patient is improving AROM this week. He arrives frustrated due to continued issues with urinary retention s/p surgery. He is able to complete a SLR today with minimal extensor lag. PT added this to HEP. Patient has f/u with MD tomorrow for dressing removal and reassessment.  EVAL: Patient is a 63 y.o. male who was seen today for physical therapy evaluation and treatment for L TKR on 07/20/23. He presents today with impairments in ROM, strength, edema, pain, and functional limitations s/p surgery. Patient's hospital course complicated by urinary retention. He is f/u in OP with urology. PT to progress patient to tolerance with goal of returning to prior functional status with reduced  pain.    OBJECTIVE IMPAIRMENTS: Abnormal gait, decreased activity tolerance, decreased balance, decreased endurance, decreased ROM, decreased strength, hypomobility, increased edema, increased fascial restrictions, impaired flexibility, and pain.   GOALS: Goals reviewed with patient? Yes  SHORT TERM GOALS: Target date: 08/21/23  The patient will be indep with initial HEP Baseline: initiated at eval Goal status: INITIAL  2.  The patient will improve LEFS up to 30% to demonstrate improved functional abilities. Baseline:  10% Goal status: INITIAL  3.   The patient will report pain with walking < or equal to 3/10.  Baseline:  6-7/10 Goal status: INITIAL  4.  The patient will improve AROM L knee to 6 to 100 degrees. Baseline:  12 to 75 Goal status: INITIAL  LONG TERM GOALS: Target date: 09/20/23  The patient will be indep with progression of HEP. Baseline:  initiated at eval Goal status: INITIAL  2.  The patient will improve LEFS up to 50%% to demonstrate improved functional abilities. Baseline: 10% Goal status: INITIAL  3.  The patient will improve AROM L knee to 110 degrees flexion. Baseline:  75 degrees Goal status: INITIAL  4.  The patient will improve AROM L knee to -3 degrees full extension. Baseline:  -12 AROM Goal status: INITIAL  5.  The patient will negotiate steps mod indep with reciprocal pattern x 4 steps with one rail. Baseline:  step to pattern with walker and SBA. Goal status: INITIAL  6.  The patient will improve gait speed to > or equal to 2.5 ft/sec.  Baseline: 1.23 ft/sec Goal status: INITIAL  PLAN:  PT FREQUENCY: 2x/week  PT DURATION: 8 weeks  PLANNED INTERVENTIONS: 97164- PT Re-evaluation, 97110-Therapeutic exercises, 97530- Therapeutic activity, [id number]- Neuromuscular re-education, 97535- Self Care, 16109- Manual therapy, [id number]- Gait training, [phone]- Electrical stimulation (unattended), 97016- Vasopneumatic device, 97033- Ionotophoresis 4mg /ml  Dexamethasone, Patient/Family education, Balance training, Stair training, Taping, Dry Needling, Joint mobilization, DME instructions, and Cryotherapy  PLAN FOR NEXT SESSION: Check medbridge HEP, progress therex to tolerance. Gait training working on normalizing gait mechanics.    Aws Shere, PT 08/03/23 2:57 PM

## 2023-08-03 NOTE — Telephone Encounter (Signed)
 Patient informed that referral has been sent to referral team

## 2023-08-03 NOTE — Progress Notes (Signed)
    Procedures performed today:    None.  Independent interpretation of notes and tests performed by another provider:   None.  Brief History, Exam, Impression, and Recommendations:    Lumbar degenerative disc disease Brandon Riley recently had a knee replacement, it sounds like there was an attempted spinal anesthesia, he was forced into flexion, after several tries general anesthesia was then performed for the arthroplasty. When he woke up he had difficulty voiding, ultimately after ER visits he needed a catheter. Still has significant back pain. Although his urinary retention may be related to bladder ileus due to anesthesia and urethral trauma due to previous catheterization we do certainly need to rule out cauda equina syndrome. He does need a stat lumbar spine MRI today. What we do next will depend on his lumbar spine MRI results.  For insurance purposes he has a normal neurologic exam with the exception of some weakness in the legs, he has a neurogenic bladder that is acute in onset, some numbness in the groin.  Urinary retention Urinary retention after arthroplasty, currently has a Foley. He would like a referral to a different urologist.  Level 5 template as we considered admission.  ____________________________________________ Brandon Riley. Brandon Riley, M.D., ABFM., CAQSM., AME. Primary Care and Sports Medicine [facility]  Adjunct Professor of Family Medicine  [place] [employer]  Restaurant manager, fast food

## 2023-08-03 NOTE — Assessment & Plan Note (Addendum)
 Brandon Riley recently had a knee replacement, it sounds like there was an attempted spinal anesthesia, he was forced into flexion, after several tries general anesthesia was then performed for the arthroplasty. When he woke up he had difficulty voiding, ultimately after ER visits he needed a catheter. Still has significant back pain. Although his urinary retention may be related to bladder ileus due to anesthesia and urethral trauma due to previous catheterization we do certainly need to rule out cauda equina syndrome. He does need a stat lumbar spine MRI today. What we do next will depend on his lumbar spine MRI results.  For insurance purposes he has a normal neurologic exam with the exception of some weakness in the legs, he has a neurogenic bladder that is acute in onset, some numbness in the groin.  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  Lots of reasons to have back pain.  No change in plan for now.  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

## 2023-08-04 ENCOUNTER — Encounter: Payer: Self-pay | Admitting: Urology

## 2023-08-04 ENCOUNTER — Telehealth: Payer: Self-pay

## 2023-08-04 ENCOUNTER — Ambulatory Visit: Admitting: Urology

## 2023-08-04 VITALS — BP 140/81 | HR 89 | Ht 67.0 in | Wt 230.0 lb

## 2023-08-04 DIAGNOSIS — R339 Retention of urine, unspecified: Secondary | ICD-10-CM | POA: Diagnosis not present

## 2023-08-04 DIAGNOSIS — R972 Elevated prostate specific antigen [PSA]: Secondary | ICD-10-CM | POA: Diagnosis not present

## 2023-08-04 DIAGNOSIS — M533 Sacrococcygeal disorders, not elsewhere classified: Secondary | ICD-10-CM | POA: Insufficient documentation

## 2023-08-04 NOTE — Progress Notes (Signed)
 Assessment: 1. Urinary retention   2. Elevated PSA     Plan: I personally reviewed the patient's chart including provider notes, lab results. Recommend continuing the Foley catheter for another 4-5 days Continue tamsulosin 0.8 mg daily. Return to office on 4/14 for voiding trial Will need to discuss further evaluation of his elevated PSA after resolution of his urinary retention.   Chief Complaint:  Chief Complaint  Patient presents with   Urinary Retention    History of Present Illness:  Brandon Riley is a 63 y.o. male who is seen in consultation from Monica Becton, MD for evaluation of urinary retention. He reports no problems with urination prior to his surgery.  He previously took tamsulosin for a short period of time due to increased urinary symptoms.  However, he had discontinued this prior to his recent episode. He underwent a knee arthroplasty on 07/20/2023 and developed postoperative urinary retention.  There was an attempt at spinal anesthesia. He had a Foley catheter placed and was discharged home with a catheter and on tamsulosin.  He presented to [facility] ER on 3/27 with problems with the Foley catheter drainage.  He was found to have some small clots in the catheter.  His Foley was removed at [facility] on 07/30/2023.  He was unable to void following removal and return to the emergency room later that evening.  A Foley catheter was replaced on 07/31/2023 with return of 2000 mL of urine.  He continued on tamsulosin 0.8 mg daily.  His Foley catheter has been draining well.    He is having regular bowel movements.  No lower extremity weakness or numbness. 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  He has a history of an elevated PSA. PSA results: 10/21 1.2 5/22 6.0  23% free 6/22 6.5  28%  free 3/23 5.0  32% free 6/23 4.9  41% free 6/24 4.8  31% free 1/25 5.5  28.5% free  No prior biopsy.    Past Medical History:  Past Medical History:  Diagnosis Date   Arthritis    GERD (gastroesophageal reflux disease)    Hyperlipemia    Hypertension    Restless leg syndrome    Sleep apnea    w cpap    Past Surgical History:  Past Surgical History:  Procedure Laterality Date   CHONDROPLASTY Left 10/28/2018   Procedure: CHONDROPLASTY;  Surgeon: Bjorn Pippin, MD;  Location: [facility];  Service: Orthopedics;  Laterality: Left;   KNEE ARTHROSCOPY WITH MEDIAL MENISECTOMY Left 10/28/2018   Procedure: LEFT KNEE ARTHROSCOPY WITH MEDIAL MENISECTOMY;  Surgeon: Bjorn Pippin, MD;  Location:  [facility];  Service: Orthopedics;  Laterality: Left;   RECONSTRUCTION OF NOSE  2019   TOTAL KNEE ARTHROPLASTY Left 07/20/2023   Procedure: ARTHROPLASTY, KNEE, TOTAL;  Surgeon: Joen Laura, MD;  Location: [facility];  Service: Orthopedics;  Laterality: Left;   WISDOM TOOTH EXTRACTION  2012    Allergies:  Allergies  Allergen Reactions   Celebrex [Celecoxib] Palpitations    Heavy breathing   Flexeril [Cyclobenzaprine] Hives    Family History:  Family History  Problem Relation Age of Onset   Dementia Mother    Heart Problems Mother        Double bypass   Diabetes Mother    Other Father  Prostate Issues    Social History:  Social History   Tobacco Use   Smoking status: Never    Passive exposure: Never   Smokeless tobacco: Never  Vaping Use   Vaping status: Never Used  Substance Use Topics   Alcohol use: Yes    Comment: 2-3x per week   Drug use: Never    Review of symptoms:  Constitutional:  Negative for unexplained weight loss, night sweats, fever, chills ENT:  Negative for nose bleeds, sinus pain, painful swallowing CV:  Negative for chest pain, shortness of breath, exercise intolerance, palpitations, loss of consciousness Resp:   Negative for cough, wheezing, shortness of breath GI:  Negative for nausea, vomiting, diarrhea, bloody stools GU:  Positives noted in HPI; otherwise negative for gross hematuria, dysuria, urinary incontinence Neuro:  Negative for seizures, poor balance, limb weakness, slurred speech Psych:  Negative for lack of energy, depression, anxiety Endocrine:  Negative for polydipsia, polyuria, symptoms of hypoglycemia (dizziness, hunger, sweating) Hematologic:  Negative for anemia, purpura, petechia, prolonged or excessive bleeding, use of anticoagulants  Allergic:  Negative for difficulty breathing or choking as a result of exposure to anything; no shellfish allergy; no allergic response (rash/itch) to materials, foods  Physical exam: BP (!) 140/81   Pulse 89   Ht 5\' 7"  (1.702 m)   Wt 230 lb (104.3 kg)   BMI 36.02 kg/m  GENERAL APPEARANCE:  Well appearing, well developed, well nourished, NAD HEENT: Atraumatic, Normocephalic, oropharynx clear. NECK: Supple without lymphadenopathy or thyromegaly. LUNGS: Clear to auscultation bilaterally. HEART: Regular Rate and Rhythm without murmurs, gallops, or rubs. ABDOMEN: Soft, non-tender, No Masses. EXTREMITIES: Moves all extremities well.  Without clubbing, cyanosis, or edema. NEUROLOGIC:  Alert and oriented x 3, normal gait, CN II-XII grossly intact.  MENTAL STATUS:  Appropriate. BACK:  Non-tender to palpation.  No CVAT SKIN:  Warm, dry and intact.   GU:  foley draining clear yellow urine  Results: None

## 2023-08-04 NOTE — Assessment & Plan Note (Signed)
 Incidentally noted sacral lesion, adding MRI with and without contrast sacrum

## 2023-08-04 NOTE — Addendum Note (Signed)
 Addended by: Monica Becton on: 08/04/2023 08:50 AM   Modules accepted: Orders

## 2023-08-04 NOTE — Telephone Encounter (Signed)
 Copied from CRM [id number]. Topic: General - Other >> Aug 04, 2023 11:57 AM Tiffany H wrote: Reason for CRM: Patient called to speak with Selena Batten. Patient advised that Selena Batten left a message at 11:51.   CAL was at lunch at the time of call. Please follow up with patient.

## 2023-08-04 NOTE — Telephone Encounter (Signed)
Patient advised of results and recommendations.  

## 2023-08-06 ENCOUNTER — Ambulatory Visit: Admitting: Urology

## 2023-08-06 ENCOUNTER — Ambulatory Visit

## 2023-08-06 DIAGNOSIS — M25562 Pain in left knee: Secondary | ICD-10-CM | POA: Diagnosis not present

## 2023-08-06 DIAGNOSIS — M6281 Muscle weakness (generalized): Secondary | ICD-10-CM

## 2023-08-06 DIAGNOSIS — R2689 Other abnormalities of gait and mobility: Secondary | ICD-10-CM | POA: Diagnosis not present

## 2023-08-06 NOTE — Therapy (Signed)
 OUTPATIENT PHYSICAL THERAPY LOWER EXTREMITY TREATMENT   Patient Name: Brandon Riley MRN: [id number] DOB:Sep 20, 1960, 63 y.o., male Today's Date: 08/06/2023  END OF SESSION:  PT End of Session - 08/06/23 1228     Visit Number 5    Number of Visits 16    Date for PT Re-Evaluation 09/20/23    Authorization Type aetna    PT Start Time 1100    PT Stop Time 1155    PT Time Calculation (min) 55 min    Activity Tolerance Patient tolerated treatment well    Behavior During Therapy WFL for tasks assessed/performed              Past Medical History:  Diagnosis Date   Arthritis    GERD (gastroesophageal reflux disease)    Hyperlipemia    Hypertension    Restless leg syndrome    Sleep apnea    w cpap   Past Surgical History:  Procedure Laterality Date   CHONDROPLASTY Left 10/28/2018   Procedure: CHONDROPLASTY;  Surgeon: Bjorn Pippin, MD;  Location: [facility];  Service: Orthopedics;  Laterality: Left;   KNEE ARTHROSCOPY WITH MEDIAL MENISECTOMY Left 10/28/2018   Procedure: LEFT KNEE ARTHROSCOPY WITH MEDIAL MENISECTOMY;  Surgeon: Bjorn Pippin, MD;  Location: [facility];  Service: Orthopedics;  Laterality: Left;   RECONSTRUCTION OF NOSE  2019   TOTAL KNEE ARTHROPLASTY Left 07/20/2023   Procedure: ARTHROPLASTY, KNEE, TOTAL;  Surgeon: Joen Laura, MD;  Location: [facility];  Service: Orthopedics;  Laterality: Left;   WISDOM TOOTH EXTRACTION  2012   Patient Active Problem List   Diagnosis Date Noted   Sacral lesion 08/04/2023   Urinary retention 08/03/2023   Localized osteoarthritis of left knee 07/20/2023   Preoperative clearance 05/12/2023   COVID-19 03/30/2023   Restless leg syndrome 10/08/2022   Dermatitis 09/01/2022   Elevated PSA 09/07/2020   Polyarthralgia 08/31/2020   Steatohepatitis 05/16/2020   Open fracture of left thumb 01/31/2020   GERD (gastroesophageal reflux disease) 11/07/2016   Primary osteoarthritis of right wrist 09/29/2016    Mood disorder (HCC) 12/17/2015   Left insertional Achilles tendinosis 11/05/2015   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   Obstructive sleep apnea on BiPAP with periodic limb movement disorder 06/05/2014   Obesity 05/02/2013   Annual physical exam 04/04/2013   Primary osteoarthritis of both knees with pseudogout 04/04/2013   Degenerative disc disease, cervical 04/04/2013   Hyperlipidemia 04/04/2013   Benign essential hypertension 04/04/2013   Lumbar degenerative disc disease 04/04/2013    PCP: Rodney Langton MD REFERRING PROVIDER: Weber Cooks, MD REFERRING DIAG:  Diagnosis  [phone] (ICD-10-CM) - S/P total knee replacement    THERAPY DIAG:  Acute pain of left knee  Other abnormalities of gait and mobility  Muscle weakness (generalized)  Rationale for Evaluation and Treatment: Rehabilitation  ONSET DATE: 07/20/23 surgical date  SUBJECTIVE:   SUBJECTIVE STATEMENT: Patient returned to the clinic stating that he feels like the L knee is about the same since last week - he thinks the L knee is bending well for where he is at after surgery, but does not feel he has had any increase in ROM since last session. He is also still having difficulty with L straight leg raises. The "knot" in is L lateral quadriceps is still present and bothersome.  EVAL:The patient reports "both knees are shot" having meniscus repair in the past. He expects pain and is ready to start rehab. He had 1.5 years of  pain prior to surgery. His surgery was on 07/20/23 for L TKR and post surgical course was complicated by urinary retention-- he is using a foley cath at this time and has appt with urology next week. He is using ice for pain control and meds.   PERTINENT HISTORY: Covid, RLS, OSA on bipap, HTN, DDD PAIN:  Are you having pain? Yes: NPRS scale:  2-3/10 movement flares up  Pain location: L knee Pain description: achiness, soreness -- spikes iwht movement Aggravating factors:  movement, swelling Relieving factors: pain meds, ice  PRECAUTIONS: None  WEIGHT BEARING RESTRICTIONS:  WBAT  FALLS:  Has patient fallen in last 6 months? Yes, 3--felt balance was off (works on this at [employer])  LIVING ENVIRONMENT: Lives with: lives with their spouse Lives in: House/apartment Stairs: Yes: Internal: 17, basement steps; one rail and 2 walls and External: 2 steps; none Has following equipment at home: Dan Humphreys - 2 wheeled  OCCUPATION: retired, Product/process development scientist   PATIENT GOALS: Good quality of life-- be able to do 7/8 of what I used to do.  NEXT MD VISIT: 08/04/23  OBJECTIVE:  Note: Objective measures were completed at Evaluation unless otherwise noted. PATIENT SURVEYS:  LEFS 10% COGNITION: Overall cognitive status: Within functional limits for tasks assessed   EDEMA:  Circumferential: 37 on R and 44 on L PALPATION: Edema L knee with dressing intact LOWER EXTREMITY ROM: Active ROM Right eval Left eval Left 07/27/23 Left 08/03/23  Hip flexion      Hip extension      Hip abduction      Hip adduction      Hip internal rotation      Hip external rotation      Knee flexion  AROM 75 deg heel slide AAROM 94 deg AAROM 102 deg supine  Sitting AAROM 103 deg  Knee extension  -9 PROM -12 AROM  -6 deg supine  Ankle dorsiflexion      Ankle plantarflexion      Ankle inversion      Ankle eversion       (Blank rows = not tested) LOWER EXTREMITY MMT: MMT Right eval Left eval  Hip flexion    Hip extension    Hip abduction    Hip adduction    Hip internal rotation    Hip external rotation    Knee flexion    Knee extension  LAQ to -38  Ankle dorsiflexion    Ankle plantarflexion    Ankle inversion    Ankle eversion     (Blank rows = not tested) FUNCTIONAL TESTS:  Gait speed = 1.23 ft/sec GAIT: Distance walked: 100 ft Assistive device utilized: Environmental consultant - 2 wheeled Level of assistance: Modified independence Comments: stairs with RW since no rails at home  with step to pattern and SBA for safety  [facility] Adult PT Treatment:                                                DATE: 08/06/2023 Therapeutic Exercise: Recumbent bicycle: semi-revolutions for L knee ROM Manual Therapy: Supine: Pin and stretch soft tissue mobilization to the peripatellar tissues and proximal/distal patellar tendon into knee flexion Pin and stretch soft tissue mobilization to the distal quadriceps Pin and stretch soft tissue mobilization to the L proximal lateral quadriceps Deep pressure soft tissue mobilization to the L proximal lateral quadriceps  Modalities: Supine, legs on bolster: Game Ready, low pressure, 34 deg, x 15 min  [facility] Adult PT Treatment:                                                DATE: 08/03/23 Therapeutic Exercise: Warm up bike: partial revolutions Seated Isometric quad into physioball Isometric HS into physioball  AAROM knee flexion Standing Knee flexion x 10 reps  Supine SLR with tactile cues for quad engagement x 10 reps (mild extensor lag noted) SLR with ER for VMO activation x 5 reps PROM flexion/extension with overpressure Quad set x 10 reps End range flexion  Knee AAROM with foot on ball Bridges x 10 reps Neuromuscular re-ed: Foam standing with lateral step downs  Foam standing with eyes open/eyes closed independently Modalities: Game ready: vasopneumatic L knee x 10 minutes, 34 degrees    [facility] Adult PT Treatment:                                                DATE: 07/30/23 Therapeutic Exercise: Recumbent bike no resistance x 5 minutes rocking  Standing HS curl x 10  HEP review  Manual Therapy: Lt knee passive flexion/extension ROM  Neuromuscular re-ed: Quad sets x 5; 5 sec hold SAQ: 2 x 5 assist with concentric phase  LAQ x 10 partial range  TKE x 10 ball at wall  SLR attempted unable   Modalities: Game ready: Vasopneumatic Lt knee x 10 minutes, 34 degrees, medium compression    [facility] Adult PT Treatment:                                                 DATE: 07/27/23 Therapeutic Exercise: Nu-step x 4 minutes at level 1 for warm up for knee flexion Supine Heel slides  x 5 reps  Quad sets x 3 reps with attempted SLR -- unable to lift in SLR at this time SAQ partial ROM x 5 reps Hip abduction x 5 reps Physioball rolling into knee flexion with passive overpressure into flexion Standing Heel raises x 12 reps Mini squats near countertop  Seated Isometric knee flexion/extension with 5 second holds into physioball x 3 reps Heel slide moving into flexion Therapeutic Activity: Demonstrated sit<>stand from toilet seat Gait: Gait activities demonstrating improved knee flexion to initiate swing phase with RW Modalities: Vasopneumatic L knee x 10 minutes                                                                                                               [facility] Adult PT Treatment:  DATE: 07/22/23 Therapeutic Exercise: HEP from hospital reviewed including: SAQ, hip abduction, quad set, heel slides and ankle pumps. Also recommended he begin seated heel slides   PATIENT EDUCATION:  Education details: HEP --- reviewed hospital handout-- to provide medbridge next session Person educated: Patient Education method: Medical illustrator Education comprehension: verbalized understanding and returned demonstration  HOME EXERCISE PROGRAM: Access Code: [email or web address] URL: [email or web address] Date: 08/03/2023 Prepared by: Margretta Ditty  Exercises - Seated Heel Slide  - 2 x daily - 7 x weekly - 2 sets - 5 reps - Seated Long Arc Quad with Strap  - 1 x daily - 7 x weekly - 2 sets - 5 reps - Supine Heel Slide  - 1 x daily - 7 x weekly - 2 sets - 10 reps - Supine Quad Set  - 1 x daily - 7 x weekly - 2 sets - 10 reps - Supine Hip Abduction  - 1 x daily - 7 x weekly - 2 sets - 10 reps - Supine Straight Leg Raises  - 1 x daily - 7 x weekly - 2 sets - 10  reps - Heel Raises with Counter Support  - 1 x daily - 7 x weekly - 2 sets - 12 reps - Mini Squats at Table  - 1 x daily - 7 x weekly - 2 sets - 12 reps  ASSESSMENT:  CLINICAL IMPRESSION: Focused on improving L knee flexion ROM which was a goal for the patient. Began at around 95 degrees L knee flexion when measuring with patient noting pain near the mid-incisional area. With soft tissue treatments locally along the peripatellar region, minimal improvement in L knee flexion was noted. Working at the distal quadriceps, L knee flexion eventually reached 110 degrees with minimal effort and discomfort. Began to work the area where the patient was noting the "knot" at his L proximal lateral quadriceps - this caused L knee flexion ROM and pain with flexion ROM to worsen, but the patient felt it was overall more beneficial to continue to work on the "knot", so spent further time with soft tissue work to this region. Finished the session with the recumbent bicycle to introduce some movement into the treated musculature, followed by use of the Game Ready unit to address irritation at the L proximal/lateral quadriceps created by the treatment. Overall, though, patient felt focusing on the area of the "knot" was important even though it aggravated symptoms. Advised patient continued soreness in this area from treatment was likely over next days. Physical therapy remains indicated.  EVAL: Patient is a 63 y.o. male who was seen today for physical therapy evaluation and treatment for L TKR on 07/20/23. He presents today with impairments in ROM, strength, edema, pain, and functional limitations s/p surgery. Patient's hospital course complicated by urinary retention. He is f/u in [facility] with urology. PT to progress patient to tolerance with goal of returning to prior functional status with reduced pain.    OBJECTIVE IMPAIRMENTS: Abnormal gait, decreased activity tolerance, decreased balance, decreased endurance, decreased  ROM, decreased strength, hypomobility, increased edema, increased fascial restrictions, impaired flexibility, and pain.   GOALS: Goals reviewed with patient? Yes  SHORT TERM GOALS: Target date: 08/21/23  The patient will be indep with initial HEP Baseline: initiated at eval Goal status: INITIAL  2.  The patient will improve LEFS up to 30% to demonstrate improved functional abilities. Baseline:  10% Goal status: INITIAL  3.   The patient will report pain with walking < or  equal to 3/10.  Baseline:  6-7/10 Goal status: INITIAL  4.  The patient will improve AROM L knee to 6 to 100 degrees. Baseline:  12 to 75 Goal status: INITIAL  LONG TERM GOALS: Target date: 09/20/23  The patient will be indep with progression of HEP. Baseline:  initiated at eval Goal status: INITIAL  2.  The patient will improve LEFS up to 50%% to demonstrate improved functional abilities. Baseline: 10% Goal status: INITIAL  3.  The patient will improve AROM L knee to 110 degrees flexion. Baseline:  75 degrees Goal status: INITIAL  4.  The patient will improve AROM L knee to -3 degrees full extension. Baseline:  -12 AROM Goal status: INITIAL  5.  The patient will negotiate steps mod indep with reciprocal pattern x 4 steps with one rail. Baseline:  step to pattern with walker and SBA. Goal status: INITIAL  6.  The patient will improve gait speed to > or equal to 2.5 ft/sec.  Baseline: 1.23 ft/sec Goal status: INITIAL  PLAN:  PT FREQUENCY: 2x/week  PT DURATION: 8 weeks  PLANNED INTERVENTIONS: 97164- PT Re-evaluation, 97110-Therapeutic exercises, 97530- Therapeutic activity, [id number]- Neuromuscular re-education, 97535- Self Care, 96045- Manual therapy, [id number]- Gait training, [phone]- Electrical stimulation (unattended), 97016- Vasopneumatic device, 97033- Ionotophoresis 4mg /ml Dexamethasone, Patient/Family education, Balance training, Stair training, Taping, Dry Needling, Joint mobilization, DME  instructions, and Cryotherapy  PLAN FOR NEXT SESSION: Check medbridge HEP, progress therex to tolerance. Gait training working on normalizing gait mechanics.    Clelia Schaumann, PT 08/06/23 12:30 PM

## 2023-08-10 ENCOUNTER — Ambulatory Visit: Admitting: Urology

## 2023-08-10 ENCOUNTER — Other Ambulatory Visit: Payer: Self-pay

## 2023-08-10 ENCOUNTER — Encounter: Payer: Self-pay | Admitting: Urology

## 2023-08-10 ENCOUNTER — Emergency Department (HOSPITAL_BASED_OUTPATIENT_CLINIC_OR_DEPARTMENT_OTHER)
Admission: EM | Admit: 2023-08-10 | Discharge: 2023-08-10 | Disposition: A | Discharge status: Home / Self Care | Attending: Emergency Medicine | Admitting: Emergency Medicine

## 2023-08-10 ENCOUNTER — Ambulatory Visit

## 2023-08-10 ENCOUNTER — Encounter (HOSPITAL_BASED_OUTPATIENT_CLINIC_OR_DEPARTMENT_OTHER): Payer: Self-pay | Admitting: Emergency Medicine

## 2023-08-10 ENCOUNTER — Other Ambulatory Visit: Payer: Self-pay | Admitting: Urology

## 2023-08-10 DIAGNOSIS — Z96659 Presence of unspecified artificial knee joint: Secondary | ICD-10-CM | POA: Insufficient documentation

## 2023-08-10 DIAGNOSIS — R972 Elevated prostate specific antigen [PSA]: Secondary | ICD-10-CM

## 2023-08-10 DIAGNOSIS — Z7982 Long term (current) use of aspirin: Secondary | ICD-10-CM | POA: Insufficient documentation

## 2023-08-10 DIAGNOSIS — R339 Retention of urine, unspecified: Secondary | ICD-10-CM

## 2023-08-10 LAB — URINALYSIS, MICROSCOPIC (REFLEX): WBC, UA: NONE SEEN WBC/hpf (ref 0–5)

## 2023-08-10 LAB — CBC
HCT: 39.9 % (ref 39.0–52.0)
Hemoglobin: 14.3 g/dL (ref 13.0–17.0)
MCH: 31.1 pg (ref 26.0–34.0)
MCHC: 35.8 g/dL (ref 30.0–36.0)
MCV: 86.7 fL (ref 80.0–100.0)
Platelets: 255 10*3/uL (ref 150–400)
RBC: 4.6 MIL/uL (ref 4.22–5.81)
RDW: 12.2 % (ref 11.5–15.5)
WBC: 6.2 10*3/uL (ref 4.0–10.5)
nRBC: 0 % (ref 0.0–0.2)

## 2023-08-10 LAB — BASIC METABOLIC PANEL WITH GFR
Anion gap: 13 (ref 5–15)
BUN: 14 mg/dL (ref 8–23)
CO2: 21 mmol/L — ABNORMAL LOW (ref 22–32)
Calcium: 9.5 mg/dL (ref 8.9–10.3)
Chloride: 101 mmol/L (ref 98–111)
Creatinine, Ser: 0.88 mg/dL (ref 0.61–1.24)
GFR, Estimated: >60 mL/min (ref >60)
Glucose, Bld: 106 mg/dL — ABNORMAL HIGH (ref 70–99)
Potassium: 3.8 mmol/L (ref 3.5–5.1)
Sodium: 135 mmol/L (ref 135–145)

## 2023-08-10 LAB — URINALYSIS, ROUTINE W REFLEX MICROSCOPIC
Bilirubin Urine: NEGATIVE
Glucose, UA: NEGATIVE mg/dL
Ketones, ur: NEGATIVE mg/dL
Leukocytes,Ua: NEGATIVE
Nitrite: NEGATIVE
Protein, ur: NEGATIVE mg/dL
Specific Gravity, Urine: <=1.005 (ref 1.005–1.030)
pH: 5 (ref 5.0–8.0)

## 2023-08-10 MED ORDER — SILODOSIN 8 MG PO CAPS: 8.0000 mg | ORAL_CAPSULE | Freq: Every day | ORAL | 11 refills | Status: DC

## 2023-08-10 NOTE — Progress Notes (Signed)
 Fill and Pull Catheter Removal  Patient is present today for a catheter removal.  Patient was cleaned and prepped in a sterile fashion 225ml of sterile water/ saline was instilled into the bladder when the patient felt the urge to urinate, 10ml of water was then drained from the balloon.  A 16FR foley cath was removed from the bladder no complications were noted .  Patient as then given some time to void on their own.  Patient can void  on their own after some time.  Patient tolerated well.  Performed by: Arlys Berke., CMA(AAMA)

## 2023-08-10 NOTE — Discharge Instructions (Signed)
 The infection looks like it has resolved, unfortunately you are retaining today.  Please begin taking the new medication that Dr. Willye Harvey sent to your pharmacy, Rapaflo, you can take this instead of tamsulosin.  Finish your course of Keflex and you should receive a call from Dr. Jaquita Merl office for a follow-up this week.

## 2023-08-10 NOTE — ED Triage Notes (Signed)
 Urinary cathter on 07/31/2023. Removed this morning . Unable to empty his bladder he said . Feels leaking some drops . Obvious distress , lower abd pain and penile pain .  Bladder scanner during triage

## 2023-08-10 NOTE — ED Provider Notes (Signed)
 [place] EMERGENCY DEPARTMENT AT [facility] [place] Provider Note   CSN: [id number] Arrival date & time: 08/10/23  1410     History  No chief complaint on file.   Brandon Riley is a 63 y.o. male who has been struggling with urinary retention since knee arthroplasty on 07/16/2023.  Spinal anesthesia had been attempted at that time.  Foley catheter placed at discharge, discharged with Flomax.  In he is had multiple visits since having Foley catheter inserted, history of elevated PSA but negative biopsy in 2022.  He was just seen at urology earlier today, was able to successfully remove Foley catheter and voided 175 mL, returns to the ED with inability to void.  He is currently taking Keflex  HPI     Home Medications Prior to Admission medications   Medication Sig Start Date End Date Taking? Authorizing Provider  acetaminophen (TYLENOL) 500 MG tablet Take 2 tablets (1,000 mg total) by mouth every 8 (eight) hours as needed. 07/20/23 08/19/23  Cecil Cobbs, PA-C  aspirin EC 81 MG tablet Take 1 tablet (81 mg total) by mouth 2 (two) times daily for 28 days. Swallow whole. 07/21/23 08/18/23  Cecil Cobbs, PA-C  Blood Pressure Monitoring (BLOOD PRESSURE CUFF) MISC Check blood pressure at home once or twice a day after sitting relaxed for 15 minutes 05/12/23   Monica Becton, MD  MILK THISTLE EXTRACT PO Take 1 capsule by mouth daily with lunch.    [provider]  Misc Natural Products (PROSTATE SUPPORT PO) Take 1 capsule by mouth daily with lunch.    [provider]  rOPINIRole (REQUIP) 1 MG tablet Take 1-2 tablets with ropinirole 4mg  tablets. Patient taking differently: Take 3 mg by mouth at bedtime. 11/17/22   Nita Sickle K, DO  rosuvastatin (CRESTOR) 10 MG tablet TAKE ONE TABLET BY MOUTH DAILY Patient taking differently: Take 10 mg by mouth every evening. 09/21/22   Monica Becton, MD  silodosin (RAPAFLO) 8 MG CAPS capsule Take 1 capsule (8 mg  total) by mouth daily with breakfast. 08/10/23   Stoneking, Danford Bad., MD      Allergies    Celebrex [celecoxib] and Flexeril [cyclobenzaprine]    Review of Systems   Review of Systems  All other systems reviewed and are negative.   Physical Exam Updated Vital Signs BP (!) 158/110 (BP Location: Right Arm)   Pulse 92   Temp 98.9 F (37.2 C) (Oral)   Resp 20   Wt 105 kg   SpO2 100%   BMI 36.26 kg/m  Physical Exam Vitals and nursing note reviewed.  Constitutional:      General: He is not in acute distress.    Appearance: Normal appearance.  HENT:     Head: Normocephalic and atraumatic.  Eyes:     General:        Right eye: No discharge.        Left eye: No discharge.  Cardiovascular:     Rate and Rhythm: Normal rate and regular rhythm.     Heart sounds: No murmur heard.    No friction rub. No gallop.  Pulmonary:     Effort: Pulmonary effort is normal.     Breath sounds: Normal breath sounds.  Abdominal:     General: Bowel sounds are normal.     Palpations: Abdomen is soft.  Genitourinary:    Comments: 550 on bladder scan, patient with significant suprapubic discomfort. Skin:    General: Skin is warm  and dry.     Capillary Refill: Capillary refill takes less than 2 seconds.  Neurological:     Mental Status: He is alert and oriented to person, place, and time.  Psychiatric:        Mood and Affect: Mood normal.        Behavior: Behavior normal.     ED Results / Procedures / Treatments   Labs (all labs ordered are listed, but only abnormal results are displayed) Labs Reviewed  URINALYSIS, ROUTINE W REFLEX MICROSCOPIC - Abnormal; Notable for the following components:      Result Value   Hgb urine dipstick TRACE (*)    All other components within normal limits  BASIC METABOLIC PANEL WITH GFR - Abnormal; Notable for the following components:   CO2 21 (*)    Glucose, Bld 106 (*)    All other components within normal limits  URINALYSIS, MICROSCOPIC (REFLEX) -  Abnormal; Notable for the following components:   Bacteria, UA RARE (*)    All other components within normal limits  CBC    EKG None  Radiology No results found.  Procedures Procedures    Medications Ordered in ED Medications - No data to display  ED Course/ Medical Decision Making/ A&P                                 Medical Decision Making  This patient is a 63 y.o. male who presents to the ED for concern of urinary retention.   Differential diagnoses prior to evaluation: Acute urinary retention, urine infection, sepsis, bactermia, vs other  Past Medical History / Social History / Additional history: Chart reviewed. Pertinent results include: Extensively reviewed lab work, imaging from multiple recent visits with urology, and in hospital  Physical Exam: Physical exam performed. The pertinent findings include: Patient with suprapubic discomfort, 550 mL in bladder, relief after Foley catheter inserted.  CBC, BMP overall unremarkable, UA unremarkable, hemoglobin likely from foley trauma  Medications / Treatment: .  Speaking to Dr. Willye Harvey he prescribed him Rapaflo in place of Flomax, he will plan to follow-up in his office this week   Disposition: After consideration of the diagnostic results and the patients response to treatment, I feel that patient stable for discharge,  .   emergency department workup does not suggest an emergent condition requiring admission or immediate intervention beyond what has been performed at this time. The plan is: as above. The patient is safe for discharge and has been instructed to return immediately for worsening symptoms, change in symptoms or any other concerns.  Final Clinical Impression(s) / ED Diagnoses Final diagnoses:  Urinary retention    Rx / DC Orders ED Discharge Orders     None         Nelly Banco, PA-C 08/10/23 1518    Albertus Hughs, DO 08/11/23 [phone]

## 2023-08-10 NOTE — Progress Notes (Signed)
 Assessment: 1. Urinary retention   2. Elevated PSA; negative biopsy 10/22     Plan: Foley catheter removed after successful voiding trial today. Continue tamsulosin 0.8 mg daily. Complete current supply of cephalexin. Return to office in 2 weeks with bladder scan Continue to monitor elevated PSA with a prior negative biopsy.  Chief Complaint:  Chief Complaint  Patient presents with   Urinary Retention    History of Present Illness:  Brandon Riley is a 63 y.o. male who is seen for continued evaluation of urinary retention. He reports no problems with urination prior to his surgery.  He previously took tamsulosin for a short period of time due to increased urinary symptoms.  However, he had discontinued this prior to his recent episode. He underwent a knee arthroplasty on 07/20/2023 and developed postoperative urinary retention.  There was an attempt at spinal anesthesia. He had a Foley catheter placed and was discharged home with a catheter and on tamsulosin.  He presented to [facility] ER on 3/27 with problems with the Foley catheter drainage.  He was found to have some small clots in the catheter.  His Foley was removed at [facility] on 07/30/2023.  He was unable to void following removal and return to the emergency room later that evening.  A Foley catheter was replaced on 07/31/2023 with return of 2000 mL of urine.  He continued on tamsulosin 0.8 mg daily.  His Foley catheter was draining well.    He is having regular bowel movements.  No lower extremity weakness or numbness. 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  He has a history of an elevated PSA. PSA results: 10/21 1.2 5/22 6.0  23% free 6/22 6.5  28% free 3/23 5.0  32% free 6/23 4.9  41% free 6/24 4.8  31% free 1/25 5.5  28.5% free  He underwent a  prostate biopsy in October 2022 by Dr. Leanord Asal with [facility].  Pathology showed benign prostate tissue with atrophy and associated mixed inflammation.  He continued on tamsulosin.  He presents today for a voiding trial.  Portions of the above documentation were copied from a prior visit for review purposes only.   Past Medical History:  Past Medical History:  Diagnosis Date   Arthritis    GERD (gastroesophageal reflux disease)    Hyperlipemia    Hypertension    Restless leg syndrome    Sleep apnea    w cpap    Past Surgical History:  Past Surgical History:  Procedure Laterality Date   CHONDROPLASTY Left 10/28/2018   Procedure: CHONDROPLASTY;  Surgeon: Bjorn Pippin, MD;  Location: [facility];  Service: Orthopedics;  Laterality: Left;   KNEE ARTHROSCOPY WITH MEDIAL MENISECTOMY Left 10/28/2018   Procedure: LEFT KNEE ARTHROSCOPY WITH MEDIAL MENISECTOMY;  Surgeon: Bjorn Pippin, MD;  Location: [facility];  Service: Orthopedics;  Laterality: Left;   RECONSTRUCTION OF NOSE  2019   TOTAL KNEE ARTHROPLASTY Left 07/20/2023   Procedure: ARTHROPLASTY, KNEE, TOTAL;  Surgeon: Joen Laura, MD;  Location: [facility];  Service: Orthopedics;  Laterality: Left;   WISDOM TOOTH EXTRACTION  2012    Allergies:  Allergies  Allergen Reactions   Celebrex [Celecoxib] Palpitations    Heavy breathing   Flexeril [Cyclobenzaprine] Hives    Family History:  Family History  Problem Relation Age of Onset   Dementia Mother    Heart Problems Mother        Double bypass   Diabetes Mother    Other Father        Prostate Issues    Social History:  Social History   Tobacco Use   Smoking status: Never    Passive exposure: Never   Smokeless tobacco: Never  Vaping Use   Vaping status: Never Used  Substance Use Topics   Alcohol use: Yes    Comment: 2-3x per week   Drug use: Never    ROS: Constitutional:  Negative for fever, chills, weight loss CV:  Negative for chest pain, previous MI, hypertension Respiratory:  Negative for shortness of breath, wheezing, sleep apnea, frequent cough GI:  Negative for nausea, vomiting, bloody stool, GERD  Physical exam: There were no vitals taken for this visit. GENERAL APPEARANCE:  Well appearing, well developed, well nourished, NAD HEENT:  Atraumatic, normocephalic, oropharynx clear NECK:  Supple without lymphadenopathy or thyromegaly ABDOMEN:  Soft, non-tender, no masses EXTREMITIES:  Moves all extremities well, without clubbing, cyanosis, or edema NEUROLOGIC:  Alert and oriented x 3, normal gait, CN II-XII grossly intact MENTAL STATUS:  appropriate BACK:  Non-tender to palpation, No CVAT SKIN:  Warm, dry, and intact  Results: None  Procedure:  VOIDING TRIAL  A voiding trial was performed in the office today.   Volume of sterile water instilled: 225 mL Foley catheter removed intact. Volume voided by patient: 175 mL  Instructed to return to office if has not voided by 4 PM

## 2023-08-10 NOTE — ED Notes (Signed)
 Verbal order form EDP to DC w foley catheter in place. Changed drainage system to leg bag. Pt provided w extra leg bag and education on changing bag and maintaining foley. Pt and pt spouse expressed understanding.

## 2023-08-11 ENCOUNTER — Ambulatory Visit (INDEPENDENT_AMBULATORY_CARE_PROVIDER_SITE_OTHER)

## 2023-08-11 DIAGNOSIS — N400 Enlarged prostate without lower urinary tract symptoms: Secondary | ICD-10-CM | POA: Diagnosis not present

## 2023-08-11 DIAGNOSIS — M533 Sacrococcygeal disorders, not elsewhere classified: Secondary | ICD-10-CM | POA: Diagnosis not present

## 2023-08-11 MED ORDER — GADOBUTROL 1 MMOL/ML IV SOLN
10.0000 mL | Freq: Once | INTRAVENOUS | Status: AC | PRN
  Administered 2023-08-11: 10 mL via INTRAVENOUS

## 2023-08-13 ENCOUNTER — Ambulatory Visit

## 2023-08-13 ENCOUNTER — Ambulatory Visit
Admission: EM | Admit: 2023-08-13 | Discharge: 2023-08-13 | Disposition: A | Discharge status: Home / Self Care | Attending: Family Medicine | Admitting: Family Medicine

## 2023-08-13 ENCOUNTER — Other Ambulatory Visit: Payer: Self-pay

## 2023-08-13 DIAGNOSIS — Z96652 Presence of left artificial knee joint: Secondary | ICD-10-CM | POA: Diagnosis not present

## 2023-08-13 DIAGNOSIS — M6281 Muscle weakness (generalized): Secondary | ICD-10-CM | POA: Diagnosis not present

## 2023-08-13 DIAGNOSIS — R233 Spontaneous ecchymoses: Secondary | ICD-10-CM | POA: Diagnosis not present

## 2023-08-13 DIAGNOSIS — R2689 Other abnormalities of gait and mobility: Secondary | ICD-10-CM | POA: Diagnosis not present

## 2023-08-13 DIAGNOSIS — M25562 Pain in left knee: Secondary | ICD-10-CM | POA: Diagnosis not present

## 2023-08-13 NOTE — ED Provider Notes (Signed)
 [facility]    CSN: [id number] Arrival date & time: 08/13/23  1143      History   Chief Complaint Chief Complaint  Patient presents with   Rash    HPI Brandon Riley is a 63 y.o. male.   HPI  Pleasant 63 year old gentleman who is here with his wife for evaluation of a rash on his left foot.  It has just been noted.  He was seen by physical therapy earlier today.  He had a knee replacement on his left knee performed on 07/20/2023.  He has had a lot of bruising and swelling in this leg.  The bruising and swelling is resolving.  Now they have seen his rash.  Patient and wife are here concerned that there may be a complication. He has also had urinary retention.  Has had a Foley catheter placed and and removed a couple of times.  He has been on new medications.  They will make sure it is not a drug reaction. They also have concerned that he may have a nerve damage from the attempt at placing a spinal needle for epidural anesthesia at the time of the surgery.  Past Medical History:  Diagnosis Date   Arthritis    GERD (gastroesophageal reflux disease)    Hyperlipemia    Hypertension    Restless leg syndrome    Sleep apnea    w cpap    Patient Active Problem List   Diagnosis Date Noted   Sacral lesion 08/04/2023   Urinary retention 08/03/2023   Localized osteoarthritis of left knee 07/20/2023   Preoperative clearance 05/12/2023   COVID-19 03/30/2023   Restless leg syndrome 10/08/2022   Dermatitis 09/01/2022   Elevated PSA 09/07/2020   Polyarthralgia 08/31/2020   Steatohepatitis 05/16/2020   Open fracture of left thumb 01/31/2020   GERD (gastroesophageal reflux disease) 11/07/2016   Primary osteoarthritis of right wrist 09/29/2016   Mood disorder (HCC) 12/17/2015   Left insertional Achilles tendinosis 11/05/2015   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   Obstructive sleep apnea on BiPAP with periodic limb movement disorder 06/05/2014   Obesity 05/02/2013    Annual physical exam 04/04/2013   Primary osteoarthritis of both knees with pseudogout 04/04/2013   Degenerative disc disease, cervical 04/04/2013   Hyperlipidemia 04/04/2013   Benign essential hypertension 04/04/2013   Lumbar degenerative disc disease 04/04/2013    Past Surgical History:  Procedure Laterality Date   CHONDROPLASTY Left 10/28/2018   Procedure: CHONDROPLASTY;  Surgeon: Bjorn Pippin, MD;  Location: [facility];  Service: Orthopedics;  Laterality: Left;   KNEE ARTHROSCOPY WITH MEDIAL MENISECTOMY Left 10/28/2018   Procedure: LEFT KNEE ARTHROSCOPY WITH MEDIAL MENISECTOMY;  Surgeon: Bjorn Pippin, MD;  Location: [facility];  Service: Orthopedics;  Laterality: Left;   RECONSTRUCTION OF NOSE  2019   TOTAL KNEE ARTHROPLASTY Left 07/20/2023   Procedure: ARTHROPLASTY, KNEE, TOTAL;  Surgeon: Joen Laura, MD;  Location: [facility];  Service: Orthopedics;  Laterality: Left;   WISDOM TOOTH EXTRACTION  2012       Home Medications    Prior to Admission medications   Medication Sig Start Date End Date Taking? Authorizing Provider  aspirin EC 81 MG tablet Take 1 tablet (81 mg total) by mouth 2 (two) times daily for 28 days. Swallow whole. 07/21/23 08/18/23  Cecil Cobbs, PA-C  Blood Pressure Monitoring (BLOOD PRESSURE CUFF) MISC Check blood pressure at home once or twice a day after sitting relaxed for  15 minutes 05/12/23   Monica Becton, MD  MILK THISTLE EXTRACT PO Take 1 capsule by mouth daily with lunch.    [provider]  Misc Natural Products (PROSTATE SUPPORT PO) Take 1 capsule by mouth daily with lunch.    [provider]  rOPINIRole (REQUIP) 1 MG tablet Take 1-2 tablets with ropinirole 4mg  tablets. Patient taking differently: Take 3 mg by mouth at bedtime. 11/17/22   Nita Sickle K, DO  rosuvastatin (CRESTOR) 10 MG tablet TAKE ONE TABLET BY MOUTH DAILY Patient taking differently: Take 10 mg by mouth every evening.  09/21/22   Monica Becton, MD  silodosin (RAPAFLO) 8 MG CAPS capsule Take 1 capsule (8 mg total) by mouth daily with breakfast. 08/10/23   Stoneking, Danford Bad., MD    Family History Family History  Problem Relation Age of Onset   Dementia Mother    Heart Problems Mother        Double bypass   Diabetes Mother    Other Father        Prostate Issues    Social History Social History   Tobacco Use   Smoking status: Never    Passive exposure: Never   Smokeless tobacco: Never  Vaping Use   Vaping status: Never Used  Substance Use Topics   Alcohol use: Yes    Comment: 2-3x per week   Drug use: Never     Allergies   Celebrex [celecoxib] and Flexeril [cyclobenzaprine]   Review of Systems Review of Systems See HPI  Physical Exam Triage Vital Signs ED Triage Vitals  Encounter Vitals Group     BP 08/13/23 1148 (!) 144/83     Systolic BP Percentile --      Diastolic BP Percentile --      Pulse Rate 08/13/23 1148 100     Resp 08/13/23 1148 16     Temp 08/13/23 1148 98 F (36.7 C)     Temp Source 08/13/23 1148 Oral     SpO2 08/13/23 1148 96 %     Weight --      Height --      Head Circumference --      Peak Flow --      Pain Score 08/13/23 1152 0     Pain Loc --      Pain Education --      Exclude from Growth Chart --    No data found.  Updated Vital Signs BP (!) 144/83   Pulse 100   Temp 98 F (36.7 C) (Oral)   Resp 16   SpO2 96%       Physical Exam Constitutional:      General: He is not in acute distress.    Appearance: He is well-developed. He is not ill-appearing.     Comments: Stocky and muscular.  Mildly overweight  HENT:     Head: Normocephalic and atraumatic.  Eyes:     Conjunctiva/sclera: Conjunctivae normal.     Pupils: Pupils are equal, round, and reactive to light.  Cardiovascular:     Rate and Rhythm: Normal rate.  Pulmonary:     Effort: Pulmonary effort is normal. No respiratory distress.  Musculoskeletal:        General:  Normal range of motion.     Cervical back: Normal range of motion.  Skin:    General: Skin is warm and dry.     Findings: Rash present.     Comments: There are scattered petechiae across the  top of the left foot with fewer up towards the knee.  Resolving ecchymosis noted.  Mild soft tissue swelling noted.  Incision is healing well  Neurological:     Mental Status: He is alert.      UC Treatments / Results  Labs (all labs ordered are listed, but only abnormal results are displayed) Labs Reviewed - No data to display  EKG   Radiology No results found.  Procedures Procedures (including critical care time)  Medications Ordered in UC Medications - No data to display  Initial Impression / Assessment and Plan / UC Course  I have reviewed the triage vital signs and the nursing notes.  Pertinent labs & imaging results that were available during my care of the patient were reviewed by me and considered in my medical decision making (see chart for details).     Patient's primary care doctor is Dr. Sandy Crumb.  He saw the patient a couple days ago.  I walked over and spoke with Dr. Sandy Crumb who came and examined the patient with me.  We both feel like this petechial rash is from a tourniquet placed at the time of surgery and just now noticed.  Patient and wife are reassured that it is harmless. Final Clinical Impressions(s) / UC Diagnoses   Final diagnoses:  Petechiae  Status post left knee replacement     Discharge Instructions      Return if needed   ED Prescriptions   None    PDMP not reviewed this encounter.   Stephany Ehrich, MD 08/13/23 [phone]

## 2023-08-13 NOTE — Therapy (Signed)
 OUTPATIENT PHYSICAL THERAPY LOWER EXTREMITY TREATMENT   Patient Name: Brandon Riley MRN: [id number] DOB:1960/08/28, 63 y.o., male Today's Date: 08/13/2023  END OF SESSION:  PT End of Session - 08/13/23 1101     Visit Number 6    Number of Visits 16    Date for PT Re-Evaluation 09/20/23    Authorization Type aetna    PT Start Time 1101    PT Stop Time 1141    PT Time Calculation (min) 40 min    Activity Tolerance Patient tolerated treatment well    Behavior During Therapy WFL for tasks assessed/performed               Past Medical History:  Diagnosis Date   Arthritis    GERD (gastroesophageal reflux disease)    Hyperlipemia    Hypertension    Restless leg syndrome    Sleep apnea    w cpap   Past Surgical History:  Procedure Laterality Date   CHONDROPLASTY Left 10/28/2018   Procedure: CHONDROPLASTY;  Surgeon: Micheline Ahr, MD;  Location: [facility];  Service: Orthopedics;  Laterality: Left;   KNEE ARTHROSCOPY WITH MEDIAL MENISECTOMY Left 10/28/2018   Procedure: LEFT KNEE ARTHROSCOPY WITH MEDIAL MENISECTOMY;  Surgeon: Micheline Ahr, MD;  Location: [facility];  Service: Orthopedics;  Laterality: Left;   RECONSTRUCTION OF NOSE  2019   TOTAL KNEE ARTHROPLASTY Left 07/20/2023   Procedure: ARTHROPLASTY, KNEE, TOTAL;  Surgeon: Murleen Arms, MD;  Location: [facility];  Service: Orthopedics;  Laterality: Left;   WISDOM TOOTH EXTRACTION  2012   Patient Active Problem List   Diagnosis Date Noted   Sacral lesion 08/04/2023   Urinary retention 08/03/2023   Localized osteoarthritis of left knee 07/20/2023   Preoperative clearance 05/12/2023   COVID-19 03/30/2023   Restless leg syndrome 10/08/2022   Dermatitis 09/01/2022   Elevated PSA 09/07/2020   Polyarthralgia 08/31/2020   Steatohepatitis 05/16/2020   Open fracture of left thumb 01/31/2020   GERD (gastroesophageal reflux disease) 11/07/2016   Primary osteoarthritis of right wrist 09/29/2016    Mood disorder (HCC) 12/17/2015   Left insertional Achilles tendinosis 11/05/2015   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   Obstructive sleep apnea on BiPAP with periodic limb movement disorder 06/05/2014   Obesity 05/02/2013   Annual physical exam 04/04/2013   Primary osteoarthritis of both knees with pseudogout 04/04/2013   Degenerative disc disease, cervical 04/04/2013   Hyperlipidemia 04/04/2013   Benign essential hypertension 04/04/2013   Lumbar degenerative disc disease 04/04/2013    PCP: Annemarie Kil MD REFERRING PROVIDER: Priscille Brought, MD REFERRING DIAG:  Diagnosis  [id number] (ICD-10-CM) - S/P total knee replacement    THERAPY DIAG:  Acute pain of left knee  Other abnormalities of gait and mobility  Muscle weakness (generalized)  Rationale for Evaluation and Treatment: Rehabilitation  ONSET DATE: 07/20/23 surgical date  SUBJECTIVE:   SUBJECTIVE STATEMENT: Patient reports the knots are gone that he was working on last week, but feels that his ROM has decreased. The knee is sore. Catheter was removed again on Monday, but this failed so he still has the catheter. They are planning for an exploratory procedure next week to further assess urinary retention. He did noticed small red bumps on the top of the foot this morning that are not painful/itchy.   EVAL:The patient reports "both knees are shot" having meniscus repair in the past. He expects pain and is ready to start rehab. He had 1.5 years of  pain prior to surgery. His surgery was on 07/20/23 for L TKR and post surgical course was complicated by urinary retention-- he is using a foley cath at this time and has appt with urology next week. He is using ice for pain control and meds.   PERTINENT HISTORY: Covid, RLS, OSA on bipap, HTN, DDD PAIN:  Are you having pain? Yes: NPRS scale:  3/10 Pain location: Lt knee Pain description: sore Aggravating factors: movement, swelling Relieving factors: pain  meds, ice  PRECAUTIONS: None  WEIGHT BEARING RESTRICTIONS:  WBAT  FALLS:  Has patient fallen in last 6 months? Yes, 3--felt balance was off (works on this at [employer])  LIVING ENVIRONMENT: Lives with: lives with their spouse Lives in: House/apartment Stairs: Yes: Internal: 17, basement steps; one rail and 2 walls and External: 2 steps; none Has following equipment at home: Otho Blitz - 2 wheeled  OCCUPATION: retired, Product/process development scientist   PATIENT GOALS: Good quality of life-- be able to do 7/8 of what I used to do.  NEXT MD VISIT: 08/04/23  OBJECTIVE:  Note: Objective measures were completed at Evaluation unless otherwise noted. PATIENT SURVEYS:  LEFS 10% COGNITION: Overall cognitive status: Within functional limits for tasks assessed   EDEMA:  Circumferential: 37 on R and 44 on L PALPATION: Edema L knee with dressing intact LOWER EXTREMITY ROM: Active ROM Right eval Left eval Left 07/27/23 Left 08/03/23 08/13/23 Left   Hip flexion       Hip extension       Hip abduction       Hip adduction       Hip internal rotation       Hip external rotation       Knee flexion  AROM 75 deg heel slide AAROM 94 deg AAROM 102 deg supine  Sitting AAROM 103 deg 90 AROM  Knee extension  -9 PROM -12 AROM  -6 deg supine   Ankle dorsiflexion       Ankle plantarflexion       Ankle inversion       Ankle eversion        (Blank rows = not tested) LOWER EXTREMITY MMT: MMT Right eval Left eval  Hip flexion    Hip extension    Hip abduction    Hip adduction    Hip internal rotation    Hip external rotation    Knee flexion    Knee extension  LAQ to -38  Ankle dorsiflexion    Ankle plantarflexion    Ankle inversion    Ankle eversion     (Blank rows = not tested) FUNCTIONAL TESTS:  Gait speed = 1.23 ft/sec GAIT: Distance walked: 100 ft Assistive device utilized: Environmental consultant - 2 wheeled Level of assistance: Modified independence Comments: stairs with RW since no rails at home with step to  pattern and SBA for safety [facility] Adult PT Treatment:                                                DATE: 08/13/23 Therapeutic Exercise: Heel slides x 10  HS curl green band 2 x 10  Manual Therapy: Lt knee PROM flexion/extension to tolerance Lt patellar mobilizations grade II-III all planes Neuromuscular re-ed: LAQ 2 x 10  Therapeutic Activity: Sit to stand 2 x 10  Step ups 6 inch x 10    [facility]  Adult PT Treatment:                                                DATE: 08/06/2023 Therapeutic Exercise: Recumbent bicycle: semi-revolutions for L knee ROM Manual Therapy: Supine: Pin and stretch soft tissue mobilization to the peripatellar tissues and proximal/distal patellar tendon into knee flexion Pin and stretch soft tissue mobilization to the distal quadriceps Pin and stretch soft tissue mobilization to the L proximal lateral quadriceps Deep pressure soft tissue mobilization to the L proximal lateral quadriceps  Modalities: Supine, legs on bolster: Game Ready, low pressure, 34 deg, x 15 min  [facility] Adult PT Treatment:                                                DATE: 08/03/23 Therapeutic Exercise: Warm up bike: partial revolutions Seated Isometric quad into physioball Isometric HS into physioball  AAROM knee flexion Standing Knee flexion x 10 reps  Supine SLR with tactile cues for quad engagement x 10 reps (mild extensor lag noted) SLR with ER for VMO activation x 5 reps PROM flexion/extension with overpressure Quad set x 10 reps End range flexion  Knee AAROM with foot on ball Bridges x 10 reps Neuromuscular re-ed: Foam standing with lateral step downs  Foam standing with eyes open/eyes closed independently Modalities: Game ready: vasopneumatic L knee x 10 minutes, 34 degrees                              PATIENT EDUCATION:  Education details: recommended to f/u with physician today regarding rash/bumps on the LLE Person educated: Patient, spouse Education method:  Explanation Education comprehension: verbalized understanding  HOME EXERCISE PROGRAM: Access Code: [email or web address] URL: [email or web address] Date: 08/03/2023 Prepared by: Trygve Gage  Exercises - Seated Heel Slide  - 2 x daily - 7 x weekly - 2 sets - 5 reps - Seated Long Arc Quad with Strap  - 1 x daily - 7 x weekly - 2 sets - 5 reps - Supine Heel Slide  - 1 x daily - 7 x weekly - 2 sets - 10 reps - Supine Quad Set  - 1 x daily - 7 x weekly - 2 sets - 10 reps - Supine Hip Abduction  - 1 x daily - 7 x weekly - 2 sets - 10 reps - Supine Straight Leg Raises  - 1 x daily - 7 x weekly - 2 sets - 10 reps - Heel Raises with Counter Support  - 1 x daily - 7 x weekly - 2 sets - 12 reps - Mini Squats at Table  - 1 x daily - 7 x weekly - 2 sets - 12 reps  ASSESSMENT:  CLINICAL IMPRESSION: Patient arrives with mild knee soreness and reports noticing small red non-painful/non-itching bumps on the top of the Lt foot this morning. He has non-raised small red spots on the dorsum of foot/anterior lower leg and was recommended to reach out to PCP or urgent care regarding this new issue today. He tolerated strength and ROM progression well without an increase in knee pain and soreness. He has initial difficulty allowing for equal weight-bearing with sit to stands.  With continued reps and cues is able to correct. Weakness present with step ups as he has difficulty allowing for full knee extension. He has plans to go to urgent care following this visit for further assessment of above stated issue.    EVAL: Patient is a 63 y.o. male who was seen today for physical therapy evaluation and treatment for L TKR on 07/20/23. He presents today with impairments in ROM, strength, edema, pain, and functional limitations s/p surgery. Patient's hospital course complicated by urinary retention. He is f/u in OP with urology. PT to progress patient to tolerance with goal of returning to prior functional status with  reduced pain.    OBJECTIVE IMPAIRMENTS: Abnormal gait, decreased activity tolerance, decreased balance, decreased endurance, decreased ROM, decreased strength, hypomobility, increased edema, increased fascial restrictions, impaired flexibility, and pain.   GOALS: Goals reviewed with patient? Yes  SHORT TERM GOALS: Target date: 08/21/23  The patient will be indep with initial HEP Baseline: initiated at eval Goal status: INITIAL  2.  The patient will improve LEFS up to 30% to demonstrate improved functional abilities. Baseline:  10% Goal status: INITIAL  3.   The patient will report pain with walking < or equal to 3/10.  Baseline:  6-7/10 Goal status: INITIAL  4.  The patient will improve AROM L knee to 6 to 100 degrees. Baseline:  12 to 75 Goal status: INITIAL  LONG TERM GOALS: Target date: 09/20/23  The patient will be indep with progression of HEP. Baseline:  initiated at eval Goal status: INITIAL  2.  The patient will improve LEFS up to 50%% to demonstrate improved functional abilities. Baseline: 10% Goal status: INITIAL  3.  The patient will improve AROM L knee to 110 degrees flexion. Baseline:  75 degrees Goal status: INITIAL  4.  The patient will improve AROM L knee to -3 degrees full extension. Baseline:  -12 AROM Goal status: INITIAL  5.  The patient will negotiate steps mod indep with reciprocal pattern x 4 steps with one rail. Baseline:  step to pattern with walker and SBA. Goal status: INITIAL  6.  The patient will improve gait speed to > or equal to 2.5 ft/sec.  Baseline: 1.23 ft/sec Goal status: INITIAL  PLAN:  PT FREQUENCY: 2x/week  PT DURATION: 8 weeks  PLANNED INTERVENTIONS: 97164- PT Re-evaluation, 97110-Therapeutic exercises, 97530- Therapeutic activity, [id number]- Neuromuscular re-education, 97535- Self Care, [id number]- Manual therapy, [id number]- Gait training, [phone]- Electrical stimulation (unattended), 97016- Vasopneumatic device, 97033- Ionotophoresis  4mg /ml Dexamethasone, Patient/Family education, Balance training, Stair training, Taping, Dry Needling, Joint mobilization, DME instructions, and Cryotherapy  PLAN FOR NEXT SESSION:  progress therex to tolerance. Gait training working on normalizing gait mechanics. Urgent care f/u for rash/bumps??  Judah Chevere, PT, DPT, ATC 08/13/23 11:45 AM

## 2023-08-13 NOTE — Discharge Instructions (Signed)
 Return if needed

## 2023-08-13 NOTE — ED Triage Notes (Addendum)
 C/o "rash"  (but looks like petechiae) to left foot and leg. Had left knee replacement a month ago. Is on aspirin but no blood thinner. Does not hurt.

## 2023-08-17 ENCOUNTER — Ambulatory Visit: Admitting: Rehabilitative and Restorative Service Providers"

## 2023-08-17 ENCOUNTER — Encounter: Payer: Self-pay | Admitting: Rehabilitative and Restorative Service Providers"

## 2023-08-17 DIAGNOSIS — M6281 Muscle weakness (generalized): Secondary | ICD-10-CM | POA: Diagnosis not present

## 2023-08-17 DIAGNOSIS — M25562 Pain in left knee: Secondary | ICD-10-CM | POA: Diagnosis not present

## 2023-08-17 DIAGNOSIS — R2689 Other abnormalities of gait and mobility: Secondary | ICD-10-CM

## 2023-08-17 NOTE — Therapy (Signed)
 OUTPATIENT PHYSICAL THERAPY LOWER EXTREMITY TREATMENT   Patient Name: Brandon Riley MRN: [id number] DOB:1961-01-29, 63 y.o., male Today's Date: 08/17/2023  END OF SESSION:  PT End of Session - 08/17/23 1055     Visit Number 7    Number of Visits 16    Date for PT Re-Evaluation 09/20/23    Authorization Type aetna    PT Start Time 1056    PT Stop Time 1145    PT Time Calculation (min) 49 min    Activity Tolerance Patient tolerated treatment well    Behavior During Therapy WFL for tasks assessed/performed             Past Medical History:  Diagnosis Date   Arthritis    GERD (gastroesophageal reflux disease)    Hyperlipemia    Hypertension    Restless leg syndrome    Sleep apnea    w cpap   Past Surgical History:  Procedure Laterality Date   CHONDROPLASTY Left 10/28/2018   Procedure: CHONDROPLASTY;  Surgeon: Micheline Ahr, MD;  Location: [facility];  Service: Orthopedics;  Laterality: Left;   KNEE ARTHROSCOPY WITH MEDIAL MENISECTOMY Left 10/28/2018   Procedure: LEFT KNEE ARTHROSCOPY WITH MEDIAL MENISECTOMY;  Surgeon: Micheline Ahr, MD;  Location: [facility];  Service: Orthopedics;  Laterality: Left;   RECONSTRUCTION OF NOSE  2019   TOTAL KNEE ARTHROPLASTY Left 07/20/2023   Procedure: ARTHROPLASTY, KNEE, TOTAL;  Surgeon: Murleen Arms, MD;  Location: [facility];  Service: Orthopedics;  Laterality: Left;   WISDOM TOOTH EXTRACTION  2012   Patient Active Problem List   Diagnosis Date Noted   Sacral lesion 08/04/2023   Urinary retention 08/03/2023   Localized osteoarthritis of left knee 07/20/2023   Preoperative clearance 05/12/2023   COVID-19 03/30/2023   Restless leg syndrome 10/08/2022   Dermatitis 09/01/2022   Elevated PSA 09/07/2020   Polyarthralgia 08/31/2020   Steatohepatitis 05/16/2020   Open fracture of left thumb 01/31/2020   GERD (gastroesophageal reflux disease) 11/07/2016   Primary osteoarthritis of right wrist 09/29/2016    Mood disorder (HCC) 12/17/2015   Left insertional Achilles tendinosis 11/05/2015   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   Obstructive sleep apnea on BiPAP with periodic limb movement disorder 06/05/2014   Obesity 05/02/2013   Annual physical exam 04/04/2013   Primary osteoarthritis of both knees with pseudogout 04/04/2013   Degenerative disc disease, cervical 04/04/2013   Hyperlipidemia 04/04/2013   Benign essential hypertension 04/04/2013   Lumbar degenerative disc disease 04/04/2013    PCP: Annemarie Kil MD REFERRING PROVIDER: Priscille Brought, MD REFERRING DIAG:  Diagnosis  [id number] (ICD-10-CM) - S/P total knee replacement    THERAPY DIAG:  Acute pain of left knee  Other abnormalities of gait and mobility  Muscle weakness (generalized)  Rationale for Evaluation and Treatment: Rehabilitation  ONSET DATE: 07/20/23 surgical date  SUBJECTIVE:   SUBJECTIVE STATEMENT: The patient reports his knee is doing well. His stiffness in his knee is improving. Rash on his foot is improving.He goes up/down stairs to the basement.  EVAL:The patient reports "both knees are shot" having meniscus repair in the past. He expects pain and is ready to start rehab. He had 1.5 years of pain prior to surgery. His surgery was on 07/20/23 for L TKR and post surgical course was complicated by urinary retention-- he is using a foley cath at this time and has appt with urology next week. He is using ice for pain control and meds.  PERTINENT HISTORY: Covid, RLS, OSA on bipap, HTN, DDD PAIN:  Are you having pain? Yes: NPRS scale:  3/10 Pain location: Lt knee Pain description: sore Aggravating factors: movement, swelling Relieving factors: pain meds, ice  PRECAUTIONS: None  WEIGHT BEARING RESTRICTIONS:  WBAT  FALLS:  Has patient fallen in last 6 months? Yes, 3--felt balance was off (works on this at [employer])  LIVING ENVIRONMENT: Lives with: lives with their spouse Lives in:  House/apartment Stairs: Yes: Internal: 17, basement steps; one rail and 2 walls and External: 2 steps; none Has following equipment at home: Otho Blitz - 2 wheeled  OCCUPATION: retired, Product/process development scientist   PATIENT GOALS: Good quality of life-- be able to do 7/8 of what I used to do.  NEXT MD VISIT: 08/04/23  OBJECTIVE:  Note: Objective measures were completed at Evaluation unless otherwise noted. PATIENT SURVEYS:  LEFS 10% COGNITION: Overall cognitive status: Within functional limits for tasks assessed   EDEMA:  Circumferential: 37 on R and 44 on L PALPATION: Edema L knee with dressing intact LOWER EXTREMITY ROM: Active ROM Right eval Left eval Left 07/27/23 Left 08/03/23 08/13/23 Left  08/17/23 Left  Hip flexion        Hip extension        Hip abduction        Hip adduction        Hip internal rotation        Hip external rotation        Knee flexion  AROM 75 deg heel slide AAROM 94 deg AAROM 102 deg supine  Sitting AAROM 103 deg 90 AROM 104 AROM heel slide  Knee extension  -9 PROM -12 AROM  -6 deg supine    Ankle dorsiflexion        Ankle plantarflexion        Ankle inversion        Ankle eversion         (Blank rows = not tested) LOWER EXTREMITY MMT: MMT Right eval Left eval Left 08/17/23  Hip flexion     Hip extension     Hip abduction     Hip adduction     Hip internal rotation     Hip external rotation     Knee flexion     Knee extension  LAQ to -38 Extensor Lag -20  Ankle dorsiflexion     Ankle plantarflexion     Ankle inversion     Ankle eversion      (Blank rows = not tested) FUNCTIONAL TESTS:  Gait speed = 1.23 ft/sec GAIT: Distance walked: 100 ft Assistive device utilized: Environmental consultant - 2 wheeled Level of assistance: Modified independence Comments: stairs with RW since no rails at home with step to pattern and SBA for safety  [facility] Adult PT Treatment:                                                DATE: 08/17/23 Therapeutic Exercise: Warmup bike x 4  minutes rocking ant/posterior to tolerable end ranges Supine AAROM knee flexion x 10 reps sliding L LE along wall Knee to chest with passive overpressure Thomas test stretch with overpressure for quad Prone Passive overpressure into extension Standing Step ups x 6" x 10 reps with intermittent UE support Heel raises bilateral to unilateral with UE support Sitting Terminal knee extension LAQ Manual Therapy:  Patellar mobilizations lateral and superior grade II-III Hamstring STM prone with knee in extension with overpressure using IASTM Therapeutic Activity: Foot in stride position with anterior/posterior weight shifting Backwards walking Marching Gait: Gait with heel strike and tactile cues for hip rotation in transverse plane x 180 ft  [facility] Adult PT Treatment:                                                DATE: 08/13/23 Therapeutic Exercise: Heel slides x 10  HS curl green band 2 x 10  Manual Therapy: Lt knee PROM flexion/extension to tolerance Lt patellar mobilizations grade II-III all planes Neuromuscular re-ed: LAQ 2 x 10  Therapeutic Activity: Sit to stand 2 x 10  Step ups 6 inch x 10    [facility] Adult PT Treatment:                                                DATE: 08/06/2023 Therapeutic Exercise: Recumbent bicycle: semi-revolutions for L knee ROM Manual Therapy: Supine: Pin and stretch soft tissue mobilization to the peripatellar tissues and proximal/distal patellar tendon into knee flexion Pin and stretch soft tissue mobilization to the distal quadriceps Pin and stretch soft tissue mobilization to the L proximal lateral quadriceps Deep pressure soft tissue mobilization to the L proximal lateral quadriceps  Modalities: Supine, legs on bolster: Game Ready, low pressure, 34 deg, x 15 min  [facility] Adult PT Treatment:                                                DATE: 08/03/23 Therapeutic Exercise: Warm up bike: partial revolutions Seated Isometric quad into  physioball Isometric HS into physioball  AAROM knee flexion Standing Knee flexion x 10 reps  Supine SLR with tactile cues for quad engagement x 10 reps (mild extensor lag noted) SLR with ER for VMO activation x 5 reps PROM flexion/extension with overpressure Quad set x 10 reps End range flexion  Knee AAROM with foot on ball Bridges x 10 reps Neuromuscular re-ed: Foam standing with lateral step downs  Foam standing with eyes open/eyes closed independently Modalities: Game ready: vasopneumatic L knee x 10 minutes, 34 degrees                              PATIENT EDUCATION:  Education details: recommended to f/u with physician today regarding rash/bumps on the LLE Person educated: Patient, spouse Education method: Explanation Education comprehension: verbalized understanding  HOME EXERCISE PROGRAM: Access Code: [email or web address] URL: [email or web address] Date: 08/03/2023 Prepared by: Trygve Gage  Exercises - Seated Heel Slide  - 2 x daily - 7 x weekly - 2 sets - 5 reps - Seated Long Arc Quad with Strap  - 1 x daily - 7 x weekly - 2 sets - 5 reps - Supine Heel Slide  - 1 x daily - 7 x weekly - 2 sets - 10 reps - Supine Quad Set  - 1 x daily - 7 x weekly - 2 sets - 10 reps - Supine Hip Abduction  -  1 x daily - 7 x weekly - 2 sets - 10 reps - Supine Straight Leg Raises  - 1 x daily - 7 x weekly - 2 sets - 10 reps - Heel Raises with Counter Support  - 1 x daily - 7 x weekly - 2 sets - 12 reps - Mini Squats at Table  - 1 x daily - 7 x weekly - 2 sets - 12 reps  ASSESSMENT:  CLINICAL IMPRESSION: Patient has less stiffness this week-- he was sore from manual/ STM last week. PT continuing to progress work to normalize gait mechanics. PT working on quad control and end range extension.   EVAL: Patient is a 63 y.o. male who was seen today for physical therapy evaluation and treatment for L TKR on 07/20/23. He presents today with impairments in ROM, strength, edema, pain,  and functional limitations s/p surgery. Patient's hospital course complicated by urinary retention. He is f/u in OP with urology. PT to progress patient to tolerance with goal of returning to prior functional status with reduced pain.    OBJECTIVE IMPAIRMENTS: Abnormal gait, decreased activity tolerance, decreased balance, decreased endurance, decreased ROM, decreased strength, hypomobility, increased edema, increased fascial restrictions, impaired flexibility, and pain.   GOALS: Goals reviewed with patient? Yes  SHORT TERM GOALS: Target date: 08/21/23  The patient will be indep with initial HEP Baseline: initiated at eval Goal status: INITIAL  2.  The patient will improve LEFS up to 30% to demonstrate improved functional abilities. Baseline:  10% Goal status: INITIAL  3.   The patient will report pain with walking < or equal to 3/10.  Baseline:  6-7/10 Goal status: INITIAL  4.  The patient will improve AROM L knee to 6 to 100 degrees. Baseline:  12 to 75 Goal status: INITIAL  LONG TERM GOALS: Target date: 09/20/23  The patient will be indep with progression of HEP. Baseline:  initiated at eval Goal status: INITIAL  2.  The patient will improve LEFS up to 50%% to demonstrate improved functional abilities. Baseline: 10% Goal status: INITIAL  3.  The patient will improve AROM L knee to 110 degrees flexion. Baseline:  75 degrees Goal status: INITIAL  4.  The patient will improve AROM L knee to -3 degrees full extension. Baseline:  -12 AROM Goal status: INITIAL  5.  The patient will negotiate steps mod indep with reciprocal pattern x 4 steps with one rail. Baseline:  step to pattern with walker and SBA. Goal status: INITIAL  6.  The patient will improve gait speed to > or equal to 2.5 ft/sec.  Baseline: 1.23 ft/sec Goal status: INITIAL  PLAN:  PT FREQUENCY: 2x/week  PT DURATION: 8 weeks  PLANNED INTERVENTIONS: 97164- PT Re-evaluation, 97110-Therapeutic exercises,  97530- Therapeutic activity, [id number]- Neuromuscular re-education, 97535- Self Care, 29562- Manual therapy, [id number]- Gait training, [id number]- Electrical stimulation (unattended), 97016- Vasopneumatic device, 97033- Ionotophoresis 4mg /ml Dexamethasone , Patient/Family education, Balance training, Stair training, Taping, Dry Needling, Joint mobilization, DME instructions, and Cryotherapy  PLAN FOR NEXT SESSION:  progress therex to tolerance. Gait training working on normalizing gait mechanics.   Tharon Bomar, PT 08/17/23 1:02 PM

## 2023-08-20 ENCOUNTER — Other Ambulatory Visit: Payer: Self-pay

## 2023-08-20 ENCOUNTER — Ambulatory Visit

## 2023-08-20 ENCOUNTER — Encounter (HOSPITAL_BASED_OUTPATIENT_CLINIC_OR_DEPARTMENT_OTHER): Payer: Self-pay | Admitting: Emergency Medicine

## 2023-08-20 ENCOUNTER — Emergency Department (HOSPITAL_BASED_OUTPATIENT_CLINIC_OR_DEPARTMENT_OTHER)
Admission: EM | Admit: 2023-08-20 | Discharge: 2023-08-20 | Disposition: A | Discharge status: Home / Self Care | Attending: Emergency Medicine | Admitting: Emergency Medicine

## 2023-08-20 ENCOUNTER — Encounter: Payer: Self-pay | Admitting: Urology

## 2023-08-20 ENCOUNTER — Ambulatory Visit: Admitting: Urology

## 2023-08-20 VITALS — BP 149/93 | HR 108 | Ht 68.0 in | Wt 231.0 lb

## 2023-08-20 DIAGNOSIS — N138 Other obstructive and reflux uropathy: Secondary | ICD-10-CM

## 2023-08-20 DIAGNOSIS — R61 Generalized hyperhidrosis: Secondary | ICD-10-CM | POA: Insufficient documentation

## 2023-08-20 DIAGNOSIS — R972 Elevated prostate specific antigen [PSA]: Secondary | ICD-10-CM

## 2023-08-20 DIAGNOSIS — R338 Other retention of urine: Secondary | ICD-10-CM

## 2023-08-20 DIAGNOSIS — R2689 Other abnormalities of gait and mobility: Secondary | ICD-10-CM

## 2023-08-20 DIAGNOSIS — N401 Enlarged prostate with lower urinary tract symptoms: Secondary | ICD-10-CM

## 2023-08-20 DIAGNOSIS — Z87898 Personal history of other specified conditions: Secondary | ICD-10-CM | POA: Diagnosis not present

## 2023-08-20 DIAGNOSIS — M25562 Pain in left knee: Secondary | ICD-10-CM

## 2023-08-20 DIAGNOSIS — M6281 Muscle weakness (generalized): Secondary | ICD-10-CM

## 2023-08-20 DIAGNOSIS — R1030 Lower abdominal pain, unspecified: Secondary | ICD-10-CM | POA: Insufficient documentation

## 2023-08-20 DIAGNOSIS — R339 Retention of urine, unspecified: Secondary | ICD-10-CM | POA: Diagnosis not present

## 2023-08-20 MED ORDER — LORAZEPAM 1 MG PO TABS
1.0000 mg | ORAL_TABLET | Freq: Once | ORAL | Status: AC
  Administered 2023-08-20: 1 mg via ORAL
  Filled 2023-08-20: qty 1

## 2023-08-20 MED ORDER — LIDOCAINE HCL URETHRAL/MUCOSAL 2 % EX GEL
1.0000 | Freq: Once | CUTANEOUS | Status: AC
  Administered 2023-08-20: 1 via URETHRAL
  Filled 2023-08-20: qty 11

## 2023-08-20 MED ORDER — CIPROFLOXACIN HCL 500 MG PO TABS
500.0000 mg | ORAL_TABLET | Freq: Once | ORAL | Status: AC
  Administered 2023-08-20: 500 mg via ORAL

## 2023-08-20 NOTE — ED Notes (Addendum)
 Pt had FC removed this am was doing great until this afternoon Began having spasms and is only "dribbling" pain is 10/10 when he tries to void.  Bladder scan 

## 2023-08-20 NOTE — Progress Notes (Signed)
 Assessment: 1. Urinary retention   2. BPH with obstruction/lower urinary tract symptoms   3. Elevated PSA; negative biopsy 10/22     Plan: I discussed the findings on cystoscopy with the patient and his wife today.  He does have evidence of prostate enlargement which is likely causing his urinary retention.  I again discussed options for management including intermittent catheterization, medical therapy, and surgical therapy. Given his large prostate volume, he would likely benefit from HoLEP or simple prostatectomy. Foley catheter removed after successful voiding trial today. Cipro  x 1 following cystoscopy and Foley removal. Continue silodosin  8 mg daily He knows to return to the office later today if he is unable to void. Return to the office in 1 week with bladder scan.  Chief Complaint:  Chief Complaint  Patient presents with   Urinary Retention    History of Present Illness:  Brandon Riley is a 63 y.o. male who is seen for continued evaluation of urinary retention. He reported no problems with urination prior to his surgery.  He previously took tamsulosin  for a short period of time due to increased urinary symptoms.  However, he had discontinued this prior to his recent episode. He underwent a knee arthroplasty on 07/20/2023 and developed postoperative urinary retention.  There was an attempt at spinal anesthesia. He had a Foley catheter placed and was discharged home with a catheter and on tamsulosin .  He presented to [facility] ER on 3/27 with problems with the Foley catheter drainage.  He was found to have some small clots in the catheter.  His Foley was removed at [facility] on 07/30/2023.  He was unable to void following removal and return to the emergency room later that evening.  A Foley catheter was replaced on 07/31/2023 with return of 2000 mL of urine.  He continued on tamsulosin  0.8 mg daily.  He is having regular bowel movements.  No lower extremity weakness or  numbness. 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  He has a history of an elevated PSA. PSA results: 10/21 1.2 5/22 6.0  23% free 6/22 6.5  28% free 3/23 5.0  32% free 6/23 4.9  41% free 6/24 4.8  31% free 1/25 5.5  28.5% free  He underwent a prostate biopsy in October 2022 by Dr. Elidia Grout with [facility].  Pathology showed benign prostate tissue with atrophy and associated mixed inflammation. Prostate volume:  125.9 g  His Foley catheter was removed on 08/10/2023 after a successful voiding trial in the office.  Unfortunately, he had difficulty voiding later that day and return to the emergency room where a Foley catheter was placed.  He was changed to silodosin  at that time.  His catheter has been draining well. He presents today for cystoscopy and a voiding trial. His catheter has been draining well.  He has had some intermittent hematuria.  Portions of the above documentation were copied from a prior visit for review purposes only.   Past Medical History:  Past Medical History:  Diagnosis Date   Arthritis    GERD (gastroesophageal reflux disease)    Hyperlipemia    Hypertension    Restless leg syndrome    Sleep apnea    w cpap    Past Surgical History:  Past Surgical History:  Procedure Laterality Date   CHONDROPLASTY Left 10/28/2018  Procedure: CHONDROPLASTY;  Surgeon: Micheline Ahr, MD;  Location: [facility];  Service: Orthopedics;  Laterality: Left;   KNEE ARTHROSCOPY WITH MEDIAL MENISECTOMY Left 10/28/2018   Procedure: LEFT KNEE ARTHROSCOPY WITH MEDIAL MENISECTOMY;  Surgeon: Micheline Ahr, MD;  Location: [facility];  Service: Orthopedics;  Laterality: Left;   RECONSTRUCTION OF NOSE  2019   TOTAL KNEE ARTHROPLASTY Left 07/20/2023   Procedure: ARTHROPLASTY, KNEE,  TOTAL;  Surgeon: Murleen Arms, MD;  Location: [facility];  Service: Orthopedics;  Laterality: Left;   WISDOM TOOTH EXTRACTION  2012    Allergies:  Allergies  Allergen Reactions   Celebrex [Celecoxib] Palpitations    Heavy breathing   Flexeril [Cyclobenzaprine] Hives    Family History:  Family History  Problem Relation Age of Onset   Dementia Mother    Heart Problems Mother        Double bypass   Diabetes Mother    Other Father        Prostate Issues    Social History:  Social History   Tobacco Use   Smoking status: Never    Passive exposure: Never   Smokeless tobacco: Never  Vaping Use   Vaping status: Never Used  Substance Use Topics   Alcohol  use: Yes    Comment: 2-3x per week   Drug use: Never    ROS: Constitutional:  Negative for fever, chills, weight loss CV: Negative for chest pain, previous MI, hypertension Respiratory:  Negative for shortness of breath, wheezing, sleep apnea, frequent cough GI:  Negative for nausea, vomiting, bloody stool, GERD  Physical exam: BP (!) 149/93   Pulse (!) 108   Ht 5\' 8"  (1.727 m)   Wt 231 lb (104.8 kg)   BMI 35.12 kg/m  GENERAL APPEARANCE:  Well appearing, well developed, well nourished, NAD HEENT:  Atraumatic, normocephalic, oropharynx clear NECK:  Supple without lymphadenopathy or thyromegaly ABDOMEN:  Soft, non-tender, no masses EXTREMITIES:  Moves all extremities well, without clubbing, cyanosis, or edema NEUROLOGIC:  Alert and oriented x 3, normal gait, CN II-XII grossly intact MENTAL STATUS:  appropriate BACK:  Non-tender to palpation, No CVAT SKIN:  Warm, dry, and intact  Results: None  Procedure:  Flexible Cystourethroscopy  Pre-operative Diagnosis:  Urinary retention  Post-operative Diagnosis:  Urinary retention  Anesthesia:  local with lidocaine  jelly  Surgical Narrative:  After appropriate informed consent was obtained, the patient was prepped and draped in the usual sterile fashion in the  supine position.  The patient was correctly identified and the proper procedure delineated prior to proceeding.  Sterile lidocaine  gel was instilled in the urethra. The flexible cystoscope was introduced without difficulty.  Findings:  Anterior urethra: Normal  Posterior urethra:  lateral lobe enlargement; visualization is limited due to patient discomfort  Bladder:  Limited exam due to patient discomfort; no obvious bladder lesions; no trabeculations or cellules  Ureteral orifices:  not visualized  Additional findings:  Saline bladder wash for cytology was not performed.   Approximately 250 mL of sterile saline was placed into the bladder during cystoscopy.  The cystoscope was then removed.  The patient tolerated the procedure well. He was able to void 180 mL following removal of the cystoscope.

## 2023-08-20 NOTE — ED Triage Notes (Signed)
 Pt POV in wheelchair- pt reports difficulty after urinary catheter removed this AM. Catheter removed appx 0900 today, pt voided after. Last voided appx 1600 today.   Pt reports some dysuria today.

## 2023-08-20 NOTE — Discharge Instructions (Addendum)
 It was a pleasure taking part in your care.  As discussed, please follow-up with Dr. Willye Harvey in the morning.  Please have Foley remain in place until seen by urology.  Please return to the ED with any new or worsening symptoms.

## 2023-08-20 NOTE — ED Provider Notes (Signed)
 [facility] Provider Note   CSN: [id number] Arrival date & time: 08/20/23  4098     History  Chief Complaint  Patient presents with   Urinary Retention    Brandon Riley is a 63 y.o. male with medical history of prostate enlargement, indwelling Foley catheter.  Patient presents to ED for evaluation of urinary retention.  The patient was seen by urology this morning.  Patient had Foley catheter removed and patient passed trial of void.  He was discharged home and advised to follow-up in 1 week.  On chart review, the patient urologist believes the patient might need prostatectomy.  Patient reports that he has not urinated fully since 4 PM.  He reports extreme abdominal pain described as burning.  He also endorses dysuria.  Patient had cystoscopy today which showed evidence of prostate enlargement likely causing urinary retention.  Patient after having cystoscopy performed received 1 dose of Cipro  and was discharged home.  Patient was advised to continue with silodosin .  He denies fevers at home, nausea or vomiting.  Patient in extreme amount of pain on evaluation.  HPI     Home Medications Prior to Admission medications   Medication Sig Start Date End Date Taking? Authorizing Provider  Blood Pressure Monitoring (BLOOD PRESSURE CUFF) MISC Check blood pressure at home once or twice a day after sitting relaxed for 15 minutes 05/12/23   Gean Keels, MD  MILK THISTLE EXTRACT PO Take 1 capsule by mouth daily with lunch.    [provider]  Misc Natural Products (PROSTATE SUPPORT PO) Take 1 capsule by mouth daily with lunch.    [provider]  rOPINIRole  (REQUIP ) 1 MG tablet Take 1-2 tablets with ropinirole  4mg  tablets. Patient taking differently: Take 3 mg by mouth at bedtime. 11/17/22   Patel, Donika K, DO  rosuvastatin  (CRESTOR ) 10 MG tablet TAKE ONE TABLET BY MOUTH DAILY Patient taking differently: Take 10 mg by mouth  every evening. 09/21/22   Gean Keels, MD  silodosin  (RAPAFLO ) 8 MG CAPS capsule Take 1 capsule (8 mg total) by mouth daily with breakfast. 08/10/23   Stoneking, Ponce Brisker., MD      Allergies    Celebrex [celecoxib] and Flexeril [cyclobenzaprine]    Review of Systems   Review of Systems  Genitourinary:  Positive for difficulty urinating and dysuria.  All other systems reviewed and are negative.   Physical Exam Updated Vital Signs BP (!) 179/136   Pulse 99   Temp 97.6 F (36.4 C) (Oral)   Resp (!) 28   Ht 5\' 8"  (1.727 m)   Wt 105.2 kg   SpO2 100%   BMI 35.28 kg/m  Physical Exam Vitals and nursing note reviewed.  Constitutional:      General: He is in acute distress.     Appearance: He is well-developed. He is diaphoretic.  HENT:     Head: Normocephalic and atraumatic.  Eyes:     Conjunctiva/sclera: Conjunctivae normal.  Cardiovascular:     Rate and Rhythm: Normal rate and regular rhythm.     Heart sounds: No murmur heard. Pulmonary:     Effort: Pulmonary effort is normal. No respiratory distress.     Breath sounds: Normal breath sounds.  Abdominal:     Palpations: Abdomen is soft.     Tenderness: There is abdominal tenderness.     Comments: Suprapubic TTP  Musculoskeletal:        General: No swelling.  Cervical back: Neck supple.  Skin:    General: Skin is warm.     Capillary Refill: Capillary refill takes less than 2 seconds.  Neurological:     Mental Status: He is alert.  Psychiatric:        Mood and Affect: Mood normal.     ED Results / Procedures / Treatments   Labs (all labs ordered are listed, but only abnormal results are displayed) Labs Reviewed - No data to display  EKG None  Radiology No results found.  Procedures Procedures   Medications Ordered in ED Medications  LORazepam  (ATIVAN ) tablet 1 mg (1 mg Oral Given 08/20/23 1915)  lidocaine  (XYLOCAINE ) 2 % jelly 1 Application (1 Application Urethral Given 08/20/23 1935)     ED Course/ Medical Decision Making/ A&P   Medical Decision Making Risk Prescription drug management.   63 year old male presents for evaluation.  Please see HPI for further details.  On examination the patient is afebrile, nontachycardic.  His lung sounds are clear bilaterally, he is not hypoxic.  Abdomen has tenderness in the suprapubic region.  No overlying skin change.  Neurological examinations at baseline.  Patient in extreme amount of pain.  Based on chart review, patient most likely is in need of catheter.  Will provide him with Ativan  and reinsert Foley catheter.  After catheter was inserted, the patient pain and discomfort is significantly decreased.  He reports that his pain is "nearly gone".  He was endorsing dysuria earlier however he did have recent instrumentation today and I suspect that this is most likely cause of dysuria.  He denies any history of UTIs associated with Foley catheters.  At this time, patient we discharged home.  Will have him follow-up with Dr. Willye Harvey, urology, in the morning.  He was advised to return to the ED with any new or worsening symptoms and he voiced understanding.  He is stable to discharge home.   Final Clinical Impression(s) / ED Diagnoses Final diagnoses:  Urinary retention    Rx / DC Orders ED Discharge Orders     None         Adel Aden, PA-C 08/20/23 2019    Lowery Rue, DO 08/20/23 2234

## 2023-08-20 NOTE — Progress Notes (Signed)
 Catheter Removal  Patient is present today for a catheter removal.  9ml of water  was drained from the balloon. A 16FR foley cath was removed from the bladder, no complications were noted. Patient tolerated well.  Performed by: Talma Aguillard CMA

## 2023-08-20 NOTE — Therapy (Signed)
 OUTPATIENT PHYSICAL THERAPY LOWER EXTREMITY TREATMENT   Patient Name: Brandon Riley MRN: [id number] DOB:[birth year], 63 y.o., male Today's Date: 08/20/2023  END OF SESSION:  PT End of Session - 08/20/23 1102     Visit Number 8    Number of Visits 16    Date for PT Re-Evaluation 09/20/23    Authorization Type aetna    PT Start Time 1102    PT Stop Time 1143    PT Time Calculation (min) 41 min    Activity Tolerance Patient tolerated treatment well    Behavior During Therapy WFL for tasks assessed/performed              Past Medical History:  Diagnosis Date   Arthritis    GERD (gastroesophageal reflux disease)    Hyperlipemia    Hypertension    Restless leg syndrome    Sleep apnea    w cpap   Past Surgical History:  Procedure Laterality Date   CHONDROPLASTY Left 10/28/2018   Procedure: CHONDROPLASTY;  Surgeon: Micheline Ahr, MD;  Location: [facility];  Service: Orthopedics;  Laterality: Left;   KNEE ARTHROSCOPY WITH MEDIAL MENISECTOMY Left 10/28/2018   Procedure: LEFT KNEE ARTHROSCOPY WITH MEDIAL MENISECTOMY;  Surgeon: Micheline Ahr, MD;  Location: [facility];  Service: Orthopedics;  Laterality: Left;   RECONSTRUCTION OF NOSE  2019   TOTAL KNEE ARTHROPLASTY Left 07/20/2023   Procedure: ARTHROPLASTY, KNEE, TOTAL;  Surgeon: Murleen Arms, MD;  Location: [facility];  Service: Orthopedics;  Laterality: Left;   WISDOM TOOTH EXTRACTION  2012   Patient Active Problem List   Diagnosis Date Noted   Sacral lesion 08/04/2023   Urinary retention 08/03/2023   Localized osteoarthritis of left knee 07/20/2023   Preoperative clearance 05/12/2023   COVID-19 03/30/2023   Restless leg syndrome 10/08/2022   Dermatitis 09/01/2022   Elevated PSA 09/07/2020   Polyarthralgia 08/31/2020   Steatohepatitis 05/16/2020   Open fracture of left thumb 01/31/2020   GERD (gastroesophageal reflux disease) 11/07/2016   Primary osteoarthritis of right wrist 09/29/2016    Mood disorder (HCC) 12/17/2015   Left insertional Achilles tendinosis 11/05/2015   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   Obstructive sleep apnea on BiPAP with periodic limb movement disorder 06/05/2014   Obesity 05/02/2013   Annual physical exam 04/04/2013   Primary osteoarthritis of both knees with pseudogout 04/04/2013   Degenerative disc disease, cervical 04/04/2013   Hyperlipidemia 04/04/2013   Benign essential hypertension 04/04/2013   Lumbar degenerative disc disease 04/04/2013    PCP: Annemarie Kil MD REFERRING PROVIDER: Priscille Brought, MD REFERRING DIAG:  Diagnosis  [phone] (ICD-10-CM) - S/P total knee replacement    THERAPY DIAG:  Acute pain of left knee  Other abnormalities of gait and mobility  Muscle weakness (generalized)  Rationale for Evaluation and Treatment: Rehabilitation  ONSET DATE: 07/20/23 surgical date  SUBJECTIVE:   SUBJECTIVE STATEMENT: Patient had appointment with urology this morning. Had catheter removed. If urinary retention occurs and then they will consider laser treatment for enlarged prostate. Patient reports the knee has been fine.   EVAL:The patient reports "both knees are shot" having meniscus repair in the past. He expects pain and is ready to start rehab. He had 1.5 years of pain prior to surgery. His surgery was on 07/20/23 for L TKR and post surgical course was complicated by urinary retention-- he is using a foley cath at this time and has appt with urology next week. He is using ice  for pain control and meds.   PERTINENT HISTORY: Covid, RLS, OSA on bipap, HTN, DDD PAIN:  Are you having pain? Yes: NPRS scale:  1/10 Pain location: Lt knee Pain description: sore Aggravating factors: movement, swelling Relieving factors: pain meds, ice  PRECAUTIONS: None  WEIGHT BEARING RESTRICTIONS:  WBAT  FALLS:  Has patient fallen in last 6 months? Yes, 3--felt balance was off (works on this at [employer])  LIVING  ENVIRONMENT: Lives with: lives with their spouse Lives in: House/apartment Stairs: Yes: Internal: 17, basement steps; one rail and 2 walls and External: 2 steps; none Has following equipment at home: Otho Blitz - 2 wheeled  OCCUPATION: retired, Product/process development scientist   PATIENT GOALS: Good quality of life-- be able to do 7/8 of what I used to do.  NEXT MD VISIT: 08/04/23  OBJECTIVE:  Note: Objective measures were completed at Evaluation unless otherwise noted. PATIENT SURVEYS:  LEFS 10% COGNITION: Overall cognitive status: Within functional limits for tasks assessed   EDEMA:  Circumferential: 37 on R and 44 on L PALPATION: Edema L knee with dressing intact LOWER EXTREMITY ROM: Active ROM Right eval Left eval Left 07/27/23 Left 08/03/23 08/13/23 Left  08/17/23 Left 08/20/23 Left   Hip flexion         Hip extension         Hip abduction         Hip adduction         Hip internal rotation         Hip external rotation         Knee flexion  AROM 75 deg heel slide AAROM 94 deg AAROM 102 deg supine  Sitting AAROM 103 deg 90 AROM 104 AROM heel slide 105  Knee extension  -9 PROM -12 AROM  -6 deg supine   Lacking 4 in supine  Ankle dorsiflexion         Ankle plantarflexion         Ankle inversion         Ankle eversion          (Blank rows = not tested) LOWER EXTREMITY MMT: MMT Right eval Left eval Left 08/17/23  Hip flexion     Hip extension     Hip abduction     Hip adduction     Hip internal rotation     Hip external rotation     Knee flexion     Knee extension  LAQ to -38 Extensor Lag -20  Ankle dorsiflexion     Ankle plantarflexion     Ankle inversion     Ankle eversion      (Blank rows = not tested) FUNCTIONAL TESTS:  Gait speed = 1.23 ft/sec GAIT: Distance walked: 100 ft Assistive device utilized: Environmental consultant - 2 wheeled Level of assistance: Modified independence Comments: stairs with RW since no rails at home with step to pattern and SBA for safety [facility] Adult PT  Treatment:                                                DATE: 08/20/23 Therapeutic Exercise: Prone knee hang x 2 minutes 2 lbs Updated HEP  Manual Therapy: Lt patellar mobilizations all planes grade II-III Lt knee PROM flexion/extension to tolerance Neuromuscular re-ed: TKE blue band 2 x 10  Therapeutic Activity: Leg press 1 x 10 @ 145  lbs, 1 x 10 @ 170 lbs, 1 x 10 @ 190 lbs  Backward walking 2 x 15 ft    [facility] Adult PT Treatment:                                                DATE: 08/17/23 Therapeutic Exercise: Warmup bike x 4 minutes rocking ant/posterior to tolerable end ranges Supine AAROM knee flexion x 10 reps sliding L LE along wall Knee to chest with passive overpressure Thomas test stretch with overpressure for quad Prone Passive overpressure into extension Standing Step ups x 6" x 10 reps with intermittent UE support Heel raises bilateral to unilateral with UE support Sitting Terminal knee extension LAQ Manual Therapy: Patellar mobilizations lateral and superior grade II-III Hamstring STM prone with knee in extension with overpressure using IASTM Therapeutic Activity: Foot in stride position with anterior/posterior weight shifting Backwards walking Marching Gait: Gait with heel strike and tactile cues for hip rotation in transverse plane x 180 ft  [facility] Adult PT Treatment:                                                DATE: 08/13/23 Therapeutic Exercise: Heel slides x 10  HS curl green band 2 x 10  Manual Therapy: Lt knee PROM flexion/extension to tolerance Lt patellar mobilizations grade II-III all planes Neuromuscular re-ed: LAQ 2 x 10  Therapeutic Activity: Sit to stand 2 x 10  Step ups 6 inch x 10                              PATIENT EDUCATION:  Education details: HEP update  Person educated: Patient,  Education method: Explanation, demo, handout Education comprehension: verbalized understanding, returned demo   HOME EXERCISE PROGRAM: Access  Code: [email or web address] URL: [email or web address] Date: 08/20/2023 Prepared by: Forrestine Ike  Exercises - Seated Heel Slide  - 2 x daily - 7 x weekly - 2 sets - 5 reps - Seated Long Arc Quad with Strap  - 1 x daily - 7 x weekly - 2 sets - 5 reps - Supine Heel Slide  - 1 x daily - 7 x weekly - 2 sets - 10 reps - Supine Quad Set  - 1 x daily - 7 x weekly - 2 sets - 10 reps - Supine Hip Abduction  - 1 x daily - 7 x weekly - 2 sets - 10 reps - Supine Straight Leg Raises  - 1 x daily - 7 x weekly - 2 sets - 10 reps - Heel Raises with Counter Support  - 1 x daily - 7 x weekly - 2 sets - 12 reps - Mini Squats at Table  - 1 x daily - 7 x weekly - 2 sets - 12 reps - Standing Terminal Knee Extension with Resistance  - 1 x daily - 7 x weekly - 2 sets - 10 reps - Prone Knee Extension with Ankle Weight  - 1 x daily - 7 x weekly - 1 sets - max hold  ASSESSMENT:  CLINICAL IMPRESSION: Continues to make gradual progress in regards to Lt knee AROM, but remains limited. Focused on addressing knee extension deficits with  patient able to tolerate brief duration of prone knee hang. He demonstrates good quad activation with TKE. With backward walking he demonstrates moderate sway, signifying need to address balance deficits at future sessions. No reports of increased knee pain throughout session.   EVAL: Patient is a 63 y.o. male who was seen today for physical therapy evaluation and treatment for L TKR on 07/20/23. He presents today with impairments in ROM, strength, edema, pain, and functional limitations s/p surgery. Patient's hospital course complicated by urinary retention. He is f/u in OP with urology. PT to progress patient to tolerance with goal of returning to prior functional status with reduced pain.    OBJECTIVE IMPAIRMENTS: Abnormal gait, decreased activity tolerance, decreased balance, decreased endurance, decreased ROM, decreased strength, hypomobility, increased edema, increased fascial  restrictions, impaired flexibility, and pain.   GOALS: Goals reviewed with patient? Yes  SHORT TERM GOALS: Target date: 08/21/23  The patient will be indep with initial HEP Baseline: initiated at eval Goal status: MET  2.  The patient will improve LEFS up to 30% to demonstrate improved functional abilities. Baseline:  10% Goal status: INITIAL  3.   The patient will report pain with walking < or equal to 3/10.  Baseline:  6-7/10 Goal status: INITIAL  4.  The patient will improve AROM L knee to 6 to 100 degrees. Baseline:  12 to 75 Goal status: MET  LONG TERM GOALS: Target date: 09/20/23  The patient will be indep with progression of HEP. Baseline:  initiated at eval Goal status: INITIAL  2.  The patient will improve LEFS up to 50%% to demonstrate improved functional abilities. Baseline: 10% Goal status: INITIAL  3.  The patient will improve AROM L knee to 110 degrees flexion. Baseline:  75 degrees Goal status: INITIAL  4.  The patient will improve AROM L knee to -3 degrees full extension. Baseline:  -12 AROM Goal status: INITIAL  5.  The patient will negotiate steps mod indep with reciprocal pattern x 4 steps with one rail. Baseline:  step to pattern with walker and SBA. Goal status: INITIAL  6.  The patient will improve gait speed to > or equal to 2.5 ft/sec.  Baseline: 1.23 ft/sec Goal status: INITIAL  PLAN:  PT FREQUENCY: 2x/week  PT DURATION: 8 weeks  PLANNED INTERVENTIONS: 97164- PT Re-evaluation, 97110-Therapeutic exercises, 97530- Therapeutic activity, [id number]- Neuromuscular re-education, 97535- Self Care, 32440- Manual therapy, [id number]- Gait training, [id number]- Electrical stimulation (unattended), 97016- Vasopneumatic device, 97033- Ionotophoresis 4mg /ml Dexamethasone , Patient/Family education, Balance training, Stair training, Taping, Dry Needling, Joint mobilization, DME instructions, and Cryotherapy  PLAN FOR NEXT SESSION:  progress therex to tolerance. Gait  training working on normalizing gait mechanics. Check STG. Balance activity.   Amey Hossain, PT, DPT, ATC 08/20/23 11:46 AM

## 2023-08-21 ENCOUNTER — Encounter: Payer: Self-pay | Admitting: Sports Medicine

## 2023-08-21 ENCOUNTER — Ambulatory Visit: Admitting: Urology

## 2023-08-21 ENCOUNTER — Telehealth: Payer: Self-pay

## 2023-08-21 ENCOUNTER — Other Ambulatory Visit: Payer: Self-pay | Admitting: Urology

## 2023-08-21 ENCOUNTER — Telehealth: Payer: Self-pay | Admitting: Urology

## 2023-08-21 DIAGNOSIS — N401 Enlarged prostate with lower urinary tract symptoms: Secondary | ICD-10-CM

## 2023-08-21 NOTE — Telephone Encounter (Signed)
 Pt called to make us  aware that he ended up at the ER last night due to Urinary retention pt stated he was not able to void after 5 pm. They ended up doing another cath pt would like to know the next steps from here. Please advise. Notes are in my chart from ER visit.

## 2023-08-21 NOTE — Telephone Encounter (Signed)
 Per Dr. Marcheta Seta should be calling pt within the next week or so with more information about surgery. Pt made aware and voiced understanding.

## 2023-08-24 ENCOUNTER — Ambulatory Visit: Admitting: Rehabilitative and Restorative Service Providers"

## 2023-08-24 ENCOUNTER — Encounter: Payer: Self-pay | Admitting: Rehabilitative and Restorative Service Providers"

## 2023-08-24 ENCOUNTER — Other Ambulatory Visit: Payer: Self-pay

## 2023-08-24 ENCOUNTER — Telehealth: Payer: Self-pay

## 2023-08-24 DIAGNOSIS — M25562 Pain in left knee: Secondary | ICD-10-CM

## 2023-08-24 DIAGNOSIS — R2689 Other abnormalities of gait and mobility: Secondary | ICD-10-CM

## 2023-08-24 DIAGNOSIS — M6281 Muscle weakness (generalized): Secondary | ICD-10-CM

## 2023-08-24 DIAGNOSIS — N138 Other obstructive and reflux uropathy: Secondary | ICD-10-CM

## 2023-08-24 NOTE — Progress Notes (Signed)
   [facility] Posting Form  Surgery Date: Date: 09/25/2023  Surgeon: Dr. Jay Meth, MD  Inpt ( No  )   Outpt (Yes)   Obs ( No  )   Diagnosis: N40.1, N13.8 Benign Prostatic Hyperplasia with Urinary Obstruction  -CPT: [phone]  Surgery: Holmium Laser Enucleation of the Prostate  Stop Anticoagulations: Yes and also hold ASA  Cardiac/Medical/Pulmonary Clearance needed: no  *Orders entered into EPIC  Date: 08/24/23   *Case booked in [place]  Date: 08/24/23  *Notified pt of Surgery: Date: 08/24/23  PRE-OP UA & CX: yes, will obtain while in clinic on 08/27/2023  *Placed into Prior Authorization Work [place] Date: 08/24/23  Assistant/laser/rep:No

## 2023-08-24 NOTE — Progress Notes (Signed)
 Surgical Physician Order [facility]   Dr. Jay Meth, MD  * Scheduling expectation : 5/30?  Patient will be scheduled in clinic in the next 2 weeks for initial visit  *Length of Case: 2 hours  *Clearance needed: no  *Anticoagulation Instructions: Hold all anticoagulants  *Aspirin  Instructions: Hold Aspirin   *Post-op visit Date/Instructions:  1-3 day cath removal  *Diagnosis: BPH w/urinary obstruction  *Procedure:  HOLEP (78295)   Additional orders: N/A  -Admit type: OUTpatient  -Anesthesia: General  -VTE Prophylaxis Standing Order SCD's       Other:   -Standing Lab Orders Per Anesthesia    Lab other: UA&Urine Culture  -Standing Test orders EKG/Chest x-ray per Anesthesia       Test other:   - Medications:  Ancef  2gm IV  -Other orders:  N/A

## 2023-08-24 NOTE — Therapy (Signed)
 OUTPATIENT PHYSICAL THERAPY LOWER EXTREMITY TREATMENT   Patient Name: DAMEER LAMON MRN: [id number] DOB:09/23/1960, 63 y.o., male Today's Date: 08/24/2023  END OF SESSION:  PT End of Session - 08/24/23 1020     Visit Number 9    Number of Visits 16    Date for PT Re-Evaluation 09/20/23    Authorization Type aetna    PT Start Time 1015    PT Stop Time 1110    PT Time Calculation (min) 55 min    Activity Tolerance Patient tolerated treatment well    Behavior During Therapy WFL for tasks assessed/performed             Past Medical History:  Diagnosis Date   Arthritis    GERD (gastroesophageal reflux disease)    Hyperlipemia    Hypertension    Restless leg syndrome    Sleep apnea    w cpap   Past Surgical History:  Procedure Laterality Date   CHONDROPLASTY Left 10/28/2018   Procedure: CHONDROPLASTY;  Surgeon: Micheline Ahr, MD;  Location: [facility];  Service: Orthopedics;  Laterality: Left;   KNEE ARTHROSCOPY WITH MEDIAL MENISECTOMY Left 10/28/2018   Procedure: LEFT KNEE ARTHROSCOPY WITH MEDIAL MENISECTOMY;  Surgeon: Micheline Ahr, MD;  Location: [facility];  Service: Orthopedics;  Laterality: Left;   RECONSTRUCTION OF NOSE  2019   TOTAL KNEE ARTHROPLASTY Left 07/20/2023   Procedure: ARTHROPLASTY, KNEE, TOTAL;  Surgeon: Murleen Arms, MD;  Location: [facility];  Service: Orthopedics;  Laterality: Left;   WISDOM TOOTH EXTRACTION  2012   Patient Active Problem List   Diagnosis Date Noted   Sacral lesion 08/04/2023   Urinary retention 08/03/2023   Localized osteoarthritis of left knee 07/20/2023   Preoperative clearance 05/12/2023   COVID-19 03/30/2023   Restless leg syndrome 10/08/2022   Dermatitis 09/01/2022   Elevated PSA 09/07/2020   Polyarthralgia 08/31/2020   Steatohepatitis 05/16/2020   Open fracture of left thumb 01/31/2020   GERD (gastroesophageal reflux disease) 11/07/2016   Primary osteoarthritis of right wrist 09/29/2016    Mood disorder (HCC) 12/17/2015   Left insertional Achilles tendinosis 11/05/2015   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   Obstructive sleep apnea on BiPAP with periodic limb movement disorder 06/05/2014   Obesity 05/02/2013   Annual physical exam 04/04/2013   Primary osteoarthritis of both knees with pseudogout 04/04/2013   Degenerative disc disease, cervical 04/04/2013   Hyperlipidemia 04/04/2013   Benign essential hypertension 04/04/2013   Lumbar degenerative disc disease 04/04/2013    PCP: Annemarie Kil MD REFERRING PROVIDER: Priscille Brought, MD REFERRING DIAG:  Diagnosis  [phone] (ICD-10-CM) - S/P total knee replacement   THERAPY DIAG:  Acute pain of left knee  Other abnormalities of gait and mobility  Muscle weakness (generalized)  Rationale for Evaluation and Treatment: Rehabilitation  ONSET DATE: 07/20/23 surgical date  SUBJECTIVE:  SUBJECTIVE STATEMENT: Patient has started back at the gym and did squats and heel raises this morning before PT visit. He had to go back to the ED on Thursday night and now knows he will have to have surgery this week for enlarged prostate. He returned to yard work this weekend.  EVAL:The patient reports "both knees are shot" having meniscus repair in the past. He expects pain and is ready to start rehab. He had 1.5 years of pain prior to surgery. His surgery was on 07/20/23 for L TKR and post surgical course was complicated by urinary retention-- he is using a foley  cath at this time and has appt with urology next week. He is using ice for pain control and meds.   PERTINENT HISTORY: Covid, RLS, OSA on bipap, HTN, DDD PAIN:  Are you having pain? Yes: NPRS scale:  0/10 Pain location: Lt knee Pain description: sore Aggravating factors: movement, swelling Relieving factors: pain meds, ice  PRECAUTIONS: None  WEIGHT BEARING RESTRICTIONS:  WBAT  FALLS:  Has patient fallen in last 6 months? Yes, 3--felt balance was off  (works on this at [employer])  LIVING ENVIRONMENT: Lives with: lives with their spouse Lives in: House/apartment Stairs: Yes: Internal: 17, basement steps; one rail and 2 walls and External: 2 steps; none Has following equipment at home: Otho Blitz - 2 wheeled  OCCUPATION: retired, Product/process development scientist   PATIENT GOALS: Good quality of life-- be able to do 7/8 of what I used to do.  NEXT MD VISIT: 08/04/23  OBJECTIVE:  Note: Objective measures were completed at Evaluation unless otherwise noted. PATIENT SURVEYS:  LEFS 10% COGNITION: Overall cognitive status: Within functional limits for tasks assessed   EDEMA:  Circumferential: 37 on R and 44 on L PALPATION: Edema L knee with dressing intact LOWER EXTREMITY ROM: Active ROM Right eval Left eval Left 07/27/23 Left 08/03/23 08/13/23 Left  08/17/23 Left 08/20/23 Left  08/24/23 Left  Hip flexion          Hip extension          Hip abduction          Hip adduction          Hip internal rotation          Hip external rotation          Knee flexion  AROM 75 deg heel slide AAROM 94 deg AAROM 102 deg supine  Sitting AAROM 103 deg 90 AROM 104 AROM heel slide 105 110  Knee extension  -9 PROM -12 AROM  -6 deg supine   Lacking 4 in supine   Ankle dorsiflexion          Ankle plantarflexion          Ankle inversion          Ankle eversion           (Blank rows = not tested) LOWER EXTREMITY MMT: MMT Right eval Left eval Left 08/17/23  Hip flexion     Hip extension     Hip abduction     Hip adduction     Hip internal rotation     Hip external rotation     Knee flexion     Knee extension  LAQ to -38 Extensor Lag -20  Ankle dorsiflexion     Ankle plantarflexion     Ankle inversion     Ankle eversion      (Blank rows = not tested) FUNCTIONAL TESTS:  Gait speed = 1.23 ft/sec GAIT: Distance walked: 100 ft Assistive device utilized: Environmental consultant - 2 wheeled Level of assistance: Modified independence Comments: stairs with RW since no rails at  home with step to pattern and SBA for safety  [facility] Adult PT Treatment:                                                DATE: 08/24/23 Therapeutic Exercise: Warm up with bike: x 3 minutes *patient got phone call to schedule urology  consult for surgery during visit-- PT held time and added it to end to ensure he got a full session.  *Made 2 full revolutions Prone PROM knee flexion with overpressure (notes discomfort along anterior knee) Knee hand with passive overpressure Glut extension x 10 reps Standing Lateral 4" step up L LE x 8 reps with significant fatigue Anterior steps ups L LE 4" mving R LE to 8" step with intermittent UE support Supine SLR x 10 reps Knee to chest with flexion overpressure Heel slide with 110 deg PROM flexion  Manual Therapy: STM medial HS, patellar mobilizations grade II-III all planes, scar tissue massage (on proximal 1/2 due to distal 1/2 not ready) Muscle roller Hs in prone Gait: With cues for longer stride length in hallway-- provided anterior L hip cues and shoulder cues for arm swing to encourage rotation of hips in transverse plane Marching anteriorly x 30 feet x 3 reps Backwards walking x 10 feet x 5 reps Stair negotiation with reciprocal ascending-- still painful to descend reciprocal   [facility] Adult PT Treatment:                                                DATE: 08/20/23 Therapeutic Exercise: Prone knee hang x 2 minutes 2 lbs Updated HEP  Manual Therapy: Lt patellar mobilizations all planes grade II-III Lt knee PROM flexion/extension to tolerance Neuromuscular re-ed: TKE blue band 2 x 10  Therapeutic Activity: Leg press 1 x 10 @ 145 lbs, 1 x 10 @ 170 lbs, 1 x 10 @ 190 lbs  Backward walking 2 x 15 ft    [facility] Adult PT Treatment:                                                DATE: 08/17/23 Therapeutic Exercise: Warmup bike x 4 minutes rocking ant/posterior to tolerable end ranges Supine AAROM knee flexion x 10 reps sliding L LE along  wall Knee to chest with passive overpressure Thomas test stretch with overpressure for quad Prone Passive overpressure into extension Standing Step ups x 6" x 10 reps with intermittent UE support Heel raises bilateral to unilateral with UE support Sitting Terminal knee extension LAQ Manual Therapy: Patellar mobilizations lateral and superior grade II-III Hamstring STM prone with knee in extension with overpressure using IASTM Therapeutic Activity: Foot in stride position with anterior/posterior weight shifting Backwards walking Marching Gait: Gait with heel strike and tactile cues for hip rotation in transverse plane x 180 ft  [facility] Adult PT Treatment:                                                DATE: 08/13/23 Therapeutic Exercise: Heel slides x 10  HS curl green band 2 x 10  Manual Therapy: Lt knee PROM flexion/extension to tolerance Lt patellar mobilizations grade II-III all planes Neuromuscular re-ed: LAQ 2 x 10  Therapeutic Activity: Sit to stand 2 x 10  Step ups 6 inch x 10  PATIENT EDUCATION:  Education details: HEP update  Person educated: Patient,  Education method: Explanation, demo, handout Education comprehension: verbalized understanding, returned demo   HOME EXERCISE PROGRAM: Access Code: [email or web address] URL: [email or web address] Date: 08/20/2023 Prepared by: Forrestine Ike  Exercises - Seated Heel Slide  - 2 x daily - 7 x weekly - 2 sets - 5 reps - Seated Long Arc Quad with Strap  - 1 x daily - 7 x weekly - 2 sets - 5 reps - Supine Heel Slide  - 1 x daily - 7 x weekly - 2 sets - 10 reps - Supine Quad Set  - 1 x daily - 7 x weekly - 2 sets - 10 reps - Supine Hip Abduction  - 1 x daily - 7 x weekly - 2 sets - 10 reps - Supine Straight Leg Raises  - 1 x daily - 7 x weekly - 2 sets - 10 reps - Heel Raises with Counter Support  - 1 x daily - 7 x weekly - 2 sets - 12 reps - Mini Squats at Table  - 1 x daily - 7 x weekly  - 2 sets - 12 reps - Standing Terminal Knee Extension with Resistance  - 1 x daily - 7 x weekly - 2 sets - 10 reps - Prone Knee Extension with Ankle Weight  - 1 x daily - 7 x weekly - 1 sets - max hold  ASSESSMENT:  CLINICAL IMPRESSION: The patient is continuing to make gains in range of motion for flexion. He has some extensor lag with SLR, especially when eccentrically lowering. We discussed gait mechanics further discussing finding a level space to practice x 3-5 minutes/day of walking for exercise focusing on longer stride. He reverts to short stride and lateral leaning in trunk when not focused on gait mechanics. PT also added scar massage to top 1/2 of incision -- bottom half still has evidence of exudate, so waiting until fully healed. The patient returned to yard work and gym this week.   EVAL: Patient is a 63 y.o. male who was seen today for physical therapy evaluation and treatment for L TKR on 07/20/23. He presents today with impairments in ROM, strength, edema, pain, and functional limitations s/p surgery. Patient's hospital course complicated by urinary retention. He is f/u in OP with urology. PT to progress patient to tolerance with goal of returning to prior functional status with reduced pain.    OBJECTIVE IMPAIRMENTS: Abnormal gait, decreased activity tolerance, decreased balance, decreased endurance, decreased ROM, decreased strength, hypomobility, increased edema, increased fascial restrictions, impaired flexibility, and pain.   GOALS: Goals reviewed with patient? Yes  SHORT TERM GOALS: Target date: 08/21/23  The patient will be indep with initial HEP Baseline: initiated at eval Goal status: MET  2.  The patient will improve LEFS up to 30% to demonstrate improved functional abilities. Baseline:  10% Goal status: INITIAL  3.   The patient will report pain with walking < or equal to 3/10.  Baseline:  6-7/10 Goal status:MET- 4/28- patient notes "sore" after gait  4.  The  patient will improve AROM L knee to 6 to 100 degrees. Baseline:  12 to 75 Goal status: MET  LONG TERM GOALS: Target date: 09/20/23  The patient will be indep with progression of HEP. Baseline:  initiated at eval Goal status: INITIAL  2.  The patient will improve LEFS up to 50%% to demonstrate improved functional abilities. Baseline: 10% Goal status: INITIAL  3.  The patient  will improve AROM L knee to 110 degrees flexion. Baseline:  75 degrees Goal status: INITIAL  4.  The patient will improve AROM L knee to -3 degrees full extension. Baseline:  -12 AROM Goal status: INITIAL  5.  The patient will negotiate steps mod indep with reciprocal pattern x 4 steps with one rail. Baseline:  step to pattern with walker and SBA. Goal status: INITIAL  6.  The patient will improve gait speed to > or equal to 2.5 ft/sec.  Baseline: 1.23 ft/sec Goal status: INITIAL  PLAN:  PT FREQUENCY: 2x/week  PT DURATION: 8 weeks  PLANNED INTERVENTIONS: 97164- PT Re-evaluation, 97110-Therapeutic exercises, 97530- Therapeutic activity, [id number]- Neuromuscular re-education, 97535- Self Care, [id number]- Manual therapy, [id number]- Gait training, [phone]- Electrical stimulation (unattended), 97016- Vasopneumatic device, [phone]- Ionotophoresis 4mg /ml Dexamethasone , Patient/Family education, Balance training, Stair training, Taping, Dry Needling, Joint mobilization, DME instructions, and Cryotherapy  PLAN FOR NEXT SESSION:  progress therex to tolerance. Gait training working on normalizing gait mechanics. Check STG. Balance activity.  *Check STG for LEFS next visit  Anthonio Mizzell, PT 08/24/23 11:20 AM

## 2023-08-24 NOTE — Telephone Encounter (Signed)
 Per Dr. [name], Patient is to be scheduled for  Holmium Laser Enucleation of the Prostate   [name] was contacted and possible surgical dates were discussed, Friday May 30th, 2025 was agreed upon for surgery.   Patient was instructed that Dr. [name] will require them to provide a pre-op UA & CX prior to surgery. This was ordered and scheduled drop off appointment was made for 08/27/2023.    Patient was directed to call [phone] between 1-3pm the day before surgery to find out surgical arrival time.  Instructions were given not to eat or drink from midnight on the night before surgery and have a driver for the day of surgery. On the surgery day patient was instructed to enter through the [facility] entrance of [facility] report the Same Day Surgery desk.   Pre-Admit Testing will be in contact via phone to set up an interview with the anesthesia team to review your history and medications prior to surgery.   Reminder of this information was sent via MyChart to the patient.

## 2023-08-25 ENCOUNTER — Telehealth: Payer: Self-pay | Admitting: Urology

## 2023-08-25 ENCOUNTER — Telehealth: Payer: Self-pay | Admitting: Sports Medicine

## 2023-08-25 DIAGNOSIS — G2581 Restless legs syndrome: Secondary | ICD-10-CM

## 2023-08-25 DIAGNOSIS — F39 Unspecified mood [affective] disorder: Secondary | ICD-10-CM

## 2023-08-25 MED ORDER — ALPRAZOLAM 0.5 MG PO TABS: 0.5000 mg | ORAL_TABLET | Freq: Two times a day (BID) | ORAL | 0 refills | Status: DC | PRN

## 2023-08-25 MED ORDER — ROPINIROLE HCL 1 MG PO TABS: ORAL_TABLET | ORAL | 3 refills | Status: DC

## 2023-08-25 NOTE — Telephone Encounter (Signed)
 Pt called stated he has blood in urine he stated the whole tube is bright red. Pt is concerned and would like a phone call back please. Thank You.

## 2023-08-25 NOTE — Telephone Encounter (Signed)
 Brandon Riley has had a great deal of issues with urinary obstruction, he has had several foleys, he has had them removed, he is ended up in the ED multiple times for Foley replacement.  It sounds like he is up for TURP, he is having some increasing anxiety so we will do a short course of alprazolam.

## 2023-08-26 ENCOUNTER — Ambulatory Visit: Admitting: Rehabilitative and Restorative Service Providers"

## 2023-08-26 ENCOUNTER — Encounter: Payer: Self-pay | Admitting: Rehabilitative and Restorative Service Providers"

## 2023-08-26 DIAGNOSIS — M25562 Pain in left knee: Secondary | ICD-10-CM | POA: Diagnosis not present

## 2023-08-26 DIAGNOSIS — M6281 Muscle weakness (generalized): Secondary | ICD-10-CM | POA: Diagnosis not present

## 2023-08-26 DIAGNOSIS — R2689 Other abnormalities of gait and mobility: Secondary | ICD-10-CM

## 2023-08-26 NOTE — Therapy (Signed)
 OUTPATIENT PHYSICAL THERAPY LOWER EXTREMITY TREATMENT  Patient Name: Brandon Riley MRN: [id number] DOB:May 02, 1960, 63 y.o., male Today's Date: 08/26/2023  END OF SESSION:  PT End of Session - 08/26/23 1111     Visit Number 10    Number of Visits 16    Date for PT Re-Evaluation 09/20/23    Authorization Type aetna    PT Start Time 1104    PT Stop Time 1145    PT Time Calculation (min) 41 min    Activity Tolerance Patient tolerated treatment well    Behavior During Therapy WFL for tasks assessed/performed            Past Medical History:  Diagnosis Date   Arthritis    GERD (gastroesophageal reflux disease)    Hyperlipemia    Hypertension    Restless leg syndrome    Sleep apnea    w cpap   Past Surgical History:  Procedure Laterality Date   CHONDROPLASTY Left 10/28/2018   Procedure: CHONDROPLASTY;  Surgeon: Micheline Ahr, MD;  Location: [facility];  Service: Orthopedics;  Laterality: Left;   KNEE ARTHROSCOPY WITH MEDIAL MENISECTOMY Left 10/28/2018   Procedure: LEFT KNEE ARTHROSCOPY WITH MEDIAL MENISECTOMY;  Surgeon: Micheline Ahr, MD;  Location: [facility];  Service: Orthopedics;  Laterality: Left;   RECONSTRUCTION OF NOSE  2019   TOTAL KNEE ARTHROPLASTY Left 07/20/2023   Procedure: ARTHROPLASTY, KNEE, TOTAL;  Surgeon: Murleen Arms, MD;  Location: [facility];  Service: Orthopedics;  Laterality: Left;   WISDOM TOOTH EXTRACTION  2012   Patient Active Problem List   Diagnosis Date Noted   Sacral lesion 08/04/2023   Urinary retention 08/03/2023   Localized osteoarthritis of left knee 07/20/2023   Preoperative clearance 05/12/2023   COVID-19 03/30/2023   Restless leg syndrome 10/08/2022   Dermatitis 09/01/2022   Elevated PSA 09/07/2020   Polyarthralgia 08/31/2020   Steatohepatitis 05/16/2020   Open fracture of left thumb 01/31/2020   GERD (gastroesophageal reflux disease) 11/07/2016   Primary osteoarthritis of right wrist 09/29/2016    Mood disorder (HCC) 12/17/2015   Left insertional Achilles tendinosis 11/05/2015   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   Obstructive sleep apnea on BiPAP with periodic limb movement disorder 06/05/2014   Obesity 05/02/2013   Annual physical exam 04/04/2013   Primary osteoarthritis of both knees with pseudogout 04/04/2013   Degenerative disc disease, cervical 04/04/2013   Hyperlipidemia 04/04/2013   Benign essential hypertension 04/04/2013   Lumbar degenerative disc disease 04/04/2013    PCP: Annemarie Kil MD REFERRING PROVIDER: Priscille Brought, MD REFERRING DIAG:  Diagnosis  [phone] (ICD-10-CM) - S/P total knee replacement   THERAPY DIAG:  Acute pain of left knee  Other abnormalities of gait and mobility  Muscle weakness (generalized)  Rationale for Evaluation and Treatment: Rehabilitation  ONSET DATE: 07/20/23 surgical date  SUBJECTIVE:  SUBJECTIVE STATEMENT: The patient reports he had a bad day yesterday due to bladder/urinary issues.   EVAL:The patient reports "both knees are shot" having meniscus repair in the past. He expects pain and is ready to start rehab. He had 1.5 years of pain prior to surgery. His surgery was on 07/20/23 for L TKR and post surgical course was complicated by urinary retention-- he is using a foley cath at this time and has appt with urology next week. He is using ice for pain control and meds.   PERTINENT HISTORY: Covid, RLS, OSA on bipap, HTN, DDD PAIN:  Are you having pain? Yes:  NPRS scale:  1/10 Pain location: Lt knee Pain description: sore Aggravating factors: movement, swelling Relieving factors: pain meds, ice  PRECAUTIONS: None  WEIGHT BEARING RESTRICTIONS:  WBAT  FALLS:  Has patient fallen in last 6 months? Yes, 3--felt balance was off (works on this at [employer])  LIVING ENVIRONMENT: Lives with: lives with their spouse Lives in: House/apartment Stairs: Yes: Internal: 17, basement steps; one rail and 2 walls and  External: 2 steps; none Has following equipment at home: Otho Blitz - 2 wheeled  OCCUPATION: retired, Product/process development scientist   PATIENT GOALS: Good quality of life-- be able to do 7/8 of what I used to do.  NEXT MD VISIT: 08/04/23  OBJECTIVE:  Note: Objective measures were completed at Evaluation unless otherwise noted. PATIENT SURVEYS:  LEFS 10% COGNITION: Overall cognitive status: Within functional limits for tasks assessed   EDEMA:  Circumferential: 37 on R and 44 on L PALPATION: Edema L knee with dressing intact LOWER EXTREMITY ROM: Active ROM Right eval Left eval Left 07/27/23 Left 08/03/23 08/13/23 Left  08/17/23 Left 08/20/23 Left  08/24/23 Left 08/26/23  Hip flexion           Hip extension           Hip abduction           Hip adduction           Hip internal rotation           Hip external rotation           Knee flexion  AROM 75 deg heel slide AAROM 94 deg AAROM 102 deg supine  Sitting AAROM 103 deg 90 AROM 104 AROM heel slide 105 110   Knee extension  -9 PROM -12 AROM  -6 deg supine   Lacking 4 in supine  2 degrees (from full extension) prone hang  Ankle dorsiflexion           Ankle plantarflexion           Ankle inversion           Ankle eversion            (Blank rows = not tested) LOWER EXTREMITY MMT: MMT Right eval Left eval Left 08/17/23  Hip flexion     Hip extension     Hip abduction     Hip adduction     Hip internal rotation     Hip external rotation     Knee flexion     Knee extension  LAQ to -38 Extensor Lag -20  Ankle dorsiflexion     Ankle plantarflexion     Ankle inversion     Ankle eversion      (Blank rows = not tested) FUNCTIONAL TESTS:  Gait speed = 1.23 ft/sec GAIT: Distance walked: 100 ft Assistive device utilized: Environmental consultant - 2 wheeled Level of assistance: Modified independence Comments: stairs with RW since no rails at home with step to pattern and SBA for safety  [facility] Adult PT Treatment:                                                 DATE: 08/26/23 Therapeutic Exercise: Bike doing 12 full revolutions x 3 minute warm up Prone Knee flexion with passive overpressure Knee hang  Supine Extension moment with ankle supported on towel roll during STM Sitting Leg press  190# x 10 then 215# x 10 reps Sit<>stand with focus on L knee staying posterior to R knee Standing Lateral step ups to 4" step Anterior step up to 6" step x 10 reps adding a band for resistance at hips Marching x 30 feet x 3 reps Backwards walking x 30 feet x 3 reps Manual Therapy: STM medial HS, patellar mobilization II-III and scar tissue massage Gait: Treadmill x 5 minutes up to 2.0 mph and p to 4% incline  Into hallway after treadmill with more normalized gait pattern x 200 ft   [facility] Adult PT Treatment:                                                DATE: 08/24/23 Therapeutic Exercise: Warm up with bike: x 3 minutes *patient got phone call to schedule urology consult for surgery during visit-- PT held time and added it to end to ensure he got a full session.  *Made 2 full revolutions Prone PROM knee flexion with overpressure (notes discomfort along anterior knee) Knee hand with passive overpressure Glut extension x 10 reps Standing Lateral 4" step up L LE x 8 reps with significant fatigue Anterior steps ups L LE 4" mving R LE to 8" step with intermittent UE support Supine SLR x 10 reps Knee to chest with flexion overpressure Heel slide with 110 deg PROM flexion  Manual Therapy: STM medial HS, patellar mobilizations grade II-III all planes, scar tissue massage (on proximal 1/2 due to distal 1/2 not ready) Muscle roller Hs in prone Gait: With cues for longer stride length in hallway-- provided anterior L hip cues and shoulder cues for arm swing to encourage rotation of hips in transverse plane Marching anteriorly x 30 feet x 3 reps Backwards walking x 10 feet x 5 reps Stair negotiation with reciprocal ascending-- still painful to descend  reciprocal   [facility] Adult PT Treatment:                                                DATE: 08/20/23 Therapeutic Exercise: Prone knee hang x 2 minutes 2 lbs Updated HEP  Manual Therapy: Lt patellar mobilizations all planes grade II-III Lt knee PROM flexion/extension to tolerance Neuromuscular re-ed: TKE blue band 2 x 10  Therapeutic Activity: Leg press 1 x 10 @ 145 lbs, 1 x 10 @ 170 lbs, 1 x 10 @ 190 lbs  Backward walking 2 x 15 ft    [facility] Adult PT Treatment:                                                DATE: 08/17/23 Therapeutic Exercise: Warmup bike x 4 minutes rocking ant/posterior to tolerable end ranges Supine AAROM knee flexion x 10 reps sliding L LE along wall Knee to chest with passive overpressure Thomas test stretch with overpressure for quad Prone Passive overpressure into extension Standing Step ups x 6" x 10 reps with intermittent UE support Heel raises bilateral to unilateral with UE support Sitting Terminal knee extension LAQ Manual Therapy: Patellar mobilizations lateral and superior grade II-III Hamstring STM  prone with knee in extension with overpressure using IASTM Therapeutic Activity: Foot in stride position with anterior/posterior weight shifting Backwards walking Marching Gait: Gait with heel strike and tactile cues for hip rotation in transverse plane x 180 ft  [facility] Adult PT Treatment:                                                DATE: 08/13/23 Therapeutic Exercise: Heel slides x 10  HS curl green band 2 x 10  Manual Therapy: Lt knee PROM flexion/extension to tolerance Lt patellar mobilizations grade II-III all planes Neuromuscular re-ed: LAQ 2 x 10  Therapeutic Activity: Sit to stand 2 x 10  Step ups 6 inch x 10                              PATIENT EDUCATION:  Education details: HEP update  Person educated: Patient,  Education method: Explanation, demo, handout Education comprehension: verbalized understanding, returned demo   HOME  EXERCISE PROGRAM: Access Code: [email or web address] URL: [email or web address] Date: 08/20/2023 Prepared by: Forrestine Ike  Exercises - Seated Heel Slide  - 2 x daily - 7 x weekly - 2 sets - 5 reps - Seated Long Arc Quad with Strap  - 1 x daily - 7 x weekly - 2 sets - 5 reps - Supine Heel Slide  - 1 x daily - 7 x weekly - 2 sets - 10 reps - Supine Quad Set  - 1 x daily - 7 x weekly - 2 sets - 10 reps - Supine Hip Abduction  - 1 x daily - 7 x weekly - 2 sets - 10 reps - Supine Straight Leg Raises  - 1 x daily - 7 x weekly - 2 sets - 10 reps - Heel Raises with Counter Support  - 1 x daily - 7 x weekly - 2 sets - 12 reps - Mini Squats at Table  - 1 x daily - 7 x weekly - 2 sets - 12 reps - Standing Terminal Knee Extension with Resistance  - 1 x daily - 7 x weekly - 2 sets - 10 reps - Prone Knee Extension with Ankle Weight  - 1 x daily - 7 x weekly - 1 sets - max hold  ASSESSMENT:  CLINICAL IMPRESSION: The patient is continuing to make progress in PT. On Monday of this week, he fatigued with lateral step ups, but today tolerated to 4" step without difficulty. We have focused our sessions this week on normalizing gait mechanics-- treadmill helped today, and continuing to gain ROM and strength.  EVAL: Patient is a 63 y.o. male who was seen today for physical therapy evaluation and treatment for L TKR on 07/20/23. He presents today with impairments in ROM, strength, edema, pain, and functional limitations s/p surgery. Patient's hospital course complicated by urinary retention. He is f/u in [facility] with urology. PT to progress patient to tolerance with goal of returning to prior functional status with reduced pain.    OBJECTIVE IMPAIRMENTS: Abnormal gait, decreased activity tolerance, decreased balance, decreased endurance, decreased ROM, decreased strength, hypomobility, increased edema, increased fascial restrictions, impaired flexibility, and pain.   GOALS: Goals reviewed with patient?  Yes  SHORT TERM GOALS: Target date: 08/21/23  The patient will be indep with initial HEP Baseline: initiated at eval Goal  status: MET  2.  The patient will improve LEFS up to 30% to demonstrate improved functional abilities. Baseline:  10% Goal status: INITIAL  3.   The patient will report pain with walking < or equal to 3/10.  Baseline:  6-7/10 Goal status:MET- 4/28- patient notes "sore" after gait  4.  The patient will improve AROM L knee to 6 to 100 degrees. Baseline:  12 to 75 Goal status: MET  LONG TERM GOALS: Target date: 09/20/23  The patient will be indep with progression of HEP. Baseline:  initiated at eval Goal status: INITIAL  2.  The patient will improve LEFS up to 50% to demonstrate improved functional abilities. Baseline: 10% Goal status: INITIAL  3.  The patient will improve AROM L knee to 110 degrees flexion. Baseline:  75 degrees Goal status: INITIAL  4.  The patient will improve AROM L knee to -3 degrees full extension. Baseline:  -12 AROM Goal status: INITIAL  5.  The patient will negotiate steps mod indep with reciprocal pattern x 4 steps with one rail. Baseline:  step to pattern with walker and SBA. Goal status: INITIAL  6.  The patient will improve gait speed to > or equal to 2.5 ft/sec.  Baseline: 1.23 ft/sec Goal status: INITIAL  PLAN:  PT FREQUENCY: 2x/week  PT DURATION: 8 weeks  PLANNED INTERVENTIONS: 97164- PT Re-evaluation, 97110-Therapeutic exercises, 97530- Therapeutic activity, [id number]- Neuromuscular re-education, 97535- Self Care, 40981- Manual therapy, [id number]- Gait training, [phone]- Electrical stimulation (unattended), 97016- Vasopneumatic device, 97033- Ionotophoresis 4mg /ml Dexamethasone , Patient/Family education, Balance training, Stair training, Taping, Dry Needling, Joint mobilization, DME instructions, and Cryotherapy  PLAN FOR NEXT SESSION:  progress therex to tolerance. Gait training working on normalizing gait mechanics.  Balance activity.  *Check STG for LEFS next visit*  Ladene Allocca, PT 08/26/23 11:11 AM

## 2023-08-27 ENCOUNTER — Telehealth: Payer: Self-pay | Admitting: Urology

## 2023-08-27 ENCOUNTER — Ambulatory Visit: Admitting: Urology

## 2023-08-27 VITALS — BP 125/84 | HR 93 | Ht 68.0 in | Wt 231.0 lb

## 2023-08-27 DIAGNOSIS — N138 Other obstructive and reflux uropathy: Secondary | ICD-10-CM | POA: Diagnosis not present

## 2023-08-27 DIAGNOSIS — N401 Enlarged prostate with lower urinary tract symptoms: Secondary | ICD-10-CM

## 2023-08-27 DIAGNOSIS — R339 Retention of urine, unspecified: Secondary | ICD-10-CM | POA: Diagnosis not present

## 2023-08-27 DIAGNOSIS — Z01818 Encounter for other preprocedural examination: Secondary | ICD-10-CM | POA: Diagnosis not present

## 2023-08-27 NOTE — Telephone Encounter (Signed)
 Patient is scheduled for HOLEP surgery with Dr. [name] on 09/25/23 and Dr. Willye Harvey referred the patient. Patient would like to follow up in the [facility] location to have his foley removed on 09/28/23. Per Dr. [name] OK to schedule this in [facility]. No V&T just need to removal the foley. Moira Andrews

## 2023-08-27 NOTE — Progress Notes (Signed)
 08/27/23 12:22 PM   Brandon Riley 1960/11/23 [id number]  CC: BPH and urinary retention, elevated PSA  HPI: 63 year old male referred from Dr. Willye Harvey for BPH and Foley dependent urinary retention and consideration of HOLEP.  He underwent a knee procedure in March and has had persistent urinary retention since that time and failed multiple voiding trials despite alpha blockers.  He denies any significant urinary symptoms prior to that surgery.  He has a long history of elevated PSA with a negative biopsy at [facility] in 2022, and prostate volume at that time was 125g.  Most recent PSA from January 2025 stable at 5.5 with very reassuring 29% free, BPH most likely etiology of elevated PSA.   PMH: Past Medical History:  Diagnosis Date   Arthritis    GERD (gastroesophageal reflux disease)    Hyperlipemia    Hypertension    Restless leg syndrome    Sleep apnea    w cpap    Surgical History: Past Surgical History:  Procedure Laterality Date   CHONDROPLASTY Left 10/28/2018   Procedure: CHONDROPLASTY;  Surgeon: Micheline Ahr, MD;  Location: [facility];  Service: Orthopedics;  Laterality: Left;   KNEE ARTHROSCOPY WITH MEDIAL MENISECTOMY Left 10/28/2018   Procedure: LEFT KNEE ARTHROSCOPY WITH MEDIAL MENISECTOMY;  Surgeon: Micheline Ahr, MD;  Location: [facility];  Service: Orthopedics;  Laterality: Left;   RECONSTRUCTION OF NOSE  2019   TOTAL KNEE ARTHROPLASTY Left 07/20/2023   Procedure: ARTHROPLASTY, KNEE, TOTAL;  Surgeon: Murleen Arms, MD;  Location: [facility];  Service: Orthopedics;  Laterality: Left;   WISDOM TOOTH EXTRACTION  2012     Family History: Family History  Problem Relation Age of Onset   Dementia Mother    Heart Problems Mother        Double bypass   Diabetes Mother    Other Father        Prostate Issues    Social History:  reports that he has never smoked. He has never been exposed to tobacco smoke. He has never used smokeless  tobacco. He reports current alcohol  use. He reports that he does not use drugs.  Physical Exam: BP 125/84   Pulse 93   Ht 5\' 8"  (1.727 m)   Wt 231 lb (104.8 kg)   BMI 35.12 kg/m    Constitutional:  Alert and oriented, No acute distress. Cardiovascular: No clubbing, cyanosis, or edema. Respiratory: Normal respiratory effort, no increased work of breathing. GI: Abdomen is soft, nontender, nondistended, no abdominal masses GU: Foley with clear yellow urine  Laboratory Data: Reviewed, see HPI   Assessment & Plan:   63 year old male with BPH and Foley dependent urinary retention, history of negative prostate biopsy, prostate reportedly measured 125 g on TRUS from 2022 at [facility].  Here today to discuss HOLEP for bladder management.  We discussed the risks and benefits of HoLEP at length.  The procedure requires general anesthesia and takes 1 to 2 hours, and a holmium laser is used to enucleate the prostate and push this tissue into the bladder.  A morcellator is then used to remove this tissue, which is sent for pathology.  The vast majority(>95%) of patients are able to discharge the same day with a catheter in place for 2 to 3 days, and will follow-up in clinic for a voiding trial.  We specifically discussed the risks of bleeding, infection, retrograde ejaculation, temporary urgency and urge incontinence, very low risk of long-term incontinence, urethral stricture/bladder  neck contracture, pathologic evaluation of prostate tissue and possible detection of prostate cancer or other malignancy, and possible need for additional procedures.  He had a number of concerns about anxiety and PTSD after his multiple ER visits and catheter problems as well as issues with his spinal anesthetic for his knee procedure.  We can discuss with anesthesia day of surgery giving Valium and preop, potentially additional sedation if needed  Urine today sent for preop culture Schedule HoLEP   Jay Meth,  MD 08/27/2023  [facility] [address], [address] [place], [place] [address] [phone]

## 2023-08-27 NOTE — Patient Instructions (Signed)

## 2023-08-27 NOTE — H&P (View-Only) (Signed)
 08/27/23 12:22 PM   Brandon Riley 1960/11/23 [id number]  CC: BPH and urinary retention, elevated PSA  HPI: 63 year old male referred from Dr. Willye Harvey for BPH and Foley dependent urinary retention and consideration of HOLEP.  He underwent a knee procedure in March and has had persistent urinary retention since that time and failed multiple voiding trials despite alpha blockers.  He denies any significant urinary symptoms prior to that surgery.  He has a long history of elevated PSA with a negative biopsy at [facility] in 2022, and prostate volume at that time was 125g.  Most recent PSA from January 2025 stable at 5.5 with very reassuring 29% free, BPH most likely etiology of elevated PSA.   PMH: Past Medical History:  Diagnosis Date   Arthritis    GERD (gastroesophageal reflux disease)    Hyperlipemia    Hypertension    Restless leg syndrome    Sleep apnea    w cpap    Surgical History: Past Surgical History:  Procedure Laterality Date   CHONDROPLASTY Left 10/28/2018   Procedure: CHONDROPLASTY;  Surgeon: Micheline Ahr, MD;  Location: [facility];  Service: Orthopedics;  Laterality: Left;   KNEE ARTHROSCOPY WITH MEDIAL MENISECTOMY Left 10/28/2018   Procedure: LEFT KNEE ARTHROSCOPY WITH MEDIAL MENISECTOMY;  Surgeon: Micheline Ahr, MD;  Location: [facility];  Service: Orthopedics;  Laterality: Left;   RECONSTRUCTION OF NOSE  2019   TOTAL KNEE ARTHROPLASTY Left 07/20/2023   Procedure: ARTHROPLASTY, KNEE, TOTAL;  Surgeon: Murleen Arms, MD;  Location: [facility];  Service: Orthopedics;  Laterality: Left;   WISDOM TOOTH EXTRACTION  2012     Family History: Family History  Problem Relation Age of Onset   Dementia Mother    Heart Problems Mother        Double bypass   Diabetes Mother    Other Father        Prostate Issues    Social History:  reports that he has never smoked. He has never been exposed to tobacco smoke. He has never used smokeless  tobacco. He reports current alcohol  use. He reports that he does not use drugs.  Physical Exam: BP 125/84   Pulse 93   Ht 5\' 8"  (1.727 m)   Wt 231 lb (104.8 kg)   BMI 35.12 kg/m    Constitutional:  Alert and oriented, No acute distress. Cardiovascular: No clubbing, cyanosis, or edema. Respiratory: Normal respiratory effort, no increased work of breathing. GI: Abdomen is soft, nontender, nondistended, no abdominal masses GU: Foley with clear yellow urine  Laboratory Data: Reviewed, see HPI   Assessment & Plan:   63 year old male with BPH and Foley dependent urinary retention, history of negative prostate biopsy, prostate reportedly measured 125 g on TRUS from 2022 at [facility].  Here today to discuss HOLEP for bladder management.  We discussed the risks and benefits of HoLEP at length.  The procedure requires general anesthesia and takes 1 to 2 hours, and a holmium laser is used to enucleate the prostate and push this tissue into the bladder.  A morcellator is then used to remove this tissue, which is sent for pathology.  The vast majority(>95%) of patients are able to discharge the same day with a catheter in place for 2 to 3 days, and will follow-up in clinic for a voiding trial.  We specifically discussed the risks of bleeding, infection, retrograde ejaculation, temporary urgency and urge incontinence, very low risk of long-term incontinence, urethral stricture/bladder  neck contracture, pathologic evaluation of prostate tissue and possible detection of prostate cancer or other malignancy, and possible need for additional procedures.  He had a number of concerns about anxiety and PTSD after his multiple ER visits and catheter problems as well as issues with his spinal anesthetic for his knee procedure.  We can discuss with anesthesia day of surgery giving Valium and preop, potentially additional sedation if needed  Urine today sent for preop culture Schedule HoLEP   Jay Meth,  MD 08/27/2023  [facility] [address], [address] [place], [place] [address] [phone]

## 2023-08-28 LAB — MICROSCOPIC EXAMINATION: RBC, Urine: >30 /HPF — AB (ref 0–2)

## 2023-08-28 LAB — URINALYSIS, COMPLETE
Bilirubin, UA: NEGATIVE
Glucose, UA: NEGATIVE
Ketones, UA: NEGATIVE
Leukocytes,UA: NEGATIVE
Nitrite, UA: NEGATIVE
Protein,UA: NEGATIVE
Specific Gravity, UA: 1.03 (ref 1.005–1.030)
Urobilinogen, Ur: 0.2 mg/dL (ref 0.2–1.0)
pH, UA: 6 (ref 5.0–7.5)

## 2023-08-31 ENCOUNTER — Encounter

## 2023-08-31 ENCOUNTER — Telehealth: Payer: Self-pay | Admitting: Sports Medicine

## 2023-08-31 ENCOUNTER — Ambulatory Visit (INDEPENDENT_AMBULATORY_CARE_PROVIDER_SITE_OTHER)

## 2023-08-31 ENCOUNTER — Ambulatory Visit

## 2023-08-31 DIAGNOSIS — M7122 Synovial cyst of popliteal space [Baker], left knee: Secondary | ICD-10-CM | POA: Diagnosis not present

## 2023-08-31 DIAGNOSIS — R590 Localized enlarged lymph nodes: Secondary | ICD-10-CM | POA: Diagnosis not present

## 2023-08-31 DIAGNOSIS — Z96652 Presence of left artificial knee joint: Secondary | ICD-10-CM

## 2023-08-31 DIAGNOSIS — M1712 Unilateral primary osteoarthritis, left knee: Secondary | ICD-10-CM

## 2023-08-31 DIAGNOSIS — M7989 Other specified soft tissue disorders: Secondary | ICD-10-CM

## 2023-08-31 DIAGNOSIS — C412 Malignant neoplasm of vertebral column: Secondary | ICD-10-CM | POA: Insufficient documentation

## 2023-08-31 NOTE — Assessment & Plan Note (Signed)
 Brandon Riley is status post left total knee arthroplasty in March, he has started having some oozing from his incision, he also has swelling and pain in his left calf. We will order a stat DVT ultrasound and he will be seeing his surgeon in about an hour to an hour and a half.

## 2023-08-31 NOTE — Telephone Encounter (Signed)
 History of total knee arthroplasty, left Brandon Riley is status post left total knee arthroplasty in March, he has started having some oozing from his incision, he also has swelling and pain in his left calf. We will order a stat DVT ultrasound and he will be seeing his surgeon in about an hour to an hour and a half.  Malignant neoplasm of spine (HCC) Brandon Riley has had a really tough time, in addition to difficulty with BPH with biopsies negative for cancer, urinary obstruction, multiple Foley placements, and the need for a prostatic enucleation, we noted an abnormal focus in the right sacrum. This was initially partially seen on lumbar spine MRI, we added to sacral MRI with and without contrast, there is appear to be a lesion that may be a primary or metastatic focus. I will go ahead and set him up for a PET scan as well as CT imaging chest, abdomen, pelvis, I would like him to have an IR guided biopsy if this lesion seems accessible, I would also like him to have consultation with oncology. I discussed this in detail with Brandon Riley on the phone today.

## 2023-08-31 NOTE — Assessment & Plan Note (Signed)
 Brandon Riley has had a really tough time, in addition to difficulty with BPH with biopsies negative for cancer, urinary obstruction, multiple Foley placements, and the need for a prostatic enucleation, we noted an abnormal focus in the right sacrum. This was initially partially seen on lumbar spine MRI, we added to sacral MRI with and without contrast, there is appear to be a lesion that may be a primary or metastatic focus. I will go ahead and set him up for a PET scan as well as CT imaging chest, abdomen, pelvis, I would like him to have an IR guided biopsy if this lesion seems accessible, I would also like him to have consultation with oncology. I discussed this in detail with Autry Legions on the phone today.

## 2023-09-02 ENCOUNTER — Ambulatory Visit (INDEPENDENT_AMBULATORY_CARE_PROVIDER_SITE_OTHER): Admitting: Sports Medicine

## 2023-09-02 ENCOUNTER — Encounter

## 2023-09-02 ENCOUNTER — Encounter: Payer: Self-pay | Admitting: Sports Medicine

## 2023-09-02 VITALS — BP 127/74 | HR 95 | Resp 20 | Ht 68.0 in

## 2023-09-02 DIAGNOSIS — R7303 Prediabetes: Secondary | ICD-10-CM | POA: Diagnosis not present

## 2023-09-02 DIAGNOSIS — C412 Malignant neoplasm of vertebral column: Secondary | ICD-10-CM

## 2023-09-02 LAB — COMPREHENSIVE METABOLIC PANEL WITH GFR
ALT: 23 IU/L (ref 0–44)
AST: 16 IU/L (ref 0–40)
Albumin: 4.4 g/dL (ref 3.9–4.9)
Alkaline Phosphatase: 97 IU/L (ref 44–121)
BUN/Creatinine Ratio: 19 (ref 10–24)
BUN: 13 mg/dL (ref 8–27)
Bilirubin Total: 0.7 mg/dL (ref 0.0–1.2)
CO2: 22 mmol/L (ref 20–29)
Calcium: 9.3 mg/dL (ref 8.6–10.2)
Chloride: 102 mmol/L (ref 96–106)
Creatinine, Ser: 0.7 mg/dL — ABNORMAL LOW (ref 0.76–1.27)
Globulin, Total: 3 g/dL (ref 1.5–4.5)
Glucose: 242 mg/dL — ABNORMAL HIGH (ref 70–99)
Potassium: 3.9 mmol/L (ref 3.5–5.2)
Sodium: 139 mmol/L (ref 134–144)
Total Protein: 7.4 g/dL (ref 6.0–8.5)
eGFR: 104 mL/min/{1.73_m2} (ref >59)

## 2023-09-02 LAB — CULTURE, URINE COMPREHENSIVE

## 2023-09-02 LAB — CBC WITH DIFFERENTIAL/PLATELET
Basophils Absolute: 0 10*3/uL (ref 0.0–0.2)
Basos: 0 %
EOS (ABSOLUTE): 0 10*3/uL (ref 0.0–0.4)
Eos: 0 %
Hematocrit: 40.3 % (ref 37.5–51.0)
Hemoglobin: 14 g/dL (ref 13.0–17.7)
Immature Granulocytes: 1 %
Lymphocytes Absolute: 1.6 10*3/uL (ref 0.7–3.1)
Lymphs: 16 %
MCH: 30.5 pg (ref 26.6–33.0)
MCHC: 34.7 g/dL (ref 31.5–35.7)
MCV: 88 fL (ref 79–97)
Monocytes Absolute: 0.8 10*3/uL (ref 0.1–0.9)
Monocytes: 9 %
Neutrophils Absolute: 7.2 10*3/uL — ABNORMAL HIGH (ref 1.4–7.0)
Neutrophils: 74 %
Platelets: 269 10*3/uL (ref 150–450)
RBC: 4.59 x10E6/uL (ref 4.14–5.80)
RDW: 12.1 % (ref 11.6–15.4)
WBC: 9.7 10*3/uL (ref 3.4–10.8)

## 2023-09-02 LAB — SEDIMENTATION RATE: Sed Rate: 18 mm/h (ref 0–30)

## 2023-09-02 NOTE — Progress Notes (Addendum)
    Procedures performed today:    None.  Independent interpretation of notes and tests performed by another provider:   None.  Brief History, Exam, Impression, and Recommendations:    Malignant neoplasm of spine (HCC) Brandon Riley has had a really tough time, in addition to difficulty with BPH with biopsies negative for cancer, urinary obstruction, multiple Foley placements, and the need for a prostatic enucleation, we noted an abnormal focus in the right sacrum. This was initially partially seen on lumbar spine MRI, we added to sacral MRI with and without contrast, there is appear to be a lesion that may be a primary or metastatic focus. I will go ahead and set him up for a PET scan as well as CT imaging chest, abdomen, pelvis, I would like him to have an IR guided biopsy if this lesion seems accessible, I would also like him to have consultation with oncology. I discussed this in detail with Brandon Riley on the phone today.  Update: Brandon Riley came in person today, we discussed the case, we discussed the rash on his knee status post arthroplasty, he has seen his Careers adviser. The rash does appear somewhat petechial so we will get some labs today. We also discussed his urinary obstruction, he does have a prostatic enucleation scheduled. He has not yet seen his oncologist and has not been scheduled. We discussed the importance of getting a tissue biopsy to determine the type of cells involved, this has already been ordered with interventional radiology, we also discussed the need for a CT chest abdomen and pelvis to look for additional metastatic disease as well as the PET scan to look for hypermetabolic foci.  He understands that I have not given him a diagnosis of cancer or malignancy just yet however the findings on the MRI are highly concerning for it.  We are not aware of a primary lesion yet.  Prediabetes Random blood sugar over 260, adding A1c  I spent 30 minutes of total time managing this patient today,  this includes chart review, face to face, and non-face to face time.  ____________________________________________ Joselyn Nicely. Sandy Crumb, M.D., ABFM., CAQSM., AME. Primary Care and Sports Medicine [facility]  Adjunct Professor of [facility] Medicine  [employer]  Restaurant manager, fast food

## 2023-09-02 NOTE — Addendum Note (Signed)
 Addended by: Gean Keels on: 09/02/2023 01:09 PM   Modules accepted: Orders

## 2023-09-02 NOTE — Assessment & Plan Note (Signed)
 Random blood sugar over 260, adding A1c

## 2023-09-02 NOTE — Assessment & Plan Note (Addendum)
 Tavyon has had a really tough time, in addition to difficulty with BPH with biopsies negative for cancer, urinary obstruction, multiple Foley placements, and the need for a prostatic enucleation, we noted an abnormal focus in the right sacrum. This was initially partially seen on lumbar spine MRI, we added to sacral MRI with and without contrast, there is appear to be a lesion that may be a primary or metastatic focus. I will go ahead and set him up for a PET scan as well as CT imaging chest, abdomen, pelvis, I would like him to have an IR guided biopsy if this lesion seems accessible, I would also like him to have consultation with oncology. I discussed this in detail with Autry Legions on the phone today.  Update: Randol came in person today, we discussed the case, we discussed the rash on his knee status post arthroplasty, he has seen his Careers adviser. The rash does appear somewhat petechial so we will get some labs today. We also discussed his urinary obstruction, he does have a prostatic enucleation scheduled. He has not yet seen his oncologist and has not been scheduled. We discussed the importance of getting a tissue biopsy to determine the type of cells involved, this has already been ordered with interventional radiology, we also discussed the need for a CT chest abdomen and pelvis to look for additional metastatic disease as well as the PET scan to look for hypermetabolic foci.  He understands that I have not given him a diagnosis of cancer or malignancy just yet however the findings on the MRI are highly concerning for it.  We are not aware of a primary lesion yet.

## 2023-09-03 ENCOUNTER — Encounter: Payer: Self-pay | Admitting: *Deleted

## 2023-09-03 ENCOUNTER — Ambulatory Visit: Attending: Orthopaedic Surgery

## 2023-09-03 DIAGNOSIS — M25562 Pain in left knee: Secondary | ICD-10-CM | POA: Diagnosis not present

## 2023-09-03 DIAGNOSIS — R2689 Other abnormalities of gait and mobility: Secondary | ICD-10-CM | POA: Insufficient documentation

## 2023-09-03 DIAGNOSIS — M6281 Muscle weakness (generalized): Secondary | ICD-10-CM | POA: Diagnosis not present

## 2023-09-03 NOTE — Therapy (Signed)
 OUTPATIENT PHYSICAL THERAPY LOWER EXTREMITY TREATMENT  Patient Name: Brandon Riley MRN: [id number] DOB:Apr 07, 1961, 63 y.o., male Today's Date: 09/03/2023  END OF SESSION:  PT End of Session - 09/03/23 1059     Visit Number 11    Number of Visits 16    Date for PT Re-Evaluation 09/20/23    Authorization Type aetna    PT Start Time 1100    PT Stop Time 1141    PT Time Calculation (min) 41 min    Activity Tolerance Patient tolerated treatment well    Behavior During Therapy WFL for tasks assessed/performed             Past Medical History:  Diagnosis Date   Arthritis    GERD (gastroesophageal reflux disease)    Hyperlipemia    Hypertension    Restless leg syndrome    Sleep apnea    w cpap   Past Surgical History:  Procedure Laterality Date   CHONDROPLASTY Left 10/28/2018   Procedure: CHONDROPLASTY;  Surgeon: Micheline Ahr, MD;  Location: [facility];  Service: Orthopedics;  Laterality: Left;   KNEE ARTHROSCOPY WITH MEDIAL MENISECTOMY Left 10/28/2018   Procedure: LEFT KNEE ARTHROSCOPY WITH MEDIAL MENISECTOMY;  Surgeon: Micheline Ahr, MD;  Location: [facility];  Service: Orthopedics;  Laterality: Left;   RECONSTRUCTION OF NOSE  2019   TOTAL KNEE ARTHROPLASTY Left 07/20/2023   Procedure: ARTHROPLASTY, KNEE, TOTAL;  Surgeon: Murleen Arms, MD;  Location: [facility];  Service: Orthopedics;  Laterality: Left;   WISDOM TOOTH EXTRACTION  2012   Patient Active Problem List   Diagnosis Date Noted   Malignant neoplasm of spine (HCC) 08/31/2023   Sacral lesion 08/04/2023   Urinary retention 08/03/2023   History of total knee arthroplasty, left 07/20/2023   Preoperative clearance 05/12/2023   COVID-19 03/30/2023   Restless leg syndrome 10/08/2022   Dermatitis 09/01/2022   Prediabetes 07/09/2021   Elevated PSA 09/07/2020   Polyarthralgia 08/31/2020   Steatohepatitis 05/16/2020   Open fracture of left thumb 01/31/2020   GERD (gastroesophageal reflux  disease) 11/07/2016   Primary osteoarthritis of right wrist 09/29/2016   Mood disorder (HCC) 12/17/2015   Left insertional Achilles tendinosis 11/05/2015   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   Obstructive sleep apnea on BiPAP with periodic limb movement disorder 06/05/2014   Obesity 05/02/2013   Annual physical exam 04/04/2013   Primary osteoarthritis of both knees with pseudogout 04/04/2013   Degenerative disc disease, cervical 04/04/2013   Hyperlipidemia 04/04/2013   Benign essential hypertension 04/04/2013   Lumbar degenerative disc disease 04/04/2013    PCP: Annemarie Kil MD REFERRING PROVIDER: Priscille Brought, MD REFERRING DIAG:  Diagnosis  [id number] (ICD-10-CM) - S/P total knee replacement   THERAPY DIAG:  Acute pain of left knee  Other abnormalities of gait and mobility  Muscle weakness (generalized)  Rationale for Evaluation and Treatment: Rehabilitation  ONSET DATE: 07/20/23 surgical date  SUBJECTIVE:  SUBJECTIVE STATEMENT: Patient saw the surgeon on Monday and Tuesday due to oozing at the wound and swelling. Surgeon thinks the swelling,oozing and petechiae was an allergic reaction to the glue. Patient reports it feels like "alligator skin" and the knee is tight. He was put on  prednisone  which seems to help with his symptoms. He was negative for DVT. Patient has f/u next week with surgeon who plans drain the knee. Patient had f/u with Dr. Elva Hamburger yesterday for spinal imaging that was performed due to urinary retention and further imaging and  oncology referral was placed due to findings of lesion in the Rt sacrum.   EVAL:The patient reports "both knees are shot" having meniscus repair in the past. He expects pain and is ready to start rehab. He had 1.5 years of pain prior to surgery. His surgery was on 07/20/23 for L TKR and post surgical course was complicated by urinary retention-- he is using a foley cath at this time and has appt with urology next week. He is  using ice for pain control and meds.   PERTINENT HISTORY: Covid, RLS, OSA on bipap, HTN, DDD PAIN:  Are you having pain? Yes: NPRS scale:  1/10 Pain location: Lt knee Pain description: ache Aggravating factors: movement, swelling Relieving factors: pain meds, ice  PRECAUTIONS: None  WEIGHT BEARING RESTRICTIONS: WBAT  FALLS:  Has patient fallen in last 6 months? Yes, 3--felt balance was off (works on this at [employer])  LIVING ENVIRONMENT: Lives with: lives with their spouse Lives in: House/apartment Stairs: Yes: Internal: 17, basement steps; one rail and 2 walls and External: 2 steps; none Has following equipment at home: Otho Blitz - 2 wheeled  OCCUPATION: retired, Product/process development scientist   PATIENT GOALS: Good quality of life-- be able to do 7/8 of what I used to do.  NEXT MD VISIT: 08/04/23  OBJECTIVE:  Note: Objective measures were completed at Evaluation unless otherwise noted. PATIENT SURVEYS:  LEFS 10% COGNITION: Overall cognitive status: Within functional limits for tasks assessed   EDEMA:  Circumferential: 37 on R and 44 on L PALPATION: Edema L knee with dressing intact LOWER EXTREMITY ROM: Active ROM Right eval Left eval Left 07/27/23 Left 08/03/23 08/13/23 Left  08/17/23 Left 08/20/23 Left  08/24/23 Left 08/26/23  Hip flexion           Hip extension           Hip abduction           Hip adduction           Hip internal rotation           Hip external rotation           Knee flexion  AROM 75 deg heel slide AAROM 94 deg AAROM 102 deg supine  Sitting AAROM 103 deg 90 AROM 104 AROM heel slide 105 110   Knee extension  -9 PROM -12 AROM  -6 deg supine   Lacking 4 in supine  2 degrees (from full extension) prone hang  Ankle dorsiflexion           Ankle plantarflexion           Ankle inversion           Ankle eversion            (Blank rows = not tested) LOWER EXTREMITY MMT: MMT Right eval Left eval Left 08/17/23  Hip flexion     Hip extension     Hip abduction      Hip adduction     Hip internal rotation     Hip external rotation     Knee flexion     Knee extension  LAQ to -38 Extensor Lag -20  Ankle dorsiflexion     Ankle plantarflexion     Ankle inversion     Ankle eversion      (Blank rows = not tested) FUNCTIONAL TESTS:  Gait speed = 1.23 ft/sec GAIT: Distance walked: 100 ft Assistive device utilized: Walker - 2 wheeled Level of assistance: Modified independence Comments:  stairs with RW since no rails at home with step to pattern and SBA for safety [facility] Adult PT Treatment:                                                DATE: 09/03/23 Therapeutic Exercise: Revised and updated HEP, discontinuing knee flexion ROM and strengthening to reduce stress about wound.   Neuromuscular re-ed: Standing hip abduction 2 x 10  Standing hip extension 2 x 10  Tandem 2 x 30 sec SLS x 30 sec   Self Care: Discussed avoiding repetitive flexion activity of the knee to allow for wound healing Signs/symptoms of infection No submerging the LLE Recommended to take daily pictures of wound/rash to determine overall healing  Continue with ice for pain/swelling   [facility] Adult PT Treatment:                                                DATE: 08/26/23 Therapeutic Exercise: Bike doing 12 full revolutions x 3 minute warm up Prone Knee flexion with passive overpressure Knee hang  Supine Extension moment with ankle supported on towel roll during STM Sitting Leg press 190# x 10 then 215# x 10 reps Sit<>stand with focus on L knee staying posterior to R knee Standing Lateral step ups to 4" step Anterior step up to 6" step x 10 reps adding a band for resistance at hips Marching x 30 feet x 3 reps Backwards walking x 30 feet x 3 reps Manual Therapy: STM medial HS, patellar mobilization II-III and scar tissue massage Gait: Treadmill x 5 minutes up to 2.0 mph and p to 4% incline  Into hallway after treadmill with more normalized gait pattern x 200 ft   [facility]  Adult PT Treatment:                                                DATE: 08/24/23 Therapeutic Exercise: Warm up with bike: x 3 minutes *patient got phone call to schedule urology consult for surgery during visit-- PT held time and added it to end to ensure he got a full session.  *Made 2 full revolutions Prone PROM knee flexion with overpressure (notes discomfort along anterior knee) Knee hand with passive overpressure Glut extension x 10 reps Standing Lateral 4" step up L LE x 8 reps with significant fatigue Anterior steps ups L LE 4" mving R LE to 8" step with intermittent UE support Supine SLR x 10 reps Knee to chest with flexion overpressure Heel slide with 110 deg PROM flexion  Manual Therapy: STM medial HS, patellar mobilizations grade II-III all planes, scar tissue massage (on proximal 1/2 due to distal 1/2 not ready) Muscle roller Hs in prone Gait: With cues for longer stride length in hallway-- provided anterior L hip cues and shoulder cues for arm swing to encourage rotation of hips in transverse plane Marching anteriorly x 30 feet x 3 reps Backwards walking x 10 feet x 5 reps Stair negotiation with reciprocal ascending-- still painful to descend reciprocal  PATIENT EDUCATION:  Education details: HEP update; see treatment Person educated: Patient,  Education method: Explanation, demo, handout Education comprehension: verbalized understanding, returned demo   HOME EXERCISE PROGRAM: Access Code: [email or web address] URL: [email or web address] Date: 09/03/2023 Prepared by: Forrestine Ike  Exercises - Seated Long Arc Quad  - 1 x daily - 7 x weekly - 2 sets - 10 reps - Supine Straight Leg Raises  - 1 x daily - 7 x weekly - 2 sets - 10 reps - Heel Raises with Counter Support  - 1 x daily - 7 x weekly - 2 sets - 12 reps - Standing Terminal Knee Extension with Resistance  - 1 x daily - 7 x weekly - 2 sets - 10 reps - Prone Knee Extension with  Ankle Weight  - 1 x daily - 7 x weekly - 1 sets - max hold - Backwards Walking  - 1 x daily - 7 x weekly - 3 sets - 10 reps - Standing Hip Abduction with Counter Support  - 1 x daily - 7 x weekly - 2 sets - 10 reps - Standing Hip Extension with Counter Support  - 1 x daily - 7 x weekly - 2 sets - 10 reps - Tandem Stance  - 1 x daily - 7 x weekly - 3 sets - 30 sec  hold - Single Leg Stance  - 1 x daily - 7 x weekly - 3 sets - 30 sec  hold  ASSESSMENT:  CLINICAL IMPRESSION: Patient arrives to PT with medical updates. Over the weekend his surgical site was oozing and the knee was swollen. Ultrasound was negative for DVT and he was seen by surgeon on Tuesday who prescribed prednisone  as he believes some of his symptoms are an allergic reaction to the glue. He reports an overall improvement in swelling,wound healing, and petechiae since starting medication. His biggest complaint is tightness about the Lt knee stating it feels like "alligator skin." In addition to the knee he had recent f/u with PCP to discuss spinal imaging that was performed due to urinary retention with findings of a lesion that will require further imaging and oncology referral. Today's session focused on revising HEP to discontinue exercises that involved repetitive flexion to avoid further irritation to surgical site and adding additional hip strengthening and balance activity. He tolerated standing strengthening well reporting pulling in the quad with hip strengthening, but otherwise no complaints.   EVAL: Patient is a 63 y.o. male who was seen today for physical therapy evaluation and treatment for L TKR on 07/20/23. He presents today with impairments in ROM, strength, edema, pain, and functional limitations s/p surgery. Patient's hospital course complicated by urinary retention. He is f/u in OP with urology. PT to progress patient to tolerance with goal of returning to prior functional status with reduced pain.    OBJECTIVE  IMPAIRMENTS: Abnormal gait, decreased activity tolerance, decreased balance, decreased endurance, decreased ROM, decreased strength, hypomobility, increased edema, increased fascial restrictions, impaired flexibility, and pain.   GOALS: Goals reviewed with patient? Yes  SHORT TERM GOALS: Target date: 08/21/23  The patient will be indep with initial HEP Baseline: initiated at eval Goal status: MET  2.  The patient will improve LEFS up to 30% to demonstrate improved functional abilities. Baseline:  10% Goal status: INITIAL  3.   The patient will report pain with walking < or equal to 3/10.  Baseline:  6-7/10 Goal status:MET- 4/28- patient notes "sore" after gait  4.  The patient will  improve AROM L knee to 6 to 100 degrees. Baseline:  12 to 75 Goal status: MET  LONG TERM GOALS: Target date: 09/20/23  The patient will be indep with progression of HEP. Baseline:  initiated at eval Goal status: INITIAL  2.  The patient will improve LEFS up to 50% to demonstrate improved functional abilities. Baseline: 10% Goal status: INITIAL  3.  The patient will improve AROM L knee to 110 degrees flexion. Baseline:  75 degrees Goal status: INITIAL  4.  The patient will improve AROM L knee to -3 degrees full extension. Baseline:  -12 AROM Goal status: INITIAL  5.  The patient will negotiate steps mod indep with reciprocal pattern x 4 steps with one rail. Baseline:  step to pattern with walker and SBA. Goal status: INITIAL  6.  The patient will improve gait speed to > or equal to 2.5 ft/sec.  Baseline: 1.23 ft/sec Goal status: INITIAL  PLAN:  PT FREQUENCY: 2x/week  PT DURATION: 8 weeks  PLANNED INTERVENTIONS: 97164- PT Re-evaluation, 97110-Therapeutic exercises, 97530- Therapeutic activity, 97112- Neuromuscular re-education, 97535- Self Care, 59563- Manual therapy, [phone]- Gait training, [phone]- Electrical stimulation (unattended), 97016- Vasopneumatic device, 97033- Ionotophoresis  4mg /ml Dexamethasone , Patient/Family education, Balance training, Stair training, Taping, Dry Needling, Joint mobilization, DME instructions, and Cryotherapy  PLAN FOR NEXT SESSION:  progress therex to tolerance (avoid repetitive flexion at this time) Gait training working on normalizing gait mechanics. Balance activity.  *Check STG for LEFS next visit*; surgical f/u?  Tirsa Gail, PT, DPT, ATC 09/03/23 12:00 PM

## 2023-09-03 NOTE — Progress Notes (Signed)
 Received referral on this patient due to abnormality detected on MRI. Referring physician has already ordered PET, CT CAP and IR for evaluation for biopsy.   Called and spoke to the patient. After speaking to him about current plan, he wishes to delay his new patient appointment until after these have been completed and are available for review. Both scans are currently pending auth.   Will continue to follow for scheduling. Once these are scheduled, will then schedule new patient appointment with Dr Maria Shiner at an appropriate time.   Oncology Nurse Navigator Documentation     09/03/2023    2:00 PM  Oncology Nurse Navigator Flowsheets  Abnormal Finding Date 08/30/2023  Diagnosis Status Additional Work Up  Navigator Follow Up Date: 09/08/2023  Navigator Follow Up Reason: Appointment Review  Navigator Location [facility]  Referral Date to RadOnc/MedOnc 08/31/2023  Navigator Encounter Type Introductory Phone Call  Patient Visit Type MedOnc  Treatment Phase Abnormal Scans  Barriers/Navigation Needs Coordination of Care;Education  Education Other  Interventions Education  Acuity Level 2-Minimal Needs (1-2 Barriers Identified)  Education Method Verbal  Time Spent with Patient 30

## 2023-09-04 LAB — CBC WITH DIFFERENTIAL/PLATELET
Basophils Absolute: 0 10*3/uL (ref 0.0–0.2)
Basos: 0 %
EOS (ABSOLUTE): 0 10*3/uL (ref 0.0–0.4)
Eos: 0 %
Hematocrit: 42.8 % (ref 37.5–51.0)
Hemoglobin: 14.2 g/dL (ref 13.0–17.7)
Immature Grans (Abs): 0.1 10*3/uL (ref 0.0–0.1)
Immature Granulocytes: 1 %
Lymphocytes Absolute: 1.5 10*3/uL (ref 0.7–3.1)
Lymphs: 16 %
MCH: 30.7 pg (ref 26.6–33.0)
MCHC: 33.2 g/dL (ref 31.5–35.7)
MCV: 93 fL (ref 79–97)
Monocytes Absolute: 0.7 10*3/uL (ref 0.1–0.9)
Monocytes: 7 %
Neutrophils Absolute: 7.2 10*3/uL — ABNORMAL HIGH (ref 1.4–7.0)
Neutrophils: 76 %
Platelets: 287 10*3/uL (ref 150–450)
RBC: 4.62 x10E6/uL (ref 4.14–5.80)
RDW: 12.4 % (ref 11.6–15.4)
WBC: 9.6 10*3/uL (ref 3.4–10.8)

## 2023-09-04 LAB — SPECIMEN STATUS REPORT

## 2023-09-04 LAB — SEDIMENTATION RATE

## 2023-09-04 LAB — COMPREHENSIVE METABOLIC PANEL WITH GFR

## 2023-09-04 LAB — HGB A1C W/O EAG: Hgb A1c MFr Bld: 6.1 % — ABNORMAL HIGH (ref 4.8–5.6)

## 2023-09-07 ENCOUNTER — Telehealth: Payer: Self-pay | Admitting: Urology

## 2023-09-07 ENCOUNTER — Ambulatory Visit

## 2023-09-07 NOTE — Telephone Encounter (Signed)
 Pt wife call to see if we could get them to [employer] before noon on Wednesday since he has to wear them until the end of the month. She asked for two leg bags and 2 regular bags?! Please Advise.

## 2023-09-08 ENCOUNTER — Ambulatory Visit: Payer: Self-pay

## 2023-09-08 MED ORDER — SULFAMETHOXAZOLE-TRIMETHOPRIM 800-160 MG PO TABS: 1.0000 | ORAL_TABLET | Freq: Two times a day (BID) | ORAL | 0 refills | Status: DC

## 2023-09-08 NOTE — Telephone Encounter (Signed)
 Returned wifes call, advised her bags would be upfront for pick up at her convenience, she expressed understanding.

## 2023-09-08 NOTE — Telephone Encounter (Signed)
 Spoke with pt. Pt. Advised of results and verbalized understanding. Will send in Rx to [facility] in [place] per Patient request. Patient aware to start taking on 09/15/23.

## 2023-09-09 ENCOUNTER — Ambulatory Visit: Admitting: Rehabilitative and Restorative Service Providers"

## 2023-09-09 ENCOUNTER — Encounter: Payer: Self-pay | Admitting: Rehabilitative and Restorative Service Providers"

## 2023-09-09 DIAGNOSIS — R2689 Other abnormalities of gait and mobility: Secondary | ICD-10-CM

## 2023-09-09 DIAGNOSIS — M25562 Pain in left knee: Secondary | ICD-10-CM

## 2023-09-09 DIAGNOSIS — M6281 Muscle weakness (generalized): Secondary | ICD-10-CM | POA: Diagnosis not present

## 2023-09-09 NOTE — Therapy (Signed)
 OUTPATIENT PHYSICAL THERAPY LOWER EXTREMITY TREATMENT  Patient Name: Brandon Riley MRN: [id number] DOB:[birth year], 63 y.o., male Today's Date: 09/09/2023  END OF SESSION:  PT End of Session - 09/09/23 1023     Visit Number 12    Number of Visits 16    Date for PT Re-Evaluation 09/20/23    Authorization Type aetna    PT Start Time 1016    PT Stop Time 1058    PT Time Calculation (min) 42 min    Activity Tolerance Patient tolerated treatment well    Behavior During Therapy WFL for tasks assessed/performed              Past Medical History:  Diagnosis Date   Arthritis    GERD (gastroesophageal reflux disease)    Hyperlipemia    Hypertension    Restless leg syndrome    Sleep apnea    w cpap   Past Surgical History:  Procedure Laterality Date   CHONDROPLASTY Left 10/28/2018   Procedure: CHONDROPLASTY;  Surgeon: Micheline Ahr, MD;  Location: [facility];  Service: Orthopedics;  Laterality: Left;   KNEE ARTHROSCOPY WITH MEDIAL MENISECTOMY Left 10/28/2018   Procedure: LEFT KNEE ARTHROSCOPY WITH MEDIAL MENISECTOMY;  Surgeon: Micheline Ahr, MD;  Location: [facility];  Service: Orthopedics;  Laterality: Left;   RECONSTRUCTION OF NOSE  2019   TOTAL KNEE ARTHROPLASTY Left 07/20/2023   Procedure: ARTHROPLASTY, KNEE, TOTAL;  Surgeon: Murleen Arms, MD;  Location: [facility];  Service: Orthopedics;  Laterality: Left;   WISDOM TOOTH EXTRACTION  2012   Patient Active Problem List   Diagnosis Date Noted   Malignant neoplasm of spine (HCC) 08/31/2023   Sacral lesion 08/04/2023   Urinary retention 08/03/2023   History of total knee arthroplasty, left 07/20/2023   Preoperative clearance 05/12/2023   COVID-19 03/30/2023   Restless leg syndrome 10/08/2022   Dermatitis 09/01/2022   Prediabetes 07/09/2021   Elevated PSA 09/07/2020   Polyarthralgia 08/31/2020   Steatohepatitis 05/16/2020   Open fracture of left thumb 01/31/2020   GERD (gastroesophageal  reflux disease) 11/07/2016   Primary osteoarthritis of right wrist 09/29/2016   Mood disorder (HCC) 12/17/2015   Left insertional Achilles tendinosis 11/05/2015   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   Obstructive sleep apnea on BiPAP with periodic limb movement disorder 06/05/2014   Obesity 05/02/2013   Annual physical exam 04/04/2013   Primary osteoarthritis of both knees with pseudogout 04/04/2013   Degenerative disc disease, cervical 04/04/2013   Hyperlipidemia 04/04/2013   Benign essential hypertension 04/04/2013   Lumbar degenerative disc disease 04/04/2013    PCP: Annemarie Kil MD REFERRING PROVIDER: Priscille Brought, MD REFERRING DIAG:  Diagnosis  [phone] (ICD-10-CM) - S/P total knee replacement   THERAPY DIAG:  Acute pain of left knee  Other abnormalities of gait and mobility  Muscle weakness (generalized)  Rationale for Evaluation and Treatment: Rehabilitation  ONSET DATE: 07/20/23 surgical date  SUBJECTIVE:  SUBJECTIVE STATEMENT: The patient feels continued stiffness in the L knee. He is doing HEP.   EVAL:The patient reports "both knees are shot" having meniscus repair in the past. He expects pain and is ready to start rehab. He had 1.5 years of pain prior to surgery. His surgery was on 07/20/23 for L TKR and post surgical course was complicated by urinary retention-- he is using a foley cath at this time and has appt with urology next week. He is using ice for pain control and meds.   PERTINENT  HISTORY: Covid, RLS, OSA on bipap, HTN, DDD PAIN:  Are you having pain? Yes: NPRS scale:  1/10 Pain location: Lt knee Pain description: ache Aggravating factors: movement, swelling Relieving factors: pain meds, ice  PRECAUTIONS: None  WEIGHT BEARING RESTRICTIONS: WBAT  FALLS:  Has patient fallen in last 6 months? Yes, 3--felt balance was off (works on this at [employer])  LIVING ENVIRONMENT: Lives with: lives with their spouse Lives in:  House/apartment Stairs: Yes: Internal: 17, basement steps; one rail and 2 walls and External: 2 steps; none Has following equipment at home: Walker - 2 wheeled  PATIENT GOALS: Good quality of life-- be able to do 7/8 of what I used to do.  NEXT MD VISIT: 08/04/23  OBJECTIVE:  Note: Objective measures were completed at Evaluation unless otherwise noted. PATIENT SURVEYS:  LEFS 10% COGNITION: Overall cognitive status: Within functional limits for tasks assessed   EDEMA:  Circumferential: 37 on R and 44 on L PALPATION: Edema L knee with dressing intact LOWER EXTREMITY ROM: Active ROM Right eval Left eval Left 07/27/23 Left 08/03/23 08/13/23 Left  08/17/23 Left 08/20/23 Left  08/24/23 Left 08/26/23  Hip flexion           Hip extension           Hip abduction           Hip adduction           Hip internal rotation           Hip external rotation           Knee flexion  AROM 75 deg heel slide AAROM 94 deg AAROM 102 deg supine  Sitting AAROM 103 deg 90 AROM 104 AROM heel slide 105 110   Knee extension  -9 PROM -12 AROM  -6 deg supine   Lacking 4 in supine  2 degrees (from full extension) prone hang  Ankle dorsiflexion           Ankle plantarflexion           Ankle inversion           Ankle eversion            (Blank rows = not tested) LOWER EXTREMITY MMT: MMT Right eval Left eval Left 08/17/23  Hip flexion     Hip extension     Hip abduction     Hip adduction     Hip internal rotation     Hip external rotation     Knee flexion     Knee extension  LAQ to -38 Extensor Lag -20  Ankle dorsiflexion     Ankle plantarflexion     Ankle inversion     Ankle eversion      (Blank rows = not tested) FUNCTIONAL TESTS:  Gait speed = 1.23 ft/sec GAIT: Distance walked: 100 ft Assistive device utilized: Environmental consultant - 2 wheeled Level of assistance: Modified independence Comments: stairs with RW since no rails at home with step to pattern and SBA for safety   [facility] Adult PT Treatment:                                                 DATE: 09/09/23 Therapeutic Exercise: Standing End range stretch on steps into flexion HS stretch with foot on 2nd step Foam standing with lateral slides Foam standing L LE  Passive ROM to 106 degrees of flexion today Anterior step ups x 6" x 10 reps Lateral step ups x 6" x 5 reps Supine Hamstring bridge sets Physioball press Physioball bridge + HS curl Prone Extension stretch Manual Therapy: PROM flexion and extension  STM/IASTM hamstring (medial) Gait: Resisted gait x 10# with pulleys x 10 reps and then eccentric control with backward walking Gait in hallway emphasizing normalization of gait   [facility] Adult PT Treatment:                                                DATE: 09/03/23 Therapeutic Exercise: Revised and updated HEP, discontinuing knee flexion ROM and strengthening to reduce stress about wound.   Neuromuscular re-ed: Standing hip abduction 2 x 10  Standing hip extension 2 x 10  Tandem 2 x 30 sec SLS x 30 sec   Self Care: Discussed avoiding repetitive flexion activity of the knee to allow for wound healing Signs/symptoms of infection No submerging the LLE Recommended to take daily pictures of wound/rash to determine overall healing  Continue with ice for pain/swelling   [facility] Adult PT Treatment:                                                DATE: 08/26/23 Therapeutic Exercise: Bike doing 12 full revolutions x 3 minute warm up Prone Knee flexion with passive overpressure Knee hang  Supine Extension moment with ankle supported on towel roll during STM Sitting Leg press 190# x 10 then 215# x 10 reps Sit<>stand with focus on L knee staying posterior to R knee Standing Lateral step ups to 4" step Anterior step up to 6" step x 10 reps adding a band for resistance at hips Marching x 30 feet x 3 reps Backwards walking x 30 feet x 3 reps Manual Therapy: STM medial HS, patellar mobilization II-III and scar tissue  massage Gait: Treadmill x 5 minutes up to 2.0 mph and p to 4% incline  Into hallway after treadmill with more normalized gait pattern x 200 ft                             PATIENT EDUCATION:  Education details: HEP update; see treatment Person educated: Patient,  Education method: Explanation, demo, handout Education comprehension: verbalized understanding, returned demo   HOME EXERCISE PROGRAM: Access Code: [email or web address] URL: [email or web address] Date: 09/03/2023 Prepared by: Forrestine Ike  Exercises - Seated Long Arc Quad  - 1 x daily - 7 x weekly - 2 sets - 10 reps - Supine Straight Leg Raises  - 1 x daily - 7 x weekly - 2 sets - 10 reps - Heel Raises with Counter Support  - 1 x daily - 7 x weekly - 2 sets - 12 reps - Standing Terminal Knee Extension with Resistance  - 1 x daily - 7 x weekly - 2 sets - 10 reps - Prone Knee Extension with Ankle Weight  - 1 x daily - 7 x weekly - 1 sets - max hold - Backwards Walking  - 1 x daily - 7 x weekly - 3 sets - 10 reps - Standing Hip Abduction with Counter Support  -  1 x daily - 7 x weekly - 2 sets - 10 reps - Standing Hip Extension with Counter Support  - 1 x daily - 7 x weekly - 2 sets - 10 reps - Tandem Stance  - 1 x daily - 7 x weekly - 3 sets - 30 sec  hold - Single Leg Stance  - 1 x daily - 7 x weekly - 3 sets - 30 sec  hold  ASSESSMENT:  CLINICAL IMPRESSION: The patient tolerated exercise progression well today. He continues with "stiffness" and has soreness in hamstring musculature with palpation. PT is progressing to patient tolerance and monitoring his incision due to recent issues with healing. Plan to progress as tolerated.  EVAL: Patient is a 63 y.o. male who was seen today for physical therapy evaluation and treatment for L TKR on 07/20/23. He presents today with impairments in ROM, strength, edema, pain, and functional limitations s/p surgery. Patient's hospital course complicated by urinary retention. He is f/u in  [facility] with urology. PT to progress patient to tolerance with goal of returning to prior functional status with reduced pain.    OBJECTIVE IMPAIRMENTS: Abnormal gait, decreased activity tolerance, decreased balance, decreased endurance, decreased ROM, decreased strength, hypomobility, increased edema, increased fascial restrictions, impaired flexibility, and pain.   GOALS: Goals reviewed with patient? Yes  SHORT TERM GOALS: Target date: 08/21/23  The patient will be indep with initial HEP Baseline: initiated at eval Goal status: MET  2.  The patient will improve LEFS up to 30% to demonstrate improved functional abilities. Baseline:  10% Goal status: DEFERRED  3.   The patient will report pain with walking < or equal to 3/10.  Baseline:  6-7/10 Goal status:MET- 4/28- patient notes "sore" after gait  4.  The patient will improve AROM L knee to 6 to 100 degrees. Baseline:  12 to 75 Goal status: MET  LONG TERM GOALS: Target date: 09/20/23  The patient will be indep with progression of HEP. Baseline:  initiated at eval Goal status: INITIAL  2.  The patient will improve LEFS up to 50% to demonstrate improved functional abilities. Baseline: 10% Goal status: INITIAL  3.  The patient will improve AROM L knee to 110 degrees flexion. Baseline:  75 degrees Goal status: INITIAL  4.  The patient will improve AROM L knee to -3 degrees full extension. Baseline:  -12 AROM Goal status: INITIAL  5.  The patient will negotiate steps mod indep with reciprocal pattern x 4 steps with one rail. Baseline:  step to pattern with walker and SBA. Goal status: INITIAL  6.  The patient will improve gait speed to > or equal to 2.5 ft/sec.  Baseline: 1.23 ft/sec Goal status: INITIAL  PLAN:  PT FREQUENCY: 2x/week  PT DURATION: 8 weeks  PLANNED INTERVENTIONS: 97164- PT Re-evaluation, 97110-Therapeutic exercises, 97530- Therapeutic activity, 97112- Neuromuscular re-education, 97535- Self Care, 04540-  Manual therapy, [phone]- Gait training, [phone]- Electrical stimulation (unattended), 97016- Vasopneumatic device, 97033- Ionotophoresis 4mg /ml Dexamethasone , Patient/Family education, Balance training, Stair training, Taping, Dry Needling, Joint mobilization, DME instructions, and Cryotherapy  PLAN FOR NEXT SESSION:  progress therex to tolerance (avoid repetitive flexion at this time) Gait training working on normalizing gait mechanics. Balance activity. *continue with resisted walking for hip initiation   Chyla Schlender, PT 09/09/23 3:58 PM

## 2023-09-15 ENCOUNTER — Ambulatory Visit: Payer: Self-pay | Admitting: Sports Medicine

## 2023-09-15 ENCOUNTER — Encounter: Payer: Self-pay | Admitting: Sports Medicine

## 2023-09-15 ENCOUNTER — Ambulatory Visit (INDEPENDENT_AMBULATORY_CARE_PROVIDER_SITE_OTHER)

## 2023-09-15 ENCOUNTER — Ambulatory Visit

## 2023-09-15 DIAGNOSIS — C412 Malignant neoplasm of vertebral column: Secondary | ICD-10-CM | POA: Diagnosis not present

## 2023-09-15 DIAGNOSIS — M25562 Pain in left knee: Secondary | ICD-10-CM

## 2023-09-15 DIAGNOSIS — K760 Fatty (change of) liver, not elsewhere classified: Secondary | ICD-10-CM | POA: Diagnosis not present

## 2023-09-15 DIAGNOSIS — M6281 Muscle weakness (generalized): Secondary | ICD-10-CM | POA: Diagnosis not present

## 2023-09-15 DIAGNOSIS — R918 Other nonspecific abnormal finding of lung field: Secondary | ICD-10-CM | POA: Diagnosis not present

## 2023-09-15 DIAGNOSIS — R2689 Other abnormalities of gait and mobility: Secondary | ICD-10-CM

## 2023-09-15 DIAGNOSIS — N3289 Other specified disorders of bladder: Secondary | ICD-10-CM | POA: Diagnosis not present

## 2023-09-15 DIAGNOSIS — N281 Cyst of kidney, acquired: Secondary | ICD-10-CM | POA: Diagnosis not present

## 2023-09-15 MED ORDER — IOHEXOL 300 MG/ML  SOLN
200.0000 mL | Freq: Once | INTRAMUSCULAR | Status: AC | PRN
  Administered 2023-09-15: 125 mL via INTRAVENOUS

## 2023-09-15 NOTE — Telephone Encounter (Signed)
 Thank you, Brandon Riley would you please look into the interventional radiology eval as well, the most important part of this workup is to get a biopsy of the tissue.

## 2023-09-15 NOTE — Telephone Encounter (Signed)
 Called urology office and asked for a return call regarding the upcoming surgery and now possible prostate cancer due to CT result.

## 2023-09-15 NOTE — Therapy (Signed)
 OUTPATIENT PHYSICAL THERAPY LOWER EXTREMITY TREATMENT  Patient Name: Brandon Riley MRN: [id number] DOB:March 25, 1961, 63 y.o., male Today's Date: 09/15/2023  END OF SESSION:  PT End of Session - 09/15/23 1532     Visit Number 13    Number of Visits 16    Date for PT Re-Evaluation 09/20/23    Authorization Type aetna    PT Start Time 1532    PT Stop Time 1611    PT Time Calculation (min) 39 min    Activity Tolerance Patient tolerated treatment well    Behavior During Therapy WFL for tasks assessed/performed               Past Medical History:  Diagnosis Date   Arthritis    GERD (gastroesophageal reflux disease)    Hyperlipemia    Hypertension    Restless leg syndrome    Sleep apnea    w cpap   Past Surgical History:  Procedure Laterality Date   CHONDROPLASTY Left 10/28/2018   Procedure: CHONDROPLASTY;  Surgeon: Micheline Ahr, MD;  Location: [facility];  Service: Orthopedics;  Laterality: Left;   KNEE ARTHROSCOPY WITH MEDIAL MENISECTOMY Left 10/28/2018   Procedure: LEFT KNEE ARTHROSCOPY WITH MEDIAL MENISECTOMY;  Surgeon: Micheline Ahr, MD;  Location: [facility];  Service: Orthopedics;  Laterality: Left;   RECONSTRUCTION OF NOSE  2019   TOTAL KNEE ARTHROPLASTY Left 07/20/2023   Procedure: ARTHROPLASTY, KNEE, TOTAL;  Surgeon: Murleen Arms, MD;  Location: [facility];  Service: Orthopedics;  Laterality: Left;   WISDOM TOOTH EXTRACTION  2012   Patient Active Problem List   Diagnosis Date Noted   Malignant neoplasm of spine (HCC) 08/31/2023   Sacral lesion 08/04/2023   Urinary retention 08/03/2023   History of total knee arthroplasty, left 07/20/2023   Preoperative clearance 05/12/2023   COVID-19 03/30/2023   Restless leg syndrome 10/08/2022   Dermatitis 09/01/2022   Prediabetes 07/09/2021   Elevated PSA 09/07/2020   Polyarthralgia 08/31/2020   Steatohepatitis 05/16/2020   Open fracture of left thumb 01/31/2020   GERD (gastroesophageal  reflux disease) 11/07/2016   Primary osteoarthritis of right wrist 09/29/2016   Mood disorder (HCC) 12/17/2015   Left insertional Achilles tendinosis 11/05/2015   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   Obstructive sleep apnea on BiPAP with periodic limb movement disorder 06/05/2014   Obesity 05/02/2013   Annual physical exam 04/04/2013   Primary osteoarthritis of both knees with pseudogout 04/04/2013   Degenerative disc disease, cervical 04/04/2013   Hyperlipidemia 04/04/2013   Benign essential hypertension 04/04/2013   Lumbar degenerative disc disease 04/04/2013    PCP: Annemarie Kil MD REFERRING PROVIDER: Priscille Brought, MD REFERRING DIAG:  Diagnosis  [phone] (ICD-10-CM) - S/P total knee replacement   THERAPY DIAG:  Acute pain of left knee  Other abnormalities of gait and mobility  Muscle weakness (generalized)  Rationale for Evaluation and Treatment: Rehabilitation  ONSET DATE: 07/20/23 surgical date  SUBJECTIVE:  SUBJECTIVE STATEMENT: Minimal knee pain upon arrival. Prostate procedure is potentially on hold due to CT scan findings. Stairs are still difficult.   EVAL:The patient reports "both knees are shot" having meniscus repair in the past. He expects pain and is ready to start rehab. He had 1.5 years of pain prior to surgery. His surgery was on 07/20/23 for L TKR and post surgical course was complicated by urinary retention-- he is using a foley cath at this time and has appt with urology next week. He is using ice  for pain control and meds.   PERTINENT HISTORY: Covid, RLS, OSA on bipap, HTN, DDD PAIN:  Are you having pain? Yes: NPRS scale:  0.5/10 Pain location: Lt knee Pain description: ache Aggravating factors: movement, swelling Relieving factors: pain meds, ice  PRECAUTIONS: None  WEIGHT BEARING RESTRICTIONS: WBAT  FALLS:  Has patient fallen in last 6 months? Yes, 3--felt balance was off (works on this at [employer])  LIVING  ENVIRONMENT: Lives with: lives with their spouse Lives in: House/apartment Stairs: Yes: Internal: 17, basement steps; one rail and 2 walls and External: 2 steps; none Has following equipment at home: Walker - 2 wheeled  PATIENT GOALS: Good quality of life-- be able to do 7/8 of what I used to do.  NEXT MD VISIT: 08/04/23  OBJECTIVE:  Note: Objective measures were completed at Evaluation unless otherwise noted. PATIENT SURVEYS:  LEFS 10% COGNITION: Overall cognitive status: Within functional limits for tasks assessed   EDEMA:  Circumferential: 37 on R and 44 on L PALPATION: Edema L knee with dressing intact LOWER EXTREMITY ROM: Active ROM Right eval Left eval Left 07/27/23 Left 08/03/23 08/13/23 Left  08/17/23 Left 08/20/23 Left  08/24/23 Left 08/26/23  Hip flexion           Hip extension           Hip abduction           Hip adduction           Hip internal rotation           Hip external rotation           Knee flexion  AROM 75 deg heel slide AAROM 94 deg AAROM 102 deg supine  Sitting AAROM 103 deg 90 AROM 104 AROM heel slide 105 110   Knee extension  -9 PROM -12 AROM  -6 deg supine   Lacking 4 in supine  2 degrees (from full extension) prone hang  Ankle dorsiflexion           Ankle plantarflexion           Ankle inversion           Ankle eversion            (Blank rows = not tested) LOWER EXTREMITY MMT: MMT Right eval Left eval Left 08/17/23  Hip flexion     Hip extension     Hip abduction     Hip adduction     Hip internal rotation     Hip external rotation     Knee flexion     Knee extension  LAQ to -38 Extensor Lag -20  Ankle dorsiflexion     Ankle plantarflexion     Ankle inversion     Ankle eversion      (Blank rows = not tested) FUNCTIONAL TESTS:  Gait speed = 1.23 ft/sec GAIT: Distance walked: 100 ft Assistive device utilized: Environmental consultant - 2 wheeled Level of assistance: Modified independence Comments: stairs with RW since no rails at home with step to  pattern and SBA for safety  [facility] Adult PT Treatment:                                                DATE: 09/15/23 Therapeutic Exercise: Treadmill speed 1.5 x 5 minutes   Therapeutic Activity: Stair training in [facility], focusing on reducing  circumduction on LLE  Eccentric leg press 1 x 10 @ 115, 2 x 10 @ 140 lbs  Step ups x 10 6 inch, x 10 8 inch    [facility] Adult PT Treatment:                                                DATE: 09/09/23 Therapeutic Exercise: Standing End range stretch on steps into flexion HS stretch with foot on 2nd step Foam standing with lateral slides Foam standing L LE  Passive ROM to 106 degrees of flexion today Anterior step ups x 6" x 10 reps Lateral step ups x 6" x 5 reps Supine Hamstring bridge sets Physioball press Physioball bridge + HS curl Prone Extension stretch Manual Therapy: PROM flexion and extension  STM/IASTM hamstring (medial) Gait: Resisted gait x 10# with pulleys x 10 reps and then eccentric control with backward walking Gait in hallway emphasizing normalization of gait   [facility] Adult PT Treatment:                                                DATE: 09/03/23 Therapeutic Exercise: Revised and updated HEP, discontinuing knee flexion ROM and strengthening to reduce stress about wound.   Neuromuscular re-ed: Standing hip abduction 2 x 10  Standing hip extension 2 x 10  Tandem 2 x 30 sec SLS x 30 sec   Self Care: Discussed avoiding repetitive flexion activity of the knee to allow for wound healing Signs/symptoms of infection No submerging the LLE Recommended to take daily pictures of wound/rash to determine overall healing  Continue with ice for pain/swelling   [facility] Adult PT Treatment:                                                DATE: 08/26/23 Therapeutic Exercise: Bike doing 12 full revolutions x 3 minute warm up Prone Knee flexion with passive overpressure Knee hang  Supine Extension moment with ankle supported on towel  roll during STM Sitting Leg press 190# x 10 then 215# x 10 reps Sit<>stand with focus on L knee staying posterior to R knee Standing Lateral step ups to 4" step Anterior step up to 6" step x 10 reps adding a band for resistance at hips Marching x 30 feet x 3 reps Backwards walking x 30 feet x 3 reps Manual Therapy: STM medial HS, patellar mobilization II-III and scar tissue massage Gait: Treadmill x 5 minutes up to 2.0 mph and p to 4% incline  Into hallway after treadmill with more normalized gait pattern x 200 ft                             PATIENT EDUCATION:  Education details: HEP review  Person educated: Patient,  Education method: Explanation,  Education comprehension: verbalized understanding,  HOME EXERCISE PROGRAM: Access Code: [email or web address] URL: [email or web address] Date: 09/03/2023 Prepared by: Forrestine Ike  Exercises - Seated Long Arc Quad  - 1 x daily - 7 x weekly - 2 sets - 10 reps - Supine Straight Leg  Raises  - 1 x daily - 7 x weekly - 2 sets - 10 reps - Heel Raises with Counter Support  - 1 x daily - 7 x weekly - 2 sets - 12 reps - Standing Terminal Knee Extension with Resistance  - 1 x daily - 7 x weekly - 2 sets - 10 reps - Prone Knee Extension with Ankle Weight  - 1 x daily - 7 x weekly - 1 sets - max hold - Backwards Walking  - 1 x daily - 7 x weekly - 3 sets - 10 reps - Standing Hip Abduction with Counter Support  - 1 x daily - 7 x weekly - 2 sets - 10 reps - Standing Hip Extension with Counter Support  - 1 x daily - 7 x weekly - 2 sets - 10 reps - Tandem Stance  - 1 x daily - 7 x weekly - 3 sets - 30 sec  hold - Single Leg Stance  - 1 x daily - 7 x weekly - 3 sets - 30 sec  hold  ASSESSMENT:  CLINICAL IMPRESSION: Patient will minimal Lt knee pain upon arrival. Worked on stair negotiation as patient reports this is a difficult task. He has difficulty controlling lowering on the LLE requiring use of handrail support for stair descent.  Circumduction swing noted on LLE during stair ascent. With continued practice he is able to correct circumduction swing during ascent with stair negotiation at conclusion of session.   EVAL: Patient is a 63 y.o. male who was seen today for physical therapy evaluation and treatment for L TKR on 07/20/23. He presents today with impairments in ROM, strength, edema, pain, and functional limitations s/p surgery. Patient's hospital course complicated by urinary retention. He is f/u in [facility] with urology. PT to progress patient to tolerance with goal of returning to prior functional status with reduced pain.    OBJECTIVE IMPAIRMENTS: Abnormal gait, decreased activity tolerance, decreased balance, decreased endurance, decreased ROM, decreased strength, hypomobility, increased edema, increased fascial restrictions, impaired flexibility, and pain.   GOALS: Goals reviewed with patient? Yes  SHORT TERM GOALS: Target date: 08/21/23  The patient will be indep with initial HEP Baseline: initiated at eval Goal status: MET  2.  The patient will improve LEFS up to 30% to demonstrate improved functional abilities. Baseline:  10% Goal status: DEFERRED  3.   The patient will report pain with walking < or equal to 3/10.  Baseline:  6-7/10 Goal status:MET- 4/28- patient notes "sore" after gait  4.  The patient will improve AROM L knee to 6 to 100 degrees. Baseline:  12 to 75 Goal status: MET  LONG TERM GOALS: Target date: 09/20/23  The patient will be indep with progression of HEP. Baseline:  initiated at eval Goal status: INITIAL  2.  The patient will improve LEFS up to 50% to demonstrate improved functional abilities. Baseline: 10% Goal status: INITIAL  3.  The patient will improve AROM L knee to 110 degrees flexion. Baseline:  75 degrees Goal status: INITIAL  4.  The patient will improve AROM L knee to -3 degrees full extension. Baseline:  -12 AROM Goal status: INITIAL  5.  The patient will  negotiate steps mod indep with reciprocal pattern x 4 steps with one rail. Baseline:  step to pattern with walker and SBA. Goal status: INITIAL  6.  The patient will improve gait speed to > or equal to 2.5 ft/sec.  Baseline: 1.23 ft/sec Goal status: INITIAL  PLAN:  PT FREQUENCY: 2x/week  PT DURATION: 8 weeks  PLANNED INTERVENTIONS: 97164- PT Re-evaluation, 97110-Therapeutic exercises, 97530- Therapeutic activity, 97112- Neuromuscular re-education, 97535- Self Care, 16109- Manual therapy, [phone]- Gait training, [id number]- Electrical stimulation (unattended), 97016- Vasopneumatic device, 97033- Ionotophoresis 4mg /ml Dexamethasone , Patient/Family education, Balance training, Stair training, Taping, Dry Needling, Joint mobilization, DME instructions, and Cryotherapy  PLAN FOR NEXT SESSION:  progress therex to tolerance (avoid repetitive flexion at this time) Gait training working on normalizing gait mechanics. Balance activity. *continue with resisted walking for hip initiation; quad eccentrics.   Andjela Wickes, PT, DPT, ATC 09/15/23 4:12 PM

## 2023-09-15 NOTE — Telephone Encounter (Signed)
 Per the referral team - PET scan was denied by insurance along with clinical notes. Anairis Grier Leber (referrals) will be checking if an appeal is available.

## 2023-09-15 NOTE — Telephone Encounter (Signed)
 Please look into approval for this guy's PET scan and interventional radiology referral for biopsy.  Please also call [facility] and make sure his urologist is aware of the potential prostate cancer diagnosis, as they may want to cancel/change the upcoming prostate procedure.

## 2023-09-15 NOTE — Telephone Encounter (Signed)
 PET has been denied after sending notes, I will call and see if a appeal is available.

## 2023-09-15 NOTE — Telephone Encounter (Signed)
 Routing to the referral team for an update on the PET Scan auth and interventional radiology referral.

## 2023-09-16 ENCOUNTER — Telehealth: Payer: Self-pay | Admitting: Sports Medicine

## 2023-09-16 NOTE — Telephone Encounter (Signed)
 Dr. Daphne Eagles with [facility] called in to do a Peer to Peer for this patient. She gave the reschedule number [phone]; option 1. Case #: [id number]

## 2023-09-17 NOTE — Telephone Encounter (Signed)
 Please call [facility], [address] or the [facility] location to see what is happening with the biopsy.

## 2023-09-17 NOTE — Telephone Encounter (Signed)
 Peer to peer done for PET, approval# [id number] 08/31/23-03/15/2024.

## 2023-09-18 ENCOUNTER — Encounter: Payer: Self-pay | Admitting: Rehabilitative and Restorative Service Providers"

## 2023-09-18 ENCOUNTER — Ambulatory Visit: Admitting: Rehabilitative and Restorative Service Providers"

## 2023-09-18 ENCOUNTER — Encounter: Payer: Self-pay | Admitting: Urology

## 2023-09-18 ENCOUNTER — Other Ambulatory Visit: Payer: Self-pay

## 2023-09-18 ENCOUNTER — Encounter
Admission: RE | Admit: 2023-09-18 | Discharge: 2023-09-18 | Disposition: A | Discharge status: Home / Self Care | Source: Ambulatory Visit | Attending: Urology | Admitting: Urology

## 2023-09-18 DIAGNOSIS — R2689 Other abnormalities of gait and mobility: Secondary | ICD-10-CM

## 2023-09-18 DIAGNOSIS — M25562 Pain in left knee: Secondary | ICD-10-CM

## 2023-09-18 DIAGNOSIS — M6281 Muscle weakness (generalized): Secondary | ICD-10-CM

## 2023-09-18 HISTORY — DX: Other complications of anesthesia, initial encounter: T88.59XA

## 2023-09-18 NOTE — Therapy (Signed)
 OUTPATIENT PHYSICAL THERAPY LOWER EXTREMITY TREATMENT  Patient Name: Brandon Riley MRN: [id number] DOB:[birth year], 63 y.o., male Today's Date: 09/18/2023  END OF SESSION:  PT End of Session - 09/18/23 0845     Visit Number 14    Number of Visits 16    Date for PT Re-Evaluation 09/20/23    Authorization Type aetna    PT Start Time 0845    PT Stop Time 0928    PT Time Calculation (min) 43 min    Activity Tolerance Patient tolerated treatment well    Behavior During Therapy [facility] for tasks assessed/performed            Past Medical History:  Diagnosis Date   Arthritis    BPH (benign prostatic hyperplasia)    Complication of anesthesia    epidural did not work ended up with general and now trouble w/ urinatiing needing laser enucleation of prastate   GERD (gastroesophageal reflux disease)    Hyperlipemia    Hypertension    OSA on CPAP    Restless leg syndrome    Past Surgical History:  Procedure Laterality Date   CHONDROPLASTY Left 10/28/2018   Procedure: CHONDROPLASTY;  Surgeon: Micheline Ahr, MD;  Location: [facility];  Service: Orthopedics;  Laterality: Left;   KNEE ARTHROSCOPY WITH MEDIAL MENISECTOMY Left 10/28/2018   Procedure: LEFT KNEE ARTHROSCOPY WITH MEDIAL MENISECTOMY;  Surgeon: Micheline Ahr, MD;  Location:  SURGERY CENTER;  Service: Orthopedics;  Laterality: Left;   RECONSTRUCTION OF NOSE  2019   TOTAL KNEE ARTHROPLASTY Left 07/20/2023   Procedure: ARTHROPLASTY, KNEE, TOTAL;  Surgeon: Murleen Arms, MD;  Location: [facility];  Service: Orthopedics;  Laterality: Left;   WISDOM TOOTH EXTRACTION  2012   Patient Active Problem List   Diagnosis Date Noted   Malignant neoplasm of spine (HCC) 08/31/2023   Sacral lesion 08/04/2023   Urinary retention 08/03/2023   History of total knee arthroplasty, left 07/20/2023   Preoperative clearance 05/12/2023   COVID-19 03/30/2023   Restless leg syndrome 10/08/2022   Dermatitis 09/01/2022    Prediabetes 07/09/2021   Elevated PSA 09/07/2020   Polyarthralgia 08/31/2020   Steatohepatitis 05/16/2020   Open fracture of left thumb 01/31/2020   GERD (gastroesophageal reflux disease) 11/07/2016   Primary osteoarthritis of right wrist 09/29/2016   Mood disorder (HCC) 12/17/2015   Left insertional Achilles tendinosis 11/05/2015   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   Obstructive sleep apnea on BiPAP with periodic limb movement disorder 06/05/2014   Obesity 05/02/2013   Annual physical exam 04/04/2013   Primary osteoarthritis of both knees with pseudogout 04/04/2013   Degenerative disc disease, cervical 04/04/2013   Hyperlipidemia 04/04/2013   Benign essential hypertension 04/04/2013   Lumbar degenerative disc disease 04/04/2013    PCP: Annemarie Kil MD REFERRING PROVIDER: Priscille Brought, MD REFERRING DIAG:  Diagnosis  [phone] (ICD-10-CM) - S/P total knee replacement   THERAPY DIAG:  Acute pain of left knee  Other abnormalities of gait and mobility  Muscle weakness (generalized)  Rationale for Evaluation and Treatment: Rehabilitation  ONSET DATE: 07/20/23 surgical date  SUBJECTIVE:  SUBJECTIVE STATEMENT: The patient did weed eating in his yard for 4 hours yesterday. He is limping upon arrival today. "Just stiff" and patient feels HS are not happy. He is awaiting next steps in diagnostics for prostate and cancer screenings. PET scan is scheduled June 6.    EVAL:The patient reports "both knees are shot" having meniscus repair in the past. He expects  pain and is ready to start rehab. He had 1.5 years of pain prior to surgery. His surgery was on 07/20/23 for L TKR and post surgical course was complicated by urinary retention-- he is using a foley cath at this time and has appt with urology next week. He is using ice for pain control and meds.   PERTINENT HISTORY: Covid, RLS, OSA on bipap, HTN, DDD PAIN:  Are you having pain? Yes: NPRS scale:  0.5/10 Pain  location: Lt knee Pain description: ache Aggravating factors: movement, swelling Relieving factors: pain meds, ice  PRECAUTIONS: None  WEIGHT BEARING RESTRICTIONS: WBAT  FALLS:  Has patient fallen in last 6 months? Yes, 3--felt balance was off (works on this at [employer])  LIVING ENVIRONMENT: Lives with: lives with their spouse Lives in: House/apartment Stairs: Yes: Internal: 17, basement steps; one rail and 2 walls and External: 2 steps; none Has following equipment at home: Walker - 2 wheeled  PATIENT GOALS: Good quality of life-- be able to do 7/8 of what I used to do.  OBJECTIVE:  Note: Objective measures were completed at Evaluation unless otherwise noted. PATIENT SURVEYS:  LEFS 10% COGNITION: Overall cognitive status: Within functional limits for tasks assessed   EDEMA:  Circumferential: 37 on R and 44 on L PALPATION: Edema L knee with dressing intact LOWER EXTREMITY ROM: Active ROM Right eval Left eval Left 07/27/23 Left 08/03/23 08/13/23 Left  08/17/23 Left 08/20/23 Left  08/24/23 Left 08/26/23 09/18/23 Left  Hip flexion            Hip extension            Hip abduction            Hip adduction            Hip internal rotation            Hip external rotation            Knee flexion  AROM 75 deg heel slide AAROM 94 deg AAROM 102 deg supine  Sitting AAROM 103 deg 90 AROM 104 AROM heel slide 105 110  111 PROM 108 AROM  Knee extension  -9 PROM -12 AROM  -6 deg supine   Lacking 4 in supine  2 degrees (from full extension) prone hang   Ankle dorsiflexion            Ankle plantarflexion            Ankle inversion            Ankle eversion             (Blank rows = not tested) LOWER EXTREMITY MMT: MMT Right eval Left eval Left 08/17/23  Hip flexion     Hip extension     Hip abduction     Hip adduction     Hip internal rotation     Hip external rotation     Knee flexion     Knee extension  LAQ to -38 Extensor Lag -20  Ankle dorsiflexion     Ankle  plantarflexion     Ankle inversion     Ankle eversion      (Blank rows = not tested) FUNCTIONAL TESTS:  Gait speed = 1.23 ft/sec GAIT: Distance walked: 100 ft Assistive device utilized: Walker - 2 wheeled Level of assistance: Modified independence Comments: stairs with RW since no rails at home with step to pattern and SBA for safety  [facility] Adult PT Treatment:  DATE: 09/18/23 Therapeutic Exercise: Prone HS isometrics at various degrees of motion  Standing Knee flexion to end range with L foot on 12" step Hip adductor stretch Supine PROM into flexion/extension with overpressure each end range Hip adductor PROM stretch Bent knee fallout Butterfly stretch Strap stretch adductors  Manual Therapy: L hip adductor STM and massage and medial HS Therapeutic Activity: Lateral step ups to 6" step x 10 reps Anterior step downs with eccentric lowering from 4" step x 10 reps Gait: Treadmill warm up x 3 minutes up to 1.8 mph and 3% incline.  Gait with cues for fast/slow speed changes Backward walking   [facility] Adult PT Treatment:                                                DATE: 09/15/23 Therapeutic Exercise: Treadmill speed 1.5 x 5 minutes   Therapeutic Activity: Stair training in [facility], focusing on reducing circumduction on LLE  Eccentric leg press 1 x 10 @ 115, 2 x 10 @ 140 lbs  Step ups x 10 6 inch, x 10 8 inch    [facility] Adult PT Treatment:                                                DATE: 09/09/23 Therapeutic Exercise: Standing End range stretch on steps into flexion HS stretch with foot on 2nd step Foam standing with lateral slides Foam standing L LE  Passive ROM to 106 degrees of flexion today Anterior step ups x 6" x 10 reps Lateral step ups x 6" x 5 reps Supine Hamstring bridge sets Physioball press Physioball bridge + HS curl Prone Extension stretch Manual Therapy: PROM flexion and extension  STM/IASTM hamstring  (medial) Gait: Resisted gait x 10# with pulleys x 10 reps and then eccentric control with backward walking Gait in hallway emphasizing normalization of gait   [facility] Adult PT Treatment:                                                DATE: 09/03/23 Therapeutic Exercise: Revised and updated HEP, discontinuing knee flexion ROM and strengthening to reduce stress about wound.   Neuromuscular re-ed: Standing hip abduction 2 x 10  Standing hip extension 2 x 10  Tandem 2 x 30 sec SLS x 30 sec   Self Care: Discussed avoiding repetitive flexion activity of the knee to allow for wound healing Signs/symptoms of infection No submerging the LLE Recommended to take daily pictures of wound/rash to determine overall healing  Continue with ice for pain/swelling                              PATIENT EDUCATION:  Education details: HEP review  Person educated: Patient,  Education method: Explanation,  Education comprehension: verbalized understanding,  HOME EXERCISE PROGRAM: Access Code: [email or web address] URL: [email or web address] Date: 09/18/2023 Prepared by: Trygve Gage  Exercises - Seated Long Arc Quad  - 1 x daily - 7 x weekly - 2 sets - 10 reps - Supine Straight Leg Raises  -  1 x daily - 7 x weekly - 2 sets - 10 reps - Heel Raises with Counter Support  - 1 x daily - 7 x weekly - 2 sets - 12 reps - Standing Terminal Knee Extension with Resistance  - 1 x daily - 7 x weekly - 2 sets - 10 reps - Prone Knee Extension with Ankle Weight  - 1 x daily - 7 x weekly - 1 sets - max hold - Backwards Walking  - 1 x daily - 7 x weekly - 3 sets - 10 reps - Standing Hip Abduction with Counter Support  - 1 x daily - 7 x weekly - 2 sets - 10 reps - Standing Hip Extension with Counter Support  - 1 x daily - 7 x weekly - 2 sets - 10 reps - Tandem Stance  - 1 x daily - 7 x weekly - 3 sets - 30 sec  hold - Single Leg Stance  - 1 x daily - 7 x weekly - 3 sets - 30 sec  hold - Hip Adductors and Hamstring  Stretch with Strap  - 1 x daily - 5 x weekly - 1 sets - 3 reps - 30 seconds hold - Standing Hamstring Stretch with Step  - 1 x daily - 5 x weekly - 1 sets - 3 reps - 30 seconds hold  ASSESSMENT:  CLINICAL IMPRESSION: The patient continues with antalgic appearing pattern when first initiating walking after sitting. PT is continuing to progress ROM and strengthening. Patient has significant adductor and medial HS tightness, so PT added 2 new stretches to HEP. Plan to continue to progress to LTGs and anticipate ability to reduce frequency.   EVAL: Patient is a 63 y.o. male who was seen today for physical therapy evaluation and treatment for L TKR on 07/20/23. He presents today with impairments in ROM, strength, edema, pain, and functional limitations s/p surgery. Patient's hospital course complicated by urinary retention. He is f/u in OP with urology. PT to progress patient to tolerance with goal of returning to prior functional status with reduced pain.    OBJECTIVE IMPAIRMENTS: Abnormal gait, decreased activity tolerance, decreased balance, decreased endurance, decreased ROM, decreased strength, hypomobility, increased edema, increased fascial restrictions, impaired flexibility, and pain.   GOALS: Goals reviewed with patient? Yes  SHORT TERM GOALS: Target date: 08/21/23  The patient will be indep with initial HEP Baseline: initiated at eval Goal status: MET  2.  The patient will improve LEFS up to 30% to demonstrate improved functional abilities. Baseline:  10% Goal status: DEFERRED  3.   The patient will report pain with walking < or equal to 3/10.  Baseline:  6-7/10 Goal status:MET- 4/28- patient notes "sore" after gait  4.  The patient will improve AROM L knee to 6 to 100 degrees. Baseline:  12 to 75 Goal status: MET  LONG TERM GOALS: Target date: 09/20/23  The patient will be indep with progression of HEP. Baseline:  initiated at eval Goal status: INITIAL  2.  The patient will  improve LEFS up to 50% to demonstrate improved functional abilities. Baseline: 10% Goal status: INITIAL  3.  The patient will improve AROM L knee to 110 degrees flexion. Baseline:  75 degrees Goal status: INITIAL  4.  The patient will improve AROM L knee to -3 degrees full extension. Baseline:  -12 AROM Goal status: INITIAL  5.  The patient will negotiate steps mod indep with reciprocal pattern x 4 steps with one rail.  Baseline:  step to pattern with walker and SBA. Goal status: INITIAL  6.  The patient will improve gait speed to > or equal to 2.5 ft/sec.  Baseline: 1.23 ft/sec Goal status: INITIAL  PLAN:  PT FREQUENCY: 2x/week  PT DURATION: 8 weeks  PLANNED INTERVENTIONS: 97164- PT Re-evaluation, 97110-Therapeutic exercises, 97530- Therapeutic activity, [id number]- Neuromuscular re-education, 97535- Self Care, 30865- Manual therapy, [phone]- Gait training, [phone]- Electrical stimulation (unattended), 97016- Vasopneumatic device, 97033- Ionotophoresis 4mg /ml Dexamethasone , Patient/Family education, Balance training, Stair training, Taping, Dry Needling, Joint mobilization, DME instructions, and Cryotherapy  PLAN FOR NEXT SESSION:  progress therex to tolerance (avoid repetitive flexion at this time) Gait training working on normalizing gait mechanics. Balance activity. *continue with resisted walking for hip initiation; quad eccentrics. CHECK GOALS AND RENEW -- consider 1x/week  Caidence Kaseman, PT 09/18/23 12:38 PM

## 2023-09-18 NOTE — Patient Instructions (Signed)
 Your procedure is scheduled on: Friday 09/25/23 To find out your arrival time, please call [phone] between 1PM - 3PM on:  Thursday 09/24/23  Report to the Registration Desk on the 1st floor of the [facility]. Free Valet parking is available.  If your arrival time is 6:00 am, do not arrive before that time as the [facility] entrance doors do not open until 6:00 am.  REMEMBER: Instructions that are not followed completely may result in serious medical risk, up to and including death; or upon the discretion of your surgeon and anesthesiologist your surgery may need to be rescheduled.  Do not eat food after midnight the night before surgery.  No gum chewing or hard candies.  You may however, drink CLEAR liquids up to 2 hours before you are scheduled to arrive for your surgery. Do not drink anything within 2 hours of your scheduled arrival time.  Clear liquids include: - water   - apple juice without pulp - gatorade (not RED colors) - black coffee or tea (Do NOT add milk or creamers to the coffee or tea) Do NOT drink anything that is not on this list.  Type 1 and Type 2 diabetics should only drink water .  In addition, your doctor has ordered for you to drink the provided:  Ensure Pre-Surgery Clear Carbohydrate Drink  Gatorade G2 Drinking this carbohydrate drink up to two hours before surgery helps to reduce insulin resistance and improve patient outcomes. Please complete drinking 2 hours before scheduled arrival time.  One week prior to surgery: Stop Anti-inflammatories (NSAIDS) such as Advil, Aleve , Ibuprofen, Motrin, Naproxen , Naprosyn  and Aspirin  based products such as Excedrin, Goody's Powder, BC Powder. You may however, continue to take Tylenol  if needed for pain up until the day of surgery.  Stop ANY OVER THE COUNTER supplements until after surgery.  Continue taking all prescribed medications with the exception of the following:  **Follow guidelines for insulin and  diabetes medications**  Follow recommendations from Cardiologist or PCP regarding stopping blood thinners.  TAKE ONLY THESE MEDICATIONS THE MORNING OF SURGERY WITH A SIP OF WATER :    Antacid (take one the night before and one on the morning of surgery - helps to prevent nausea after surgery.)  Use inhalers on the day of surgery and bring to the hospital.  Fleets enema or bowel prep as directed.  No Alcohol  for 24 hours before or after surgery.  No Smoking including e-cigarettes for 24 hours before surgery.  No chewable tobacco products for at least 6 hours before surgery.  No nicotine patches on the day of surgery.  Do not use any "recreational" drugs for at least a week (preferably 2 weeks) before your surgery.  Please be advised that the combination of cocaine and anesthesia may have negative outcomes, up to and including death. If you test positive for cocaine, your surgery will be cancelled.  On the morning of surgery brush your teeth with toothpaste and water , you may rinse your mouth with mouthwash if you wish. Do not swallow any toothpaste or mouthwash.  Use CHG Soap or wipes as directed on instruction sheet.  Do not wear lotions, powders, or perfumes.   Do not shave body hair from the neck down 48 hours before surgery.  Wear comfortable clothing (specific to your surgery type) to the hospital.  Do not wear jewelry, make-up, hairpins, clips or nail polish.  For welded (permanent) jewelry: bracelets, anklets, waist bands, etc.  Please have this removed prior to surgery.  If it is not removed, there is a chance that hospital personnel will need to cut it off on the day of surgery. Contact lenses, hearing aids and dentures may not be worn into surgery.  Bring your C-PAP to the hospital in case you may have to spend the night.   Do not bring valuables to the hospital. [facility] is not responsible for any missing/lost belongings or valuables.   Total Shoulder  Arthroplasty:  use Benzoyl Peroxide 5% Gel as directed on instruction sheet.  Notify your doctor if there is any change in your medical condition (cold, fever, infection).  If you are being discharged the day of surgery, you will not be allowed to drive home. You will need a responsible individual to drive you home and stay with you for 24 hours after surgery.   If you are taking public transportation, you will need to have a responsible individual with you.  If you are being admitted to the hospital overnight, leave your suitcase in the car. After surgery it may be brought to your room.  In case of increased patient census, it may be necessary for you, the patient, to continue your postoperative care in the Same Day Surgery department.  After surgery, you can help prevent lung complications by doing breathing exercises.  Take deep breaths and cough every 1-2 hours. Your doctor may order a device called an Incentive Spirometer to help you take deep breaths. When coughing or sneezing, hold a pillow firmly against your incision with both hands. This is called "splinting." Doing this helps protect your incision. It also decreases belly discomfort.  Surgery Visitation Policy:  Patients undergoing a surgery or procedure may have two family members or support persons with them as long as the person is not COVID-19 positive or experiencing its symptoms.   Inpatient Visitation:    Visiting hours are 7 a.m. to 8 p.m. Up to four visitors are allowed at one time in a patient room. The visitors may rotate out with other people during the day. One designated support person (adult) may remain overnight.  Due to an increase in RSV and influenza rates and associated hospitalizations, children ages 15 and under will not be able to visit patients in [facility]. Masks continue to be strongly recommended.  Please call the Pre-admissions Testing Dept. at [phone] if you have any questions  about these instructions.

## 2023-09-22 ENCOUNTER — Other Ambulatory Visit: Payer: Self-pay | Admitting: Sports Medicine

## 2023-09-22 ENCOUNTER — Telehealth (HOSPITAL_COMMUNITY): Payer: Self-pay

## 2023-09-22 NOTE — Telephone Encounter (Signed)
-----   Message from [facility] sent at 09/22/2023  4:06 PM EDT ----- Regarding: RE: Sacral lesion biopsy Approved for attempted CT guided biopsy of faintly sclerotic lesion in the right sacrum.    SEE MRI image 11 series 8 and correlate CT image 97 of series 2.    Use the drill.   HKM ----- Message ----- From: Faye Hoops Sent: 09/22/2023   3:45 PM EDT To: Ir Procedure Requests Subject: Sacral lesion biopsy                           Procedure: IR sacral lesion biopsy   Dx: Sacral lesion  Ordering: Dr. Annemarie Kil [phone]  Imaging: MRI in epic done 08/11/23  Please review.   Please see below, I did have Dr. Alvira Josephs review and he stated this would need to be done in CT and he is unable to do.   Thanks,  Merlinda Starling ----- Message ----- From: Lawrance Presume, [place] Sent: 09/22/2023   3:26 PM EDT To: Faye Hoops Subject: RE: IR Radiologist Eval & Mgmt                 Good afternoon,  I discussed this with Dr. Alvira Josephs. He will not be able to do this procedure and suggest that it is done with CT. This will have to be approved with another one of the IR docs since he does not do the biopsy with CT.  Thanks!  Marissa ----- Message ----- From: Faye Hoops Sent: 09/22/2023   8:10 AM EDT To: Lawrance Presume, PA Subject: FW: IR Radiologist Eval & Mgmt                 Marissa,   Can you please have Dev review?  Malignant appearing lesion right sacrum, IR evaluation for image guided biopsy  Thanks,  Ash ----- Message ----- From: Xayasine, Melissa Sent: 09/18/2023   4:02 PM EDT To: Denese Finn; Faye Hoops; # Subject: IR Radiologist Eval & Mgmt                     IR Radiologist Eval & Mgmt - can't tell if this was meant to be an order or needs consult first ?   Ofc called and sure - someone new

## 2023-09-23 ENCOUNTER — Ambulatory Visit: Admitting: Rehabilitative and Restorative Service Providers"

## 2023-09-23 ENCOUNTER — Encounter: Payer: Self-pay | Admitting: Rehabilitative and Restorative Service Providers"

## 2023-09-23 DIAGNOSIS — M6281 Muscle weakness (generalized): Secondary | ICD-10-CM

## 2023-09-23 DIAGNOSIS — M25562 Pain in left knee: Secondary | ICD-10-CM

## 2023-09-23 DIAGNOSIS — R2689 Other abnormalities of gait and mobility: Secondary | ICD-10-CM

## 2023-09-23 NOTE — Therapy (Signed)
 OUTPATIENT PHYSICAL THERAPY LOWER EXTREMITY TREATMENT AND RECERTIFICATION  Patient Name: Brandon Riley MRN: [id number] DOB:03-26-1961, 63 y.o., male Today's Date: 09/23/2023  END OF SESSION:  PT End of Session - 09/23/23 1111     Visit Number 15    Number of Visits 23    Date for PT Re-Evaluation 09/20/23    Authorization Type aetna    Authorization - Number of Visits 30   per insurance total for the year   PT Start Time 1107    PT Stop Time 1150    PT Time Calculation (min) 43 min    Activity Tolerance Patient tolerated treatment well    Behavior During Therapy WFL for tasks assessed/performed             Past Medical History:  Diagnosis Date   Arthritis    BPH (benign prostatic hyperplasia)    Complication of anesthesia    epidural did not work ended up with general and now trouble w/ urinatiing needing laser enucleation of prastate   GERD (gastroesophageal reflux disease)    Hyperlipemia    Hypertension    OSA on CPAP    Restless leg syndrome    Past Surgical History:  Procedure Laterality Date   CHONDROPLASTY Left 10/28/2018   Procedure: CHONDROPLASTY;  Surgeon: Micheline Ahr, MD;  Location: [facility];  Service: Orthopedics;  Laterality: Left;   KNEE ARTHROSCOPY WITH MEDIAL MENISECTOMY Left 10/28/2018   Procedure: LEFT KNEE ARTHROSCOPY WITH MEDIAL MENISECTOMY;  Surgeon: Micheline Ahr, MD;  Location: [facility];  Service: Orthopedics;  Laterality: Left;   RECONSTRUCTION OF NOSE  2019   TOTAL KNEE ARTHROPLASTY Left 07/20/2023   Procedure: ARTHROPLASTY, KNEE, TOTAL;  Surgeon: Murleen Arms, MD;  Location: [facility];  Service: Orthopedics;  Laterality: Left;   WISDOM TOOTH EXTRACTION  2012   Patient Active Problem List   Diagnosis Date Noted   Malignant neoplasm of spine (HCC) 08/31/2023   Sacral lesion 08/04/2023   Urinary retention 08/03/2023   History of total knee arthroplasty, left 07/20/2023   Preoperative clearance 05/12/2023    COVID-19 03/30/2023   Restless leg syndrome 10/08/2022   Dermatitis 09/01/2022   Prediabetes 07/09/2021   Elevated PSA 09/07/2020   Polyarthralgia 08/31/2020   Steatohepatitis 05/16/2020   Open fracture of left thumb 01/31/2020   GERD (gastroesophageal reflux disease) 11/07/2016   Primary osteoarthritis of right wrist 09/29/2016   Mood disorder (HCC) 12/17/2015   Left insertional Achilles tendinosis 11/05/2015   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   Obstructive sleep apnea on BiPAP with periodic limb movement disorder 06/05/2014   Obesity 05/02/2013   Annual physical exam 04/04/2013   Primary osteoarthritis of both knees with pseudogout 04/04/2013   Degenerative disc disease, cervical 04/04/2013   Hyperlipidemia 04/04/2013   Benign essential hypertension 04/04/2013   Lumbar degenerative disc disease 04/04/2013    PCP: Annemarie Kil MD REFERRING PROVIDER: Priscille Brought, MD REFERRING DIAG:  Diagnosis  [phone] (ICD-10-CM) - S/P total knee replacement   THERAPY DIAG:  Acute pain of left knee  Other abnormalities of gait and mobility  Muscle weakness (generalized)  Rationale for Evaluation and Treatment: Rehabilitation  ONSET DATE: 07/20/23 surgical date  SUBJECTIVE:  SUBJECTIVE STATEMENT: The patient notes he got a CT Tuesday and by Saturday developed a worsening rash around his L knee incision-- he arrived with it wrapped in ace wrap. He reports stiffness. PET scan is scheduled June 6.    EVAL:The patient reports "both  knees are shot" having meniscus repair in the past. He expects pain and is ready to start rehab. He had 1.5 years of pain prior to surgery. His surgery was on 07/20/23 for L TKR and post surgical course was complicated by urinary retention-- he is using a foley cath at this time and has appt with urology next week. He is using ice for pain control and meds.   PERTINENT HISTORY: Covid, RLS, OSA on bipap, HTN, DDD PAIN:  Are you having  pain? Yes: NPRS scale:  Increased today to 2-4/10, depending on activities Pain location: Lt knee Pain description: stiffness Aggravating factors: movement Relieving factors: pain meds, ice  PRECAUTIONS: None  WEIGHT BEARING RESTRICTIONS: WBAT  FALLS:  Has patient fallen in last 6 months? Yes, 3--felt balance was off (works on this at [employer])  LIVING ENVIRONMENT: Lives with: lives with their spouse Lives in: House/apartment Stairs: Yes: Internal: 17, basement steps; one rail and 2 walls and External: 2 steps; none Has following equipment at home: Walker - 2 wheeled  PATIENT GOALS: Good quality of life-- be able to do 7/8 of what I used to do.  NEXT MD VISIT: 10/01/23  OBJECTIVE:  Note: Objective measures were completed at Evaluation unless otherwise noted. PATIENT SURVEYS:  LEFS 10% COGNITION: Overall cognitive status: Within functional limits for tasks assessed   EDEMA:  Circumferential: 37 on R and 44 on L PALPATION: Edema L knee with dressing intact LOWER EXTREMITY ROM: Active ROM Right eval Left eval Left 07/27/23 Left 08/03/23 08/13/23 Left  08/17/23 Left 08/20/23 Left  08/24/23 Left 08/26/23 09/18/23 Left 09/23/23  Hip flexion             Hip extension             Hip abduction             Hip adduction             Hip internal rotation             Hip external rotation             Knee flexion  AROM 75 deg heel slide AAROM 94 deg AAROM 102 deg supine  Sitting AAROM 103 deg 90 AROM 104 AROM heel slide 105 110  111 PROM 108 AROM 112 degrees AAROM knee to chest  Knee extension  -9 PROM -12 AROM  -6 deg supine   Lacking 4 in supine  2 degrees (from full extension) prone hang    Ankle dorsiflexion             Ankle plantarflexion             Ankle inversion             Ankle eversion              (Blank rows = not tested) LOWER EXTREMITY MMT: MMT Right eval Left eval Left 08/17/23  Hip flexion     Hip extension     Hip abduction     Hip adduction     Hip  internal rotation     Hip external rotation     Knee flexion     Knee extension  LAQ to -38 Extensor Lag -20  Ankle dorsiflexion     Ankle plantarflexion     Ankle inversion     Ankle eversion      (Blank rows = not tested) FUNCTIONAL TESTS:  Gait speed = 1.23 ft/sec GAIT: Distance walked:  100 ft Assistive device utilized: Environmental consultant - 2 wheeled Level of assistance: Modified independence Comments: stairs with RW since no rails at home with step to pattern and SBA for safety  [facility] Adult PT Treatment:                                                DATE: 09/23/23 Therapeutic Exercise: Standing Knee flexion to end range with L foot on 12" step Step ups to 6" step with increased pain today Single leg heel raises Single leg balance dec'ing UE support L LE Loading through knee-- L leg on mat table with towel roll and performing gentle rocking into flexion (beginning of quadriped position)-- unable to tolerate weightbearing in this position Prone Knee flexion AROM with passive overpressure by PT--fatigues after 8 reps Manual Therapy: STM L hamstring in prone position, supine adductors--much improved from last session due to patient performing self massage Gait: Gait with cues for maintaining L weight shift during mid stance x 400 ft in [facility] of [facility] Gait speed=2.53 ft/sec   [facility] Adult PT Treatment:                                                DATE: 09/18/23 Therapeutic Exercise: Prone HS isometrics at various degrees of motion  Standing Knee flexion to end range with L foot on 12" step Hip adductor stretch Supine PROM into flexion/extension with overpressure each end range Hip adductor PROM stretch Bent knee fallout Butterfly stretch Strap stretch adductors  Manual Therapy: L hip adductor STM and massage and medial HS Therapeutic Activity: Lateral step ups to 6" step x 10 reps Anterior step downs with eccentric lowering from 4" step x 10 reps Gait: Treadmill warm up x  3 minutes up to 1.8 mph and 3% incline.  Gait with cues for fast/slow speed changes Backward walking   [facility] Adult PT Treatment:                                                DATE: 09/15/23 Therapeutic Exercise: Treadmill speed 1.5 x 5 minutes   Therapeutic Activity: Stair training in [facility], focusing on reducing circumduction on LLE  Eccentric leg press 1 x 10 @ 115, 2 x 10 @ 140 lbs  Step ups x 10 6 inch, x 10 8 inch                             PATIENT EDUCATION:  Education details: HEP review  Person educated: Patient,  Education method: Explanation,  Education comprehension: verbalized understanding,  HOME EXERCISE PROGRAM: Access Code: [email or web address] URL: [email or web address] Date: 09/18/2023 Prepared by: Trygve Gage  Exercises - Seated Long Arc Quad  - 1 x daily - 7 x weekly - 2 sets - 10 reps - Supine Straight Leg Raises  - 1 x daily - 7 x weekly - 2 sets - 10 reps - Heel Raises with Counter Support  - 1 x daily - 7 x weekly - 2 sets - 12 reps - Standing Terminal Knee Extension with Resistance  -  1 x daily - 7 x weekly - 2 sets - 10 reps - Prone Knee Extension with Ankle Weight  - 1 x daily - 7 x weekly - 1 sets - max hold - Backwards Walking  - 1 x daily - 7 x weekly - 3 sets - 10 reps - Standing Hip Abduction with Counter Support  - 1 x daily - 7 x weekly - 2 sets - 10 reps - Standing Hip Extension with Counter Support  - 1 x daily - 7 x weekly - 2 sets - 10 reps - Tandem Stance  - 1 x daily - 7 x weekly - 3 sets - 30 sec  hold - Single Leg Stance  - 1 x daily - 7 x weekly - 3 sets - 30 sec  hold - Hip Adductors and Hamstring Stretch with Strap  - 1 x daily - 5 x weekly - 1 sets - 3 reps - 30 seconds hold - Standing Hamstring Stretch with Step  - 1 x daily - 5 x weekly - 1 sets - 3 reps - 30 seconds hold  ASSESSMENT:  CLINICAL IMPRESSION: The patient continues with antalgic appearing gait when rising after sitting. We continue to work on normalizing  gait. Patient has many other factors that contribute to stiffness in L knee. He has increased skin irritation this week due to dye from CT scan last week. He feels the skin irritation leads to greater stiffness. PT continuing to work to normalize gait, improve gait speed, continue to improve ROM and return to gym routine.   EVAL: Patient is a 62 y.o. male who was seen today for physical therapy evaluation and treatment for L TKR on 07/20/23. He presents today with impairments in ROM, strength, edema, pain, and functional limitations s/p surgery. Patient's hospital course complicated by urinary retention. He is f/u in OP with urology. PT to progress patient to tolerance with goal of returning to prior functional status with reduced pain.    OBJECTIVE IMPAIRMENTS: Abnormal gait, decreased activity tolerance, decreased balance, decreased endurance, decreased ROM, decreased strength, hypomobility, increased edema, increased fascial restrictions, impaired flexibility, and pain.   GOALS: Goals reviewed with patient? Yes  SHORT TERM GOALS: Target date: 08/21/23  The patient will be indep with initial HEP Baseline: initiated at eval Goal status: MET  2.  The patient will improve LEFS up to 30% to demonstrate improved functional abilities. Baseline:  10% Goal status: DEFERRED  3.   The patient will report pain with walking < or equal to 3/10.  Baseline:  6-7/10 Goal status:MET- 4/28- patient notes "sore" after gait  4.  The patient will improve AROM L knee to 6 to 100 degrees. Baseline:  12 to 75 Goal status: MET  LONG TERM GOALS: Target date: 09/20/23  The patient will be indep with progression of HEP. Baseline:  initiated at eval Goal status: PARTIALLY MET ON 09/23/23  2.  The patient will improve LEFS up to 50% to demonstrate improved functional abilities. Baseline: 10% Goal status: PARTIALLY MET 42.5% (score is influenced by both the knee and the urinary issues-- he notes it limits some  movement)  3.  The patient will improve AROM L knee to 110 degrees flexion. Baseline:  75 degrees Goal status : MET  4.  The patient will improve AROM L knee to -3 degrees full extension. Baseline:  -12 AROM Goal status: MET  5.  The patient will negotiate steps mod indep with reciprocal pattern x 4 steps  with one rail. Baseline:  step to pattern with walker and SBA. Goal status: PARTIALLY MET-- ascends reciprocal pattern and descends step to pattern  6.  The patient will improve gait speed to > or equal to 2.5 ft/sec.  Baseline: 1.23 ft/sec Goal status: MET scoring 2.53 ft/sec on 09/23/23   UPDATED GOALS:  SHORT TERM GOALS: Target date: 10/23/23  The patient will be indep with HEP progression. Baseline: has HEP-- has been limited in performing this week due to other medical appts and procedures Goal status: UPDATED  2.   The patient will demonstrate reciprocal pattern with descending stairs. Baseline:  step to pattern to descend/ able to ascend with reciprocal pattern. Goal status:UPDATED  LONG TERM GOALS: Target date: 11/22/23  The patient will return to gym routine for post d/c activities. Baseline: limited exercises at gym Goal status: UPDATED  2.  The patient will improve LEFS up to 50% to demonstrate improved functional abilities. Baseline: 42.5% ON 09/23/23 Goal status: UPDATED   3.  The patient will improve AROM L knee to 115 degrees Baseline:  112 degrees Goal status : UPDATED  4.  The patient will normalize gait mechanics. Baseline: favors L LE and has antalgic gait upon rising from sitting Goal status: UPDATED   PLAN:  PT FREQUENCY: 2x/week x 4 weeks, 1x/week x 4 weeks (will reduce to 1x/week as soon as able)  PT DURATION: 8 total weeks  PLANNED INTERVENTIONS: 97164- PT Re-evaluation, 97110-Therapeutic exercises, 97530- Therapeutic activity, 97112- Neuromuscular re-education, 97535- Self Care, 08657- Manual therapy, [id number]- Gait training, [phone]-  Electrical stimulation (unattended), 97016- Vasopneumatic device, 97033- Ionotophoresis 4mg /ml Dexamethasone , Patient/Family education, Balance training, Stair training, Taping, Dry Needling, Joint mobilization, DME instructions, and Cryotherapy  PLAN FOR NEXT SESSION:  progress therex to tolerance, Gait training working on normalizing gait mechanics. Balance activity. *continue with resisted walking for hip initiation; quad eccentrics.    Elinor Kleine, PT 09/23/23 5:22 PM

## 2023-09-23 NOTE — Progress Notes (Signed)
 Brandon Riley, Ahmod Gillespie      Previous Messages    ----- Message ----- From: Roxie Cord, MD Sent: 09/23/2023   9:00 AM EDT To: Faye Hoops Subject: RE: Sacral lesion biopsy                      I think anyone can attempt this biopsy. ----- Message ----- From: Faye Hoops Sent: 09/22/2023   5:17 PM EDT To: Roxie Cord, MD Subject: RE: Sacral lesion biopsy                      Does this need to be done with any specific Rad? ----- Message ----- From: Roxie Cord, MD Sent: 09/22/2023   4:08 PM EDT To: Faye Hoops Subject: RE: Sacral lesion biopsy                      Approved for attempted CT guided biopsy of faintly sclerotic lesion in the right sacrum.    SEE MRI image 11 series 8 and correlate CT image 97 of series 2.    Use the drill.  HKM ----- Message ----- From: Faye Hoops Sent: 09/22/2023   3:45 PM EDT To: Ir Procedure Requests Subject: Sacral lesion biopsy                          Procedure: IR sacral lesion biopsy  Dx: Sacral lesion  Ordering: Dr. Annemarie Kil [phone]  Imaging: MRI in epic done 08/11/23  Please review.  Please see below, I did have Dr. Alvira Josephs review and he stated this would need to be done in CT and he is unable to do.  Thanks, Merlinda Starling ----- Message ----- From: Lawrance Presume, [place] Sent: 09/22/2023   3:26 PM EDT To: Faye Hoops Subject: RE: IR Radiologist Eval & Mgmt                Good afternoon,  I discussed this with Dr. Alvira Josephs. He will not be able to do this procedure and suggest that it is done with CT. This will have to be approved with another one of the IR docs since he does not do the biopsy with CT.  Thanks! Marissa ----- Message ----- From: Faye Hoops Sent: 09/22/2023   8:10 AM EDT To: Lawrance Presume, PA Subject: FW: IR Radiologist Eval & Mgmt                Marissa,  Can you please have Dev review?  Malignant appearing lesion right sacrum, IR  evaluation for image guided biopsy  Thanks, Ash ----- Message ----- From: Shakara Tweedy Sent: 09/18/2023   4:02 PM EDT To: Denese Finn; Faye Hoops; * Subject: IR Radiologist Eval & Mgmt                    IR Radiologist Eval & Mgmt - can't tell if this was meant to be an order or needs consult first ?   Ofc called and sure - someone new

## 2023-09-24 ENCOUNTER — Telehealth (HOSPITAL_COMMUNITY): Payer: Self-pay

## 2023-09-24 NOTE — Telephone Encounter (Signed)
-----   Message from Colman Deans [facility] sent at 09/23/2023  8:59 AM EDT ----- Regarding: RE: Sacral lesion biopsy I think anyone can attempt this biopsy. ----- Message ----- From: Faye Hoops Sent: 09/22/2023   5:17 PM EDT To: Roxie Cord, MD Subject: RE: Sacral lesion biopsy                       Does this need to be done with any specific Rad? ----- Message ----- From: Roxie Cord, MD Sent: 09/22/2023   4:08 PM EDT To: Faye Hoops Subject: RE: Sacral lesion biopsy                       Approved for attempted CT guided biopsy of faintly sclerotic lesion in the right sacrum.    SEE MRI image 11 series 8 and correlate CT image 97 of series 2.    Use the drill.   HKM ----- Message ----- From: Faye Hoops Sent: 09/22/2023   3:45 PM EDT To: Ir Procedure Requests Subject: Sacral lesion biopsy                           Procedure: IR sacral lesion biopsy   Dx: Sacral lesion  Ordering: Dr. Annemarie Kil [phone]  Imaging: MRI in epic done 08/11/23  Please review.   Please see below, I did have Dr. Alvira Josephs review and he stated this would need to be done in CT and he is unable to do.   Thanks,  Merlinda Starling ----- Message ----- From: Lawrance Presume, [place] Sent: 09/22/2023   3:26 PM EDT To: Faye Hoops Subject: RE: IR Radiologist Eval & Mgmt                 Good afternoon,  I discussed this with Dr. Alvira Josephs. He will not be able to do this procedure and suggest that it is done with CT. This will have to be approved with another one of the IR docs since he does not do the biopsy with CT.  Thanks!  Marissa ----- Message ----- From: Faye Hoops Sent: 09/22/2023   8:10 AM EDT To: Lawrance Presume, PA Subject: FW: IR Radiologist Eval & Mgmt                 Marissa,   Can you please have Dev review?  Malignant appearing lesion right sacrum, IR evaluation for image guided biopsy  Thanks,  Ash ----- Message ----- From: Xayasine,  Melissa Sent: 09/18/2023   4:02 PM EDT To: Denese Finn; Faye Hoops; # Subject: IR Radiologist Eval & Mgmt                     IR Radiologist Eval & Mgmt - can't tell if this was meant to be an order or needs consult first ?   Ofc called and sure - someone new

## 2023-09-25 ENCOUNTER — Emergency Department (HOSPITAL_BASED_OUTPATIENT_CLINIC_OR_DEPARTMENT_OTHER)
Admission: EM | Admit: 2023-09-25 | Discharge: 2023-09-26 | Disposition: A | Discharge status: Home / Self Care | Source: Home / Self Care | Attending: Emergency Medicine | Admitting: Emergency Medicine

## 2023-09-25 ENCOUNTER — Ambulatory Visit
Admission: RE | Admit: 2023-09-25 | Discharge: 2023-09-25 | Disposition: A | Discharge status: Home / Self Care | Attending: Urology | Admitting: Urology

## 2023-09-25 ENCOUNTER — Ambulatory Visit: Payer: Self-pay | Admitting: Urgent Care

## 2023-09-25 ENCOUNTER — Emergency Department (HOSPITAL_BASED_OUTPATIENT_CLINIC_OR_DEPARTMENT_OTHER)

## 2023-09-25 ENCOUNTER — Other Ambulatory Visit: Payer: Self-pay

## 2023-09-25 ENCOUNTER — Encounter
Admission: RE | Disposition: A | Discharge status: Home / Self Care | Payer: Self-pay | Source: Home / Self Care | Attending: Urology

## 2023-09-25 ENCOUNTER — Encounter: Payer: Self-pay | Admitting: Urology

## 2023-09-25 DIAGNOSIS — R338 Other retention of urine: Secondary | ICD-10-CM | POA: Insufficient documentation

## 2023-09-25 DIAGNOSIS — N400 Enlarged prostate without lower urinary tract symptoms: Secondary | ICD-10-CM | POA: Diagnosis not present

## 2023-09-25 DIAGNOSIS — I10 Essential (primary) hypertension: Secondary | ICD-10-CM | POA: Insufficient documentation

## 2023-09-25 DIAGNOSIS — N401 Enlarged prostate with lower urinary tract symptoms: Secondary | ICD-10-CM | POA: Insufficient documentation

## 2023-09-25 DIAGNOSIS — R000 Tachycardia, unspecified: Secondary | ICD-10-CM | POA: Diagnosis not present

## 2023-09-25 DIAGNOSIS — K4020 Bilateral inguinal hernia, without obstruction or gangrene, not specified as recurrent: Secondary | ICD-10-CM | POA: Diagnosis not present

## 2023-09-25 DIAGNOSIS — T83098A Other mechanical complication of other indwelling urethral catheter, initial encounter: Secondary | ICD-10-CM | POA: Insufficient documentation

## 2023-09-25 DIAGNOSIS — N1330 Unspecified hydronephrosis: Secondary | ICD-10-CM | POA: Diagnosis not present

## 2023-09-25 DIAGNOSIS — Y829 Unspecified medical devices associated with adverse incidents: Secondary | ICD-10-CM | POA: Insufficient documentation

## 2023-09-25 DIAGNOSIS — R1030 Lower abdominal pain, unspecified: Secondary | ICD-10-CM | POA: Diagnosis not present

## 2023-09-25 DIAGNOSIS — Z79899 Other long term (current) drug therapy: Secondary | ICD-10-CM | POA: Insufficient documentation

## 2023-09-25 DIAGNOSIS — N281 Cyst of kidney, acquired: Secondary | ICD-10-CM | POA: Diagnosis not present

## 2023-09-25 DIAGNOSIS — N138 Other obstructive and reflux uropathy: Secondary | ICD-10-CM

## 2023-09-25 DIAGNOSIS — G4730 Sleep apnea, unspecified: Secondary | ICD-10-CM | POA: Diagnosis not present

## 2023-09-25 DIAGNOSIS — R1084 Generalized abdominal pain: Secondary | ICD-10-CM | POA: Diagnosis not present

## 2023-09-25 DIAGNOSIS — R109 Unspecified abdominal pain: Secondary | ICD-10-CM | POA: Insufficient documentation

## 2023-09-25 DIAGNOSIS — N132 Hydronephrosis with renal and ureteral calculous obstruction: Secondary | ICD-10-CM | POA: Diagnosis not present

## 2023-09-25 DIAGNOSIS — K219 Gastro-esophageal reflux disease without esophagitis: Secondary | ICD-10-CM | POA: Diagnosis not present

## 2023-09-25 DIAGNOSIS — N134 Hydroureter: Secondary | ICD-10-CM | POA: Diagnosis not present

## 2023-09-25 DIAGNOSIS — K9189 Other postprocedural complications and disorders of digestive system: Secondary | ICD-10-CM | POA: Diagnosis not present

## 2023-09-25 DIAGNOSIS — G8918 Other acute postprocedural pain: Secondary | ICD-10-CM

## 2023-09-25 DIAGNOSIS — T839XXA Unspecified complication of genitourinary prosthetic device, implant and graft, initial encounter: Secondary | ICD-10-CM

## 2023-09-25 HISTORY — PX: HOLEP-LASER ENUCLEATION OF THE PROSTATE WITH MORCELLATION: SHX6641

## 2023-09-25 HISTORY — DX: Obstructive sleep apnea (adult) (pediatric): G47.33

## 2023-09-25 HISTORY — DX: Benign prostatic hyperplasia without lower urinary tract symptoms: N40.0

## 2023-09-25 LAB — COMPREHENSIVE METABOLIC PANEL WITH GFR
ALT: 28 U/L (ref 0–44)
AST: 25 U/L (ref 15–41)
Albumin: 3.8 g/dL (ref 3.5–5.0)
Alkaline Phosphatase: 76 U/L (ref 38–126)
Anion gap: 16 — ABNORMAL HIGH (ref 5–15)
BUN: 20 mg/dL (ref 8–23)
CO2: 15 mmol/L — ABNORMAL LOW (ref 22–32)
Calcium: 8.7 mg/dL — ABNORMAL LOW (ref 8.9–10.3)
Chloride: 99 mmol/L (ref 98–111)
Creatinine, Ser: 1.25 mg/dL — ABNORMAL HIGH (ref 0.61–1.24)
GFR, Estimated: >60 mL/min (ref >60)
Glucose, Bld: 159 mg/dL — ABNORMAL HIGH (ref 70–99)
Potassium: 4.2 mmol/L (ref 3.5–5.1)
Sodium: 130 mmol/L — ABNORMAL LOW (ref 135–145)
Total Bilirubin: 1 mg/dL (ref 0.0–1.2)
Total Protein: 6.8 g/dL (ref 6.5–8.1)

## 2023-09-25 LAB — CBC WITH DIFFERENTIAL/PLATELET
Abs Immature Granulocytes: 0.1 10*3/uL — ABNORMAL HIGH (ref 0.00–0.07)
Basophils Absolute: 0 10*3/uL (ref 0.0–0.1)
Basophils Relative: 0 %
Eosinophils Absolute: 0 10*3/uL (ref 0.0–0.5)
Eosinophils Relative: 0 %
HCT: 42.4 % (ref 39.0–52.0)
Hemoglobin: 14.8 g/dL (ref 13.0–17.0)
Immature Granulocytes: 1 %
Lymphocytes Relative: 5 %
Lymphs Abs: 0.6 10*3/uL — ABNORMAL LOW (ref 0.7–4.0)
MCH: 30 pg (ref 26.0–34.0)
MCHC: 34.9 g/dL (ref 30.0–36.0)
MCV: 86 fL (ref 80.0–100.0)
Monocytes Absolute: 1.1 10*3/uL — ABNORMAL HIGH (ref 0.1–1.0)
Monocytes Relative: 9 %
Neutro Abs: 10 10*3/uL — ABNORMAL HIGH (ref 1.7–7.7)
Neutrophils Relative %: 85 %
Platelets: 199 10*3/uL (ref 150–400)
RBC: 4.93 MIL/uL (ref 4.22–5.81)
RDW: 12.6 % (ref 11.5–15.5)
WBC: 11.7 10*3/uL — ABNORMAL HIGH (ref 4.0–10.5)
nRBC: 0 % (ref 0.0–0.2)

## 2023-09-25 SURGERY — ENUCLEATION, PROSTATE, USING LASER, WITH MORCELLATION
Anesthesia: General | Site: Prostate

## 2023-09-25 MED ORDER — CEFAZOLIN SODIUM-DEXTROSE 2-4 GM/100ML-% IV SOLN: 2.0000 g | INTRAVENOUS | Status: DC

## 2023-09-25 MED ORDER — CIPROFLOXACIN IN D5W 400 MG/200ML IV SOLN
INTRAVENOUS | Status: AC
  Filled 2023-09-25: qty 200

## 2023-09-25 MED ORDER — LIDOCAINE HCL (CARDIAC) PF 100 MG/5ML IV SOSY
PREFILLED_SYRINGE | INTRAVENOUS | Status: DC | PRN
  Administered 2023-09-25: 100 mg via INTRAVENOUS

## 2023-09-25 MED ORDER — GLYCOPYRROLATE 0.2 MG/ML IJ SOLN
INTRAMUSCULAR | Status: DC | PRN
  Administered 2023-09-25: .2 mg via INTRAVENOUS

## 2023-09-25 MED ORDER — CHLORHEXIDINE GLUCONATE 0.12 % MT SOLN
OROMUCOSAL | Status: AC
  Filled 2023-09-25: qty 15

## 2023-09-25 MED ORDER — FENTANYL CITRATE (PF) 100 MCG/2ML IJ SOLN
INTRAMUSCULAR | Status: AC
  Filled 2023-09-25: qty 2

## 2023-09-25 MED ORDER — HYDROMORPHONE HCL 1 MG/ML IJ SOLN
1.0000 mg | Freq: Once | INTRAMUSCULAR | Status: AC
  Administered 2023-09-25: 1 mg via INTRAVENOUS
  Filled 2023-09-25: qty 1

## 2023-09-25 MED ORDER — ACETAMINOPHEN 10 MG/ML IV SOLN
INTRAVENOUS | Status: DC | PRN
Start: 2023-09-25 — End: 2023-09-25
  Administered 2023-09-25: 1000 mg via INTRAVENOUS

## 2023-09-25 MED ORDER — ROCURONIUM BROMIDE 100 MG/10ML IV SOLN
INTRAVENOUS | Status: DC | PRN
  Administered 2023-09-25: 50 mg via INTRAVENOUS

## 2023-09-25 MED ORDER — OXYBUTYNIN CHLORIDE ER 10 MG PO TB24: 10.0000 mg | ORAL_TABLET | Freq: Every day | ORAL | 0 refills | Status: DC | PRN

## 2023-09-25 MED ORDER — TRAMADOL HCL 50 MG PO TABS: 25.0000 mg | ORAL_TABLET | Freq: Four times a day (QID) | ORAL | 0 refills | Status: DC | PRN

## 2023-09-25 MED ORDER — CHLORHEXIDINE GLUCONATE 0.12 % MT SOLN
15.0000 mL | Freq: Once | OROMUCOSAL | Status: AC
  Administered 2023-09-25: 15 mL via OROMUCOSAL

## 2023-09-25 MED ORDER — CIPROFLOXACIN IN D5W 400 MG/200ML IV SOLN
400.0000 mg | Freq: Once | INTRAVENOUS | Status: AC
  Administered 2023-09-25: 400 mg via INTRAVENOUS

## 2023-09-25 MED ORDER — MIDAZOLAM HCL 2 MG/2ML IJ SOLN
INTRAMUSCULAR | Status: AC
  Filled 2023-09-25: qty 2

## 2023-09-25 MED ORDER — SUCCINYLCHOLINE CHLORIDE 200 MG/10ML IV SOSY
PREFILLED_SYRINGE | INTRAVENOUS | Status: DC | PRN
  Administered 2023-09-25: 100 mg via INTRAVENOUS

## 2023-09-25 MED ORDER — OXYCODONE HCL 5 MG PO TABS: 5.0000 mg | ORAL_TABLET | Freq: Four times a day (QID) | ORAL | 0 refills | Status: DC | PRN

## 2023-09-25 MED ORDER — OXYCODONE HCL 5 MG PO TABS
ORAL_TABLET | ORAL | Status: AC
  Filled 2023-09-25: qty 1

## 2023-09-25 MED ORDER — SUGAMMADEX SODIUM 200 MG/2ML IV SOLN
INTRAVENOUS | Status: DC | PRN
  Administered 2023-09-25: 300 mg via INTRAVENOUS

## 2023-09-25 MED ORDER — LACTATED RINGERS IV SOLN: INTRAVENOUS | Status: DC

## 2023-09-25 MED ORDER — OXYCODONE HCL 5 MG/5ML PO SOLN: 5.0000 mg | Freq: Once | ORAL | Status: AC | PRN

## 2023-09-25 MED ORDER — MIDAZOLAM HCL 2 MG/2ML IJ SOLN
INTRAMUSCULAR | Status: DC | PRN
Start: 2023-09-25 — End: 2023-09-25
  Administered 2023-09-25: 4 mg via INTRAVENOUS

## 2023-09-25 MED ORDER — FENTANYL CITRATE (PF) 100 MCG/2ML IJ SOLN: 25.0000 ug | INTRAMUSCULAR | Status: DC | PRN

## 2023-09-25 MED ORDER — DIAZEPAM 5 MG/ML IJ SOLN
2.5000 mg | Freq: Once | INTRAMUSCULAR | Status: AC
  Administered 2023-09-25: 2.5 mg via INTRAVENOUS
  Filled 2023-09-25: qty 2

## 2023-09-25 MED ORDER — CEFAZOLIN SODIUM-DEXTROSE 2-4 GM/100ML-% IV SOLN
INTRAVENOUS | Status: AC
  Filled 2023-09-25: qty 100

## 2023-09-25 MED ORDER — ACETAMINOPHEN 10 MG/ML IV SOLN
INTRAVENOUS | Status: AC
  Filled 2023-09-25: qty 100

## 2023-09-25 MED ORDER — SODIUM CHLORIDE 0.9 % IR SOLN
Status: DC | PRN
Start: 2023-09-25 — End: 2023-09-25
  Administered 2023-09-25: 39000 mL

## 2023-09-25 MED ORDER — ORAL CARE MOUTH RINSE: 15.0000 mL | Freq: Once | OROMUCOSAL | Status: AC

## 2023-09-25 MED ORDER — OXYCODONE HCL 5 MG PO TABS
5.0000 mg | ORAL_TABLET | Freq: Once | ORAL | Status: AC | PRN
  Administered 2023-09-25: 5 mg via ORAL

## 2023-09-25 MED ORDER — ONDANSETRON HCL 4 MG/2ML IJ SOLN
INTRAMUSCULAR | Status: DC | PRN
  Administered 2023-09-25 (×2): 4 mg via INTRAVENOUS

## 2023-09-25 MED ORDER — FENTANYL CITRATE (PF) 100 MCG/2ML IJ SOLN
INTRAMUSCULAR | Status: DC | PRN
Start: 2023-09-25 — End: 2023-09-25
  Administered 2023-09-25 (×2): 50 ug via INTRAVENOUS

## 2023-09-25 MED ORDER — MIDAZOLAM HCL 2 MG/2ML IJ SOLN
INTRAMUSCULAR | Status: AC
Start: 2023-09-25 — End: ?
  Filled 2023-09-25: qty 2

## 2023-09-25 MED ORDER — PROPOFOL 10 MG/ML IV BOLUS
INTRAVENOUS | Status: DC | PRN
Start: 2023-09-25 — End: 2023-09-25
  Administered 2023-09-25: 180 mg via INTRAVENOUS

## 2023-09-25 MED ORDER — ONDANSETRON HCL 4 MG/2ML IJ SOLN: 4.0000 mg | Freq: Once | INTRAMUSCULAR | Status: DC | PRN

## 2023-09-25 MED ORDER — DEXMEDETOMIDINE HCL IN NACL 200 MCG/50ML IV SOLN
INTRAVENOUS | Status: DC | PRN
Start: 2023-09-25 — End: 2023-09-25
  Administered 2023-09-25: 12 ug via INTRAVENOUS

## 2023-09-25 MED ORDER — ACETAMINOPHEN 10 MG/ML IV SOLN
INTRAVENOUS | Status: AC
  Filled 2023-09-25: qty 300

## 2023-09-25 MED ORDER — ACETAMINOPHEN 10 MG/ML IV SOLN: 1000.0000 mg | Freq: Once | INTRAVENOUS | Status: DC | PRN

## 2023-09-25 MED ORDER — DEXAMETHASONE SODIUM PHOSPHATE 10 MG/ML IJ SOLN
INTRAMUSCULAR | Status: DC | PRN
  Administered 2023-09-25: 10 mg via INTRAVENOUS

## 2023-09-25 SURGICAL SUPPLY — 25 items
ADAPTER IRRIG TUBE 2 SPIKE SOL (ADAPTER) ×2 IMPLANT
BAG URO DRAIN 4000ML (MISCELLANEOUS) ×1 IMPLANT
CATH URETL OPEN END 4X70 (CATHETERS) ×1 IMPLANT
CATH URTH STD 24FR FL 3W 2 (CATHETERS) ×1 IMPLANT
CONTAINER COLLECT MORCELLATR (MISCELLANEOUS) ×1 IMPLANT
DRAPE UTILITY 15X26 TOWEL STRL (DRAPES) IMPLANT
ELECTRD BIVAP BIPO 22/24 DONUT (ELECTROSURGICAL) IMPLANT
FIBER LASER MOSES 550 DFL (Laser) ×1 IMPLANT
FILTER OVERFLOW MORCELLATOR (FILTER) ×1 IMPLANT
GLOVE BIOGEL PI IND STRL 7.5 (GLOVE) ×1 IMPLANT
GOWN STRL REUS W/ TWL LRG LVL3 (GOWN DISPOSABLE) ×1 IMPLANT
GOWN STRL REUS W/ TWL XL LVL3 (GOWN DISPOSABLE) ×1 IMPLANT
HOLDER FOLEY CATH W/STRAP (MISCELLANEOUS) ×1 IMPLANT
KIT TURNOVER CYSTO (KITS) ×1 IMPLANT
MEMBRANE SLNG YLW 17 FOR INST (MISCELLANEOUS) ×1 IMPLANT
MORCELLATOR ROTATION 4.75 335 (MISCELLANEOUS) ×1 IMPLANT
PACK CYSTO AR (MISCELLANEOUS) ×1 IMPLANT
SET CYSTO W/LG BORE CLAMP LF (SET/KITS/TRAYS/PACK) ×1 IMPLANT
SET IRRIG Y TYPE TUR BLADDER L (SET/KITS/TRAYS/PACK) ×1 IMPLANT
SLEEVE PROTECTION STRL DISP (MISCELLANEOUS) ×2 IMPLANT
SOL .9 NS 3000ML IRR UROMATIC (IV SOLUTION) ×5 IMPLANT
SURGILUBE 2OZ TUBE FLIPTOP (MISCELLANEOUS) ×1 IMPLANT
SYRINGE TOOMEY IRRIG 70ML (MISCELLANEOUS) ×1 IMPLANT
TUBE PUMP MORCELLATOR PIRANHA (TUBING) ×1 IMPLANT
WATER STERILE IRR 1000ML POUR (IV SOLUTION) ×1 IMPLANT

## 2023-09-25 NOTE — Interval H&P Note (Signed)
 UROLOGY H&P UPDATE  Agree with prior H&P dated 08/27/2023.  Foley dependent urinary retention, failed multiple voiding trials despite maximal medical therapy..  Prostate measures 134g on recent CT.  Recent PSA 5.5 with reassuring 28% free, very low PSA density of 0.04.  History of a prior negative prostate biopsy for similar PSA in 2022 at [place].  Recent CT suggest possible bone and lymphadenopathy worrisome for metastatic disease of unclear primary, he has upcoming biopsy with interventional radiology in June.  He has opted to move forward with HOLEP to resume spontaneous voiding.  Even if this represents metastatic prostate cancer, he would not be a candidate for radical robotic prostatectomy in the setting of metastatic disease, and would require outlet procedure to resume spontaneous voiding.  Cardiac: RRR Lungs: CTA bilaterally  Laterality: n/a Procedure: HOLEP  Urine: Staph epidermidis, treated with culture appropriate antibiotics  We discussed the risks and benefits of HoLEP at length.  The procedure requires general anesthesia and takes 1 to 2 hours, and a holmium laser is used to enucleate the prostate and push this tissue into the bladder.  A morcellator is then used to remove this tissue, which is sent for pathology.  The vast majority(>95%) of patients are able to discharge the same day with a catheter in place for 2 to 3 days, and will follow-up in clinic for a voiding trial.  We specifically discussed the risks of bleeding, infection, retrograde ejaculation, temporary urgency and urge incontinence, very low risk of long-term incontinence, urethral stricture/bladder neck contracture, pathologic evaluation of prostate tissue and possible detection of prostate cancer or other malignancy, and possible need for additional procedures.   Brandon Pressman, MD 09/25/2023

## 2023-09-25 NOTE — Op Note (Signed)
 Date of procedure: 09/25/23  Preoperative diagnosis:  BPH with retention  Postoperative diagnosis:  Same  Procedure: HoLEP (Holmium Laser Enucleation of the Prostate)  Surgeon: Jay Meth, MD  Anesthesia: General  Complications: None  Intraoperative findings:  Large prostate with obstructing lateral lobes, significantly elevated bladder neck, moderate trabeculations but no suspicious lesions Uncomplicated HOLEP, ureteral orifices very close to the bladder neck, verumontanum intact  EBL: Minimal  Specimens: Prostate chips  Enucleation time: 35 minutes  Morcellation time: 25 minutes  Intra-op weight: 120g  Drains: 24 French three-way, 60 cc in balloon  Indication: Brandon Riley is a 63 y.o. patient with BPH and Foley dependent urinary retention.  PSA was 5.5 with 28% free, stable from prior in 2022 when he had a negative prostate biopsy at [facility].  Interestingly he had a recent CT that suggested possible metastatic disease of unclear primary with bone lesions and possible subtle lymphadenopathy.  We elected to move forward with HOLEP to get him out of Foley dependent retention, as even if this represented metastatic prostate cancer would not be a candidate for radical prostatectomy in the setting of metastatic disease, and he is eager to resume spontaneous voiding after having indwelling Foley the last few months.  After reviewing the management options for treatment, they elected to proceed with the above surgical procedure(s). We have discussed the potential benefits and risks of the procedure, side effects of the proposed treatment, the likelihood of the patient achieving the goals of the procedure, and any potential problems that might occur during the procedure or recuperation.  We specifically discussed the risks of bleeding, infection, hematuria and clot retention, need for additional procedures, possible overnight hospital stay, temporary urgency and incontinence, rare  long-term incontinence, and retrograde ejaculation.  Informed consent has been obtained.   Description of procedure:  The patient was taken to the operating room and general anesthesia was induced.  The patient was placed in the dorsal lithotomy position, prepped and draped in the usual sterile fashion, and preoperative antibiotics(Cipro  and Ancef ) were administered.  SCDs were placed for DVT prophylaxis.  A preoperative time-out was performed.   Dustin Gimenez sounds were used to gently dilated the urethra up to 58F. The 24 French continuous flow resectoscope was inserted into the urethra using the visual obturator  The prostate was large with obstructing lateral lobes and a very elevated bladder neck. The bladder was thoroughly inspected and notable for moderate trabeculations but no suspicious lesions.  The ureteral orifices were located in orthotopic position, very close to the bladder neck.    The laser was set to 2 J and 60 Hz and early apical release was performed by making a circumferential mucosal incision proximal to the sphincter.  A lambda incision was then made proximal to the verumontanum.  The prostate was enucleated en bloc circumferentially into the bladder.  The capsule was examined and laser was used for meticulous hemostasis.    The 62 French resectoscope was then switched out for the 26 French nephroscope and prostate tissue was morcellated(Piranha) and the tissue sent to pathology.  A 24 French three-way catheter was inserted easily with the aid of a catheter guide, and 60 cc were placed in the balloon.  Urine was pink.  The catheter irrigated easily with a Toomey syringe.  CBI was initiated.   The patient tolerated the procedure well without any immediate complications and was extubated and transferred to the recovery room in stable condition.  Urine was pink on fast CBI.  Disposition: Stable to PACU  Plan: Wean CBI in PACU, anticipate discharge home today with Foley removal  locally with Dr. Willye Harvey on Monday, follow-up with me in 3 to 4 months for PVR  Jay Meth, MD 09/25/2023

## 2023-09-25 NOTE — Transfer of Care (Signed)
 Immediate Anesthesia Transfer of Care Note  Patient: Brandon Riley  Procedure(s) Performed: ENUCLEATION, PROSTATE, USING LASER, WITH MORCELLATION (Prostate)  Patient Location: PACU  Anesthesia Type:General  Level of Consciousness: drowsy and patient cooperative  Airway & Oxygen Therapy: Patient Spontanous Breathing and Patient connected to face mask oxygen  Post-op Assessment: Report given to RN and Post -op Vital signs reviewed and stable  Post vital signs: Reviewed and stable  Last Vitals:  Vitals Value Taken Time  BP 129/88 09/25/23 1123  Temp    Pulse 73 09/25/23 1127  Resp 17 09/25/23 1127  SpO2 96 % 09/25/23 1127  Vitals shown include unfiled device data.  Last Pain:  Vitals:   09/25/23 0920  TempSrc: Temporal  PainSc: 0-No pain         Complications: No notable events documented.

## 2023-09-25 NOTE — Anesthesia Postprocedure Evaluation (Signed)
 Anesthesia Post Note  Patient: Brandon Riley  Procedure(s) Performed: ENUCLEATION, PROSTATE, USING LASER, WITH MORCELLATION (Prostate)  Patient location during evaluation: PACU Anesthesia Type: General Level of consciousness: awake and alert Pain management: pain level controlled Vital Signs Assessment: post-procedure vital signs reviewed and stable Respiratory status: spontaneous breathing, nonlabored ventilation, respiratory function stable and patient connected to nasal cannula oxygen Cardiovascular status: blood pressure returned to baseline and stable Postop Assessment: no apparent nausea or vomiting Anesthetic complications: no   No notable events documented.   Last Vitals:  Vitals:   09/25/23 1145 09/25/23 1200  BP: 117/82 113/81  Pulse: 71 75  Resp: 16 17  Temp:    SpO2: 97% 92%    Last Pain:  Vitals:   09/25/23 1212  TempSrc:   PainSc: 4                  Lattie Poli

## 2023-09-25 NOTE — Anesthesia Procedure Notes (Addendum)
 Procedure Name: Intubation Date/Time: 09/25/2023 9:47 AM  Performed by: Niki Barter, CRNAPre-anesthesia Checklist: Patient identified, Emergency Drugs available, Suction available and Patient being monitored Patient Re-evaluated:Patient Re-evaluated prior to induction Oxygen Delivery Method: Circle system utilized Preoxygenation: Pre-oxygenation with 100% oxygen Induction Type: IV induction Ventilation: Two handed mask ventilation required Laryngoscope Size: McGrath and 3 Grade View: Grade II Tube type: Oral Number of attempts: 1 Airway Equipment and Method: Stylet Placement Confirmation: ETT inserted through vocal cords under direct vision, positive ETCO2 and breath sounds checked- equal and bilateral Secured at: 21 cm Tube secured with: Tape Dental Injury: Teeth and Oropharynx as per pre-operative assessment

## 2023-09-25 NOTE — Discharge Instructions (Addendum)
 I have prescribed you narcotic pain medicine called Roxicodone  to take as needed, if you already have some of this at home you can use this as a refill if needed.  Continue taking your oxybutynin , I prescribed you this medication as well if you need a refill of it.  Please return if symptoms worsen.  Call your urologist in the morning if you have any concerns as well.  Follow-up with urologist.  Return if symptoms worsen.  If you notice decreased output from your Foley catheter it might be misplaced or having issue with it again.  Return.

## 2023-09-25 NOTE — Anesthesia Preprocedure Evaluation (Signed)
 Anesthesia Evaluation  Patient identified by MRN, date of birth, ID band Patient awake  General Assessment Comment:Patient had very traumatic experience during joint replacement a few months ago. Appears he was under-sedated, he remembers being painfully stuck in the back, the person holding his position was painfully pushing him down, etc. He is very fixated on making sure he is adequately sedated prior to being wheeled into the operating room.  Reviewed: Allergy & Precautions, NPO status , Patient's Chart, lab work & pertinent test results  History of Anesthesia Complications Negative for: history of anesthetic complications  Airway Mallampati: II  TM Distance: >3 FB Neck ROM: Full    Dental no notable dental hx. (+) Teeth Intact   Pulmonary sleep apnea and Continuous Positive Airway Pressure Ventilation , neg COPD, Patient abstained from smoking.Not current smoker   Pulmonary exam normal breath sounds clear to auscultation       Cardiovascular Exercise Tolerance: Good METShypertension, Pt. on medications (-) CAD and (-) Past MI (-) dysrhythmias  Rhythm:Regular Rate:Normal - Systolic murmurs    Neuro/Psych  PSYCHIATRIC DISORDERS Anxiety     negative neurological ROS     GI/Hepatic ,GERD  Medicated and Controlled,,(+)     (-) substance abuse    Endo/Other  neg diabetes    Renal/GU negative Renal ROS     Musculoskeletal   Abdominal  (+) + obese  Peds  Hematology   Anesthesia Other Findings Past Medical History: No date: Arthritis No date: BPH (benign prostatic hyperplasia) No date: Complication of anesthesia     Comment:  epidural did not work ended up with general and now               trouble w/ urinatiing needing laser enucleation of               prastate No date: GERD (gastroesophageal reflux disease) No date: Hyperlipemia No date: Hypertension No date: OSA on CPAP No date: Restless leg syndrome   Reproductive/Obstetrics                             Anesthesia Physical Anesthesia Plan  ASA: 2  Anesthesia Plan: General   Post-op Pain Management: Ofirmev  IV (intra-op)*   Induction: Intravenous  PONV Risk Score and Plan: 3 and Ondansetron , Dexamethasone , Midazolam  and Treatment may vary due to age or medical condition  Airway Management Planned: Oral ETT and Video Laryngoscope Planned  Additional Equipment: None  Intra-op Plan:   Post-operative Plan: Extubation in OR  Informed Consent: I have reviewed the patients History and Physical, chart, labs and discussed the procedure including the risks, benefits and alternatives for the proposed anesthesia with the patient or authorized representative who has indicated his/her understanding and acceptance.     Dental advisory given  Plan Discussed with: CRNA and Surgeon  Anesthesia Plan Comments: (Discussed risks of anesthesia with patient, including PONV, sore throat, lip/dental/eye damage. Rare risks discussed as well, such as cardiorespiratory and neurological sequelae, and allergic reactions. Discussed the role of CRNA in patient's perioperative care. Patient understands.  I did discuss in detail this patient's concerns about having another traumatic anesthesia experience. I expressed my empathy, and assured him we would administer sedation enough to make him comfortable enough to allay his anxiety, but that we would be unable to "put him completely to sleep" before arriving in the OR. Patient understands.)       Anesthesia Quick Evaluation

## 2023-09-25 NOTE — ED Provider Notes (Signed)
 [place] [facility] Provider Note   CSN: [id number] Arrival date & time: 09/25/23  2126     History  Chief Complaint  Patient presents with   Abdominal Pain    Brandon Riley is a 63 y.o. male.  Patient here with abdominal pain issues with his Foley catheter after having procedure to remove his prostate today.  Has a three-way Foley catheter in.  Was just discharged from the surgical PACU about 4 hours ago.  Feels a lot of pressure in his belly and bladder.  Has not passed any gas.  Has felt nauseous.  Denies any fever or chills.  Patient given fentanyl  and route by EMS.  He states that he has had maybe decreased output from the Foley.  He emptied it 1 time when he got home.  Saw some blood in the Foley catheter as well.  The history is provided by the patient.       Home Medications Prior to Admission medications   Medication Sig Start Date End Date Taking? Authorizing Provider  oxyCODONE  (ROXICODONE ) 5 MG immediate release tablet Take 1 tablet (5 mg total) by mouth every 6 (six) hours as needed for up to 10 doses. 09/25/23  Yes Jaskirat Schwieger, DO  ALPRAZolam  (XANAX ) 0.5 MG tablet Take 1 tablet (0.5 mg total) by mouth 2 (two) times daily as needed for anxiety. Patient not taking: Reported on 09/09/2023 08/25/23   Gean Keels, MD  amLODipine  (NORVASC ) 5 MG tablet TAKE 1 TABLET BY MOUTH DAILY 09/22/23   Gean Keels, MD  Blood Pressure Monitoring (BLOOD PRESSURE CUFF) MISC Check blood pressure at home once or twice a day after sitting relaxed for 15 minutes 05/12/23   Gean Keels, MD  MILK THISTLE EXTRACT PO Take 1 capsule by mouth See admin instructions. Take Mon- Fri    [provider]  oxybutynin  (DITROPAN  XL) 10 MG 24 hr tablet Take 1 tablet (10 mg total) by mouth daily as needed for up to 5 days (Bladder spasm/bladder pain). 09/25/23 09/30/23  Keshav Winegar, DO  rOPINIRole  (REQUIP ) 1 MG tablet 1-4 tabs PO  qHS Patient taking differently: Take 3 mg by mouth at bedtime. 08/25/23   Gean Keels, MD  rosuvastatin  (CRESTOR ) 10 MG tablet TAKE ONE TABLET BY MOUTH DAILY Patient taking differently: Take 10 mg by mouth every Monday, Wednesday, and Friday. 09/21/22   Gean Keels, MD  traMADol  (ULTRAM ) 50 MG tablet Take 0.5-1 tablets (25-50 mg total) by mouth every 6 (six) hours as needed for up to 5 days for severe pain (pain score 7-10). 09/25/23 09/30/23  Lawerence Pressman, MD      Allergies    Celebrex [celecoxib], Flexeril [cyclobenzaprine], and Iodinated contrast media    Review of Systems   Review of Systems  Physical Exam Updated Vital Signs BP (!) 158/99   Pulse (!) 104   Temp 98.2 F (36.8 C) (Oral)   Resp 20   Ht 5\' 7"  (1.702 m)   Wt 104.3 kg   SpO2 100%   BMI 36.02 kg/m  Physical Exam Vitals and nursing note reviewed.  Constitutional:      General: He is not in acute distress.    Appearance: He is well-developed. He is not ill-appearing.  HENT:     Head: Normocephalic and atraumatic.     Mouth/Throat:     Mouth: Mucous membranes are moist.  Eyes:     Extraocular Movements: Extraocular movements intact.  Conjunctiva/sclera: Conjunctivae normal.  Cardiovascular:     Rate and Rhythm: Normal rate and regular rhythm.     Heart sounds: Normal heart sounds. No murmur heard. Pulmonary:     Effort: Pulmonary effort is normal. No respiratory distress.     Breath sounds: Normal breath sounds.  Abdominal:     General: There is distension.     Palpations: Abdomen is soft.     Tenderness: There is abdominal tenderness.  Musculoskeletal:        General: No swelling.     Cervical back: Neck supple.  Skin:    General: Skin is warm and dry.     Capillary Refill: Capillary refill takes less than 2 seconds.  Neurological:     Mental Status: He is alert.  Psychiatric:        Mood and Affect: Mood normal.     ED Results / Procedures / Treatments   Labs (all  labs ordered are listed, but only abnormal results are displayed) Labs Reviewed  COMPREHENSIVE METABOLIC PANEL WITH GFR - Abnormal; Notable for the following components:      Result Value   Sodium 130 (*)    CO2 15 (*)    Glucose, Bld 159 (*)    Creatinine, Ser 1.25 (*)    Calcium  8.7 (*)    Anion gap 16 (*)    All other components within normal limits  CBC WITH DIFFERENTIAL/PLATELET - Abnormal; Notable for the following components:   WBC 11.7 (*)    Neutro Abs 10.0 (*)    Lymphs Abs 0.6 (*)    Monocytes Absolute 1.1 (*)    Abs Immature Granulocytes 0.10 (*)    All other components within normal limits  URINE CULTURE  URINALYSIS, W/ REFLEX TO CULTURE (INFECTION SUSPECTED)    EKG None  Radiology CT ABDOMEN PELVIS WO CONTRAST Result Date: 09/25/2023 CLINICAL DATA:  Acute nonlocalized abdominal pain. Left-sided abdominal pain. Nausea. EXAM: CT ABDOMEN AND PELVIS WITHOUT CONTRAST TECHNIQUE: Multidetector CT imaging of the abdomen and pelvis was performed following the standard protocol without IV contrast. RADIATION DOSE REDUCTION: This exam was performed according to the departmental dose-optimization program which includes automated exposure control, adjustment of the mA and/or kV according to patient size and/or use of iterative reconstruction technique. COMPARISON:  09/15/2023 FINDINGS: Lower chest: Linear atelectasis or scarring in the lung bases. Hepatobiliary: Mild diffuse fatty infiltration of the liver. No focal lesions. Gallbladder and bile ducts are normal. Pancreas: Unremarkable. No pancreatic ductal dilatation or surrounding inflammatory changes. Spleen: Normal in size without focal abnormality. Adrenals/Urinary Tract: No adrenal gland nodules. Prominent stranding around the kidneys, greater on the left. No loculated collections. Mild bilateral hydronephrosis without hydroureter. No ureteral stones are demonstrated. Bilateral renal cysts, greater on the right measuring 4.5 cm  diameter. No significant change since prior study. No additional imaging follow-up is indicated. Bladder wall is thickened. Gas in the bladder consistent with instrumentation. A Foley catheter is present. The balloon appears to be within the prostatic urethra. Small calcifications adjacent to the catheter likely represent prostate calcifications. Stomach/Bowel: Stomach is within normal limits. Appendix appears normal. No evidence of bowel wall thickening, distention, or inflammatory changes. Vascular/Lymphatic: No significant vascular findings are present. No enlarged abdominal or pelvic lymph nodes. Reproductive: Prostate gland is diffusely enlarged. Other: No free air or free fluid in the abdomen. Abdominal wall musculature appears intact. Musculoskeletal: Degenerative changes in the spine. No acute bony abnormalities. IMPRESSION: 1. Bladder wall thickening may indicate outlet obstruction or  cystitis. Correlate with urinalysis. 2. Prostatic enlargement. Foley catheter is present with retention balloon likely in the prostatic urethra. 3. Bilateral renal hydronephrosis without obstructing stone. This may be due to reflux, stasis, or infection. Stranding around the kidneys in ureters may indicate infection. 4. Mild fatty infiltration of the liver. 5. Scarring or atelectasis in the lung bases. Electronically Signed   By: Boyce Byes M.D.   On: 09/25/2023 23:20    Procedures Procedures    Medications Ordered in ED Medications  HYDROmorphone  (DILAUDID ) injection 1 mg (1 mg Intravenous Given 09/25/23 2207)  diazepam  (VALIUM ) injection 2.5 mg (2.5 mg Intravenous Given 09/25/23 2323)  HYDROmorphone  (DILAUDID ) injection 1 mg (1 mg Intravenous Given 09/26/23 0012)    ED Course/ Medical Decision Making/ A&P                                 Medical Decision Making Amount and/or Complexity of Data Reviewed Labs: ordered. Radiology: ordered.  Risk Prescription drug management.   Etter Hermann is here  with postop pain.  Normal vitals.  No fever.  Patient just had prostate surgery today.  Has Foley catheter in place.  He feels distended in his belly.  Does not think the Foley catheter is draining.  Bladder scans are somewhat equivocal.  We flushed the Foley seem to be draining well.  He has a history of hypertension high cholesterol.  Patient was given Dilaudid .  Basic labs were ordered including urinalysis.  Urinalysis difficult to interpret will send urine culture.  Overall he does have contrast allergy.  I talked with Dr. Aden Agreste with urology.  Will do a noncontrasted CT scan to evaluate.  Patient given Dilaudid  and Valium .  I suspect differential is bladder spasms versus Foley catheter issue versus less likely bladder rupture or postop complication.  Could be an ileus.  He is not passing gas.  He is a little bit nauseous.  Per my review and interpretation of labs there is no significant leukocytosis anemia or electrolyte abnormality.  Creatinine 1.2.  Bicarb is 15 but I suspect its from hyperventilation.  White count 11.7 anemia is 14.8.  CT scan shows that the Foley catheter is present with retention balloon in the prostatic urethra.  A little bit of thickening of his bladder which I suspect is likely chronic.  Does have bilateral hydro but no obstructive stone.  Overall I talked with Dr. Aden Agreste again with urology.  She states to advance the Foley catheter.  Foley has 60 cc of fluid in the balloon.  She recommended taking 10 cc out to help with what she suspects is also bladder spasms.  Patient is already taking oxybutynin  tonight when he got home.  Overall there is no large ascites or any evidence of ascites on CT scan.  There is no evidence of free air.  It is unlikely that there is a bladder rupture or other injury given scan history and physical and labs.  Patient had good improvement of his pain following advancement of Foley catheter and taken some water  out of the balloon.  Ultimately I think he is  having bladder spasms.  No major ileus on CT but suspect there is some gas related process as well.  Ultimately will hold on any antibiotics at this time.  No fever no white count.  Will send the urine culture.  Will give him a dose of IV Dilaudid  and discharge him.  Sounds  like he has prescription for oxycodone  and oxybutynin  at home.  Will send in prescriptions for these as well in case he needs more.  At this time his Foley catheter is flowing well.  He understands return precautions.  Discharge.  This chart was dictated using voice recognition software.  Despite best efforts to proofread,  errors can occur which can change the documentation meaning.         Final Clinical Impression(s) / ED Diagnoses Final diagnoses:  Post-op pain  Problem with Foley catheter, initial encounter [facility])    Rx / DC Orders ED Discharge Orders          Ordered    oxyCODONE  (ROXICODONE ) 5 MG immediate release tablet  Every 6 hours PRN        09/25/23 2344    oxybutynin  (DITROPAN  XL) 10 MG 24 hr tablet  Daily PRN        09/25/23 2344              Lowery Rue, DO 09/26/23 0017

## 2023-09-25 NOTE — ED Triage Notes (Signed)
 Pt BIBA from home- c/o L abd pain- had HELOP today, d/c today. C/o nausea.   4 mg zofran  100 mcg fentanyl  given 2046 by EMS-   PIV 20 G R FA-  Pt with foley in place at time of triage.

## 2023-09-26 ENCOUNTER — Encounter: Payer: Self-pay | Admitting: Sports Medicine

## 2023-09-26 ENCOUNTER — Encounter: Payer: Self-pay | Admitting: Urology

## 2023-09-26 ENCOUNTER — Other Ambulatory Visit: Payer: Self-pay | Admitting: Urology

## 2023-09-26 ENCOUNTER — Inpatient Hospital Stay (HOSPITAL_BASED_OUTPATIENT_CLINIC_OR_DEPARTMENT_OTHER)
Admission: EM | Admit: 2023-09-26 | Discharge: 2023-10-01 | DRG: 394 | Disposition: A | Discharge status: Home / Self Care | Attending: Family Medicine | Admitting: Family Medicine

## 2023-09-26 ENCOUNTER — Other Ambulatory Visit: Payer: Self-pay

## 2023-09-26 DIAGNOSIS — G4761 Periodic limb movement disorder: Secondary | ICD-10-CM | POA: Diagnosis present

## 2023-09-26 DIAGNOSIS — K9189 Other postprocedural complications and disorders of digestive system: Principal | ICD-10-CM | POA: Diagnosis present

## 2023-09-26 DIAGNOSIS — G4733 Obstructive sleep apnea (adult) (pediatric): Secondary | ICD-10-CM | POA: Diagnosis present

## 2023-09-26 DIAGNOSIS — K2970 Gastritis, unspecified, without bleeding: Secondary | ICD-10-CM | POA: Diagnosis not present

## 2023-09-26 DIAGNOSIS — I10 Essential (primary) hypertension: Secondary | ICD-10-CM | POA: Diagnosis present

## 2023-09-26 DIAGNOSIS — Z79899 Other long term (current) drug therapy: Secondary | ICD-10-CM

## 2023-09-26 DIAGNOSIS — Z6836 Body mass index (BMI) 36.0-36.9, adult: Secondary | ICD-10-CM

## 2023-09-26 DIAGNOSIS — Z886 Allergy status to analgesic agent status: Secondary | ICD-10-CM

## 2023-09-26 DIAGNOSIS — E871 Hypo-osmolality and hyponatremia: Secondary | ICD-10-CM | POA: Diagnosis present

## 2023-09-26 DIAGNOSIS — Y836 Removal of other organ (partial) (total) as the cause of abnormal reaction of the patient, or of later complication, without mention of misadventure at the time of the procedure: Secondary | ICD-10-CM | POA: Diagnosis present

## 2023-09-26 DIAGNOSIS — Z888 Allergy status to other drugs, medicaments and biological substances status: Secondary | ICD-10-CM

## 2023-09-26 DIAGNOSIS — M1990 Unspecified osteoarthritis, unspecified site: Secondary | ICD-10-CM | POA: Diagnosis present

## 2023-09-26 DIAGNOSIS — M51369 Other intervertebral disc degeneration, lumbar region without mention of lumbar back pain or lower extremity pain: Secondary | ICD-10-CM | POA: Diagnosis present

## 2023-09-26 DIAGNOSIS — K7581 Nonalcoholic steatohepatitis (NASH): Secondary | ICD-10-CM | POA: Diagnosis present

## 2023-09-26 DIAGNOSIS — G2581 Restless legs syndrome: Secondary | ICD-10-CM | POA: Diagnosis present

## 2023-09-26 DIAGNOSIS — Z91041 Radiographic dye allergy status: Secondary | ICD-10-CM

## 2023-09-26 DIAGNOSIS — R7303 Prediabetes: Secondary | ICD-10-CM | POA: Diagnosis present

## 2023-09-26 DIAGNOSIS — Z833 Family history of diabetes mellitus: Secondary | ICD-10-CM

## 2023-09-26 DIAGNOSIS — E785 Hyperlipidemia, unspecified: Secondary | ICD-10-CM | POA: Diagnosis present

## 2023-09-26 DIAGNOSIS — Z8616 Personal history of COVID-19: Secondary | ICD-10-CM

## 2023-09-26 DIAGNOSIS — Z96652 Presence of left artificial knee joint: Secondary | ICD-10-CM | POA: Diagnosis present

## 2023-09-26 DIAGNOSIS — R1084 Generalized abdominal pain: Secondary | ICD-10-CM | POA: Diagnosis not present

## 2023-09-26 DIAGNOSIS — N1330 Unspecified hydronephrosis: Secondary | ICD-10-CM | POA: Diagnosis present

## 2023-09-26 DIAGNOSIS — K567 Ileus, unspecified: Secondary | ICD-10-CM | POA: Diagnosis present

## 2023-09-26 DIAGNOSIS — N179 Acute kidney failure, unspecified: Principal | ICD-10-CM | POA: Diagnosis present

## 2023-09-26 DIAGNOSIS — E669 Obesity, unspecified: Secondary | ICD-10-CM | POA: Diagnosis present

## 2023-09-26 DIAGNOSIS — R109 Unspecified abdominal pain: Secondary | ICD-10-CM | POA: Diagnosis present

## 2023-09-26 DIAGNOSIS — N400 Enlarged prostate without lower urinary tract symptoms: Secondary | ICD-10-CM | POA: Diagnosis present

## 2023-09-26 DIAGNOSIS — K219 Gastro-esophageal reflux disease without esophagitis: Secondary | ICD-10-CM | POA: Diagnosis present

## 2023-09-26 MED ORDER — OXYBUTYNIN CHLORIDE ER 10 MG PO TB24
10.0000 mg | ORAL_TABLET | Freq: Every day | ORAL | Status: DC
  Filled 2023-09-26: qty 1

## 2023-09-26 MED ORDER — HYDROMORPHONE HCL 1 MG/ML IJ SOLN
1.0000 mg | Freq: Once | INTRAMUSCULAR | Status: AC
  Administered 2023-09-26: 1 mg via INTRAVENOUS
  Filled 2023-09-26: qty 1

## 2023-09-26 NOTE — ED Notes (Signed)
 Patient reports pain controlled prior to discharge.  Foley drainage lite pink colored urine

## 2023-09-27 ENCOUNTER — Other Ambulatory Visit: Payer: Self-pay

## 2023-09-27 ENCOUNTER — Emergency Department (HOSPITAL_BASED_OUTPATIENT_CLINIC_OR_DEPARTMENT_OTHER)

## 2023-09-27 ENCOUNTER — Encounter (HOSPITAL_BASED_OUTPATIENT_CLINIC_OR_DEPARTMENT_OTHER): Payer: Self-pay

## 2023-09-27 DIAGNOSIS — N1330 Unspecified hydronephrosis: Secondary | ICD-10-CM | POA: Diagnosis present

## 2023-09-27 DIAGNOSIS — N179 Acute kidney failure, unspecified: Secondary | ICD-10-CM | POA: Diagnosis not present

## 2023-09-27 DIAGNOSIS — N134 Hydroureter: Secondary | ICD-10-CM | POA: Diagnosis not present

## 2023-09-27 DIAGNOSIS — R109 Unspecified abdominal pain: Secondary | ICD-10-CM | POA: Diagnosis not present

## 2023-09-27 DIAGNOSIS — N281 Cyst of kidney, acquired: Secondary | ICD-10-CM | POA: Diagnosis not present

## 2023-09-27 DIAGNOSIS — K5900 Constipation, unspecified: Secondary | ICD-10-CM | POA: Diagnosis not present

## 2023-09-27 DIAGNOSIS — K9189 Other postprocedural complications and disorders of digestive system: Secondary | ICD-10-CM | POA: Diagnosis present

## 2023-09-27 DIAGNOSIS — K4020 Bilateral inguinal hernia, without obstruction or gangrene, not specified as recurrent: Secondary | ICD-10-CM | POA: Diagnosis not present

## 2023-09-27 DIAGNOSIS — G4710 Hypersomnia, unspecified: Secondary | ICD-10-CM | POA: Insufficient documentation

## 2023-09-27 LAB — BASIC METABOLIC PANEL WITH GFR
Anion gap: 13 (ref 5–15)
BUN: 23 mg/dL (ref 8–23)
CO2: 22 mmol/L (ref 22–32)
Calcium: 9.6 mg/dL (ref 8.9–10.3)
Chloride: 101 mmol/L (ref 98–111)
Creatinine, Ser: 1.59 mg/dL — ABNORMAL HIGH (ref 0.61–1.24)
GFR, Estimated: 49 mL/min — ABNORMAL LOW (ref >60)
Glucose, Bld: 130 mg/dL — ABNORMAL HIGH (ref 70–99)
Potassium: 3.9 mmol/L (ref 3.5–5.1)
Sodium: 135 mmol/L (ref 135–145)

## 2023-09-27 LAB — CBC WITH DIFFERENTIAL/PLATELET
Abs Immature Granulocytes: 0.06 10*3/uL (ref 0.00–0.07)
Basophils Absolute: 0 10*3/uL (ref 0.0–0.1)
Basophils Relative: 0 %
Eosinophils Absolute: 0 10*3/uL (ref 0.0–0.5)
Eosinophils Relative: 0 %
HCT: 44.1 % (ref 39.0–52.0)
Hemoglobin: 15.4 g/dL (ref 13.0–17.0)
Immature Granulocytes: 1 %
Lymphocytes Relative: 11 %
Lymphs Abs: 1.2 10*3/uL (ref 0.7–4.0)
MCH: 30.3 pg (ref 26.0–34.0)
MCHC: 34.9 g/dL (ref 30.0–36.0)
MCV: 86.6 fL (ref 80.0–100.0)
Monocytes Absolute: 1.6 10*3/uL — ABNORMAL HIGH (ref 0.1–1.0)
Monocytes Relative: 14 %
Neutro Abs: 8.5 10*3/uL — ABNORMAL HIGH (ref 1.7–7.7)
Neutrophils Relative %: 74 %
Platelets: 182 10*3/uL (ref 150–400)
RBC: 5.09 MIL/uL (ref 4.22–5.81)
RDW: 13 % (ref 11.5–15.5)
WBC: 11.5 10*3/uL — ABNORMAL HIGH (ref 4.0–10.5)
nRBC: 0 % (ref 0.0–0.2)

## 2023-09-27 LAB — COMPREHENSIVE METABOLIC PANEL WITH GFR
ALT: 20 U/L (ref 0–44)
AST: 22 U/L (ref 15–41)
Albumin: 3.6 g/dL (ref 3.5–5.0)
Alkaline Phosphatase: 60 U/L (ref 38–126)
Anion gap: 10 (ref 5–15)
BUN: 22 mg/dL (ref 8–23)
CO2: 23 mmol/L (ref 22–32)
Calcium: 9.1 mg/dL (ref 8.9–10.3)
Chloride: 99 mmol/L (ref 98–111)
Creatinine, Ser: 1.52 mg/dL — ABNORMAL HIGH (ref 0.61–1.24)
GFR, Estimated: 51 mL/min — ABNORMAL LOW (ref >60)
Glucose, Bld: 141 mg/dL — ABNORMAL HIGH (ref 70–99)
Potassium: 3.8 mmol/L (ref 3.5–5.1)
Sodium: 132 mmol/L — ABNORMAL LOW (ref 135–145)
Total Bilirubin: 2 mg/dL — ABNORMAL HIGH (ref 0.0–1.2)
Total Protein: 7.1 g/dL (ref 6.5–8.1)

## 2023-09-27 LAB — MAGNESIUM: Magnesium: 2 mg/dL (ref 1.7–2.4)

## 2023-09-27 MED ORDER — DOCUSATE SODIUM 100 MG PO CAPS
100.0000 mg | ORAL_CAPSULE | Freq: Two times a day (BID) | ORAL | Status: DC
  Administered 2023-09-27 – 2023-10-01 (×9): 100 mg via ORAL
  Filled 2023-09-27 (×9): qty 1

## 2023-09-27 MED ORDER — ONDANSETRON HCL 4 MG/2ML IJ SOLN
4.0000 mg | Freq: Once | INTRAMUSCULAR | Status: AC
  Administered 2023-09-27: 4 mg via INTRAVENOUS
  Filled 2023-09-27: qty 2

## 2023-09-27 MED ORDER — METOPROLOL TARTRATE 5 MG/5ML IV SOLN
5.0000 mg | Freq: Two times a day (BID) | INTRAVENOUS | Status: DC
  Administered 2023-09-27 – 2023-10-01 (×9): 5 mg via INTRAVENOUS
  Filled 2023-09-27 (×9): qty 5

## 2023-09-27 MED ORDER — ONDANSETRON HCL 4 MG/2ML IJ SOLN
4.0000 mg | Freq: Four times a day (QID) | INTRAMUSCULAR | Status: DC | PRN
  Administered 2023-09-28: 4 mg via INTRAVENOUS
  Filled 2023-09-27: qty 2

## 2023-09-27 MED ORDER — POTASSIUM CHLORIDE 10 MEQ/100ML IV SOLN
10.0000 meq | INTRAVENOUS | Status: AC
  Administered 2023-09-27 (×2): 10 meq via INTRAVENOUS
  Filled 2023-09-27 (×2): qty 100

## 2023-09-27 MED ORDER — HYDROMORPHONE HCL 1 MG/ML IJ SOLN
1.0000 mg | Freq: Once | INTRAMUSCULAR | Status: AC
  Administered 2023-09-27: 1 mg via INTRAVENOUS
  Filled 2023-09-27: qty 1

## 2023-09-27 MED ORDER — SODIUM CHLORIDE 0.9 % IV SOLN: INTRAVENOUS | Status: AC

## 2023-09-27 MED ORDER — LIDOCAINE HCL URETHRAL/MUCOSAL 2 % EX GEL
1.0000 | Freq: Once | CUTANEOUS | Status: DC
  Filled 2023-09-27: qty 5

## 2023-09-27 MED ORDER — SENNA 8.6 MG PO TABS
1.0000 | ORAL_TABLET | Freq: Every day | ORAL | Status: DC
  Administered 2023-09-27 – 2023-10-01 (×5): 8.6 mg via ORAL
  Filled 2023-09-27 (×5): qty 1

## 2023-09-27 MED ORDER — LACTATED RINGERS IV BOLUS
1000.0000 mL | Freq: Once | INTRAVENOUS | Status: AC
  Administered 2023-09-27: 1000 mL via INTRAVENOUS

## 2023-09-27 MED ORDER — ACETAMINOPHEN 650 MG RE SUPP: 650.0000 mg | Freq: Four times a day (QID) | RECTAL | Status: DC | PRN

## 2023-09-27 MED ORDER — ONDANSETRON HCL 4 MG PO TABS: 4.0000 mg | ORAL_TABLET | Freq: Four times a day (QID) | ORAL | Status: DC | PRN

## 2023-09-27 MED ORDER — ACETAMINOPHEN 10 MG/ML IV SOLN
1000.0000 mg | Freq: Four times a day (QID) | INTRAVENOUS | Status: AC
  Administered 2023-09-27 – 2023-09-28 (×4): 1000 mg via INTRAVENOUS
  Filled 2023-09-27 (×4): qty 100

## 2023-09-27 MED ORDER — ACETAMINOPHEN 325 MG PO TABS: 650.0000 mg | ORAL_TABLET | Freq: Four times a day (QID) | ORAL | Status: DC | PRN

## 2023-09-27 MED ORDER — HYDROMORPHONE HCL 1 MG/ML IJ SOLN: 1.0000 mg | INTRAMUSCULAR | Status: DC | PRN

## 2023-09-27 MED ORDER — BISACODYL 10 MG RE SUPP
10.0000 mg | Freq: Once | RECTAL | Status: AC
  Administered 2023-09-27: 10 mg via RECTAL
  Filled 2023-09-27: qty 1

## 2023-09-27 MED ORDER — HYDROMORPHONE HCL 1 MG/ML IJ SOLN
0.5000 mg | INTRAMUSCULAR | Status: DC | PRN
  Administered 2023-09-27 – 2023-09-28 (×14): 0.5 mg via INTRAVENOUS
  Filled 2023-09-27 (×15): qty 0.5

## 2023-09-27 MED ORDER — MAGNESIUM SULFATE 2 GM/50ML IV SOLN
2.0000 g | Freq: Once | INTRAVENOUS | Status: AC
  Administered 2023-09-27: 2 g via INTRAVENOUS
  Filled 2023-09-27: qty 50

## 2023-09-27 MED ORDER — HYDROMORPHONE HCL 1 MG/ML IJ SOLN
0.5000 mg | INTRAMUSCULAR | Status: AC | PRN
  Administered 2023-09-27 (×3): 0.5 mg via INTRAVENOUS
  Filled 2023-09-27: qty 0.5
  Filled 2023-09-27: qty 1
  Filled 2023-09-27: qty 0.5

## 2023-09-27 NOTE — ED Provider Notes (Signed)
 [place] [facility] Provider Note   CSN: [id number] Arrival date & time: 09/26/23  2350     History Chief Complaint  Patient presents with   Abdominal Pain    HPI Brandon Riley is a 63 y.o. male presenting for abdominal pain. BPH treated by prostate enculceation. Endorses severe pain since about 10PM last night.  Was seen on Friday after his procedure with urinary retention.  His Foley catheter was repositioned with successful relief of the obstruction. Pain initially improved.  Today's pain feels different it is more in his left flank. Denies fevers chills nausea vomiting syncope shortness of breath.  Main symptom is left-sided severe pain. Patient's recorded medical, surgical, social, medication list and allergies were reviewed in the Snapshot window as part of the initial history.   Review of Systems   Review of Systems  Constitutional:  Negative for chills and fever.  HENT:  Negative for ear pain and sore throat.   Eyes:  Negative for pain and visual disturbance.  Respiratory:  Negative for cough and shortness of breath.   Cardiovascular:  Negative for chest pain and palpitations.  Gastrointestinal:  Negative for abdominal pain and vomiting.  Genitourinary:  Positive for flank pain. Negative for dysuria and hematuria.  Musculoskeletal:  Negative for arthralgias and back pain.  Skin:  Negative for color change and rash.  Neurological:  Negative for seizures and syncope.  All other systems reviewed and are negative.   Physical Exam Updated Vital Signs BP (!) 154/101   Pulse 89   Temp 98.2 F (36.8 C) (Oral)   Resp 17   Ht 5\' 7"  (1.702 m)   Wt 105.7 kg   SpO2 92%   BMI 36.49 kg/m  Physical Exam Vitals and nursing note reviewed.  Constitutional:      General: He is not in acute distress.    Appearance: He is well-developed.  HENT:     Head: Normocephalic and atraumatic.  Eyes:     Conjunctiva/sclera: Conjunctivae normal.   Cardiovascular:     Rate and Rhythm: Normal rate and regular rhythm.     Heart sounds: No murmur heard. Pulmonary:     Effort: Pulmonary effort is normal. No respiratory distress.     Breath sounds: Normal breath sounds.  Abdominal:     Palpations: Abdomen is soft.     Tenderness: There is abdominal tenderness in the left upper quadrant and left lower quadrant. There is left CVA tenderness.  Musculoskeletal:        General: No swelling.     Cervical back: Neck supple.  Skin:    General: Skin is warm and dry.     Capillary Refill: Capillary refill takes less than 2 seconds.  Neurological:     Mental Status: He is alert.  Psychiatric:        Mood and Affect: Mood normal.      ED Course/ Medical Decision Making/ A&P    Procedures .Critical Care  Performed by: Onetha Bile, MD Authorized by: Onetha Bile, MD   Critical care provider statement:    Critical care time (minutes):  30   Critical care was necessary to treat or prevent imminent or life-threatening deterioration of the following conditions: Repeat IV narcotic doses with observation.   Critical care was time spent personally by me on the following activities:  Development of treatment plan with patient or surrogate, discussions with consultants, evaluation of patient's response to treatment, examination of patient, ordering and  review of laboratory studies, ordering and review of radiographic studies, ordering and performing treatments and interventions, pulse oximetry, re-evaluation of patient's condition and review of old charts   Care discussed with: accepting provider at another facility      Medications Ordered in ED Medications  HYDROmorphone  (DILAUDID ) injection 0.5 mg (0.5 mg Intravenous Given 09/27/23 0305)  HYDROmorphone  (DILAUDID ) injection 1 mg (1 mg Intravenous Given 09/27/23 0100)  ondansetron  (ZOFRAN ) injection 4 mg (4 mg Intravenous Given 09/27/23 0102)   Medical Decision Making:   Brandon Riley  is a 63 y.o. male who presented to the ED today with abdominal pain, detailed above.    Patient placed on continuous vitals and telemetry monitoring while in ED which was reviewed periodically.  Complete initial physical exam performed, notably the patient  was HDS in NAD.     Reviewed and confirmed nursing documentation for past medical history, family history, social history.    Initial Assessment:   With the patient's presentation of abdominal pain, most likely diagnosis is postprocedural pain. Other diagnoses were considered including (but not limited to) gastroenteritis, colitis, small bowel obstruction, appendicitis, cholecystitis, pancreatitis, nephrolithiasis, UTI, pyleonephritis. These are considered less likely due to history of present illness and physical exam findings.   This is most consistent with an acute life/limb threatening illness complicated by underlying chronic conditions.   Initial Plan:  CBC/CMP to evaluate for underlying infectious/metabolic etiology for patient's abdominal pain  Lipase to evaluate for pancreatitis  EKG to evaluate for cardiac source of pain  CT Ab/pelvis without contrast due to favored nephrolithiasis over GI etiology for patient's abdominal pain  Urinalysis and repeat physical assessment to evaluate for UTI/Pyelonpehritis  Empiric management of symptoms with escalating pain control and antiemetics as needed.   Initial Study Results:   Laboratory  All laboratory results reviewed without evidence of clinically relevant pathology.      EKG EKG was reviewed independently. Rate, rhythm, axis, intervals all examined and without medically relevant abnormality. ST segments without concerns for elevations.    Radiology All images reviewed independently. Agree with radiology report at this time.   CT ABDOMEN PELVIS WO CONTRAST Result Date: 09/27/2023 CLINICAL DATA:  Acute nonlocalized abdominal pain, obstipation EXAM: CT ABDOMEN AND PELVIS WITHOUT  CONTRAST TECHNIQUE: Multidetector CT imaging of the abdomen and pelvis was performed following the standard protocol without IV contrast. RADIATION DOSE REDUCTION: This exam was performed according to the departmental dose-optimization program which includes automated exposure control, adjustment of the mA and/or kV according to patient size and/or use of iterative reconstruction technique. COMPARISON:  None Available. FINDINGS: Lower chest: No acute abnormality.  Bibasilar atelectasis Hepatobiliary: Mild hepatic steatosis. No definite intrahepatic mass. No intra or extrahepatic biliary ductal dilation. Gallbladder unremarkable. Pancreas: Un remark Spleen: Unremarkable Adrenals/Urinary Tract: The adrenal glands are unremarkable. The kidneys are normal in size and position. Simple cortical cyst arises from the lower pole the right kidney for which no follow-up imaging is recommended. Exophytic lesion arises from the interpolar region of the left kidney, better characterized as a simple cortical cyst on prior examination. No follow-up imaging is recommended for these lesions. Stable mild left hydronephrosis and hydroureter to the level of the left ureterovesicular junction. Left perinephric stranding has improved slightly in the interval since prior examination. Mild bilateral perinephric stranding persists. Foley catheter has been repositioned with its retaining balloon now seen within the bladder lumen with complete decompression of the bladder. Stomach/Bowel: Stomach is within normal limits. Appendix appears normal. No evidence  of bowel wall thickening, distention, or inflammatory changes. Vascular/Lymphatic: No significant vascular findings are present. No enlarged abdominal or pelvic lymph nodes. Reproductive: Central prostate gland appears edematous the prostate gland itself is mildly enlarged. No periprostatic inflammatory stranding or fluid collection identified. Other: Tiny fat containing umbilical and  bilateral inguinal hernias. Musculoskeletal: No acute bone abnormality. No lytic or blastic bone lesion. Osseous structures are age appropriate. IMPRESSION: 1. Stable mild left hydronephrosis and hydroureter to the level of the left ureterovesicular junction. No obstructing calculus identified. Findings may relate to an intrinsic stricture and may be better assessed with ureteroscopy or CT urography 2. Improving left perinephric inflammatory stranding. 3. Interval repositioning of a Foley catheter with its retaining balloon now seen within the bladder lumen. 4. Mild hepatic steatosis. Electronically Signed   By: Worthy Heads M.D.   On: 09/27/2023 01:44   CT ABDOMEN PELVIS WO CONTRAST Result Date: 09/25/2023 CLINICAL DATA:  Acute nonlocalized abdominal pain. Left-sided abdominal pain. Nausea. EXAM: CT ABDOMEN AND PELVIS WITHOUT CONTRAST TECHNIQUE: Multidetector CT imaging of the abdomen and pelvis was performed following the standard protocol without IV contrast. RADIATION DOSE REDUCTION: This exam was performed according to the departmental dose-optimization program which includes automated exposure control, adjustment of the mA and/or kV according to patient size and/or use of iterative reconstruction technique. COMPARISON:  09/15/2023 FINDINGS: Lower chest: Linear atelectasis or scarring in the lung bases. Hepatobiliary: Mild diffuse fatty infiltration of the liver. No focal lesions. Gallbladder and bile ducts are normal. Pancreas: Unremarkable. No pancreatic ductal dilatation or surrounding inflammatory changes. Spleen: Normal in size without focal abnormality. Adrenals/Urinary Tract: No adrenal gland nodules. Prominent stranding around the kidneys, greater on the left. No loculated collections. Mild bilateral hydronephrosis without hydroureter. No ureteral stones are demonstrated. Bilateral renal cysts, greater on the right measuring 4.5 cm diameter. No significant change since prior study. No additional  imaging follow-up is indicated. Bladder wall is thickened. Gas in the bladder consistent with instrumentation. A Foley catheter is present. The balloon appears to be within the prostatic urethra. Small calcifications adjacent to the catheter likely represent prostate calcifications. Stomach/Bowel: Stomach is within normal limits. Appendix appears normal. No evidence of bowel wall thickening, distention, or inflammatory changes. Vascular/Lymphatic: No significant vascular findings are present. No enlarged abdominal or pelvic lymph nodes. Reproductive: Prostate gland is diffusely enlarged. Other: No free air or free fluid in the abdomen. Abdominal wall musculature appears intact. Musculoskeletal: Degenerative changes in the spine. No acute bony abnormalities. IMPRESSION: 1. Bladder wall thickening may indicate outlet obstruction or cystitis. Correlate with urinalysis. 2. Prostatic enlargement. Foley catheter is present with retention balloon likely in the prostatic urethra. 3. Bilateral renal hydronephrosis without obstructing stone. This may be due to reflux, stasis, or infection. Stranding around the kidneys in ureters may indicate infection. 4. Mild fatty infiltration of the liver. 5. Scarring or atelectasis in the lung bases. Electronically Signed   By: Boyce Byes M.D.   On: 09/25/2023 23:20   CT CHEST ABDOMEN PELVIS W CONTRAST Result Date: 09/15/2023 CLINICAL DATA:  Metastatic disease evaluation, potentially malignant lesion of the sacrum incidentally identified by prior lumbar MR * Tracking Code: BO * EXAM: CT CHEST, ABDOMEN, AND PELVIS WITH CONTRAST TECHNIQUE: Multidetector CT imaging of the chest, abdomen and pelvis was performed following the standard protocol during bolus administration of intravenous contrast. RADIATION DOSE REDUCTION: This exam was performed according to the departmental dose-optimization program which includes automated exposure control, adjustment of the mA and/or kV according  to  patient size and/or use of iterative reconstruction technique. CONTRAST:  OMNIPAQUE  IOHEXOL  300 MG/ML  SOLN COMPARISON:  MR sacrum, 08/11/2023 FINDINGS: CT CHEST FINDINGS Cardiovascular: No significant vascular findings. Normal heart size. No pericardial effusion. Mediastinum/Nodes: No enlarged mediastinal, hilar, or axillary lymph nodes. Thyroid  gland, trachea, and esophagus demonstrate no significant findings. Lungs/Pleura: Mild, bandlike scarring of the left lung base. Mild, diffuse bilateral bronchial wall thickening and fine centrilobular nodularity throughout the lungs as well as multiple discrete nodules, for example a 0.4 cm nodule in the right middle lobe (series 3, image 85), a 0.7 cm fissural nodule in the right lower lobe (series 3, image 98), and a 0.3 cm nodule in the posterior left upper lobe (series 3, image 47). No pleural effusion or pneumothorax. Musculoskeletal: No chest wall abnormality. No acute osseous findings. CT ABDOMEN PELVIS FINDINGS Hepatobiliary: No solid liver abnormality is seen. Hepatic steatosis. No gallstones, gallbladder wall thickening, or biliary dilatation. Pancreas: Unremarkable. No pancreatic ductal dilatation or surrounding inflammatory changes. Spleen: Normal in size without significant abnormality. Adrenals/Urinary Tract: Adrenal glands are unremarkable. Simple, benign bilateral renal cortical cysts, for which no further follow-up or characterization is required. Kidneys are otherwise normal, without renal calculi, solid lesion, or hydronephrosis. Thickening of the urinary bladder wall, decompressed by Foley catheter. Stomach/Bowel: Stomach is within normal limits. Appendix appears normal. No evidence of bowel wall thickening, distention, or inflammatory changes. Vascular/Lymphatic: No significant vascular findings are present. Prominent bilateral pelvic sidewall and iliac lymph nodes, left pelvic sidewall lymph nodes measuring up to 2.0 x 0.7 cm (series 2, image  111). Reproductive: Severe prostatomegaly. Other: No abdominal wall hernia or abnormality. No ascites. Musculoskeletal: No acute osseous findings. Contrast enhancing osseous lesion in the right hemisacrum has a very subtle sclerotic CT correlate (series 2, image 97). No other osseous lesions are appreciated by CT, noting that the CT sensitivity for additional lesions of this character is most likely very limited. Disc degenerative disease and bridging osteophytosis throughout the thoracic and lumbar spine, in keeping with DISH IMPRESSION: 1. Contrast enhancing osseous lesion in the right hemisacrum has a very subtle sclerotic CT correlate. No other osseous lesions are appreciated by CT, noting that the CT sensitivity for additional lesions of this character is most likely very limited. Consider nuclear scintigraphic whole-body bone scan or PET-CT for more sensitive assessment. 2. Severe prostatomegaly. 3. Prominent bilateral pelvic sidewall and iliac lymph nodes. 4. Above constellation of findings is highly suspicious for prostate malignancy with associated metastatic disease. 5. Mild, diffuse bilateral bronchial wall thickening and fine centrilobular nodularity throughout the lungs as well as multiple discrete nodules, measuring up to 0.7 cm. Background findings are most commonly seen in smoking-related respiratory bronchiolitis although generally infectious or inflammatory. Discrete nodules are nonspecific, small metastases not excluded. Attention on follow-up. 6. Hepatic steatosis. Electronically Signed   By: Fredricka Jenny M.D.   On: 09/15/2023 11:47   US  Venous Img Lower Unilateral Left Result Date: 08/31/2023 CLINICAL DATA:  The patient has left calf swelling for approximately 1 day. 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 COMPARISON:  None Available. FINDINGS: VENOUS Normal compressibility of the common femoral, superficial femoral, and popliteal veins, as well as the visualized calf veins. Visualized portions of profunda femoral vein and great  saphenous vein unremarkable. No filling defects to suggest DVT on grayscale or color Doppler imaging. Doppler waveforms show normal direction of venous flow, normal respiratory plasticity and response to augmentation. Minimal enlargement of groin lymph nodes. Limited views of the contralateral common femoral vein are unremarkable. OTHER Organized left popliteal fluid collection likely representing a Baker cyst measuring 5.8 cm x 1.3 cm x 4.6 cm. Limitations: none IMPRESSION: Negative for DVT. Electronically Signed   By: Susan Ensign   On: 08/31/2023 13:17     Consults: Case discussed with urology on-call.   Final Reassessment and Plan:   Discussed with on-call urology.  They agreed with admission to medicine for AKI evaluation and they will see in the morning for further evaluation of whether ureteroscopy is needed.  Consulted medical provider for ongoing care management. Notably, patient required repeated doses of IV narcotics for maintenance of severe left-sided flank pain symptoms.  Disposition:   Based on the above findings, I believe this patient is stable for admission.    Patient/family educated about specific findings on our evaluation and explained exact reasons for admission.  Patient/family educated about clinical situation and time was allowed to answer questions.   Admission team communicated with and agreed with need for admission. Patient admitted. Patient ready to move at this time.     Emergency Department Medication Summary:   Medications  HYDROmorphone  (DILAUDID ) injection 0.5 mg (0.5 mg Intravenous Given 09/27/23 0305)  HYDROmorphone  (DILAUDID )  injection 1 mg (1 mg Intravenous Given 09/27/23 0100)  ondansetron  (ZOFRAN ) injection 4 mg (4 mg Intravenous Given 09/27/23 0102)             Clinical Impression: No diagnosis found.   Admit   Final Clinical Impression(s) / ED Diagnoses Final diagnoses:  None    Rx / DC Orders ED Discharge Orders     None         Onetha Bile, MD 09/27/23 [phone]

## 2023-09-27 NOTE — ED Notes (Signed)
 [facility] called for transport.

## 2023-09-27 NOTE — ED Triage Notes (Signed)
 Complaining of pain in the abdomen that started around 10 pm. He said that he has not been able to pass gas or have a Bowel Movement since his procedure. Said that the foley is not draining but has 500 ml in foley bag.

## 2023-09-27 NOTE — Progress Notes (Signed)
   09/27/23 2234  BiPAP/CPAP/SIPAP  $ Non-Invasive Home Ventilator  Initial  $ Face Mask Medium Yes  BiPAP/CPAP/SIPAP Pt Type Adult  BiPAP/CPAP/SIPAP Resmed  Mask Type Full face mask  Dentures removed? Not applicable  Mask Size Medium  FiO2 (%) 21 %  Patient Home Machine No  Patient Home Mask No  Patient Home Tubing No  Auto Titrate Yes  Minimum cmH2O 4 cmH2O  Maximum cmH2O 20 cmH2O  Nasal massage performed Yes  CPAP/SIPAP surface wiped down Yes  Device Plugged into RED Power Outlet Yes  BiPAP/CPAP /SiPAP Vitals  Pulse Rate 86  Resp 18  SpO2 92 %  Bilateral Breath Sounds Clear  MEWS Score/Color  MEWS Score 0  MEWS Score Color Brandon Riley

## 2023-09-27 NOTE — Consult Note (Signed)
 Urology Consult Note   Requesting Attending Physician:  Danice Dural, MD Service Providing Consult: Urology  Consulting Attending: Perley Bradley, MD   Reason for Consult:  abdominal pain, hydronephrosis  HPI: Brandon Riley is seen in consultation for reasons noted above at the request of Danice Dural, MD for evaluation of the above.  This is a 63 y.o. male with a history of BPH s/p HoLEP on 09/25/23 with Dr. [name]. He was discharged on POD#0 with catheter in place. He presented to the ED the same night with abdominal pain, CT showed foley balloon in the prostate fossa as well as mild left hydronephrosis. This was deflated and advanced into the bladder with adequate urine return. He was discharged from the ED with bladder spasm medications. He re-presented to the ED on 5/31 with continued abdominal pain. Has not passed flatus since the OR. Repeat CT demonstrated persistent left hydronephrosis, adequately decompressed bladder. Labs notable for AKI to 1.5. He was admitted to the hospitalist for AKI and pain management.  On exam today patient is distended with diffuse abdominal pain. His foley is draining clear yellow urine. Given plan for TOV on 6/2 in office, elected to remove foley this morning. I believe his pain is secondary to bladder spasms and perhaps a developing ileus given distension and lack of flatus. Will maximize bowel regimen while hospitalized.    Past Medical History: Past Medical History:  Diagnosis Date   Arthritis    BPH (benign prostatic hyperplasia)    Complication of anesthesia    epidural did not work ended up with general and now trouble w/ urinatiing needing laser enucleation of prastate   GERD (gastroesophageal reflux disease)    Hyperlipemia    Hypertension    OSA on CPAP    Restless leg syndrome     Past Surgical History:  Past Surgical History:  Procedure Laterality Date   CHONDROPLASTY Left 10/28/2018   Procedure: CHONDROPLASTY;  Surgeon:  Micheline Ahr, MD;  Location: [facility];  Service: Orthopedics;  Laterality: Left;   HOLEP-LASER ENUCLEATION OF THE PROSTATE WITH MORCELLATION N/A 09/25/2023   Procedure: ENUCLEATION, PROSTATE, USING LASER, WITH MORCELLATION;  Surgeon: Lawerence Pressman, MD;  Location: [facility];  Service: Urology;  Laterality: N/A;   KNEE ARTHROSCOPY WITH MEDIAL MENISECTOMY Left 10/28/2018   Procedure: LEFT KNEE ARTHROSCOPY WITH MEDIAL MENISECTOMY;  Surgeon: Micheline Ahr, MD;  Location: [facility];  Service: Orthopedics;  Laterality: Left;   RECONSTRUCTION OF NOSE  2019   TOTAL KNEE ARTHROPLASTY Left 07/20/2023   Procedure: ARTHROPLASTY, KNEE, TOTAL;  Surgeon: Murleen Arms, MD;  Location: [facility];  Service: Orthopedics;  Laterality: Left;   WISDOM TOOTH EXTRACTION  2012    Medication: Current Facility-Administered Medications  Medication Dose Route Frequency Provider Last Rate Last Admin   acetaminophen  (TYLENOL ) tablet 650 mg  650 mg Oral Q6H PRN Danice Dural, MD       Or   acetaminophen  (TYLENOL ) suppository 650 mg  650 mg Rectal Q6H PRN Danice Dural, MD       bisacodyl (DULCOLAX) suppository 10 mg  10 mg Rectal Once Camrin Lapre, MD       HYDROmorphone  (DILAUDID ) injection 1 mg  1 mg Intravenous Q4H PRN Danice Dural, MD       ondansetron  (ZOFRAN ) tablet 4 mg  4 mg Oral Q6H PRN Danice Dural, MD       Or   ondansetron  (ZOFRAN ) injection  4 mg  4 mg Intravenous Q6H PRN Danice Dural, MD        Allergies: Allergies  Allergen Reactions   Celebrex [Celecoxib] Palpitations    Heavy breathing   Flexeril [Cyclobenzaprine] Hives   Iodinated Contrast Media Rash    Rash for 3 days on arm where IV was placed and then next day it spread to the body    Social History: Social History   Tobacco Use   Smoking status: Never    Passive exposure: Never   Smokeless tobacco: Never  Vaping Use   Vaping status: Never Used  Substance Use Topics    Alcohol  use: Not Currently    Comment: 2-3x per week   Drug use: Never    Family History Family History  Problem Relation Age of Onset   Dementia Mother    Heart Problems Mother        Double bypass   Diabetes Mother    Other Father        Prostate Issues    Review of Systems 10 systems were reviewed and are negative except as noted specifically in the HPI.  Objective   Vital signs in last 24 hours: BP (!) 176/112 (BP Location: Right Arm)   Pulse (!) 107   Temp 98.5 F (36.9 C)   Resp 13   Ht 5\' 7"  (1.702 m)   Wt 105.7 kg   SpO2 99%   BMI 36.49 kg/m   Physical Exam General: NAD, A&O, resting, appropriate HEENT: [place]/AT, EOMI, MMM Pulmonary: Normal work of breathing Cardiovascular: HDS, adequate peripheral perfusion Abdomen: Soft, distended, tympanic, diffusely tender to palpation. GU: Foley draining clear dark amber urine, No CVA tenderness Extremities: warm and well perfused Neuro: Appropriate, no focal neurological deficits  Most Recent Labs: Lab Results  Component Value Date   WBC 11.5 (H) 09/27/2023   HGB 15.4 09/27/2023   HCT 44.1 09/27/2023   PLT 182 09/27/2023    Lab Results  Component Value Date   NA 135 09/27/2023   K 3.9 09/27/2023   CL 101 09/27/2023   CO2 22 09/27/2023   BUN 23 09/27/2023   CREATININE 1.59 (H) 09/27/2023   CALCIUM  9.6 09/27/2023    No results found for: "INR", "APTT"   Urine Culture: @[id number] (laburin,org,[id number])@   IMAGING: CT ABDOMEN PELVIS WO CONTRAST Result Date: 09/27/2023 CLINICAL DATA:  Acute nonlocalized abdominal pain, obstipation EXAM: CT ABDOMEN AND PELVIS WITHOUT CONTRAST TECHNIQUE: Multidetector CT imaging of the abdomen and pelvis was performed following the standard protocol without IV contrast. RADIATION DOSE REDUCTION: This exam was performed according to the departmental dose-optimization program which includes automated exposure control, adjustment of the mA and/or kV according to patient size  and/or use of iterative reconstruction technique. COMPARISON:  None Available. FINDINGS: Lower chest: No acute abnormality.  Bibasilar atelectasis Hepatobiliary: Mild hepatic steatosis. No definite intrahepatic mass. No intra or extrahepatic biliary ductal dilation. Gallbladder unremarkable. Pancreas: Un remark Spleen: Unremarkable Adrenals/Urinary Tract: The adrenal glands are unremarkable. The kidneys are normal in size and position. Simple cortical cyst arises from the lower pole the right kidney for which no follow-up imaging is recommended. Exophytic lesion arises from the interpolar region of the left kidney, better characterized as a simple cortical cyst on prior examination. No follow-up imaging is recommended for these lesions. Stable mild left hydronephrosis and hydroureter to the level of the left ureterovesicular junction. Left perinephric stranding has improved slightly in the interval since prior examination. Mild bilateral perinephric stranding persists. Foley  catheter has been repositioned with its retaining balloon now seen within the bladder lumen with complete decompression of the bladder. Stomach/Bowel: Stomach is within normal limits. Appendix appears normal. No evidence of bowel wall thickening, distention, or inflammatory changes. Vascular/Lymphatic: No significant vascular findings are present. No enlarged abdominal or pelvic lymph nodes. Reproductive: Central prostate gland appears edematous the prostate gland itself is mildly enlarged. No periprostatic inflammatory stranding or fluid collection identified. Other: Tiny fat containing umbilical and bilateral inguinal hernias. Musculoskeletal: No acute bone abnormality. No lytic or blastic bone lesion. Osseous structures are age appropriate. IMPRESSION: 1. Stable mild left hydronephrosis and hydroureter to the level of the left ureterovesicular junction. No obstructing calculus identified. Findings may relate to an intrinsic stricture and may  be better assessed with ureteroscopy or CT urography 2. Improving left perinephric inflammatory stranding. 3. Interval repositioning of a Foley catheter with its retaining balloon now seen within the bladder lumen. 4. Mild hepatic steatosis. Electronically Signed   By: Worthy Heads M.D.   On: 09/27/2023 01:44   CT ABDOMEN PELVIS WO CONTRAST Result Date: 09/25/2023 CLINICAL DATA:  Acute nonlocalized abdominal pain. Left-sided abdominal pain. Nausea. EXAM: CT ABDOMEN AND PELVIS WITHOUT CONTRAST TECHNIQUE: Multidetector CT imaging of the abdomen and pelvis was performed following the standard protocol without IV contrast. RADIATION DOSE REDUCTION: This exam was performed according to the departmental dose-optimization program which includes automated exposure control, adjustment of the mA and/or kV according to patient size and/or use of iterative reconstruction technique. COMPARISON:  09/15/2023 FINDINGS: Lower chest: Linear atelectasis or scarring in the lung bases. Hepatobiliary: Mild diffuse fatty infiltration of the liver. No focal lesions. Gallbladder and bile ducts are normal. Pancreas: Unremarkable. No pancreatic ductal dilatation or surrounding inflammatory changes. Spleen: Normal in size without focal abnormality. Adrenals/Urinary Tract: No adrenal gland nodules. Prominent stranding around the kidneys, greater on the left. No loculated collections. Mild bilateral hydronephrosis without hydroureter. No ureteral stones are demonstrated. Bilateral renal cysts, greater on the right measuring 4.5 cm diameter. No significant change since prior study. No additional imaging follow-up is indicated. Bladder wall is thickened. Gas in the bladder consistent with instrumentation. A Foley catheter is present. The balloon appears to be within the prostatic urethra. Small calcifications adjacent to the catheter likely represent prostate calcifications. Stomach/Bowel: Stomach is within normal limits. Appendix appears  normal. No evidence of bowel wall thickening, distention, or inflammatory changes. Vascular/Lymphatic: No significant vascular findings are present. No enlarged abdominal or pelvic lymph nodes. Reproductive: Prostate gland is diffusely enlarged. Other: No free air or free fluid in the abdomen. Abdominal wall musculature appears intact. Musculoskeletal: Degenerative changes in the spine. No acute bony abnormalities. IMPRESSION: 1. Bladder wall thickening may indicate outlet obstruction or cystitis. Correlate with urinalysis. 2. Prostatic enlargement. Foley catheter is present with retention balloon likely in the prostatic urethra. 3. Bilateral renal hydronephrosis without obstructing stone. This may be due to reflux, stasis, or infection. Stranding around the kidneys in ureters may indicate infection. 4. Mild fatty infiltration of the liver. 5. Scarring or atelectasis in the lung bases. Electronically Signed   By: Boyce Byes M.D.   On: 09/25/2023 23:20    ------  Assessment:  63 y.o. male with BPH s/p HoLEP 5/30 now with abdominal pain and left hydronephrosis.   Recommendations:  #Abdominal pain #left hydronephrosis #BPH s/p HoLEP  - Catheter removed 6/1 by urology, TOV ongoing - bowel regimen including docusate/senna and bisacodyl suppository - Ambulate 4 times daily - Minimize narcotic use to  help with constipation - Continue to trend AKI. Do feel some element likely prerenal given concentrated urine on exam. It is possible the catheter balloon was obstructing his left UO, so hopefully with foley removal and resolution of constipation we will see a downtrend.   Thank you for this consult. Please contact the urology consult pager with any further questions/concerns.

## 2023-09-27 NOTE — Progress Notes (Signed)
 Hospitalist Transfer Note:    Nursing staff, Please call [facility] Admits [facility] Consults System-Wide number on Amion [phone]) as soon as patient's arrival, so appropriate admitting provider can evaluate the pt.   Transferring facility: [facility] Requesting provider: Dr. Urban Garden (EDP at [facility]) Reason for transfer: admission for further evaluation and management of acute kidney injury and concern for obstructive uropathy.    63 year old male who recently underwent prostate enucleation, who presented to [facility] ED complaining of  1 day of progressive left flank pain.  He underwent prostate enucleation w/ [facility] on Friday, 09/25/23.  Later that day, he felt that his Foley catheter was not draining, prompting him to present to the emergency department at that time.  Following flushing as well as reposition the Foley catheter, it was determined that the Foley was draining appropriately.  He was subsequent discharged to home from the emergency department at that point.  Then, over the course of the last 1 day, he has developed worsening left flank discomfort, prompting him to present to [facility] again.  He was found to have an acute kidney injury at this time, with creatinine noted to be 1.7 relative to his preoperative creatinine of 0.7.  CT abdomen/pelvis showed peripheral stranding associated with the left kidney/left ureter, along with radiology read that included potential ureteral stricture.  EDP d/w on-call urology resident, Dr. Aden Agreste, who requested [facility] admission to [facility], and conveyed that urology will formally consult and see the patient in the morning.   Subsequently, I accepted this patient for transfer for observation to a med/tele bed at [facility] for further work-up and management of the above.      Camelia Cavalier, DO Hospitalist

## 2023-09-27 NOTE — ED Notes (Signed)
 ED TO INPATIENT HANDOFF REPORT  ED Nurse Name and Phone #:   S Name/Age/Gender Brandon Riley 63 y.o. male Room/Bed: MH09/MH09  Code Status   Code Status: Prior  Home/SNF/Other Home Patient oriented to: self, place, time, and situation Is this baseline? Yes   Triage Complete: Triage complete  Chief Complaint AKI (acute kidney injury) (HCC) [N17.9]  Triage Note Complaining of pain in the abdomen that started around 10 pm. He said that he has not been able to pass gas or have a Bowel Movement since his procedure. Said that the foley is not draining but has 500 ml in foley bag.     Allergies Allergies  Allergen Reactions   Celebrex [Celecoxib] Palpitations    Heavy breathing   Flexeril [Cyclobenzaprine] Hives   Iodinated Contrast Media Rash    Rash for 3 days on arm where IV was placed and then next day it spread to the body    Level of Care/Admitting Diagnosis ED Disposition     ED Disposition  Admit   Condition  --   Comment  Hospital Area: [facility] [[id number]]  Level of Care: Telemetry [5]  Admit to tele based on following criteria: Monitor for Ischemic changes  Interfacility transfer: Yes  May place patient in observation at [facility] or [facility] if equivalent level of care is available:: No  Covid Evaluation: Asymptomatic - no recent exposure (last 10 days) testing not required  Diagnosis: AKI (acute kidney injury) [facility]) [[id number]]  Admitting Physician: HOWERTER, JUSTIN B [[id number]]  Attending Physician: HOWERTER, JUSTIN B [[id number]]          B Medical/Surgery History Past Medical History:  Diagnosis Date   Arthritis    BPH (benign prostatic hyperplasia)    Complication of anesthesia    epidural did not work ended up with general and now trouble w/ urinatiing needing laser enucleation of prastate   GERD (gastroesophageal reflux disease)    Hyperlipemia    Hypertension    OSA on CPAP    Restless leg syndrome    Past Surgical  History:  Procedure Laterality Date   CHONDROPLASTY Left 10/28/2018   Procedure: CHONDROPLASTY;  Surgeon: Micheline Ahr, MD;  Location: [facility];  Service: Orthopedics;  Laterality: Left;   HOLEP-LASER ENUCLEATION OF THE PROSTATE WITH MORCELLATION N/A 09/25/2023   Procedure: ENUCLEATION, PROSTATE, USING LASER, WITH MORCELLATION;  Surgeon: Lawerence Pressman, MD;  Location: [facility];  Service: Urology;  Laterality: N/A;   KNEE ARTHROSCOPY WITH MEDIAL MENISECTOMY Left 10/28/2018   Procedure: LEFT KNEE ARTHROSCOPY WITH MEDIAL MENISECTOMY;  Surgeon: Micheline Ahr, MD;  Location: [facility];  Service: Orthopedics;  Laterality: Left;   RECONSTRUCTION OF NOSE  2019   TOTAL KNEE ARTHROPLASTY Left 07/20/2023   Procedure: ARTHROPLASTY, KNEE, TOTAL;  Surgeon: Murleen Arms, MD;  Location: [facility];  Service: Orthopedics;  Laterality: Left;   WISDOM TOOTH EXTRACTION  2012     A IV Location/Drains/Wounds Patient Lines/Drains/Airways Status     Active Line/Drains/Airways     Name Placement date Placement time Site Days   Peripheral IV 09/27/23 20 G 1" Left Antecubital 09/27/23  0050  Antecubital  less than 1   Urethral Catheter [name] RN Latex 16 Fr. 08/20/23  1940  Latex  38   Urethral Catheter [name], MD Triple-lumen;Latex 24 Fr. 09/25/23  1103  Triple-lumen;Latex  2            Intake/Output Last 24 hours  Intake/Output Summary (Last 24 hours) at 09/27/2023 0325 Last data filed at 09/27/2023 0015 Gross per 24 hour  Intake --  Output 500 ml  Net -500 ml    Labs/Imaging Results for orders placed or performed during the hospital encounter of 09/26/23 (from the past 48 hours)  CBC with Differential     Status: Abnormal   Collection Time: 09/27/23 12:59 AM  Result Value Ref Range   WBC 11.5 (H) 4.0 - 10.5 K/uL   RBC 5.09 4.22 - 5.81 MIL/uL   Hemoglobin 15.4 13.0 - 17.0 g/dL   HCT 40.9 81.1 - 91.4 %   MCV 86.6 80.0 - 100.0 fL   MCH 30.3 26.0 - 34.0  pg   MCHC 34.9 30.0 - 36.0 g/dL   RDW 78.2 95.6 - 21.3 %   Platelets 182 150 - 400 K/uL   nRBC 0.0 0.0 - 0.2 %   Neutrophils Relative % 74 %   Neutro Abs 8.5 (H) 1.7 - 7.7 K/uL   Lymphocytes Relative 11 %   Lymphs Abs 1.2 0.7 - 4.0 K/uL   Monocytes Relative 14 %   Monocytes Absolute 1.6 (H) 0.1 - 1.0 K/uL   Eosinophils Relative 0 %   Eosinophils Absolute 0.0 0.0 - 0.5 K/uL   Basophils Relative 0 %   Basophils Absolute 0.0 0.0 - 0.1 K/uL   Immature Granulocytes 1 %   Abs Immature Granulocytes 0.06 0.00 - 0.07 K/uL    Comment: Performed at [facility], [address] [place], [place] [address]  Basic metabolic panel     Status: Abnormal   Collection Time: 09/27/23 12:59 AM  Result Value Ref Range   Sodium 135 135 - 145 mmol/L   Potassium 3.9 3.5 - 5.1 mmol/L   Chloride 101 98 - 111 mmol/L   CO2 22 22 - 32 mmol/L   Glucose, Bld 130 (H) 70 - 99 mg/dL    Comment: Glucose reference range applies only to samples taken after fasting for at least 8 hours.   BUN 23 8 - 23 mg/dL   Creatinine, Ser 8.46 (H) 0.61 - 1.24 mg/dL   Calcium  9.6 8.9 - 10.3 mg/dL   GFR, Estimated 49 (L) >60 mL/min    Comment: (NOTE) Calculated using the CKD-EPI Creatinine Equation (2021)    Anion gap 13 5 - 15    Comment: Performed at [facility], [address] [place], [place] [address]   CT ABDOMEN PELVIS WO CONTRAST Result Date: 09/27/2023 CLINICAL DATA:  Acute nonlocalized abdominal pain, obstipation EXAM: CT ABDOMEN AND PELVIS WITHOUT CONTRAST TECHNIQUE: Multidetector CT imaging of the abdomen and pelvis was performed following the standard protocol without IV contrast. RADIATION DOSE REDUCTION: This exam was performed according to the departmental dose-optimization program which includes automated exposure control, adjustment of the mA and/or kV according to patient size and/or use of iterative reconstruction technique. COMPARISON:  None Available. FINDINGS: Lower chest: No acute  abnormality.  Bibasilar atelectasis Hepatobiliary: Mild hepatic steatosis. No definite intrahepatic mass. No intra or extrahepatic biliary ductal dilation. Gallbladder unremarkable. Pancreas: Un remark Spleen: Unremarkable Adrenals/Urinary Tract: The adrenal glands are unremarkable. The kidneys are normal in size and position. Simple cortical cyst arises from the lower pole the right kidney for which no follow-up imaging is recommended. Exophytic lesion arises from the interpolar region of the left kidney, better characterized as a simple cortical cyst on prior examination. No follow-up imaging is recommended for these lesions. Stable mild left  hydronephrosis and hydroureter to the level of the left ureterovesicular junction. Left perinephric stranding has improved slightly in the interval since prior examination. Mild bilateral perinephric stranding persists. Foley catheter has been repositioned with its retaining balloon now seen within the bladder lumen with complete decompression of the bladder. Stomach/Bowel: Stomach is within normal limits. Appendix appears normal. No evidence of bowel wall thickening, distention, or inflammatory changes. Vascular/Lymphatic: No significant vascular findings are present. No enlarged abdominal or pelvic lymph nodes. Reproductive: Central prostate gland appears edematous the prostate gland itself is mildly enlarged. No periprostatic inflammatory stranding or fluid collection identified. Other: Tiny fat containing umbilical and bilateral inguinal hernias. Musculoskeletal: No acute bone abnormality. No lytic or blastic bone lesion. Osseous structures are age appropriate. IMPRESSION: 1. Stable mild left hydronephrosis and hydroureter to the level of the left ureterovesicular junction. No obstructing calculus identified. Findings may relate to an intrinsic stricture and may be better assessed with ureteroscopy or CT urography 2. Improving left perinephric inflammatory stranding. 3.  Interval repositioning of a Foley catheter with its retaining balloon now seen within the bladder lumen. 4. Mild hepatic steatosis. Electronically Signed   By: Worthy Heads M.D.   On: 09/27/2023 01:44   CT ABDOMEN PELVIS WO CONTRAST Result Date: 09/25/2023 CLINICAL DATA:  Acute nonlocalized abdominal pain. Left-sided abdominal pain. Nausea. EXAM: CT ABDOMEN AND PELVIS WITHOUT CONTRAST TECHNIQUE: Multidetector CT imaging of the abdomen and pelvis was performed following the standard protocol without IV contrast. RADIATION DOSE REDUCTION: This exam was performed according to the departmental dose-optimization program which includes automated exposure control, adjustment of the mA and/or kV according to patient size and/or use of iterative reconstruction technique. COMPARISON:  09/15/2023 FINDINGS: Lower chest: Linear atelectasis or scarring in the lung bases. Hepatobiliary: Mild diffuse fatty infiltration of the liver. No focal lesions. Gallbladder and bile ducts are normal. Pancreas: Unremarkable. No pancreatic ductal dilatation or surrounding inflammatory changes. Spleen: Normal in size without focal abnormality. Adrenals/Urinary Tract: No adrenal gland nodules. Prominent stranding around the kidneys, greater on the left. No loculated collections. Mild bilateral hydronephrosis without hydroureter. No ureteral stones are demonstrated. Bilateral renal cysts, greater on the right measuring 4.5 cm diameter. No significant change since prior study. No additional imaging follow-up is indicated. Bladder wall is thickened. Gas in the bladder consistent with instrumentation. A Foley catheter is present. The balloon appears to be within the prostatic urethra. Small calcifications adjacent to the catheter likely represent prostate calcifications. Stomach/Bowel: Stomach is within normal limits. Appendix appears normal. No evidence of bowel wall thickening, distention, or inflammatory changes. Vascular/Lymphatic: No  significant vascular findings are present. No enlarged abdominal or pelvic lymph nodes. Reproductive: Prostate gland is diffusely enlarged. Other: No free air or free fluid in the abdomen. Abdominal wall musculature appears intact. Musculoskeletal: Degenerative changes in the spine. No acute bony abnormalities. IMPRESSION: 1. Bladder wall thickening may indicate outlet obstruction or cystitis. Correlate with urinalysis. 2. Prostatic enlargement. Foley catheter is present with retention balloon likely in the prostatic urethra. 3. Bilateral renal hydronephrosis without obstructing stone. This may be due to reflux, stasis, or infection. Stranding around the kidneys in ureters may indicate infection. 4. Mild fatty infiltration of the liver. 5. Scarring or atelectasis in the lung bases. Electronically Signed   By: Boyce Byes M.D.   On: 09/25/2023 23:20    Pending Labs Unresulted Labs (From admission, onward)    None       Vitals/Pain Today's Vitals   09/27/23 0305 09/27/23 0315 09/27/23 8119  09/27/23 0324  BP:      Pulse:    89  Resp:  (!) 21  17  Temp:      TempSrc:      SpO2:   92%   Weight:      Height:      PainSc: 10-Worst pain ever       Isolation Precautions No active isolations  Medications Medications  HYDROmorphone  (DILAUDID ) injection 0.5 mg (0.5 mg Intravenous Given 09/27/23 0305)  HYDROmorphone  (DILAUDID ) injection 1 mg (1 mg Intravenous Given 09/27/23 0100)  ondansetron  (ZOFRAN ) injection 4 mg (4 mg Intravenous Given 09/27/23 0102)    Mobility walks     Focused Assessments    R Recommendations: See Admitting Provider Note  Report given to:   Additional Notes:

## 2023-09-27 NOTE — H&P (Signed)
 History and Physical    Patient: Brandon Riley:[id number] DOB: 1960/09/28 DOA: 09/26/2023 DOS: the patient was seen and examined on 09/27/2023 PCP: Gean Keels, MD  Patient coming from: Home  Chief Complaint:  Chief Complaint  Patient presents with   Abdominal Pain   HPI: Brandon Riley is a 63 y.o. male with medical history significant of GERD, hyperlipidemia, hypertension, OSA on CPAP, restless leg syndrome, steatohepatitis history, osteoarthritis, prediabetes cervical DDD, history of COVID-19, BPH s/p HoLEP on 09/25/23 with Dr. [name] 2 days ago was discharged home with POD #0 catheter in place.  He presented to [facility] emergency department with complaints of lower abdominal pain.  CT imaging: Abdomen was in the prostate fossa, but the patient had mild left hydronephrosis.  There is bilomas deflated and catheter advanced with good urine return.  He was given a prescription for oxybutynin .  However, the patient presented yesterday evening with complaints of continued abdominal pain associated with nausea and constipation.  Case was discussed with urology on-call who recommended admission.  The patient stated that he is now having abdominal distention, has not had a bowel movement or passed flatus since Thursday.  No  emesis, diarrhea, melena or hematochezia.  No flank pain, dysuria, frequency or hematuria after catheter removal. He denied fever, chills, rhinorrhea, sore throat, wheezing or hemoptysis.  No chest pain, palpitations, diaphoresis, PND, orthopnea or pitting edema of the lower extremities.  No polyuria, polydipsia, polyphagia or blurred vision.   Lab work: CBC showed a white count of 11.5 with 74% neutrophils, hemoglobin 15.4 g/dL platelets 811.  BMP showed normal electrolytes and BUN.  Glucose was 130 and creatinine 1.59 mg/dL.  Imaging: CT abdomen/pelvis without contrast showed stable mild left hydronephrosis and hydroureter to the level of the left ureterovesical  junction.  No obstructing calculus identified.  Findings may relate to an intrinsic stricture and may be better assessed with ureteroscopic or CT urography.  Improving left perinephric inflammatory stranding.  Interval repositioning of a Foley catheter with this retaining balloon and seen within the bladder lumen.  Mild hepatic asteatosis.  ED course: Initial vital signs were temperature 98.2 F, pulse 91, respiration 20, BP 152/102 mmHg O2 sat 98% on room air.  The patient received analgesics and 4 mg of blood gas during the emergency department.  Review of Systems: As mentioned in the history of present illness. All other systems reviewed and are negative.  Past Medical History:  Diagnosis Date   Arthritis    BPH (benign prostatic hyperplasia)    Complication of anesthesia    epidural did not work ended up with general and now trouble w/ urinatiing needing laser enucleation of prastate   GERD (gastroesophageal reflux disease)    Hyperlipemia    Hypertension    OSA on CPAP    Restless leg syndrome    Past Surgical History:  Procedure Laterality Date   CHONDROPLASTY Left 10/28/2018   Procedure: CHONDROPLASTY;  Surgeon: Micheline Ahr, MD;  Location: [facility];  Service: Orthopedics;  Laterality: Left;   HOLEP-LASER ENUCLEATION OF THE PROSTATE WITH MORCELLATION N/A 09/25/2023   Procedure: ENUCLEATION, PROSTATE, USING LASER, WITH MORCELLATION;  Surgeon: Lawerence Pressman, MD;  Location: [facility];  Service: Urology;  Laterality: N/A;   KNEE ARTHROSCOPY WITH MEDIAL MENISECTOMY Left 10/28/2018   Procedure: LEFT KNEE ARTHROSCOPY WITH MEDIAL MENISECTOMY;  Surgeon: Micheline Ahr, MD;  Location:  SUR[facility]ERY [facility];  Service: Orthopedics;  Laterality: Left;   RECONSTRUCTION OF  NOSE  2019   TOTAL KNEE ARTHROPLASTY Left 07/20/2023   Procedure: ARTHROPLASTY, KNEE, TOTAL;  Surgeon: Murleen Arms, MD;  Location: [facility];  Service: Orthopedics;  Laterality: Left;   WISDOM TOOTH  EXTRACTION  2012   Social History:  reports that he has never smoked. He has never been exposed to tobacco smoke. He has never used smokeless tobacco. He reports that he does not currently use alcohol . He reports that he does not use drugs.  Allergies  Allergen Reactions   Celebrex [Celecoxib] Palpitations    Heavy breathing   Flexeril [Cyclobenzaprine] Hives   Iodinated Contrast Media Rash    Rash for 3 days on arm where IV was placed and then next day it spread to the body    Family History  Problem Relation Age of Onset   Dementia Mother    Heart Problems Mother        Double bypass   Diabetes Mother    Other Father        Prostate Issues    Prior to Admission medications   Medication Sig Start Date End Date Taking? Authorizing Provider  methylPREDNISolone  (MEDROL  DOSEPAK) 4 MG TBPK tablet SMARTSIG:- Tablet(s) By Mouth - 09/19/23  Yes [provider]  ALPRAZolam  (XANAX ) 0.5 MG tablet Take 1 tablet (0.5 mg total) by mouth 2 (two) times daily as needed for anxiety. Patient not taking: Reported on 09/09/2023 08/25/23   Gean Keels, MD  amLODipine  (NORVASC ) 5 MG tablet TAKE 1 TABLET BY MOUTH DAILY 09/22/23   Gean Keels, MD  MILK THISTLE EXTRACT PO Take 1 capsule by mouth See admin instructions. Take Mon- Fri    [provider]  oxybutynin  (DITROPAN  XL) 10 MG 24 hr tablet Take 1 tablet (10 mg total) by mouth daily as needed for up to 5 days (Bladder spasm/bladder pain). 09/25/23 09/30/23  Curatolo, Adam, DO  oxyCODONE  (ROXICODONE ) 5 MG immediate release tablet Take 1 tablet (5 mg total) by mouth every 6 (six) hours as needed for up to 10 doses. 09/25/23   Curatolo, Adam, DO  rOPINIRole  (REQUIP ) 1 MG tablet 1-4 tabs PO qHS Patient taking differently: Take 3 mg by mouth at bedtime. 08/25/23   Gean Keels, MD  rosuvastatin  (CRESTOR ) 10 MG tablet TAKE ONE TABLET BY MOUTH DAILY Patient taking differently: Take 10 mg by mouth every Monday,  Wednesday, and Friday. 09/21/22   Gean Keels, MD  traMADol  (ULTRAM ) 50 MG tablet Take 0.5-1 tablets (25-50 mg total) by mouth every 6 (six) hours as needed for up to 5 days for severe pain (pain score 7-10). 09/25/23 09/30/23  Lawerence Pressman, MD    Physical Exam: Vitals:   09/27/23 0430 09/27/23 0525 09/27/23 0600 09/27/23 0630  BP: (!) 154/100 (!) 176/112    Pulse: 84 (!) 107    Resp: 17 18 15 13   Temp:  98.5 F (36.9 C)    TempSrc:      SpO2: 94% 99%    Weight:      Height:       Physical Exam Vitals and nursing note reviewed.  Constitutional:      General: He is awake. He is not in acute distress.    Appearance: He is well-developed. He is ill-appearing.  HENT:     Head: Normocephalic.     Nose: No rhinorrhea.     Mouth/Throat:     Mouth: Mucous membranes are dry.  Eyes:     General: No scleral  icterus.    Pupils: Pupils are equal, round, and reactive to light.  Neck:     Vascular: No JVD.  Cardiovascular:     Rate and Rhythm: Normal rate and regular rhythm.     Heart sounds: S1 normal and S2 normal.  Pulmonary:     Effort: Pulmonary effort is normal.     Breath sounds: Normal breath sounds. No wheezing, rhonchi or rales.  Abdominal:     General: Abdomen is protuberant. Bowel sounds are normal. There is distension.     Palpations: Abdomen is soft.     Tenderness: There is abdominal tenderness in the suprapubic area and left lower quadrant. There is no right CVA tenderness, left CVA tenderness or guarding.  Musculoskeletal:     Cervical back: Neck supple.     Right lower leg: No edema.     Left lower leg: No edema.  Skin:    General: Skin is warm and dry.  Neurological:     General: No focal deficit present.     Mental Status: He is alert and oriented to person, place, and time.  Psychiatric:        Mood and Affect: Mood normal.        Behavior: Behavior normal. Behavior is cooperative.     Data Reviewed:  Results are pending, will review when  available.  EKG: Vent. rate 96 BPM PR interval 138 ms QRS duration 96 ms QT/QTcB 332/420 ms P-R-T axes 34 62 18 Sinus rhythm Probable left atrial enlargement Minimal ST depression, inferior leads  Assessment and Plan: Principal Problem:   S/P HoLEP on 09/25/23 Presenting with:   AKI (acute kidney injury) (HCC) In the setting of:   Hydronephrosis of left kidney Observation/telemetry. Continue IV fluids. Avoid hypotension. Avoid nephrotoxins. Monitor intake and output. Monitor renal function electrolytes.  Active Problems:   Ileus, postoperative (HCC)  S/P HoLEP on 09/25/23 Minimize opioid use. Holding amlodipine  and oxybutynin . Optimize electrolytes. Continue IV hydration. Consider general surgery evaluation if no improvement.    Obstructive sleep apnea on BiPAP  with periodic limb movement disorder  Will refer due to ileus.    GERD (gastroesophageal reflux disease) Parenteral PPI.    Steatohepatitis Weight loss advised.    Prediabetes Check fasting glucose level in a.m. Weight loss and diet advised. Follow-up with primary care provider.    Hyperlipidemia Hold statin while NPO.    Benign essential hypertension Avoid calcium  channel blockers. For now will use metoprolol 5 mg IVP every 12 hours.    Lumbar degenerative disc disease Analgesics as needed.     Advance Care Planning:   Code Status: Full Code   Consults:   Family Communication:   Severity of Illness: The appropriate patient status for this patient is OBSERVATION. Observation status is judged to be reasonable and necessary in order to provide the required intensity of service to ensure the patient's safety. The patient's presenting symptoms, physical exam findings, and initial radiographic and laboratory data in the context of their medical condition is felt to place them at decreased risk for further clinical deterioration. Furthermore, it is anticipated that the patient will be medically  stable for discharge from the hospital within 2 midnights of admission.   Author: Danice Dural, MD 09/27/2023 7:59 AM  For on call review [email or web address].   This document was prepared using Dragon voice recognition software and may contain some unintended transcription errors.

## 2023-09-28 ENCOUNTER — Encounter: Admitting: Physician Assistant

## 2023-09-28 ENCOUNTER — Telehealth: Payer: Self-pay | Admitting: Urology

## 2023-09-28 ENCOUNTER — Ambulatory Visit: Admitting: Urology

## 2023-09-28 DIAGNOSIS — K9189 Other postprocedural complications and disorders of digestive system: Secondary | ICD-10-CM | POA: Diagnosis not present

## 2023-09-28 DIAGNOSIS — N400 Enlarged prostate without lower urinary tract symptoms: Secondary | ICD-10-CM | POA: Diagnosis not present

## 2023-09-28 DIAGNOSIS — G4733 Obstructive sleep apnea (adult) (pediatric): Secondary | ICD-10-CM | POA: Diagnosis not present

## 2023-09-28 DIAGNOSIS — N179 Acute kidney failure, unspecified: Secondary | ICD-10-CM | POA: Diagnosis not present

## 2023-09-28 DIAGNOSIS — G4761 Periodic limb movement disorder: Secondary | ICD-10-CM | POA: Diagnosis not present

## 2023-09-28 DIAGNOSIS — Z833 Family history of diabetes mellitus: Secondary | ICD-10-CM | POA: Diagnosis not present

## 2023-09-28 DIAGNOSIS — R7303 Prediabetes: Secondary | ICD-10-CM | POA: Diagnosis not present

## 2023-09-28 DIAGNOSIS — K5900 Constipation, unspecified: Secondary | ICD-10-CM | POA: Diagnosis not present

## 2023-09-28 DIAGNOSIS — E785 Hyperlipidemia, unspecified: Secondary | ICD-10-CM | POA: Diagnosis not present

## 2023-09-28 DIAGNOSIS — K219 Gastro-esophageal reflux disease without esophagitis: Secondary | ICD-10-CM | POA: Diagnosis not present

## 2023-09-28 DIAGNOSIS — K567 Ileus, unspecified: Secondary | ICD-10-CM | POA: Diagnosis not present

## 2023-09-28 DIAGNOSIS — Z8616 Personal history of COVID-19: Secondary | ICD-10-CM | POA: Diagnosis not present

## 2023-09-28 DIAGNOSIS — Z6836 Body mass index (BMI) 36.0-36.9, adult: Secondary | ICD-10-CM | POA: Diagnosis not present

## 2023-09-28 DIAGNOSIS — Z91041 Radiographic dye allergy status: Secondary | ICD-10-CM | POA: Diagnosis not present

## 2023-09-28 DIAGNOSIS — N1330 Unspecified hydronephrosis: Secondary | ICD-10-CM | POA: Diagnosis not present

## 2023-09-28 DIAGNOSIS — Z888 Allergy status to other drugs, medicaments and biological substances status: Secondary | ICD-10-CM | POA: Diagnosis not present

## 2023-09-28 DIAGNOSIS — E871 Hypo-osmolality and hyponatremia: Secondary | ICD-10-CM | POA: Diagnosis not present

## 2023-09-28 DIAGNOSIS — E669 Obesity, unspecified: Secondary | ICD-10-CM | POA: Diagnosis not present

## 2023-09-28 DIAGNOSIS — Z96652 Presence of left artificial knee joint: Secondary | ICD-10-CM | POA: Diagnosis not present

## 2023-09-28 DIAGNOSIS — R109 Unspecified abdominal pain: Secondary | ICD-10-CM | POA: Diagnosis present

## 2023-09-28 DIAGNOSIS — K7581 Nonalcoholic steatohepatitis (NASH): Secondary | ICD-10-CM | POA: Diagnosis not present

## 2023-09-28 DIAGNOSIS — I10 Essential (primary) hypertension: Secondary | ICD-10-CM | POA: Diagnosis not present

## 2023-09-28 DIAGNOSIS — M51369 Other intervertebral disc degeneration, lumbar region without mention of lumbar back pain or lower extremity pain: Secondary | ICD-10-CM | POA: Diagnosis not present

## 2023-09-28 DIAGNOSIS — Y836 Removal of other organ (partial) (total) as the cause of abnormal reaction of the patient, or of later complication, without mention of misadventure at the time of the procedure: Secondary | ICD-10-CM | POA: Diagnosis not present

## 2023-09-28 DIAGNOSIS — G2581 Restless legs syndrome: Secondary | ICD-10-CM | POA: Diagnosis not present

## 2023-09-28 DIAGNOSIS — R1030 Lower abdominal pain, unspecified: Secondary | ICD-10-CM | POA: Diagnosis not present

## 2023-09-28 DIAGNOSIS — Z886 Allergy status to analgesic agent status: Secondary | ICD-10-CM | POA: Diagnosis not present

## 2023-09-28 DIAGNOSIS — M1990 Unspecified osteoarthritis, unspecified site: Secondary | ICD-10-CM | POA: Diagnosis not present

## 2023-09-28 LAB — COMPREHENSIVE METABOLIC PANEL WITH GFR
ALT: 21 U/L (ref 0–44)
AST: 20 U/L (ref 15–41)
Albumin: 3 g/dL — ABNORMAL LOW (ref 3.5–5.0)
Alkaline Phosphatase: 56 U/L (ref 38–126)
Anion gap: 9 (ref 5–15)
BUN: 20 mg/dL (ref 8–23)
CO2: 21 mmol/L — ABNORMAL LOW (ref 22–32)
Calcium: 8.8 mg/dL — ABNORMAL LOW (ref 8.9–10.3)
Chloride: 100 mmol/L (ref 98–111)
Creatinine, Ser: 1.53 mg/dL — ABNORMAL HIGH (ref 0.61–1.24)
GFR, Estimated: 51 mL/min — ABNORMAL LOW (ref >60)
Glucose, Bld: 127 mg/dL — ABNORMAL HIGH (ref 70–99)
Potassium: 4 mmol/L (ref 3.5–5.1)
Sodium: 130 mmol/L — ABNORMAL LOW (ref 135–145)
Total Bilirubin: 2.1 mg/dL — ABNORMAL HIGH (ref 0.0–1.2)
Total Protein: 6.4 g/dL — ABNORMAL LOW (ref 6.5–8.1)

## 2023-09-28 LAB — CBC
HCT: 39.9 % (ref 39.0–52.0)
Hemoglobin: 13.6 g/dL (ref 13.0–17.0)
MCH: 30.8 pg (ref 26.0–34.0)
MCHC: 34.1 g/dL (ref 30.0–36.0)
MCV: 90.3 fL (ref 80.0–100.0)
Platelets: 158 10*3/uL (ref 150–400)
RBC: 4.42 MIL/uL (ref 4.22–5.81)
RDW: 13 % (ref 11.5–15.5)
WBC: 10.5 10*3/uL (ref 4.0–10.5)
nRBC: 0 % (ref 0.0–0.2)

## 2023-09-28 LAB — GLUCOSE, CAPILLARY: Glucose-Capillary: 127 mg/dL — ABNORMAL HIGH (ref 70–99)

## 2023-09-28 LAB — HIV ANTIBODY (ROUTINE TESTING W REFLEX): HIV Screen 4th Generation wRfx: NONREACTIVE

## 2023-09-28 LAB — SURGICAL PATHOLOGY

## 2023-09-28 MED ORDER — CHLORHEXIDINE GLUCONATE CLOTH 2 % EX PADS
6.0000 | MEDICATED_PAD | Freq: Every day | CUTANEOUS | Status: DC
  Administered 2023-09-28 – 2023-10-01 (×4): 6 via TOPICAL

## 2023-09-28 MED ORDER — ACETAMINOPHEN 325 MG PO TABS
650.0000 mg | ORAL_TABLET | Freq: Four times a day (QID) | ORAL | Status: AC | PRN
  Administered 2023-09-28 – 2023-09-29 (×2): 650 mg via ORAL
  Filled 2023-09-28 (×2): qty 2

## 2023-09-28 MED ORDER — FLEET ENEMA RE ENEM
1.0000 | ENEMA | Freq: Every day | RECTAL | Status: DC | PRN
  Administered 2023-09-28: 1 via RECTAL
  Filled 2023-09-28 (×2): qty 1

## 2023-09-28 MED ORDER — SODIUM CHLORIDE 0.9 % IV SOLN: INTRAVENOUS | Status: AC

## 2023-09-28 MED ORDER — POLYETHYLENE GLYCOL 3350 17 G PO PACK
17.0000 g | PACK | Freq: Every day | ORAL | Status: DC
  Administered 2023-09-28 – 2023-10-01 (×4): 17 g via ORAL
  Filled 2023-09-28 (×4): qty 1

## 2023-09-28 MED ORDER — BISACODYL 10 MG RE SUPP
10.0000 mg | Freq: Every day | RECTAL | Status: DC | PRN
  Administered 2023-09-28 – 2023-09-30 (×2): 10 mg via RECTAL
  Filled 2023-09-28 (×2): qty 1

## 2023-09-28 MED ORDER — LACTULOSE 10 GM/15ML PO SOLN
10.0000 g | Freq: Once | ORAL | Status: AC
  Administered 2023-09-28: 10 g via ORAL
  Filled 2023-09-28: qty 15

## 2023-09-28 NOTE — Progress Notes (Signed)
   09/28/23 0918  TOC Brief Assessment  Insurance and Status Reviewed  Patient has primary care physician Yes  Home environment has been reviewed single family home  Prior level of function: independent  Prior/Current Home Services No current home services  Social Drivers of Health Review SDOH reviewed no interventions necessary  Readmission risk has been reviewed Yes  Transition of care needs transition of care needs identified, TOC will continue to follow    [place], MSW, LCSW 09/28/2023 9:19 AM

## 2023-09-28 NOTE — Plan of Care (Signed)
  Problem: Clinical Measurements: Goal: Will remain free from infection Outcome: Progressing   Problem: Activity: Goal: Risk for activity intolerance will decrease Outcome: Progressing   Problem: Nutrition: Goal: Adequate nutrition will be maintained Outcome: Progressing   Problem: Elimination: Goal: Will not experience complications related to urinary retention Outcome: Progressing   Problem: Safety: Goal: Ability to remain free from injury will improve Outcome: Progressing   Problem: Education: Goal: Knowledge of General Education information will improve Description: Including pain rating scale, medication(s)/side effects and non-pharmacologic comfort measures Outcome: Not Progressing   Problem: Health Behavior/Discharge Planning: Goal: Ability to manage health-related needs will improve Outcome: Not Progressing   Problem: Clinical Measurements: Goal: Ability to maintain clinical measurements within normal limits will improve Outcome: Not Progressing Goal: Diagnostic test results will improve Outcome: Not Progressing Goal: Respiratory complications will improve Outcome: Not Progressing Goal: Cardiovascular complication will be avoided Outcome: Not Progressing   Problem: Coping: Goal: Level of anxiety will decrease Outcome: Not Progressing   Problem: Elimination: Goal: Will not experience complications related to bowel motility Outcome: Not Progressing   Problem: Pain Managment: Goal: General experience of comfort will improve and/or be controlled Outcome: Not Progressing   Problem: Skin Integrity: Goal: Risk for impaired skin integrity will decrease Outcome: Not Progressing

## 2023-09-28 NOTE — Plan of Care (Signed)
  Problem: Education: Goal: Knowledge of General Education information will improve Description: Including pain rating scale, medication(s)/side effects and non-pharmacologic comfort measures Outcome: Progressing   Problem: Clinical Measurements: Goal: Will remain free from infection Outcome: Progressing Goal: Diagnostic test results will improve Outcome: Progressing   Problem: Activity: Goal: Risk for activity intolerance will decrease Outcome: Progressing   Problem: Elimination: Goal: Will not experience complications related to bowel motility Outcome: Progressing Goal: Will not experience complications related to urinary retention Outcome: Progressing   Problem: Pain Managment: Goal: General experience of comfort will improve and/or be controlled Outcome: Progressing   Problem: Safety: Goal: Ability to remain free from injury will improve Outcome: Progressing

## 2023-09-28 NOTE — Progress Notes (Signed)
 [facility] admitting physician addendum:  Although the patient had ileus and CPAP was not recommended, a verbal order for CPAP was placed last night under my name.  This verbal order was not authorized by me or the night shift [facility] coverage.  My H&P note stated it was was going to be deferred for the moment due to abdominal distention and ileus.  The CPAP order has been discontinued.  Lucienne Ryder, MD.

## 2023-09-28 NOTE — Telephone Encounter (Signed)
 Pt wife called and left VM wanting Dr. Charmayne Cooper to know that Brandon Riley was admitted into the hospital on Friday after his surgery. She would like a call back at [phone]. I advised her that it would be tomorrow before she would get a call back.

## 2023-09-28 NOTE — Progress Notes (Signed)
 PROGRESS NOTE    Brandon Riley  NWG:[id number] DOB: 1960-06-04 DOA: 09/26/2023 PCP: Gean Keels, MD    Brief Narrative:  Brandon Riley is a 63 y.o. male with medical history significant of GERD, hyperlipidemia, hypertension, OSA on CPAP, restless leg syndrome, steatohepatitis history, osteoarthritis, prediabetes cervical DDD, history of COVID-19, BPH s/p HoLEP on 09/25/23 with Dr. Estanislao Riley 2 days ago was discharged home with POD #0 catheter in place.  He presented to outside emergency department with complaints of lower abdominal pain.  CT imaging: Abdomen was in the prostate fossa, but the patient had mild left hydronephrosis.  There is bilomas deflated and catheter advanced with good urine return.  He was given a prescription for oxybutynin .  However, the patient presented yesterday evening with complaints of continued abdominal pain associated with nausea and constipation.  Case was discussed with urology on-call who recommended admission.  The patient stated that he is now having abdominal distention, has not had a bowel movement or passed flatus since Thursday.  No  emesis,    Assessment and Plan: S/P HoLEP on 09/25/23 Presenting with:   AKI (acute kidney injury) (HCC) In the setting of:   Hydronephrosis of left kidney -Foley placed by urology as he failed voiding trial - IV fluids - Repeat check BMP in the a.m.      Ileus, postoperative (HCC)  S/P HoLEP on 09/25/23 Minimize opioid use. Holding amlodipine  and oxybutynin . Optimize electrolytes. Continue IV hydration. Aggressive bowel regimen     Obstructive sleep apnea on BiPAP  with periodic limb movement disorder  -Hold on BiPAP due to ileus     GERD (gastroesophageal reflux disease) -PPI.     Steatohepatitis/obesity Weight loss advised.      Hyperlipidemia Hold statin while NPO.     Benign essential hypertension Avoid calcium  channel blockers.     Lumbar degenerative disc disease Analgesics as needed.      DVT prophylaxis: SCDs Start: 09/27/23 0801    Code Status: Full Code   Disposition Plan:  Level of care: Telemetry Status is: Inpatient   Consultants:  Urology   Subjective: Denies any vomiting and is tolerating liquids but has severe pain when he ambulates  Objective: Vitals:   09/27/23 1700 09/27/23 2234 09/28/23 0551 09/28/23 1257  BP: (!) 134/95  (!) 151/97 (!) 153/99  Pulse: 88 86 95 87  Resp:  18 18 20   Temp: 98 F (36.7 C)  98.1 F (36.7 C) (!) 97.2 F (36.2 C)  TempSrc: Oral     SpO2: 96% 92% 94% 96%  Weight:      Height:        Intake/Output Summary (Last 24 hours) at 09/28/2023 1316 Last data filed at 09/28/2023 1113 Gross per 24 hour  Intake 1924.89 ml  Output 3700 ml  Net -1775.11 ml   Filed Weights   09/27/23 0022  Weight: 105.7 kg    Examination:   General: Appearance:    Obese male in no acute distress     Lungs:     respirations unlabored  Heart:    Normal heart rate.   MS:   All extremities are intact.    Neurologic:   Awake, alert       Data Reviewed: I have personally reviewed following labs and imaging studies  CBC: Recent Labs  Lab 09/25/23 2209 09/27/23 0059 09/28/23 0443  WBC 11.7* 11.5* 10.5  NEUTROABS 10.0* 8.5*  --   HGB 14.8 15.4 13.6  HCT 42.4 44.1  39.9  MCV 86.0 86.6 90.3  PLT 199 182 158   Basic Metabolic Panel: Recent Labs  Lab 09/25/23 2209 09/27/23 0059 09/27/23 1043 09/28/23 0443  NA 130* 135 132* 130*  K 4.2 3.9 3.8 4.0  CL 99 101 99 100  CO2 15* 22 23 21*  GLUCOSE 159* 130* 141* 127*  BUN 20 23 22 20   CREATININE 1.25* 1.59* 1.52* 1.53*  CALCIUM  8.7* 9.6 9.1 8.8*  MG  --   --  2.0  --    GFR: Estimated Creatinine Clearance: 58 mL/min (A) (by C-G formula based on SCr of 1.53 mg/dL (H)). Liver Function Tests: Recent Labs  Lab 09/25/23 2209 09/27/23 1043 09/28/23 0443  AST 25 22 20   ALT 28 20 21   ALKPHOS 76 60 56  BILITOT 1.0 2.0* 2.1*  PROT 6.8 7.1 6.4*  ALBUMIN 3.8 3.6 3.0*    No results for input(s): "LIPASE", "AMYLASE" in the last 168 hours. No results for input(s): "AMMONIA" in the last 168 hours. Coagulation Profile: No results for input(s): "INR", "PROTIME" in the last 168 hours. Cardiac Enzymes: No results for input(s): "CKTOTAL", "CKMB", "CKMBINDEX", "TROPONINI" in the last 168 hours. BNP (last 3 results) No results for input(s): "PROBNP" in the last 8760 hours. HbA1C: No results for input(s): "HGBA1C" in the last 72 hours. CBG: No results for input(s): "GLUCAP" in the last 168 hours. Lipid Profile: No results for input(s): "CHOL", "HDL", "LDLCALC", "TRIG", "CHOLHDL", "LDLDIRECT" in the last 72 hours. Thyroid  Function Tests: No results for input(s): "TSH", "T4TOTAL", "FREET4", "T3FREE", "THYROIDAB" in the last 72 hours. Anemia Panel: No results for input(s): "VITAMINB12", "FOLATE", "FERRITIN", "TIBC", "IRON", "RETICCTPCT" in the last 72 hours. Sepsis Labs: No results for input(s): "PROCALCITON", "LATICACIDVEN" in the last 168 hours.  No results found for this or any previous visit (from the past 240 hours).       Radiology Studies: CT ABDOMEN PELVIS WO CONTRAST Result Date: 09/27/2023 CLINICAL DATA:  Acute nonlocalized abdominal pain, obstipation EXAM: CT ABDOMEN AND PELVIS WITHOUT CONTRAST TECHNIQUE: Multidetector CT imaging of the abdomen and pelvis was performed following the standard protocol without IV contrast. RADIATION DOSE REDUCTION: This exam was performed according to the departmental dose-optimization program which includes automated exposure control, adjustment of the mA and/or kV according to patient size and/or use of iterative reconstruction technique. COMPARISON:  None Available. FINDINGS: Lower chest: No acute abnormality.  Bibasilar atelectasis Hepatobiliary: Mild hepatic steatosis. No definite intrahepatic mass. No intra or extrahepatic biliary ductal dilation. Gallbladder unremarkable. Pancreas: Un remark Spleen: Unremarkable  Adrenals/Urinary Tract: The adrenal glands are unremarkable. The kidneys are normal in size and position. Simple cortical cyst arises from the lower pole the right kidney for which no follow-up imaging is recommended. Exophytic lesion arises from the interpolar region of the left kidney, better characterized as a simple cortical cyst on prior examination. No follow-up imaging is recommended for these lesions. Stable mild left hydronephrosis and hydroureter to the level of the left ureterovesicular junction. Left perinephric stranding has improved slightly in the interval since prior examination. Mild bilateral perinephric stranding persists. Foley catheter has been repositioned with its retaining balloon now seen within the bladder lumen with complete decompression of the bladder. Stomach/Bowel: Stomach is within normal limits. Appendix appears normal. No evidence of bowel wall thickening, distention, or inflammatory changes. Vascular/Lymphatic: No significant vascular findings are present. No enlarged abdominal or pelvic lymph nodes. Reproductive: Central prostate gland appears edematous the prostate gland itself is mildly enlarged. No periprostatic inflammatory stranding  or fluid collection identified. Other: Tiny fat containing umbilical and bilateral inguinal hernias. Musculoskeletal: No acute bone abnormality. No lytic or blastic bone lesion. Osseous structures are age appropriate. IMPRESSION: 1. Stable mild left hydronephrosis and hydroureter to the level of the left ureterovesicular junction. No obstructing calculus identified. Findings may relate to an intrinsic stricture and may be better assessed with ureteroscopy or CT urography 2. Improving left perinephric inflammatory stranding. 3. Interval repositioning of a Foley catheter with its retaining balloon now seen within the bladder lumen. 4. Mild hepatic steatosis. Electronically Signed   By: Worthy Heads M.D.   On: 09/27/2023 01:44         Scheduled Meds:  Chlorhexidine  Gluconate Cloth  6 each Topical Daily   docusate sodium   100 mg Oral BID   lidocaine   1 Application Urethral Once   metoprolol tartrate  5 mg Intravenous Q12H   polyethylene glycol  17 g Oral Daily   senna  1 tablet Oral Daily   Continuous Infusions:  sodium chloride  125 mL/hr at 09/28/23 0945     LOS: 0 days    Time spent: 45 minutes spent on chart review, discussion with nursing staff, consultants, updating family and interview/physical exam; more than 50% of that time was spent in counseling and/or coordination of care.    Enrigue Harvard, DO [facility] Available via Epic secure chat 7am-7pm After these hours, please refer to coverage provider listed on [email or web address] 09/28/2023, 1:16 PM

## 2023-09-28 NOTE — Progress Notes (Signed)
 Subjective: First time meeting [name] this morning.  He reports severe pain after ambulation and that he has not had a bowel movement since his surgery.  Failed TOV with catheter placed.  Clear yellow urine this morning.  Objective: Vital signs in last 24 hours: Temp:  [98 F (36.7 C)-98.1 F (36.7 C)] 98.1 F (36.7 C) (06/02 0551) Pulse Rate:  [85-95] 95 (06/02 0551) Resp:  [18-20] 18 (06/02 0551) BP: (134-151)/(95-102) 151/97 (06/02 0551) SpO2:  [92 %-96 %] 94 % (06/02 0551) FiO2 (%):  [21 %] 21 % (06/01 2234)  Assessment/Plan:  63 y.o. male with BPH s/p HoLEP 5/30 now with abdominal pain and left hydronephrosis.   Recommendations:   #Abdominal pain #left hydronephrosis #BPH s/p HoLEP   - Catheter removed 6/1 by urology, failed TOV, replaced with 35f coude. - bowel regimen including docusate/senna and bisacodyl suppository. PRN fleet enema. Still no BM since surgery.  - Ambulate 4 times daily - Minimize narcotic use to help with constipation - Continue to trend AKI. Stable today. Excellent UOP. Likely 2/2 obstructive uropathy/displaced foley. - will follow  Intake/Output from previous day: 06/01 0701 - 06/02 0700 In: 1924.9 [P.O.:120; I.V.:1000; IV Piggyback:804.9] Out: 3075 [Urine:3075]  Intake/Output this shift: No intake/output data recorded.  Physical Exam:  General: Alert and oriented CV: No cyanosis Lungs: equal chest rise Abdomen: Soft, distended Gu: foley in place draining clear yellow urine  Lab Results: Recent Labs    09/25/23 2209 09/27/23 0059 09/28/23 0443  HGB 14.8 15.4 13.6  HCT 42.4 44.1 39.9   BMET Recent Labs    09/27/23 0059 09/27/23 1043 09/28/23 0443  NA 135 132* 130*  K 3.9 3.8 4.0  CL 101 99 100  CO2 22 23 21*  GLUCOSE 130* 141* 127*  BUN 23 22 20   CREATININE 1.59* 1.52* 1.53*  CALCIUM  9.6 9.1 8.8*  HGB 15.4  --  13.6  WBC 11.5*  --  10.5     Studies/Results: CT ABDOMEN PELVIS WO CONTRAST Result Date:  09/27/2023 CLINICAL DATA:  Acute nonlocalized abdominal pain, obstipation EXAM: CT ABDOMEN AND PELVIS WITHOUT CONTRAST TECHNIQUE: Multidetector CT imaging of the abdomen and pelvis was performed following the standard protocol without IV contrast. RADIATION DOSE REDUCTION: This exam was performed according to the departmental dose-optimization program which includes automated exposure control, adjustment of the mA and/or kV according to patient size and/or use of iterative reconstruction technique. COMPARISON:  None Available. FINDINGS: Lower chest: No acute abnormality.  Bibasilar atelectasis Hepatobiliary: Mild hepatic steatosis. No definite intrahepatic mass. No intra or extrahepatic biliary ductal dilation. Gallbladder unremarkable. Pancreas: Un remark Spleen: Unremarkable Adrenals/Urinary Tract: The adrenal glands are unremarkable. The kidneys are normal in size and position. Simple cortical cyst arises from the lower pole the right kidney for which no follow-up imaging is recommended. Exophytic lesion arises from the interpolar region of the left kidney, better characterized as a simple cortical cyst on prior examination. No follow-up imaging is recommended for these lesions. Stable mild left hydronephrosis and hydroureter to the level of the left ureterovesicular junction. Left perinephric stranding has improved slightly in the interval since prior examination. Mild bilateral perinephric stranding persists. Foley catheter has been repositioned with its retaining balloon now seen within the bladder lumen with complete decompression of the bladder. Stomach/Bowel: Stomach is within normal limits. Appendix appears normal. No evidence of bowel wall thickening, distention, or inflammatory changes. Vascular/Lymphatic: No significant vascular findings are present. No enlarged abdominal or pelvic lymph nodes.  Reproductive: Central prostate gland appears edematous the prostate gland itself is mildly enlarged. No  periprostatic inflammatory stranding or fluid collection identified. Other: Tiny fat containing umbilical and bilateral inguinal hernias. Musculoskeletal: No acute bone abnormality. No lytic or blastic bone lesion. Osseous structures are age appropriate. IMPRESSION: 1. Stable mild left hydronephrosis and hydroureter to the level of the left ureterovesicular junction. No obstructing calculus identified. Findings may relate to an intrinsic stricture and may be better assessed with ureteroscopy or CT urography 2. Improving left perinephric inflammatory stranding. 3. Interval repositioning of a Foley catheter with its retaining balloon now seen within the bladder lumen. 4. Mild hepatic steatosis. Electronically Signed   By: Worthy Heads M.D.   On: 09/27/2023 01:44      LOS: 0 days   Alla Ar, NP [facility] Pager: [phone]  09/28/2023, 9:45 AM

## 2023-09-28 NOTE — Plan of Care (Signed)

## 2023-09-29 ENCOUNTER — Inpatient Hospital Stay (HOSPITAL_COMMUNITY)

## 2023-09-29 DIAGNOSIS — N179 Acute kidney failure, unspecified: Secondary | ICD-10-CM | POA: Diagnosis not present

## 2023-09-29 LAB — BASIC METABOLIC PANEL WITH GFR
Anion gap: 12 (ref 5–15)
BUN: 19 mg/dL (ref 8–23)
CO2: 18 mmol/L — ABNORMAL LOW (ref 22–32)
Calcium: 8.6 mg/dL — ABNORMAL LOW (ref 8.9–10.3)
Chloride: 102 mmol/L (ref 98–111)
Creatinine, Ser: 1.48 mg/dL — ABNORMAL HIGH (ref 0.61–1.24)
GFR, Estimated: 53 mL/min — ABNORMAL LOW (ref >60)
Glucose, Bld: 134 mg/dL — ABNORMAL HIGH (ref 70–99)
Potassium: 3.6 mmol/L (ref 3.5–5.1)
Sodium: 132 mmol/L — ABNORMAL LOW (ref 135–145)

## 2023-09-29 LAB — CBC
HCT: 38.6 % — ABNORMAL LOW (ref 39.0–52.0)
Hemoglobin: 12.9 g/dL — ABNORMAL LOW (ref 13.0–17.0)
MCH: 30 pg (ref 26.0–34.0)
MCHC: 33.4 g/dL (ref 30.0–36.0)
MCV: 89.8 fL (ref 80.0–100.0)
Platelets: 173 10*3/uL (ref 150–400)
RBC: 4.3 MIL/uL (ref 4.22–5.81)
RDW: 12.8 % (ref 11.5–15.5)
WBC: 9.4 10*3/uL (ref 4.0–10.5)
nRBC: 0 % (ref 0.0–0.2)

## 2023-09-29 MED ORDER — ROPINIROLE HCL 1 MG PO TABS
3.0000 mg | ORAL_TABLET | Freq: Every day | ORAL | Status: DC
  Administered 2023-09-29 – 2023-09-30 (×2): 3 mg via ORAL
  Filled 2023-09-29 (×2): qty 3

## 2023-09-29 MED ORDER — LACTULOSE 10 GM/15ML PO SOLN
30.0000 g | Freq: Three times a day (TID) | ORAL | Status: DC
  Administered 2023-09-29: 30 g via ORAL
  Administered 2023-10-01: 10 g via ORAL
  Filled 2023-09-29 (×6): qty 45

## 2023-09-29 MED ORDER — SMOG ENEMA
960.0000 mL | Freq: Once | RECTAL | Status: AC
  Administered 2023-09-29: 960 mL via RECTAL
  Filled 2023-09-29: qty 960

## 2023-09-29 MED ORDER — ACETAMINOPHEN 10 MG/ML IV SOLN
1000.0000 mg | Freq: Four times a day (QID) | INTRAVENOUS | Status: AC
  Administered 2023-09-29 – 2023-09-30 (×3): 1000 mg via INTRAVENOUS
  Filled 2023-09-29 (×4): qty 100

## 2023-09-29 MED ORDER — SODIUM CHLORIDE 0.9 % IV SOLN: INTRAVENOUS | Status: AC

## 2023-09-29 MED ORDER — ALPRAZOLAM 0.5 MG PO TABS: 0.5000 mg | ORAL_TABLET | Freq: Every day | ORAL | Status: DC | PRN

## 2023-09-29 NOTE — Telephone Encounter (Signed)
 Provider called patient's wife 09/29/23.

## 2023-09-29 NOTE — Progress Notes (Signed)
 PROGRESS NOTE    Brandon Riley  EAV:[id number] DOB: 03-Feb-1961 DOA: 09/26/2023 PCP: Gean Keels, MD    Brief Narrative:  Brandon Riley is a 63 y.o. male with medical history significant of GERD, hyperlipidemia, hypertension, OSA on CPAP, restless leg syndrome, steatohepatitis history, osteoarthritis, prediabetes cervical DDD, history of COVID-19, BPH s/p HoLEP on 09/25/23 with Dr. [name] 2 days ago was discharged home with POD #0 catheter in place.  He presented to outside emergency department with complaints of lower abdominal pain.  CT imaging: Abdomen was in the prostate fossa, but the patient had mild left hydronephrosis.  There is bilomas deflated and catheter advanced with good urine return.  He was given a prescription for oxybutynin .  However, the patient presented yesterday evening with complaints of continued abdominal pain associated with nausea and constipation.  Case was discussed with urology on-call who recommended admission.  The patient stated that he is now having abdominal distention, has not had a bowel movement or passed flatus since Thursday.  Patient states he is passing gas but still having some abdominal discomfort still.     Assessment and Plan: S/P HoLEP on 09/25/23 Presenting with:   AKI (acute kidney injury) (HCC) In the setting of:   Hydronephrosis of left kidney -Foley placed by urology as he failed voiding trial - IV fluids - Trend BMP as creatinine improving      Ileus, postoperative (HCC)  S/P HoLEP on 09/25/23 Minimize opioid use. Holding amlodipine  and oxybutynin . Optimize electrolytes. Continue IV hydration. Aggressive bowel regimen and ambulation     Obstructive sleep apnea on BiPAP  with periodic limb movement disorder  -Resume BiPAP as ileus resolving     GERD (gastroesophageal reflux disease) -PPI.     Steatohepatitis/obesity Weight loss advised.      Hyperlipidemia Hold statin while NPO.     Benign essential  hypertension Avoid calcium  channel blockers.     Lumbar degenerative disc disease Analgesics as needed.     DVT prophylaxis: SCDs Start: 09/27/23 0801    Code Status: Full Code   Disposition Plan:  Level of care: Telemetry Status is: Inpatient  ?  Home in the AM if improved   Consultants:  Urology   Subjective: Multiple complaints this morning asking for: IV Tylenol , lactulose  Objective: Vitals:   09/28/23 0551 09/28/23 1257 09/28/23 1952 09/29/23 0620  BP: (!) 151/97 (!) 153/99 (!) 158/96 (!) 135/98  Pulse: 95 87 (!) 101 80  Resp: 18 20 18 18   Temp: 98.1 F (36.7 C) (!) 97.2 F (36.2 C) 98.8 F (37.1 C) 97.7 F (36.5 C)  TempSrc:   Oral Oral  SpO2: 94% 96% 94% 98%  Weight:      Height:        Intake/Output Summary (Last 24 hours) at 09/29/2023 1337 Last data filed at 09/29/2023 1200 Gross per 24 hour  Intake 993.62 ml  Output 1600 ml  Net -606.38 ml   Filed Weights   09/27/23 0022  Weight: 105.7 kg    Examination:   General: Appearance:    Obese male in no acute distress   Abdomen distended   Lungs:     respirations unlabored  Heart:    Normal heart rate.   MS:   All extremities are intact.    Neurologic:   Awake, alert       Data Reviewed: I have personally reviewed following labs and imaging studies  CBC: Recent Labs  Lab 09/25/23 2209 09/27/23 0059 09/28/23  5621 09/29/23 0521  WBC 11.7* 11.5* 10.5 9.4  NEUTROABS 10.0* 8.5*  --   --   HGB 14.8 15.4 13.6 12.9*  HCT 42.4 44.1 39.9 38.6*  MCV 86.0 86.6 90.3 89.8  PLT 199 182 158 173   Basic Metabolic Panel: Recent Labs  Lab 09/25/23 2209 09/27/23 0059 09/27/23 1043 09/28/23 0443 09/29/23 0521  NA 130* 135 132* 130* 132*  K 4.2 3.9 3.8 4.0 3.6  CL 99 101 99 100 102  CO2 15* 22 23 21* 18*  GLUCOSE 159* 130* 141* 127* 134*  BUN 20 23 22 20 19   CREATININE 1.25* 1.59* 1.52* 1.53* 1.48*  CALCIUM  8.7* 9.6 9.1 8.8* 8.6*  MG  --   --  2.0  --   --    GFR: Estimated  Creatinine Clearance: 59.9 mL/min (A) (by C-G formula based on SCr of 1.48 mg/dL (H)). Liver Function Tests: Recent Labs  Lab 09/25/23 2209 09/27/23 1043 09/28/23 0443  AST 25 22 20   ALT 28 20 21   ALKPHOS 76 60 56  BILITOT 1.0 2.0* 2.1*  PROT 6.8 7.1 6.4*  ALBUMIN 3.8 3.6 3.0*   No results for input(s): "LIPASE", "AMYLASE" in the last 168 hours. No results for input(s): "AMMONIA" in the last 168 hours. Coagulation Profile: No results for input(s): "INR", "PROTIME" in the last 168 hours. Cardiac Enzymes: No results for input(s): "CKTOTAL", "CKMB", "CKMBINDEX", "TROPONINI" in the last 168 hours. BNP (last 3 results) No results for input(s): "PROBNP" in the last 8760 hours. HbA1C: No results for input(s): "HGBA1C" in the last 72 hours. CBG: Recent Labs  Lab 09/28/23 1628  GLUCAP 127*   Lipid Profile: No results for input(s): "CHOL", "HDL", "LDLCALC", "TRIG", "CHOLHDL", "LDLDIRECT" in the last 72 hours. Thyroid  Function Tests: No results for input(s): "TSH", "T4TOTAL", "FREET4", "T3FREE", "THYROIDAB" in the last 72 hours. Anemia Panel: No results for input(s): "VITAMINB12", "FOLATE", "FERRITIN", "TIBC", "IRON", "RETICCTPCT" in the last 72 hours. Sepsis Labs: No results for input(s): "PROCALCITON", "LATICACIDVEN" in the last 168 hours.  No results found for this or any previous visit (from the past 240 hours).       Radiology Studies: DG Abd Portable 1V Result Date: 09/29/2023 CLINICAL DATA:  Ileus EXAM: PORTABLE ABDOMEN - 1 VIEW COMPARISON:  None Available. FINDINGS: The bowel gas pattern is normal. No radio-opaque calculi or other significant radiographic abnormality are seen. IMPRESSION: Negative. Electronically Signed   By: Fredrich Jefferson M.D.   On: 09/29/2023 08:36        Scheduled Meds:  Chlorhexidine  Gluconate Cloth  6 each Topical Daily   docusate sodium   100 mg Oral BID   lactulose  30 g Oral TID   lidocaine   1 Application Urethral Once   metoprolol  tartrate  5 mg Intravenous Q12H   polyethylene glycol  17 g Oral Daily   senna  1 tablet Oral Daily   Continuous Infusions:  sodium chloride  75 mL/hr at 09/29/23 0848   acetaminophen  1,000 mg (09/29/23 1030)     LOS: 1 day    Time spent: 45 minutes spent on chart review, discussion with nursing staff, consultants, updating family and interview/physical exam; more than 50% of that time was spent in counseling and/or coordination of care.    Enrigue Harvard, DO [facility] Available via Epic secure chat 7am-7pm After these hours, please refer to coverage provider listed on [email or web address] 09/29/2023, 1:37 PM

## 2023-09-29 NOTE — Plan of Care (Signed)
  Problem: Education: Goal: Knowledge of General Education information will improve Description: Including pain rating scale, medication(s)/side effects and non-pharmacologic comfort measures Outcome: Progressing   Problem: Clinical Measurements: Goal: Ability to maintain clinical measurements within normal limits will improve Outcome: Progressing Goal: Will remain free from infection Outcome: Progressing Goal: Diagnostic test results will improve Outcome: Progressing Goal: Cardiovascular complication will be avoided Outcome: Progressing   Problem: Coping: Goal: Level of anxiety will decrease Outcome: Progressing   Problem: Elimination: Goal: Will not experience complications related to bowel motility Outcome: Progressing Goal: Will not experience complications related to urinary retention Outcome: Progressing   Problem: Pain Managment: Goal: General experience of comfort will improve and/or be controlled Outcome: Progressing   Problem: Safety: Goal: Ability to remain free from injury will improve Outcome: Progressing   Problem: Skin Integrity: Goal: Risk for impaired skin integrity will decrease Outcome: Progressing

## 2023-09-29 NOTE — Progress Notes (Signed)
 This Clinical research associate was notified that pt family had concerns. This writer rounded on pt with 2 family members present. Pt family states that they are not happy with the "dumbass" decisions being made about pts care. Family states the foley should have not been removed per their surgeon in [place]. They state they do not want this current foley removed under any circumstances. Pt family states they do not want him to have any opioids anymore. I educated pt and family that the pt has a right to refuse orders if they choose to do so. There is currently no order to remove this foley. Pt and family expressed concerns with "being denied a CPAP" as well. I let them know I would follow up their attending and clarify if a CPAP is ordered for tonight.

## 2023-09-29 NOTE — Progress Notes (Signed)
 Per Dr. Vann, this nurse gave the results of the x-ray to patient and wife and also encouraged patient to ambulate.    Rainey Burden, RN

## 2023-09-29 NOTE — Progress Notes (Signed)
 Offered for ambulation ,he refuse this time.

## 2023-09-29 NOTE — Progress Notes (Signed)
     Subjective: Brandon Riley. Pt was receiving soap suds enema on my arrival. Pain and tolerance of ambulation improved. Lots of gas passed. He feels he is not getting adequate care d/t speed of resolution. Regardless, he still feels he needs to be in hospital for medical monitoring.   Objective: Vital signs in last 24 hours: Temp:  [97.2 F (36.2 C)-98.8 F (37.1 C)] 97.7 F (36.5 C) (06/03 0620) Pulse Rate:  [80-101] 80 (06/03 0620) Resp:  [18-20] 18 (06/03 0620) BP: (135-158)/(96-99) 135/98 (06/03 0620) SpO2:  [94 %-98 %] 98 % (06/03 0620)  Assessment/Plan: #Abdominal pain #left hydronephrosis #BPH s/p HoLEP #functional ileus   - Catheter removed 6/1 by urology, failed TOV, replaced with 72f coude. - bowel regimen including docusate/senna and bisacodyl suppository. Lot of gas passage following soap suds enema.   - Ambulate 4 times daily - Minimize narcotic use to help with constipation - Continue to trend AKI. Stable today. UOP WNL. Likely 2/2 obstructive uropathy/displaced foley. - Based on his symptoms and imaging he likely has a functional ileus.  From urologic perspective can discharge home as soon as he demonstrates that he can handle oral intake.   Intake/Output this shift: No intake/output data recorded.  Physical Exam:  General: Alert and oriented CV: No cyanosis Lungs: equal chest rise Abdomen: distended abd Gu: foley in place draining medium yellow urine  Lab Results: Recent Labs    09/27/23 0059 09/28/23 0443 09/29/23 0521  HGB 15.4 13.6 12.9*  HCT 44.1 39.9 38.6*   BMET Recent Labs    09/28/23 0443 09/29/23 0521  NA 130* 132*  K 4.0 3.6  CL 100 102  CO2 21* 18*  GLUCOSE 127* 134*  BUN 20 19  CREATININE 1.53* 1.48*  CALCIUM  8.8* 8.6*  HGB 13.6 12.9*  WBC 10.5 9.4     Studies/Results: DG Abd Portable 1V Result Date: 09/29/2023 CLINICAL DATA:  Ileus EXAM: PORTABLE ABDOMEN - 1 VIEW COMPARISON:  None Available. FINDINGS: The bowel gas pattern is  normal. No radio-opaque calculi or other significant radiographic abnormality are seen. IMPRESSION: Negative. Electronically Signed   By: Fredrich Jefferson M.D.   On: 09/29/2023 08:36      LOS: 1 day   Alla Ar, NP [facility] Pager: [phone]  09/29/2023, 11:55 AM

## 2023-09-29 NOTE — Plan of Care (Signed)

## 2023-09-30 ENCOUNTER — Encounter

## 2023-09-30 DIAGNOSIS — N1330 Unspecified hydronephrosis: Secondary | ICD-10-CM

## 2023-09-30 DIAGNOSIS — I10 Essential (primary) hypertension: Secondary | ICD-10-CM

## 2023-09-30 DIAGNOSIS — K219 Gastro-esophageal reflux disease without esophagitis: Secondary | ICD-10-CM

## 2023-09-30 DIAGNOSIS — N179 Acute kidney failure, unspecified: Secondary | ICD-10-CM | POA: Diagnosis not present

## 2023-09-30 DIAGNOSIS — K5900 Constipation, unspecified: Secondary | ICD-10-CM

## 2023-09-30 LAB — CBC
HCT: 38.1 % — ABNORMAL LOW (ref 39.0–52.0)
Hemoglobin: 12.9 g/dL — ABNORMAL LOW (ref 13.0–17.0)
MCH: 30.2 pg (ref 26.0–34.0)
MCHC: 33.9 g/dL (ref 30.0–36.0)
MCV: 89.2 fL (ref 80.0–100.0)
Platelets: 209 10*3/uL (ref 150–400)
RBC: 4.27 MIL/uL (ref 4.22–5.81)
RDW: 13 % (ref 11.5–15.5)
WBC: 10.4 10*3/uL (ref 4.0–10.5)
nRBC: 0 % (ref 0.0–0.2)

## 2023-09-30 LAB — BASIC METABOLIC PANEL WITH GFR
Anion gap: 13 (ref 5–15)
BUN: 19 mg/dL (ref 8–23)
CO2: 20 mmol/L — ABNORMAL LOW (ref 22–32)
Calcium: 8.5 mg/dL — ABNORMAL LOW (ref 8.9–10.3)
Chloride: 97 mmol/L — ABNORMAL LOW (ref 98–111)
Creatinine, Ser: 1.51 mg/dL — ABNORMAL HIGH (ref 0.61–1.24)
GFR, Estimated: 52 mL/min — ABNORMAL LOW (ref >60)
Glucose, Bld: 123 mg/dL — ABNORMAL HIGH (ref 70–99)
Potassium: 3.5 mmol/L (ref 3.5–5.1)
Sodium: 130 mmol/L — ABNORMAL LOW (ref 135–145)

## 2023-09-30 MED ORDER — POTASSIUM CHLORIDE 20 MEQ PO PACK
40.0000 meq | PACK | Freq: Once | ORAL | Status: AC
  Administered 2023-09-30: 40 meq via ORAL
  Filled 2023-09-30: qty 2

## 2023-09-30 MED ORDER — MILK AND MOLASSES ENEMA
1.0000 | Freq: Once | RECTAL | Status: AC
  Administered 2023-09-30: 240 mL via RECTAL
  Filled 2023-09-30: qty 240

## 2023-09-30 MED ORDER — SIMETHICONE 80 MG PO CHEW
160.0000 mg | CHEWABLE_TABLET | Freq: Four times a day (QID) | ORAL | Status: AC
  Administered 2023-09-30 – 2023-10-01 (×4): 160 mg via ORAL
  Filled 2023-09-30 (×4): qty 2

## 2023-09-30 MED ORDER — SIMETHICONE 80 MG PO CHEW
80.0000 mg | CHEWABLE_TABLET | Freq: Four times a day (QID) | ORAL | Status: DC
  Administered 2023-09-30: 80 mg via ORAL
  Filled 2023-09-30 (×2): qty 1

## 2023-09-30 MED ORDER — ACETAMINOPHEN 10 MG/ML IV SOLN
1000.0000 mg | Freq: Four times a day (QID) | INTRAVENOUS | Status: DC
  Administered 2023-09-30: 1000 mg via INTRAVENOUS
  Filled 2023-09-30: qty 100

## 2023-09-30 MED ORDER — ACETAMINOPHEN 325 MG PO TABS
650.0000 mg | ORAL_TABLET | ORAL | Status: DC | PRN
  Administered 2023-09-30 – 2023-10-01 (×6): 650 mg via ORAL
  Filled 2023-09-30 (×6): qty 2

## 2023-09-30 MED ORDER — SODIUM CHLORIDE 0.9 % IV SOLN: INTRAVENOUS | Status: DC

## 2023-09-30 NOTE — Plan of Care (Signed)

## 2023-09-30 NOTE — Progress Notes (Signed)
 [facility]  PROGRESS NOTE  Brandon Riley ZOX:[id number] DOB: 07-30-1960 DOA: 09/26/2023 PCP: Brandon Keels, MD   Brief HPI:   63 y.o. male with medical history significant of GERD, hyperlipidemia, hypertension, OSA on CPAP, restless leg syndrome, steatohepatitis history, osteoarthritis, prediabetes cervical DDD, history of COVID-19, BPH s/p HoLEP on 09/25/23 with Dr. Estanislao Riley 2 days ago was discharged home with POD #0 catheter in place.  He presented to outside emergency department with complaints of lower abdominal pain.  CT imaging: Abdomen was in the prostate fossa, but the patient had mild left hydronephrosis.  There is bilomas deflated and catheter advanced with good urine return.  He was given a prescription for oxybutynin .  However, the patient presented yesterday evening with complaints of continued abdominal pain associated with nausea and constipation.  Case was discussed with urology on-call who recommended admission.  The patient stated that he is now having abdominal distention, has not had a bowel movement or passed flatus since Thursday.  Patient states he is passing gas but still having some abdominal discomfort still.        Assessment/Plan:   S/P HoLEP on 09/25/23 Presenting with:   AKI (acute kidney injury) (HCC) In the setting of:   Hydronephrosis of left kidney -Foley placed by urology as he failed voiding trial - IV fluids - Trend BMP as creatinine improving       Ileus, postoperative (HCC)  S/P HoLEP on 09/25/23 Minimize opioid use. Holding amlodipine  and oxybutynin . Optimize electrolytes. Continue IV hydration. Aggressive bowel regimen and ambulation -Will give milk and molasses enema, continue lactulose, MiraLAX , Senokot tablet, Colace -Abdominal x-ray obtained on 09/29/2023 was negative for obstruction     Obstructive sleep apnea on BiPAP  with periodic limb movement disorder  -Resume BiPAP     GERD (gastroesophageal reflux disease) -PPI.      Steatohepatitis/obesity Weight loss advised.       Hyperlipidemia Hold statin while NPO.     Benign essential hypertension Avoid calcium  channel blockers.     Lumbar degenerative disc disease Analgesics as needed.    Medications     Chlorhexidine  Gluconate Cloth  6 each Topical Daily   docusate sodium   100 mg Oral BID   lactulose  30 g Oral TID   lidocaine   1 Application Urethral Once   metoprolol tartrate  5 mg Intravenous Q12H   polyethylene glycol  17 g Oral Daily   rOPINIRole   3 mg Oral QHS   senna  1 tablet Oral Daily     Data Reviewed:   CBG:  Recent Labs  Lab 09/28/23 1628  GLUCAP 127*    SpO2: 95 % FiO2 (%): 21 %    Vitals:   09/29/23 1939 09/29/23 2255 09/30/23 0139 09/30/23 0422  BP: (!) 147/92   135/83  Pulse: 78   84  Resp: 16 (!) 23  18  Temp: 98.3 F (36.8 C)   98.9 F (37.2 C)  TempSrc:      SpO2: 98%   95%  Weight:   111.3 kg   Height:          Data Reviewed:  Basic Metabolic Panel: Recent Labs  Lab 09/27/23 0059 09/27/23 1043 09/28/23 0443 09/29/23 0521 09/30/23 0527  NA 135 132* 130* 132* 130*  K 3.9 3.8 4.0 3.6 3.5  CL 101 99 100 102 97*  CO2 22 23 21* 18* 20*  GLUCOSE 130* 141* 127* 134* 123*  BUN 23 22 20 19 19   CREATININE  1.59* 1.52* 1.53* 1.48* 1.51*  CALCIUM  9.6 9.1 8.8* 8.6* 8.5*  MG  --  2.0  --   --   --     CBC: Recent Labs  Lab 09/25/23 2209 09/27/23 0059 09/28/23 0443 09/29/23 0521 09/30/23 0527  WBC 11.7* 11.5* 10.5 9.4 10.4  NEUTROABS 10.0* 8.5*  --   --   --   HGB 14.8 15.4 13.6 12.9* 12.9*  HCT 42.4 44.1 39.9 38.6* 38.1*  MCV 86.0 86.6 90.3 89.8 89.2  PLT 199 182 158 173 209    LFT Recent Labs  Lab 09/25/23 2209 09/27/23 1043 09/28/23 0443  AST 25 22 20   ALT 28 20 21   ALKPHOS 76 60 56  BILITOT 1.0 2.0* 2.1*  PROT 6.8 7.1 6.4*  ALBUMIN 3.8 3.6 3.0*     Antibiotics: Anti-infectives (From admission, onward)    None        DVT prophylaxis: SCDs  Code Status: Full  code  Family Communication: Discussed with patient's wife at bedside   CONSULTS urology   Subjective   Continues to have abdominal distention, had 3 loose BMs yesterday after enema.   Objective    Physical Examination:   General-appears in no acute distress Heart-S1-S2, regular, no murmur auscultated Lungs-clear to auscultation bilaterally, no wheezing or crackles auscultated Abdomen-soft, distended, mild generalized tenderness to palpation Extremities-no edema in the lower extremities Neuro-alert, oriented x3, no focal deficit noted   Status is: Inpatient:             Brandon Riley   [facility] If 7PM-7AM, please contact night-coverage at [email or web address], Office  [phone]   09/30/2023, 12:14 PM  LOS: 2 days

## 2023-09-30 NOTE — Progress Notes (Signed)
   09/30/23 2302  BiPAP/CPAP/SIPAP  BiPAP/CPAP/SIPAP Pt Type Adult (patient prefers self placement when ready for bed)  BiPAP/CPAP/SIPAP Resmed  Mask Type Full face mask  Dentures removed? Not applicable  Mask Size Medium  FiO2 (%) 21 %  Patient Home Machine No  Patient Home Mask No  Patient Home Tubing No  Auto Titrate Yes  Minimum cmH2O 4 cmH2O  Maximum cmH2O 20 cmH2O  CPAP/SIPAP surface wiped down Yes  Device Plugged into RED Power Outlet Yes

## 2023-09-30 NOTE — Progress Notes (Signed)
     Subjective: NAEON. Pt felt significantly better this am and has been ambulating. Reports LLQ pain at this time.   Objective: Vital signs in last 24 hours: Temp:  [98.3 F (36.8 C)-98.9 F (37.2 C)] 98.9 F (37.2 C) (06/04 0422) Pulse Rate:  [78-84] 84 (06/04 0422) Resp:  [16-23] 18 (06/04 0422) BP: (135-147)/(83-92) 135/83 (06/04 0422) SpO2:  [95 %-98 %] 95 % (06/04 0422) Weight:  [111.3 kg] 111.3 kg (06/04 0139)  Assessment/Plan: #Abdominal pain #left hydronephrosis #BPH s/p HoLEP #functional ileus   - Catheter removed 6/1 by urology, failed TOV, replaced with 1f coude. - bowel regimen including docusate/senna and bisacodyl suppository. Lot of gas passage following soap suds enema 6/4 - Ambulate 4 times daily - Minimize narcotic use to help with constipation - Continue to trend AKI. Stable today. UOP WNL. Likely 2/2 obstructive uropathy/displaced foley. - Based on his symptoms and imaging he likely has a functional ileus.  From urologic perspective can discharge home as soon as he demonstrates that he can handle oral intake.   Intake/Output this shift: No intake/output data recorded.  Physical Exam:  General: Alert and oriented CV: No cyanosis Lungs: equal chest rise Abdomen: distended abd Gu: foley in place draining medium yellow urine  Lab Results: Recent Labs    09/28/23 0443 09/29/23 0521 09/30/23 0527  HGB 13.6 12.9* 12.9*  HCT 39.9 38.6* 38.1*   BMET Recent Labs    09/29/23 0521 09/30/23 0527  NA 132* 130*  K 3.6 3.5  CL 102 97*  CO2 18* 20*  GLUCOSE 134* 123*  BUN 19 19  CREATININE 1.48* 1.51*  CALCIUM  8.6* 8.5*  HGB 12.9* 12.9*  WBC 9.4 10.4     Studies/Results: DG Abd Portable 1V Result Date: 09/29/2023 CLINICAL DATA:  Ileus EXAM: PORTABLE ABDOMEN - 1 VIEW COMPARISON:  None Available. FINDINGS: The bowel gas pattern is normal. No radio-opaque calculi or other significant radiographic abnormality are seen. IMPRESSION: Negative.  Electronically Signed   By: Fredrich Jefferson M.D.   On: 09/29/2023 08:36      LOS: 2 days   Alla Ar, NP [facility] Pager: [phone]  09/30/2023, 11:23 AM

## 2023-09-30 NOTE — Plan of Care (Signed)
  Problem: Clinical Measurements: Goal: Will remain free from infection Outcome: Progressing Goal: Diagnostic test results will improve Outcome: Progressing   Problem: Nutrition: Goal: Adequate nutrition will be maintained Outcome: Progressing   Problem: Coping: Goal: Level of anxiety will decrease Outcome: Progressing   Problem: Elimination: Goal: Will not experience complications related to bowel motility Outcome: Progressing Goal: Will not experience complications related to urinary retention Outcome: Progressing   Problem: Pain Managment: Goal: General experience of comfort will improve and/or be controlled Outcome: Progressing   Problem: Safety: Goal: Ability to remain free from injury will improve Outcome: Progressing

## 2023-10-01 ENCOUNTER — Other Ambulatory Visit (HOSPITAL_COMMUNITY)

## 2023-10-01 DIAGNOSIS — I10 Essential (primary) hypertension: Secondary | ICD-10-CM | POA: Diagnosis not present

## 2023-10-01 DIAGNOSIS — N179 Acute kidney failure, unspecified: Secondary | ICD-10-CM | POA: Diagnosis not present

## 2023-10-01 DIAGNOSIS — K219 Gastro-esophageal reflux disease without esophagitis: Secondary | ICD-10-CM | POA: Diagnosis not present

## 2023-10-01 DIAGNOSIS — K9189 Other postprocedural complications and disorders of digestive system: Secondary | ICD-10-CM

## 2023-10-01 DIAGNOSIS — K567 Ileus, unspecified: Secondary | ICD-10-CM

## 2023-10-01 LAB — COMPREHENSIVE METABOLIC PANEL WITH GFR
ALT: 18 U/L (ref 0–44)
AST: 15 U/L (ref 15–41)
Albumin: 2.6 g/dL — ABNORMAL LOW (ref 3.5–5.0)
Alkaline Phosphatase: 62 U/L (ref 38–126)
Anion gap: 9 (ref 5–15)
BUN: 19 mg/dL (ref 8–23)
CO2: 20 mmol/L — ABNORMAL LOW (ref 22–32)
Calcium: 8.4 mg/dL — ABNORMAL LOW (ref 8.9–10.3)
Chloride: 100 mmol/L (ref 98–111)
Creatinine, Ser: 1.36 mg/dL — ABNORMAL HIGH (ref 0.61–1.24)
GFR, Estimated: 59 mL/min — ABNORMAL LOW (ref >60)
Glucose, Bld: 116 mg/dL — ABNORMAL HIGH (ref 70–99)
Potassium: 3.7 mmol/L (ref 3.5–5.1)
Sodium: 129 mmol/L — ABNORMAL LOW (ref 135–145)
Total Bilirubin: 1.2 mg/dL (ref 0.0–1.2)
Total Protein: 6.5 g/dL (ref 6.5–8.1)

## 2023-10-01 LAB — CBC
HCT: 36.4 % — ABNORMAL LOW (ref 39.0–52.0)
Hemoglobin: 12.3 g/dL — ABNORMAL LOW (ref 13.0–17.0)
MCH: 30.4 pg (ref 26.0–34.0)
MCHC: 33.8 g/dL (ref 30.0–36.0)
MCV: 90.1 fL (ref 80.0–100.0)
Platelets: 200 10*3/uL (ref 150–400)
RBC: 4.04 MIL/uL — ABNORMAL LOW (ref 4.22–5.81)
RDW: 13 % (ref 11.5–15.5)
WBC: 10.7 10*3/uL — ABNORMAL HIGH (ref 4.0–10.5)
nRBC: 0 % (ref 0.0–0.2)

## 2023-10-01 MED ORDER — SENNA 8.6 MG PO TABS: 2.0000 | ORAL_TABLET | Freq: Every day | ORAL | 0 refills | Status: DC | PRN

## 2023-10-01 MED ORDER — POLYETHYLENE GLYCOL 3350 17 G PO PACK: 17.0000 g | PACK | Freq: Every day | ORAL | 1 refills | Status: DC | PRN

## 2023-10-01 MED ORDER — DOCUSATE SODIUM 100 MG PO CAPS: 100.0000 mg | ORAL_CAPSULE | Freq: Two times a day (BID) | ORAL | 0 refills | Status: AC

## 2023-10-01 MED ORDER — METOPROLOL TARTRATE 25 MG PO TABS
25.0000 mg | ORAL_TABLET | Freq: Two times a day (BID) | ORAL | 3 refills | Status: DC
Start: 2023-10-01 — End: 2024-01-25

## 2023-10-01 MED ORDER — SIMETHICONE 80 MG PO TABS: 80.0000 mg | ORAL_TABLET | Freq: Four times a day (QID) | ORAL | 0 refills | Status: DC

## 2023-10-01 NOTE — Progress Notes (Signed)
     Subjective: Brandon Riley.  Tolerated oral intake without difficulty.  Appetite is still poor  Objective: Vital signs in last 24 hours: Temp:  [97.8 F (36.6 C)-98.5 F (36.9 C)] 97.8 F (36.6 C) (06/05 0439) Pulse Rate:  [77-88] 79 (06/05 0439) Resp:  [18] 18 (06/05 0439) BP: (137-165)/(91-102) 137/91 (06/05 0439) SpO2:  [97 %-100 %] 97 % (06/05 0439) FiO2 (%):  [21 %] 21 % (06/04 2302)  Assessment/Plan: #Abdominal pain #left hydronephrosis #BPH s/p HoLEP #functional ileus   - Catheter removed 6/1 by urology, failed TOV, replaced with 77f coude.  Patient to discharge home with Foley.  TOV scheduled at urology clinic.  - bowel regimen including docusate/senna and bisacodyl suppository. Lot of gas passage following soap suds enema 6/4.  Small BM this morning.    - Ambulate 4 times daily  - Minimize narcotic use to help with constipation  - Continue to trend AKI. Stable today. UOP WNL. Small interval improvement in creatinine now that he is taking in more fluid.   -Functional ileus.  Tolerating oral intake and having bowel movements. Okay to discharge from urologic perspective   Intake/Output this shift: No intake/output data recorded.  Physical Exam:  General: Alert and oriented CV: No cyanosis Lungs: equal chest rise Abdomen: distended abd Gu: foley in place draining medium yellow urine  Lab Results: Recent Labs    09/29/23 0521 09/30/23 0527 10/01/23 0525  HGB 12.9* 12.9* 12.3*  HCT 38.6* 38.1* 36.4*   BMET Recent Labs    09/30/23 0527 10/01/23 0525  NA 130* 129*  K 3.5 3.7  CL 97* 100  CO2 20* 20*  GLUCOSE 123* 116*  BUN 19 19  CREATININE 1.51* 1.36*  CALCIUM  8.5* 8.4*  HGB 12.9* 12.3*  WBC 10.4 10.7*     Studies/Results: No results found.     LOS: 3 days   Alla Ar, NP [facility] Pager: [phone]  10/01/2023, 10:22 AM

## 2023-10-01 NOTE — Discharge Summary (Signed)
 Physician Discharge Summary   Patient: Brandon Riley MRN: [id number] DOB: 1960/09/09  Admit date:     09/26/2023  Discharge date: 10/01/23  Discharge Physician: Ozell Blunt   PCP: Gean Keels, MD   Recommendations at discharge:   Follow-up with urology as outpatient for outpatient voiding trial Patient to be discharged home with Foley catheter Follow-up PCP in 1 week, check BMP in 1 week at PCP office  Discharge Diagnoses: Principal Problem:   AKI (acute kidney injury) (HCC) Active Problems:   Hyperlipidemia   Benign essential hypertension   Lumbar degenerative disc disease   Obstructive sleep apnea on BiPAP with periodic limb movement disorder   GERD (gastroesophageal reflux disease)   Steatohepatitis   Prediabetes   BPH (benign prostatic hyperplasia)   Ileus, postoperative (HCC)   Hydronephrosis of left kidney   Abdominal pain  Resolved Problems:   * No resolved hospital problems. *  Hospital Course: 63 y.o. male with medical history significant of GERD, hyperlipidemia, hypertension, OSA on CPAP, restless leg syndrome, steatohepatitis history, osteoarthritis, prediabetes cervical DDD, history of COVID-19, BPH s/p HoLEP on 09/25/23 with Dr. [name] 2 days ago was discharged home with POD #0 catheter in place.  He presented to outside emergency department with complaints of lower abdominal pain.  CT imaging: Abdomen was in the prostate fossa, but the patient had mild left hydronephrosis.  There is bilomas deflated and catheter advanced with good urine return.  He was given a prescription for oxybutynin .  However, the patient presented yesterday evening with complaints of continued abdominal pain associated with nausea and constipation.  Case was discussed with urology on-call who recommended admission.  The patient stated that he is now having abdominal distention, has not had a bowel movement or passed flatus since Thursday.  Patient states he is passing gas but still  having some abdominal discomfort still.    Assessment and Plan:  S/P HoLEP on 09/25/23 Presenting with:   AKI (acute kidney injury) (HCC) In the setting of:   Hydronephrosis of left kidney -Foley placed by urology as he failed voiding trial - Patient to be discharged with Foley catheter -Follow-up urology as outpatient for voiding trial -Renal function is improving, will check BMP in 1 week at PCP office       Ileus, postoperative (HCC)  -Significant improved, had multiple bowel movements in past 24 hours S/P HoLEP on 09/25/23 -Likely in setting of opioid use due to pain -Amlodipine  and oxybutynin  have been discontinued -Required aggressive bowel regimen including fleets enema, milk and molasses enema -Lactulose, MiraLAX , Senokot tablets, Colace -Abdominal x-ray obtained on 09/29/2023 was negative for obstruction -Will discharge on MiraLAX  daily as needed, Senokot 2 tablets daily as needed, Colace 100 mg p.o. twice daily -Simethicone 80 mg 4 times daily for 5 days     Obstructive sleep apnea on BiPAP  with periodic limb movement disorder  -Resume BiPAP      GERD (gastroesophageal reflux disease) -PPI.     Steatohepatitis/obesity Weight loss advised.       Hyperlipidemia Continue statin     Benign essential hypertension Amlodipine  has been discontinued due to constipation -Will start on metoprolol 25 mg p.o. twice daily     Lumbar degenerative disc disease Analgesics as needed. Minimize opioids  Hyponatremia -Mild, sodium 129 -Mild, likely in setting of poor p.o. 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)  Disposition: Home Diet recommendation:  Discharge Diet Orders (From admission, onward)     Start     Ordered   10/01/23 0000  Diet - low sodium heart healthy        10/01/23 1439           Regular diet DISCHARGE  MEDICATION: Allergies as of 10/01/2023       Reactions   Celebrex [celecoxib] Palpitations   Heavy breathing   Flexeril [cyclobenzaprine] Hives   Iodinated Contrast Media Rash   Rash for 3 days on arm where IV was placed and then next day it spread to the body        Medication List     STOP taking these medications    amLODipine  5 MG tablet Commonly known as: NORVASC    bisacodyl 5 MG EC tablet Commonly known as: DULCOLAX   methylPREDNISolone  4 MG Tbpk tablet Commonly known as: MEDROL  DOSEPAK   oxybutynin  10 MG 24 hr tablet Commonly known as: Ditropan  XL   traMADol  50 MG tablet Commonly known as: Ultram        TAKE these medications    acetaminophen  500 MG tablet Commonly known as: TYLENOL  Take 500-1,000 mg by mouth every 6 (six) hours as needed for mild pain (pain score 1-3) or moderate pain (pain score 4-6).   ALPRAZolam  0.5 MG tablet Commonly known as: XANAX  Take 1 tablet (0.5 mg total) by mouth 2 (two) times daily as needed for anxiety. What changed: when to take this   docusate sodium  100 MG capsule Commonly known as: COLACE Take 1 capsule (100 mg total) by mouth 2 (two) times daily for 10 days.   metoprolol tartrate 25 MG tablet Commonly known as: LOPRESSOR Take 1 tablet (25 mg total) by mouth 2 (two) times daily.   MILK THISTLE EXTRACT PO Take 1 capsule by mouth daily.   oxyCODONE  5 MG immediate release tablet Commonly known as: Roxicodone  Take 1 tablet (5 mg total) by mouth every 6 (six) hours as needed for up to 10 doses.   polyethylene glycol 17 g packet Commonly known as: MIRALAX  / GLYCOLAX  Take 17 g by mouth daily as needed for up to 28 days for moderate constipation.   rOPINIRole  1 MG tablet Commonly known as: REQUIP  1-4 tabs PO qHS What changed:  how much to take how to take this when to take this additional instructions   rosuvastatin  10 MG tablet Commonly known as: CRESTOR  TAKE ONE TABLET BY MOUTH DAILY What changed: when to  take this   senna 8.6 MG Tabs tablet Commonly known as: SENOKOT Take 2 tablets (17.2 mg total) by mouth daily as needed for moderate constipation.   Simethicone 80 MG Tabs Take 1 tablet (80 mg total) by mouth 4 (four) times daily for 5 days.        Follow-up Information     Gean Keels, MD Follow up in 1 week(s).   Specialties: Family Medicine, Sports Medicine, Radiology Why: Check BMP at PCP office in 1 week Contact information: [address] [place] [address] [phone]         [facility]. Schedule an appointment as soon as possible for a visit.   Contact information: [address] [facility] [place]  [address] [phone]               Discharge Exam: Cleavon Curls Weights   09/27/23 0022 09/30/23 0139  Weight: 105.7 kg 111.3 kg  General-appears in no acute distress Heart-S1-S2, regular, no murmur auscultated Lungs-clear to auscultation bilaterally, no wheezing or crackles auscultated Abdomen-soft, nontender, no organomegaly Extremities-no edema in the lower extremities Neuro-alert, oriented x3, no focal deficit noted  Condition at discharge: good  The results of significant diagnostics from this hospitalization (including imaging, microbiology, ancillary and laboratory) are listed below for reference.   Imaging Studies: DG Abd Portable 1V Result Date: 09/29/2023 CLINICAL DATA:  Ileus EXAM: PORTABLE ABDOMEN - 1 VIEW COMPARISON:  None Available. FINDINGS: The bowel gas pattern is normal. No radio-opaque calculi or other significant radiographic abnormality are seen. IMPRESSION: Negative. Electronically Signed   By: Fredrich Jefferson M.D.   On: 09/29/2023 08:36   CT ABDOMEN PELVIS WO CONTRAST Result Date: 09/27/2023 CLINICAL DATA:  Acute nonlocalized abdominal pain, obstipation EXAM: CT ABDOMEN AND PELVIS WITHOUT CONTRAST TECHNIQUE: Multidetector CT imaging of the abdomen and pelvis was performed following the  standard protocol without IV contrast. RADIATION DOSE REDUCTION: This exam was performed according to the departmental dose-optimization program which includes automated exposure control, adjustment of the mA and/or kV according to patient size and/or use of iterative reconstruction technique. COMPARISON:  None Available. FINDINGS: Lower chest: No acute abnormality.  Bibasilar atelectasis Hepatobiliary: Mild hepatic steatosis. No definite intrahepatic mass. No intra or extrahepatic biliary ductal dilation. Gallbladder unremarkable. Pancreas: Un remark Spleen: Unremarkable Adrenals/Urinary Tract: The adrenal glands are unremarkable. The kidneys are normal in size and position. Simple cortical cyst arises from the lower pole the right kidney for which no follow-up imaging is recommended. Exophytic lesion arises from the interpolar region of the left kidney, better characterized as a simple cortical cyst on prior examination. No follow-up imaging is recommended for these lesions. Stable mild left hydronephrosis and hydroureter to the level of the left ureterovesicular junction. Left perinephric stranding has improved slightly in the interval since prior examination. Mild bilateral perinephric stranding persists. Foley catheter has been repositioned with its retaining balloon now seen within the bladder lumen with complete decompression of the bladder. Stomach/Bowel: Stomach is within normal limits. Appendix appears normal. No evidence of bowel wall thickening, distention, or inflammatory changes. Vascular/Lymphatic: No significant vascular findings are present. No enlarged abdominal or pelvic lymph nodes. Reproductive: Central prostate gland appears edematous the prostate gland itself is mildly enlarged. No periprostatic inflammatory stranding or fluid collection identified. Other: Tiny fat containing umbilical and bilateral inguinal hernias. Musculoskeletal: No acute bone abnormality. No lytic or blastic bone lesion.  Osseous structures are age appropriate. IMPRESSION: 1. Stable mild left hydronephrosis and hydroureter to the level of the left ureterovesicular junction. No obstructing calculus identified. Findings may relate to an intrinsic stricture and may be better assessed with ureteroscopy or CT urography 2. Improving left perinephric inflammatory stranding. 3. Interval repositioning of a Foley catheter with its retaining balloon now seen within the bladder lumen. 4. Mild hepatic steatosis. Electronically Signed   By: Worthy Heads M.D.   On: 09/27/2023 01:44   CT ABDOMEN PELVIS WO CONTRAST Result Date: 09/25/2023 CLINICAL DATA:  Acute nonlocalized abdominal pain. Left-sided abdominal pain. Nausea. EXAM: CT ABDOMEN AND PELVIS WITHOUT CONTRAST TECHNIQUE: Multidetector CT imaging of the abdomen and pelvis was performed following the standard protocol without IV contrast. RADIATION DOSE REDUCTION: This exam was performed according to the departmental dose-optimization program which includes automated exposure control, adjustment of the mA and/or kV according to patient size and/or use of iterative reconstruction technique. COMPARISON:  09/15/2023 FINDINGS: Lower chest: Linear atelectasis or scarring in the lung bases. Hepatobiliary: Mild  diffuse fatty infiltration of the liver. No focal lesions. Gallbladder and bile ducts are normal. Pancreas: Unremarkable. No pancreatic ductal dilatation or surrounding inflammatory changes. Spleen: Normal in size without focal abnormality. Adrenals/Urinary Tract: No adrenal gland nodules. Prominent stranding around the kidneys, greater on the left. No loculated collections. Mild bilateral hydronephrosis without hydroureter. No ureteral stones are demonstrated. Bilateral renal cysts, greater on the right measuring 4.5 cm diameter. No significant change since prior study. No additional imaging follow-up is indicated. Bladder wall is thickened. Gas in the bladder consistent with  instrumentation. A Foley catheter is present. The balloon appears to be within the prostatic urethra. Small calcifications adjacent to the catheter likely represent prostate calcifications. Stomach/Bowel: Stomach is within normal limits. Appendix appears normal. No evidence of bowel wall thickening, distention, or inflammatory changes. Vascular/Lymphatic: No significant vascular findings are present. No enlarged abdominal or pelvic lymph nodes. Reproductive: Prostate gland is diffusely enlarged. Other: No free air or free fluid in the abdomen. Abdominal wall musculature appears intact. Musculoskeletal: Degenerative changes in the spine. No acute bony abnormalities. IMPRESSION: 1. Bladder wall thickening may indicate outlet obstruction or cystitis. Correlate with urinalysis. 2. Prostatic enlargement. Foley catheter is present with retention balloon likely in the prostatic urethra. 3. Bilateral renal hydronephrosis without obstructing stone. This may be due to reflux, stasis, or infection. Stranding around the kidneys in ureters may indicate infection. 4. Mild fatty infiltration of the liver. 5. Scarring or atelectasis in the lung bases. Electronically Signed   By: Boyce Byes M.D.   On: 09/25/2023 23:20   CT CHEST ABDOMEN PELVIS W CONTRAST Result Date: 09/15/2023 CLINICAL DATA:  Metastatic disease evaluation, potentially malignant lesion of the sacrum incidentally identified by prior lumbar MR * Tracking Code: BO * EXAM: CT CHEST, ABDOMEN, AND PELVIS WITH CONTRAST TECHNIQUE: Multidetector CT imaging of the chest, abdomen and pelvis was performed following the standard protocol during bolus administration of intravenous contrast. RADIATION DOSE REDUCTION: This exam was performed according to the departmental dose-optimization program which includes automated exposure control, adjustment of the mA and/or kV according to patient size and/or use of iterative reconstruction technique. CONTRAST:  OMNIPAQUE   IOHEXOL  300 MG/ML  SOLN COMPARISON:  MR sacrum, 08/11/2023 FINDINGS: CT CHEST FINDINGS Cardiovascular: No significant vascular findings. Normal heart size. No pericardial effusion. Mediastinum/Nodes: No enlarged mediastinal, hilar, or axillary lymph nodes. Thyroid  gland, trachea, and esophagus demonstrate no significant findings. Lungs/Pleura: Mild, bandlike scarring of the left lung base. Mild, diffuse bilateral bronchial wall thickening and fine centrilobular nodularity throughout the lungs as well as multiple discrete nodules, for example a 0.4 cm nodule in the right middle lobe (series 3, image 85), a 0.7 cm fissural nodule in the right lower lobe (series 3, image 98), and a 0.3 cm nodule in the posterior left upper lobe (series 3, image 47). No pleural effusion or pneumothorax. Musculoskeletal: No chest wall abnormality. No acute osseous findings. CT ABDOMEN PELVIS FINDINGS Hepatobiliary: No solid liver abnormality is seen. Hepatic steatosis. No gallstones, gallbladder wall thickening, or biliary dilatation. Pancreas: Unremarkable. No pancreatic ductal dilatation or surrounding inflammatory changes. Spleen: Normal in size without significant abnormality. Adrenals/Urinary Tract: Adrenal glands are unremarkable. Simple, benign bilateral renal cortical cysts, for which no further follow-up or characterization is required. Kidneys are otherwise normal, without renal calculi, solid lesion, or hydronephrosis. Thickening of the urinary bladder wall, decompressed by Foley catheter. Stomach/Bowel: Stomach is within normal limits. Appendix appears normal. No evidence of bowel wall thickening, distention, or inflammatory changes. Vascular/Lymphatic: No significant  vascular findings are present. Prominent bilateral pelvic sidewall and iliac lymph nodes, left pelvic sidewall lymph nodes measuring up to 2.0 x 0.7 cm (series 2, image 111). Reproductive: Severe prostatomegaly. Other: No abdominal wall hernia or abnormality. No  ascites. Musculoskeletal: No acute osseous findings. Contrast enhancing osseous lesion in the right hemisacrum has a very subtle sclerotic CT correlate (series 2, image 97). No other osseous lesions are appreciated by CT, noting that the CT sensitivity for additional lesions of this character is most likely very limited. Disc degenerative disease and bridging osteophytosis throughout the thoracic and lumbar spine, in keeping with DISH IMPRESSION: 1. Contrast enhancing osseous lesion in the right hemisacrum has a very subtle sclerotic CT correlate. No other osseous lesions are appreciated by CT, noting that the CT sensitivity for additional lesions of this character is most likely very limited. Consider nuclear scintigraphic whole-body bone scan or PET-CT for more sensitive assessment. 2. Severe prostatomegaly. 3. Prominent bilateral pelvic sidewall and iliac lymph nodes. 4. Above constellation of findings is highly suspicious for prostate malignancy with associated metastatic disease. 5. Mild, diffuse bilateral bronchial wall thickening and fine centrilobular nodularity throughout the lungs as well as multiple discrete nodules, measuring up to 0.7 cm. Background findings are most commonly seen in smoking-related respiratory bronchiolitis although generally infectious or inflammatory. Discrete nodules are nonspecific, small metastases not excluded. Attention on follow-up. 6. Hepatic steatosis. Electronically Signed   By: Fredricka Jenny M.D.   On: 09/15/2023 11:47    Microbiology: Results for orders placed or performed in visit on 08/27/23  CULTURE, URINE COMPREHENSIVE     Status: Abnormal   Collection Time: 08/27/23 11:51 AM   Specimen: Urine   UR  Result Value Ref Range Status   Urine Culture, Comprehensive Final report (A)  Final   Organism ID, Bacteria Comment (A)  Final    Comment: Staphylococcus epidermidis Based on resistance to oxacillin this isolate would be resistant to all currently available  beta-lactam antimicrobial agents, with the exception of the newer cephalosporins with anti-MRSA activity, such as Ceftaroline 2,000 Colonies/mL    ANTIMICROBIAL SUSCEPTIBILITY Comment  Final    Comment:       ** S = Susceptible; I = Intermediate; R = Resistant **                    P = Positive; N = Negative             MICS are expressed in micrograms per mL    Antibiotic                 RSLT#1    RSLT#2    RSLT#3    RSLT#4 Ciprofloxacin                   S Gentamicin                     I Levofloxacin                   S Linezolid                      S Moxifloxacin                   S Nitrofurantoin                 S Oxacillin  R Penicillin                     R Rifampin                       S Tetracycline                   R Trimethoprim /Sulfa              S Vancomycin                     S   Microscopic Examination     Status: Abnormal   Collection Time: 08/27/23 11:51 AM   Urine  Result Value Ref Range Status   WBC, UA 0-5 0 - 5 /hpf Final   RBC, Urine >30 (A) 0 - 2 /hpf Final   Epithelial Cells (non renal) 0-10 0 - 10 /hpf Final   Casts Present (A) None seen /lpf Final   Cast Type Hyaline casts N/A Final   Crystals Present (A) N/A Final   Crystal Type Amorphous Sediment N/A Final   Mucus, UA Present (A) Not Estab. Final   Bacteria, UA Few None seen/Few Final    Labs: CBC: Recent Labs  Lab 09/25/23 2209 09/27/23 0059 09/28/23 0443 09/29/23 0521 09/30/23 0527 10/01/23 0525  WBC 11.7* 11.5* 10.5 9.4 10.4 10.7*  NEUTROABS 10.0* 8.5*  --   --   --   --   HGB 14.8 15.4 13.6 12.9* 12.9* 12.3*  HCT 42.4 44.1 39.9 38.6* 38.1* 36.4*  MCV 86.0 86.6 90.3 89.8 89.2 90.1  PLT 199 182 158 173 209 200   Basic Metabolic Panel: Recent Labs  Lab 09/27/23 1043 09/28/23 0443 09/29/23 0521 09/30/23 0527 10/01/23 0525  NA 132* 130* 132* 130* 129*  K 3.8 4.0 3.6 3.5 3.7  CL 99 100 102 97* 100  CO2 23 21* 18* 20* 20*  GLUCOSE 141* 127* 134* 123*  116*  BUN 22 20 19 19 19   CREATININE 1.52* 1.53* 1.48* 1.51* 1.36*  CALCIUM  9.1 8.8* 8.6* 8.5* 8.4*  MG 2.0  --   --   --   --    Liver Function Tests: Recent Labs  Lab 09/25/23 2209 09/27/23 1043 09/28/23 0443 10/01/23 0525  AST 25 22 20 15   ALT 28 20 21 18   ALKPHOS 76 60 56 62  BILITOT 1.0 2.0* 2.1* 1.2  PROT 6.8 7.1 6.4* 6.5  ALBUMIN 3.8 3.6 3.0* 2.6*   CBG: Recent Labs  Lab 09/28/23 1628  GLUCAP 127*    Discharge time spent: greater than 30 minutes.  Signed: Ozell Blunt, MD [facility] 10/01/2023

## 2023-10-01 NOTE — Plan of Care (Signed)

## 2023-10-02 ENCOUNTER — Other Ambulatory Visit: Payer: Self-pay | Admitting: Urology

## 2023-10-02 ENCOUNTER — Telehealth: Payer: Self-pay

## 2023-10-02 ENCOUNTER — Other Ambulatory Visit (HOSPITAL_COMMUNITY)

## 2023-10-02 ENCOUNTER — Ambulatory Visit: Admitting: Rehabilitative and Restorative Service Providers"

## 2023-10-02 NOTE — Transitions of Care (Post Inpatient/ED Visit) (Signed)
 10/02/2023  Name: Brandon Riley MRN: [id number] DOB: March 03, 1961  Today's TOC FU Call Status: Today's TOC FU Call Status:: Successful TOC FU Call Completed TOC FU Call Complete Date: 10/02/23 Patient's Name and Date of Birth confirmed.  Transition Care Management Follow-up Telephone Call Date of Discharge: 10/01/23 Discharge Facility: [facility]) Type of Discharge: Inpatient Admission Primary Inpatient Discharge Diagnosis:: AKF How have you been since you were released from the hospital?: Better Any questions or concerns?: No  Items Reviewed: Did you receive and understand the discharge instructions provided?: Yes Medications obtained,verified, and reconciled?: Yes (Medications Reviewed) Any new allergies since your discharge?: No Dietary orders reviewed?: Yes Do you have support at home?: Yes People in Home [RPT]: spouse  Medications Reviewed Today: Medications Reviewed Today     Reviewed by Darrall Ellison, LPN (Licensed Practical Nurse) on 10/02/23 at 0919  Med List Status: <None>   Medication Order Taking? Sig Documenting Provider Last Dose Status Informant  acetaminophen  (TYLENOL ) 500 MG tablet [id number] No Take 500-1,000 mg by mouth every 6 (six) hours as needed for mild pain (pain score 1-3) or moderate pain (pain score 4-6). [provider] 09/26/2023 Active Self, Pharmacy Records  ALPRAZolam  (XANAX ) 0.5 MG tablet [id number] No Take 1 tablet (0.5 mg total) by mouth 2 (two) times daily as needed for anxiety.  Patient taking differently: Take 0.5 mg by mouth daily as needed for anxiety.   Gean Keels, MD Not Taking Active Self, Pharmacy Records           Med Note (CRUTHIS, Wash Hack Sep 27, 2023  8:37 AM) Has at home but has never taken   docusate sodium  (COLACE) 100 MG capsule [id number]  Take 1 capsule (100 mg total) by mouth 2 (two) times daily for 10 days. Ozell Blunt, MD  Active   metoprolol tartrate (LOPRESSOR) 25 MG tablet [id number]  Take 1  tablet (25 mg total) by mouth 2 (two) times daily. Ozell Blunt, MD  Active   MILK THISTLE EXTRACT PO [id number] No Take 1 capsule by mouth daily. [provider] 09/18/2023 Active Self, Pharmacy Records  oxyCODONE  (ROXICODONE ) 5 MG immediate release tablet [id number] No Take 1 tablet (5 mg total) by mouth every 6 (six) hours as needed for up to 10 doses. Lowery Rue, DO 09/26/2023 Active Self, Pharmacy Records  polyethylene glycol (MIRALAX  / GLYCOLAX ) 17 g packet [id number]  Take 17 g by mouth daily as needed for up to 28 days for moderate constipation. Ozell Blunt, MD  Active   rOPINIRole  (REQUIP ) 1 MG tablet [id number] No 1-4 tabs PO qHS  Patient taking differently: Take 3 mg by mouth at bedtime.   Gean Keels, MD 09/24/2023 Active Self, Pharmacy Records  rosuvastatin  (CRESTOR ) 10 MG tablet [id number] No TAKE ONE TABLET BY MOUTH DAILY  Patient taking differently: Take 10 mg by mouth every Monday, Wednesday, and Friday.   Gean Keels, MD 09/23/2023 Active Self, Pharmacy Records  senna (SENOKOT) 8.6 MG TABS tablet [id number]  Take 2 tablets (17.2 mg total) by mouth daily as needed for moderate constipation. Ozell Blunt, MD  Active   Simethicone 80 MG TABS [id number]  Take 1 tablet (80 mg total) by mouth 4 (four) times daily for 5 days. Ozell Blunt, MD  Active             Home Care and Equipment/Supplies: Were Home Health Services Ordered?: NA Any new equipment or medical supplies ordered?: NA  Functional Questionnaire: Do you need assistance with bathing/showering or dressing?: No Do you need assistance with meal preparation?: No Do you need assistance with eating?: No Do you have difficulty maintaining continence: No Do you need assistance with getting out of bed/getting out of a chair/moving?: No Do you have difficulty managing or taking your medications?: No  Follow up appointments reviewed: PCP Follow-up appointment confirmed?: Yes Date of PCP  follow-up appointment?: 10/07/23 Follow-up Provider: [facility] Follow-up appointment confirmed?: No Reason Specialist Follow-Up Not Confirmed: Patient has Specialist Provider Number and will Call for Appointment Do you need transportation to your follow-up appointment?: No Do you understand care options if your condition(s) worsen?: Yes-patient verbalized understanding    SIGNATURE Darrall Ellison, LPN [facility] Nurse Health Advisor Direct Dial [phone]

## 2023-10-05 ENCOUNTER — Telehealth: Payer: Self-pay | Admitting: Rehabilitative and Restorative Service Providers"

## 2023-10-05 NOTE — Telephone Encounter (Signed)
 Patient called today (returned call from Friday). We discussed options for physical therapy -- recommending we keep 1 more visit to check in, update home program, and take knee measurements.  Plan after that to hold off on further visits until he gets urology issues worked out. He reports he wants to hold off on oncology testing x 2 months and plans to discuss this further with his medical doctor.   Babita Amaker, PT

## 2023-10-06 ENCOUNTER — Telehealth (HOSPITAL_COMMUNITY): Payer: Self-pay | Admitting: Sports Medicine

## 2023-10-06 ENCOUNTER — Encounter

## 2023-10-06 NOTE — Telephone Encounter (Signed)
 I called and spoke with the patient to reschedule his PET scan that was ordered and he declined to schedule at this time.  Per provider ok to cancel order for now.

## 2023-10-07 ENCOUNTER — Inpatient Hospital Stay: Admitting: Sports Medicine

## 2023-10-07 ENCOUNTER — Telehealth: Payer: Self-pay | Admitting: *Deleted

## 2023-10-07 ENCOUNTER — Encounter: Payer: Self-pay | Admitting: Urology

## 2023-10-07 ENCOUNTER — Ambulatory Visit: Admitting: Urology

## 2023-10-07 ENCOUNTER — Telehealth: Payer: Self-pay

## 2023-10-07 VITALS — BP 147/89 | HR 85

## 2023-10-07 DIAGNOSIS — N179 Acute kidney failure, unspecified: Secondary | ICD-10-CM

## 2023-10-07 DIAGNOSIS — R339 Retention of urine, unspecified: Secondary | ICD-10-CM

## 2023-10-07 DIAGNOSIS — N138 Other obstructive and reflux uropathy: Secondary | ICD-10-CM

## 2023-10-07 DIAGNOSIS — N401 Enlarged prostate with lower urinary tract symptoms: Secondary | ICD-10-CM

## 2023-10-07 DIAGNOSIS — R338 Other retention of urine: Secondary | ICD-10-CM

## 2023-10-07 MED ORDER — PHENAZOPYRIDINE HCL 200 MG PO TABS
200.0000 mg | ORAL_TABLET | Freq: Three times a day (TID) | ORAL | 0 refills | Status: DC | PRN
Start: 2023-10-07 — End: 2023-11-03

## 2023-10-07 MED ORDER — CIPROFLOXACIN HCL 500 MG PO TABS
500.0000 mg | ORAL_TABLET | Freq: Once | ORAL | Status: AC
Start: 2023-10-07 — End: 2023-10-07
  Administered 2023-10-07: 500 mg via ORAL

## 2023-10-07 NOTE — Progress Notes (Signed)
 Complex Care Management Note  Care Guide Note 10/07/2023 Name: [name] MRN: [id number] DOB: 07/01/1960  Brandon Riley is a 63 y.o. year old male who sees Thekkekandam, Joselyn Nicely, MD for primary care. I reached out to Brandon Riley by phone today to offer complex care management services.  [name] was given information about Complex Care Management services today including:   The Complex Care Management services include support from the care team which includes your Nurse Care Manager, Clinical Social Worker, or Pharmacist.  The Complex Care Management team is here to help remove barriers to the health concerns and goals most important to you. Complex Care Management services are voluntary, and the patient may decline or stop services at any time by request to their care team member.   Complex Care Management Consent Status: Patient agreed to services and verbal consent obtained.   Follow up plan:  Telephone appointment with complex care management team member scheduled for:  10/12/2023  Encounter Outcome:  Patient Scheduled  Kandis Ormond, CMA [place]  [facility], [facility] Guide Direct Dial: [phone]  Fax: [phone] Website: [email or web address]

## 2023-10-07 NOTE — Progress Notes (Signed)
 Assessment: 1. Benign prostatic hyperplasia with urinary obstruction   2. Urinary retention     Plan: Foley removed today following successful voiding trial. Cipro  x 1 following foley removal. Return to office in 7-10 days with bladder scan  Chief Complaint:  Chief Complaint  Patient presents with   Benign Prostatic Hypertrophy    History of Present Illness:  Brandon Riley is a 63 y.o. male who is seen for continued evaluation of urinary retention. He reported no problems with urination prior to his surgery.  He previously took tamsulosin  for a short period of time due to increased urinary symptoms.  However, he had discontinued this prior to his recent episode. He underwent a knee arthroplasty on 07/20/2023 and developed postoperative urinary retention.  There was an attempt at spinal anesthesia. He had a Foley catheter placed and was discharged home with a catheter and on tamsulosin .  He presented to [facility] ER on 3/27 with problems with the Foley catheter drainage.  He was found to have some small clots in the catheter.  His Foley was removed at [facility] on 07/30/2023.  He was unable to void following removal and return to the emergency room later that evening.  A Foley catheter was replaced on 07/31/2023 with return of 2000 mL of urine.  He continued on tamsulosin  0.8 mg daily.  He is having regular bowel movements.  No lower extremity weakness or numbness. 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  He has a history of an elevated PSA. PSA results: 10/21 1.2 5/22 6.0  23% free 6/22 6.5  28% free 3/23 5.0  32% free 6/23 4.9  41% free 6/24 4.8  31% free 1/25 5.5  28.5% free  He underwent a prostate biopsy in October 2022 by Dr. Elidia Grout with [facility].  Pathology showed benign prostate tissue with  atrophy and associated mixed inflammation. Prostate volume:  125.9 g  His Foley catheter was removed on 08/10/2023 after a successful voiding trial in the office.  Unfortunately, he had difficulty voiding later that day and return to the emergency room where a Foley catheter was placed.  He was changed to silodosin  at that time.   Cystoscopy from 08/20/2023 showed lateral lobe enlargement of the prostate. He was unable to void later that day and required placement of a Foley catheter in the emergency room.  He has undergone a HoLEP by Dr. [name] on 09/25/2023.  A total of 105 g of prostate tissue was removed.  Path showed BPH. He was admitted to the hospital on 09/26/2023 for abdominal pain and AKI.  Evaluation determined that the Foley catheter had pulled into the prostatic fossa.  He was also found to have mild left hydronephrosis.  He was admitted to the hospital for management of his abdominal pain with associated nausea and constipation.  His renal function gradually improved.  His constipation was aggressively managed.  He was discharged on 10/01/2023.  He presents today for voiding trial.  Portions of the above documentation were copied from a prior visit for review purposes only.   Past Medical History:  Past Medical History:  Diagnosis Date   Arthritis    BPH (benign prostatic hyperplasia)    Complication of anesthesia    epidural did not work ended up with general and now trouble w/ urinatiing needing laser enucleation  of prastate   GERD (gastroesophageal reflux disease)    Hyperlipemia    Hypertension    OSA on CPAP    Restless leg syndrome     Past Surgical History:  Past Surgical History:  Procedure Laterality Date   CHONDROPLASTY Left 10/28/2018   Procedure: CHONDROPLASTY;  Surgeon: Micheline Ahr, MD;  Location: [facility];  Service: Orthopedics;  Laterality: Left;   HOLEP-LASER ENUCLEATION OF THE PROSTATE WITH MORCELLATION N/A 09/25/2023   Procedure:  ENUCLEATION, PROSTATE, USING LASER, WITH MORCELLATION;  Surgeon: Lawerence Pressman, MD;  Location: [facility];  Service: Urology;  Laterality: N/A;   KNEE ARTHROSCOPY WITH MEDIAL MENISECTOMY Left 10/28/2018   Procedure: LEFT KNEE ARTHROSCOPY WITH MEDIAL MENISECTOMY;  Surgeon: Micheline Ahr, MD;  Location: [facility];  Service: Orthopedics;  Laterality: Left;   RECONSTRUCTION OF NOSE  2019   TOTAL KNEE ARTHROPLASTY Left 07/20/2023   Procedure: ARTHROPLASTY, KNEE, TOTAL;  Surgeon: Murleen Arms, MD;  Location: [facility];  Service: Orthopedics;  Laterality: Left;   WISDOM TOOTH EXTRACTION  2012    Allergies:  Allergies  Allergen Reactions   Celebrex [Celecoxib] Palpitations    Heavy breathing   Flexeril [Cyclobenzaprine] Hives   Iodinated Contrast Media Rash    Rash for 3 days on arm where IV was placed and then next day it spread to the body    Family History:  Family History  Problem Relation Age of Onset   Dementia Mother    Heart Problems Mother        Double bypass   Diabetes Mother    Other Father        Prostate Issues    Social History:  Social History   Tobacco Use   Smoking status: Never    Passive exposure: Never   Smokeless tobacco: Never  Vaping Use   Vaping status: Never Used  Substance Use Topics   Alcohol  use: Not Currently    Comment: 2-3x per week   Drug use: Never    ROS: Constitutional:  Negative for fever, chills, weight loss CV: Negative for chest pain, previous MI, hypertension Respiratory:  Negative for shortness of breath, wheezing, sleep apnea, frequent cough GI:  Negative for nausea, vomiting, bloody stool, GERD  Physical exam: BP (!) 147/89   Pulse 85  GENERAL APPEARANCE:  Well appearing, well developed, well nourished, NAD HEENT:  Atraumatic, normocephalic, oropharynx clear NECK:  Supple without lymphadenopathy or thyromegaly ABDOMEN:  Soft, non-tender, no masses EXTREMITIES:  Moves all extremities well, without  clubbing, cyanosis, or edema NEUROLOGIC:  Alert and oriented x 3, normal gait, CN II-XII grossly intact MENTAL STATUS:  appropriate BACK:  Non-tender to palpation, No CVAT SKIN:  Warm, dry, and intact  Results: None  Procedure:  VOIDING TRIAL  A voiding trial was performed in the office today.   Volume of sterile water  instilled: 300 mL Foley catheter removed intact. Volume voided by patient: 350 mL Instructed to return to office if has not voided by 4 PM

## 2023-10-07 NOTE — Progress Notes (Signed)
 Fill and Pull Catheter Removal  Patient is present today for a catheter removal.  Patient was cleaned and prepped in a sterile fashion 300mL of sterile water / saline was instilled into the bladder when the patient felt the urge to urinate, 9mL of water  was then drained from the balloon.  A 18FR coude foley cath was removed from the bladder no complications were noted .  Patient was then given some time to void on their own.  Patient can void  on their own after some time.  Patient tolerated well.  Performed by: Aliya Sol CMA

## 2023-10-08 ENCOUNTER — Ambulatory Visit: Attending: Orthopedic Surgery

## 2023-10-08 ENCOUNTER — Encounter: Payer: Self-pay | Admitting: Urology

## 2023-10-08 ENCOUNTER — Ambulatory Visit (INDEPENDENT_AMBULATORY_CARE_PROVIDER_SITE_OTHER): Admitting: Sports Medicine

## 2023-10-08 ENCOUNTER — Ambulatory Visit

## 2023-10-08 VITALS — BP 120/74 | HR 78 | Temp 97.4°F | Resp 20

## 2023-10-08 DIAGNOSIS — K9189 Other postprocedural complications and disorders of digestive system: Secondary | ICD-10-CM

## 2023-10-08 DIAGNOSIS — R2689 Other abnormalities of gait and mobility: Secondary | ICD-10-CM | POA: Insufficient documentation

## 2023-10-08 DIAGNOSIS — M6281 Muscle weakness (generalized): Secondary | ICD-10-CM | POA: Diagnosis not present

## 2023-10-08 DIAGNOSIS — N179 Acute kidney failure, unspecified: Secondary | ICD-10-CM

## 2023-10-08 DIAGNOSIS — M533 Sacrococcygeal disorders, not elsewhere classified: Secondary | ICD-10-CM | POA: Diagnosis not present

## 2023-10-08 DIAGNOSIS — M25562 Pain in left knee: Secondary | ICD-10-CM | POA: Insufficient documentation

## 2023-10-08 DIAGNOSIS — R140 Abdominal distension (gaseous): Secondary | ICD-10-CM | POA: Diagnosis not present

## 2023-10-08 DIAGNOSIS — K567 Ileus, unspecified: Secondary | ICD-10-CM | POA: Diagnosis not present

## 2023-10-08 DIAGNOSIS — J90 Pleural effusion, not elsewhere classified: Secondary | ICD-10-CM | POA: Diagnosis not present

## 2023-10-08 MED ORDER — SIMETHICONE 180 MG PO CAPS
1.0000 | ORAL_CAPSULE | Freq: Four times a day (QID) | ORAL | 0 refills | Status: DC | PRN
Start: 2023-10-08 — End: 2023-10-16

## 2023-10-08 NOTE — Assessment & Plan Note (Signed)
 Incidentally noted sacral lesion that appeared concerning for malignant disease on MRI. Brandon Riley did have his prostate surgery, pathology results did show benign prostatic hyperplasia. He has not yet had his PET scan, he was told by the staff that he needed sedation for this, I spoke to one of the radiologist who advised that sedation was not needed so he will go ahead and schedule his PET scan, he can do a couple Xanax  beforehand. His reason for be concerned about sedation is it did create intolerable ileus after his knee replacement. I have also asked him to go ahead and make the appointment with oncology. He has not yet had his biopsy, and I hope that the PET scan will shed some light on whether we still actually need the biopsy.

## 2023-10-08 NOTE — Patient Instructions (Signed)
 Call [facility] radiology to schedule the PET scan

## 2023-10-08 NOTE — Therapy (Signed)
 OUTPATIENT PHYSICAL THERAPY LOWER EXTREMITY TREATMENT   Patient Name: Brandon Riley MRN: [id number] DOB:December 31, 1960, 63 y.o., male Today's Date: 10/08/2023  END OF SESSION:  PT End of Session - 10/08/23 1059     Visit Number 16    Number of Visits 23    Date for PT Re-Evaluation 09/20/23    Authorization Type aetna    Authorization - Number of Visits 30   per insurance total for the year   PT Start Time 1102    PT Stop Time 1144    PT Time Calculation (min) 42 min    Activity Tolerance Patient tolerated treatment well    Behavior During Therapy WFL for tasks assessed/performed           Past Medical History:  Diagnosis Date   Arthritis    BPH (benign prostatic hyperplasia)    Complication of anesthesia    epidural did not work ended up with general and now trouble w/ urinatiing needing laser enucleation of prastate   GERD (gastroesophageal reflux disease)    Hyperlipemia    Hypertension    OSA on CPAP    Restless leg syndrome    Past Surgical History:  Procedure Laterality Date   CHONDROPLASTY Left 10/28/2018   Procedure: CHONDROPLASTY;  Surgeon: Micheline Ahr, MD;  Location: [facility];  Service: Orthopedics;  Laterality: Left;   HOLEP-LASER ENUCLEATION OF THE PROSTATE WITH MORCELLATION N/A 09/25/2023   Procedure: ENUCLEATION, PROSTATE, USING LASER, WITH MORCELLATION;  Surgeon: Lawerence Pressman, MD;  Location: [facility];  Service: Urology;  Laterality: N/A;   KNEE ARTHROSCOPY WITH MEDIAL MENISECTOMY Left 10/28/2018   Procedure: LEFT KNEE ARTHROSCOPY WITH MEDIAL MENISECTOMY;  Surgeon: Micheline Ahr, MD;  Location:  SURGERY CENTER;  Service: Orthopedics;  Laterality: Left;   RECONSTRUCTION OF NOSE  2019   TOTAL KNEE ARTHROPLASTY Left 07/20/2023   Procedure: ARTHROPLASTY, KNEE, TOTAL;  Surgeon: Murleen Arms, MD;  Location: [facility];  Service: Orthopedics;  Laterality: Left;   WISDOM TOOTH EXTRACTION  2012   Patient Active Problem List   Diagnosis  Date Noted   AKI (acute kidney injury) (HCC) 09/27/2023   Hypersomnia 09/27/2023   Ileus, postoperative (HCC) 09/27/2023   Hydronephrosis of left kidney 09/27/2023   Malignant neoplasm of spine (HCC) 08/31/2023   Sacral lesion 08/04/2023   Urinary retention 08/03/2023   History of total knee arthroplasty, left 07/20/2023   Preoperative clearance 05/12/2023   COVID-19 03/30/2023   Restless leg syndrome 10/08/2022   Dermatitis 09/01/2022   Prediabetes 07/09/2021   BPH (benign prostatic hyperplasia) 06/12/2021   Elevated PSA 09/07/2020   Polyarthralgia 08/31/2020   Steatohepatitis 05/16/2020   Open fracture of left thumb 01/31/2020   GERD (gastroesophageal reflux disease) 11/07/2016   Primary osteoarthritis of right wrist 09/29/2016   Mood disorder (HCC) 12/17/2015   Left insertional Achilles tendinosis 11/05/2015   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   Obstructive sleep apnea on BiPAP with periodic limb movement disorder 06/05/2014   Obesity 05/02/2013   Annual physical exam 04/04/2013   Primary osteoarthritis of both knees with pseudogout 04/04/2013   Degenerative disc disease, cervical 04/04/2013   Hyperlipidemia 04/04/2013   Benign essential hypertension 04/04/2013   Lumbar degenerative disc disease 04/04/2013    PCP: Annemarie Kil MD REFERRING PROVIDER: Priscille Brought, MD REFERRING DIAG:  Diagnosis  [phone] (ICD-10-CM) - S/P total knee replacement   THERAPY DIAG:  Acute pain of left knee  Other abnormalities of gait and mobility  Muscle weakness (generalized)  Rationale for Evaluation and Treatment: Rehabilitation  ONSET DATE: 07/20/23 surgical date  SUBJECTIVE:  SUBJECTIVE STATEMENT: The knee is pretty good, but still not where it needs to be. Since last visit he underwent prostate procedure and then ended up in the hospital due to complications (ileus) for 5 days. Catheter was removed yesterday and has been doing ok so far. He had f/u with  Dr. Elva Hamburger this morning and is having PET scan rescheduled. Patient reports the mornings are rough right now and still has stomach pains that ease up as the day goes on.    EVAL:The patient reports both knees are shot having meniscus repair in the past. He expects pain and is ready to start rehab. He had 1.5 years of pain prior to surgery. His surgery was on 07/20/23 for L TKR and post surgical course was complicated by urinary retention-- he is using a foley cath at this time and has appt with urology next week. He is using ice for pain control and meds.   PERTINENT HISTORY: Covid, RLS, OSA on bipap, HTN, DDD PAIN:  Are you having pain? Yes: NPRS scale: 1 Pain location: Lt knee Pain description: sore,ache,stiff  Aggravating factors: movement Relieving factors: pain meds, ice  PRECAUTIONS: None  WEIGHT BEARING RESTRICTIONS: WBAT  FALLS:  Has patient fallen in last 6 months? Yes, 3--felt balance was off (works on this at [employer])  LIVING ENVIRONMENT: Lives with: lives with their spouse Lives in: House/apartment Stairs: Yes: Internal: 17, basement steps; one rail and 2 walls and External: 2 steps; none Has following equipment at home: Walker - 2 wheeled  PATIENT GOALS: Good quality of life-- be able to do 7/8 of what I used to do.  NEXT MD VISIT: 10/01/23  OBJECTIVE:  Note: Objective measures were completed at Evaluation unless otherwise noted. PATIENT SURVEYS:  LEFS 10% COGNITION: Overall cognitive status: Within functional limits for tasks assessed   EDEMA:  Circumferential: 37 on R and 44 on L PALPATION: Edema L knee with dressing intact LOWER EXTREMITY ROM: Active ROM Right eval Left eval Left 07/27/23 Left 08/03/23 08/13/23 Left  08/17/23 Left 08/20/23 Left  08/24/23 Left 08/26/23 09/18/23 Left 09/23/23 10/08/23 Left AROM  Hip flexion              Hip extension              Hip abduction              Hip adduction              Hip internal rotation              Hip  external rotation              Knee flexion  AROM 75 deg heel slide AAROM 94 deg AAROM 102 deg supine  Sitting AAROM 103 deg 90 AROM 104 AROM heel slide 105 110  111 PROM 108 AROM 112 degrees AAROM knee to chest 110  Knee extension  -9 PROM -12 AROM  -6 deg supine   Lacking 4 in supine  2 degrees (from full extension) prone hang     Ankle dorsiflexion              Ankle plantarflexion              Ankle inversion              Ankle eversion               (  Blank rows = not tested) LOWER EXTREMITY MMT: MMT Right eval Left eval Left 08/17/23 10/08/23 Left   Hip flexion    4- knee pain  Hip extension      Hip abduction      Hip adduction      Hip internal rotation      Hip external rotation      Knee flexion    4+  Knee extension  LAQ to -38 Extensor Lag -20 4  Ankle dorsiflexion      Ankle plantarflexion      Ankle inversion      Ankle eversion       (Blank rows = not tested) FUNCTIONAL TESTS:  Gait speed = 1.23 ft/sec GAIT: Distance walked: 100 ft Assistive device utilized: Environmental consultant - 2 wheeled Level of assistance: Modified independence Comments: stairs with RW since no rails at home with step to pattern and SBA for safety [facility] Adult PT Treatment:                                                DATE: 10/08/23 Therapeutic Exercise: ROM and MMT measurements Supine march 2 x 10  Seated HS curl 2 x 10 green band Standing calf raise 2 x 10  LAQ 2 x 10   Therapeutic Activity: Walking 1 lap in clinic  Sit to stand x 5, x 4 with UE support  Standing march 2 x 10   Self Care: HEP review recommended to d/c prone exercises and limit activity that will stress abdomen (SLR)  Initiate walking program at home   [facility] Adult PT Treatment:                                                DATE: 09/23/23 Therapeutic Exercise: Standing Knee flexion to end range with L foot on 12 step Step ups to 6 step with increased pain today Single leg heel raises Single leg balance dec'ing UE  support L LE Loading through knee-- L leg on mat table with towel roll and performing gentle rocking into flexion (beginning of quadriped position)-- unable to tolerate weightbearing in this position Prone Knee flexion AROM with passive overpressure by PT--fatigues after 8 reps Manual Therapy: STM L hamstring in prone position, supine adductors--much improved from last session due to patient performing self massage Gait: Gait with cues for maintaining L weight shift during mid stance x 400 ft in [facility] Gait speed=2.53 ft/sec   [facility] Adult PT Treatment:                                                DATE: 09/18/23 Therapeutic Exercise: Prone HS isometrics at various degrees of motion  Standing Knee flexion to end range with L foot on 12 step Hip adductor stretch Supine PROM into flexion/extension with overpressure each end range Hip adductor PROM stretch Bent knee fallout Butterfly stretch Strap stretch adductors  Manual Therapy: L hip adductor STM and massage and medial HS Therapeutic Activity: Lateral step ups to 6 step x 10 reps Anterior step downs with eccentric lowering from 4  step x 10 reps Gait: Treadmill warm up x 3 minutes up to 1.8 mph and 3% incline.  Gait with cues for fast/slow speed changes Backward walking  PATIENT EDUCATION:  Education details: see treatment Person educated: Patient,  Education method: Explanation,  Education comprehension: verbalized understanding,  HOME EXERCISE PROGRAM: Access Code: [email or web address] URL: [email or web address] Date: 09/18/2023 Prepared by: Trygve Gage  Exercises - Seated Long Arc Quad  - 1 x daily - 7 x weekly - 2 sets - 10 reps - Supine Straight Leg Raises  - 1 x daily - 7 x weekly - 2 sets - 10 reps - Heel Raises with Counter Support  - 1 x daily - 7 x weekly - 2 sets - 12 reps - Standing Terminal Knee Extension with Resistance  - 1 x daily - 7 x weekly - 2 sets - 10 reps - Prone Knee  Extension with Ankle Weight  - 1 x daily - 7 x weekly - 1 sets - max hold - Backwards Walking  - 1 x daily - 7 x weekly - 3 sets - 10 reps - Standing Hip Abduction with Counter Support  - 1 x daily - 7 x weekly - 2 sets - 10 reps - Standing Hip Extension with Counter Support  - 1 x daily - 7 x weekly - 2 sets - 10 reps - Tandem Stance  - 1 x daily - 7 x weekly - 3 sets - 30 sec  hold - Single Leg Stance  - 1 x daily - 7 x weekly - 3 sets - 30 sec  hold - Hip Adductors and Hamstring Stretch with Strap  - 1 x daily - 5 x weekly - 1 sets - 3 reps - 30 seconds hold - Standing Hamstring Stretch with Step  - 1 x daily - 5 x weekly - 1 sets - 3 reps - 30 seconds hold  ASSESSMENT:  CLINICAL IMPRESSION: Patient returns to PT after recent prostate procedure with complication (ileus) that led to hospitalization for 5 days. He is noted to have slight reduction in knee flexion AROM and does have ongoing hip and knee weakness that could be associated with recent hospitalization. He is noted to have slow gait speed when walking in clinic. PT focused today on gentle LE ROM and strengthening activity given overall deconditioning and will continue to work on normalizing gait, improving gait speed, address ROM/strength deficits to improve functional mobility. Was encouraged to slowly return to HEP (discontinue prone exercises and exercises that aggravate the abdomen) and begin daily walking activity to improve overall strength/endurance with patient verbalizing understanding.    EVAL: Patient is a 63 y.o. male who was seen today for physical therapy evaluation and treatment for L TKR on 07/20/23. He presents today with impairments in ROM, strength, edema, pain, and functional limitations s/p surgery. Patient's hospital course complicated by urinary retention. He is f/u in OP with urology. PT to progress patient to tolerance with goal of returning to prior functional status with reduced pain.    OBJECTIVE IMPAIRMENTS:  Abnormal gait, decreased activity tolerance, decreased balance, decreased endurance, decreased ROM, decreased strength, hypomobility, increased edema, increased fascial restrictions, impaired flexibility, and pain.   GOALS: Goals reviewed with patient? Yes  SHORT TERM GOALS: Target date: 08/21/23  The patient will be indep with initial HEP Baseline: initiated at eval Goal status: MET  2.  The patient will improve LEFS up to 30% to demonstrate improved functional abilities. Baseline:  10% Goal status: DEFERRED  3.   The patient will report pain with walking < or equal to 3/10.  Baseline:  6-7/10 Goal status:MET- 4/28- patient notes sore after gait  4.  The patient will improve AROM L knee to 6 to 100 degrees. Baseline:  12 to 75 Goal status: MET  LONG TERM GOALS: Target date: 09/20/23  The patient will be indep with progression of HEP. Baseline:  initiated at eval Goal status: PARTIALLY MET ON 09/23/23  2.  The patient will improve LEFS up to 50% to demonstrate improved functional abilities. Baseline: 10% Goal status: PARTIALLY MET 42.5% (score is influenced by both the knee and the urinary issues-- he notes it limits some movement)  3.  The patient will improve AROM L knee to 110 degrees flexion. Baseline:  75 degrees Goal status : MET  4.  The patient will improve AROM L knee to -3 degrees full extension. Baseline:  -12 AROM Goal status: MET  5.  The patient will negotiate steps mod indep with reciprocal pattern x 4 steps with one rail. Baseline:  step to pattern with walker and SBA. Goal status: PARTIALLY MET-- ascends reciprocal pattern and descends step to pattern  6.  The patient will improve gait speed to > or equal to 2.5 ft/sec.  Baseline: 1.23 ft/sec Goal status: MET scoring 2.53 ft/sec on 09/23/23   UPDATED GOALS:  SHORT TERM GOALS: Target date: 10/23/23  The patient will be indep with HEP progression. Baseline: has HEP-- has been limited in performing  this week due to other medical appts and procedures Goal status: UPDATED  2.   The patient will demonstrate reciprocal pattern with descending stairs. Baseline:  step to pattern to descend/ able to ascend with reciprocal pattern. Goal status:UPDATED  LONG TERM GOALS: Target date: 11/22/23  The patient will return to gym routine for post d/c activities. Baseline: limited exercises at gym Goal status: UPDATED  2.  The patient will improve LEFS up to 50% to demonstrate improved functional abilities. Baseline: 42.5% ON 09/23/23 Goal status: UPDATED   3.  The patient will improve AROM L knee to 115 degrees Baseline:  112 degrees Goal status : UPDATED  4.  The patient will normalize gait mechanics. Baseline: favors L LE and has antalgic gait upon rising from sitting Goal status: UPDATED   PLAN:  PT FREQUENCY: 2x/week x 4 weeks, 1x/week x 4 weeks (will reduce to 1x/week as soon as able)  PT DURATION: 8 total weeks  PLANNED INTERVENTIONS: 97164- PT Re-evaluation, 97110-Therapeutic exercises, 97530- Therapeutic activity, [id number]- Neuromuscular re-education, 97535- Self Care, 72536- Manual therapy, [id number]- Gait training, [phone]- Electrical stimulation (unattended), 97016- Vasopneumatic device, 97033- Ionotophoresis 4mg /ml Dexamethasone , Patient/Family education, Balance training, Stair training, Taping, Dry Needling, Joint mobilization, DME instructions, and Cryotherapy  PLAN FOR NEXT SESSION:  progress therex to tolerance, Gait training working on normalizing gait mechanics. Balance activity as tolerated.   Kemoni Quesenberry, PT, DPT, ATC 10/08/23 11:45 AM

## 2023-10-08 NOTE — Assessment & Plan Note (Signed)
 Noted during hospitalization, GFR steadily recovered over hospitalization, we will check his labs today. He is voiding easily now.

## 2023-10-08 NOTE — Addendum Note (Signed)
 Addended by: Gean Keels on: 10/08/2023 02:45 PM   Modules accepted: Orders

## 2023-10-08 NOTE — Assessment & Plan Note (Signed)
 Brandon Riley has had a really tough time, he had postoperative ileus after his arthroplasty, he had great difficulty with voiding with multiple catheterizations and hospital visits for urinary obstruction. More recently after failure of conservative treatment a HoLEP of the prostate was recommended, ultimately this was done, afterwards the catheter shifted into the prostatic fossa and he was hospitalized again. He has then been seen by urology, he had a voiding trial yesterday which was successful, his catheter is out. He is here for hospital follow-up, he is voiding, stooling, and passing flatus. His abdomen is still distended, he has significant resonance to percussion, he does have some bowel sounds, he has some tenderness to the left side. We will increase his simethicone  to 180 mg, he will titrate his stool softener for 1-2 stools daily. I would like an abdominal x-ray and ultrasound.

## 2023-10-08 NOTE — Progress Notes (Signed)
    Procedures performed today:    None.  Independent interpretation of notes and tests performed by another provider:   None.  Brief History, Exam, Impression, and Recommendations:    AKI (acute kidney injury) (HCC) Noted during hospitalization, GFR steadily recovered over hospitalization, we will check his labs today. He is voiding easily now.  Sacral lesion Incidentally noted sacral lesion that appeared concerning for malignant disease on MRI. [place] did have his prostate surgery, pathology results did show benign prostatic hyperplasia. He has not yet had his PET scan, he was told by the staff that he needed sedation for this, I spoke to one of the radiologist who advised that sedation was not needed so he will go ahead and schedule his PET scan, he can do a couple Xanax  beforehand. His reason for be concerned about sedation is it did create intolerable ileus after his knee replacement. I have also asked him to go ahead and make the appointment with oncology. He has not yet had his biopsy, and I hope that the PET scan will shed some light on whether we still actually need the biopsy.  Ileus, postoperative (HCC) Wendal has had a really tough time, he had postoperative ileus after his arthroplasty, he had great difficulty with voiding with multiple catheterizations and hospital visits for urinary obstruction. More recently after failure of conservative treatment a HoLEP of the prostate was recommended, ultimately this was done, afterwards the catheter shifted into the prostatic fossa and he was hospitalized again. He has then been seen by urology, he had a voiding trial yesterday which was successful, his catheter is out. He is here for hospital follow-up, he is voiding, stooling, and passing flatus. His abdomen is still distended, he has significant resonance to percussion, he does have some bowel sounds, he has some tenderness to the left side. We will increase his simethicone  to 180 mg,  he will titrate his stool softener for 1-2 stools daily. I would like an abdominal x-ray and ultrasound.  I spent 40 minutes of total time managing this patient today, this includes chart review, face to face, and non-face to face time.  ____________________________________________ Joselyn Nicely. Sandy Crumb, M.D., ABFM., CAQSM., AME. Primary Care and Sports Medicine [facility]  Adjunct Professor of [facility] Medicine  [employer]  Restaurant manager, fast food

## 2023-10-09 ENCOUNTER — Ambulatory Visit: Payer: Self-pay | Admitting: Sports Medicine

## 2023-10-09 LAB — COMPREHENSIVE METABOLIC PANEL WITH GFR
ALT: 27 IU/L (ref 0–44)
AST: 21 IU/L (ref 0–40)
Albumin: 3.6 g/dL — ABNORMAL LOW (ref 3.9–4.9)
Alkaline Phosphatase: 89 IU/L (ref 44–121)
BUN/Creatinine Ratio: 12 (ref 10–24)
BUN: 19 mg/dL (ref 8–27)
Bilirubin Total: 0.4 mg/dL (ref 0.0–1.2)
CO2: 19 mmol/L — ABNORMAL LOW (ref 20–29)
Calcium: 9 mg/dL (ref 8.6–10.2)
Chloride: 98 mmol/L (ref 96–106)
Creatinine, Ser: 1.56 mg/dL — ABNORMAL HIGH (ref 0.76–1.27)
Globulin, Total: 3.4 g/dL (ref 1.5–4.5)
Glucose: 94 mg/dL (ref 70–99)
Potassium: 5 mmol/L (ref 3.5–5.2)
Sodium: 134 mmol/L (ref 134–144)
Total Protein: 7 g/dL (ref 6.0–8.5)
eGFR: 50 mL/min/{1.73_m2} — ABNORMAL LOW (ref >59)

## 2023-10-09 LAB — CBC WITH DIFFERENTIAL/PLATELET
Basophils Absolute: 0.1 10*3/uL (ref 0.0–0.2)
Basos: 1 %
EOS (ABSOLUTE): 0.3 10*3/uL (ref 0.0–0.4)
Eos: 2 %
Hematocrit: 38.4 % (ref 37.5–51.0)
Hemoglobin: 12 g/dL — ABNORMAL LOW (ref 13.0–17.7)
Immature Grans (Abs): 0.1 10*3/uL (ref 0.0–0.1)
Immature Granulocytes: 1 %
Lymphocytes Absolute: 1.6 10*3/uL (ref 0.7–3.1)
Lymphs: 15 %
MCH: 29.6 pg (ref 26.6–33.0)
MCHC: 31.3 g/dL — ABNORMAL LOW (ref 31.5–35.7)
MCV: 95 fL (ref 79–97)
Monocytes Absolute: 0.4 10*3/uL (ref 0.1–0.9)
Monocytes: 4 %
Neutrophils Absolute: 8.6 10*3/uL — ABNORMAL HIGH (ref 1.4–7.0)
Neutrophils: 77 %
Platelets: 415 10*3/uL (ref 150–450)
RBC: 4.06 x10E6/uL — ABNORMAL LOW (ref 4.14–5.80)
RDW: 12.8 % (ref 11.6–15.4)
WBC: 11 10*3/uL — ABNORMAL HIGH (ref 3.4–10.8)

## 2023-10-12 ENCOUNTER — Encounter: Payer: Self-pay | Admitting: Urology

## 2023-10-12 ENCOUNTER — Other Ambulatory Visit: Payer: Self-pay | Admitting: Licensed Clinical Social Worker

## 2023-10-12 DIAGNOSIS — R109 Unspecified abdominal pain: Secondary | ICD-10-CM | POA: Diagnosis not present

## 2023-10-12 DIAGNOSIS — R310 Gross hematuria: Secondary | ICD-10-CM | POA: Diagnosis not present

## 2023-10-12 DIAGNOSIS — Z9889 Other specified postprocedural states: Secondary | ICD-10-CM | POA: Diagnosis not present

## 2023-10-12 DIAGNOSIS — N132 Hydronephrosis with renal and ureteral calculous obstruction: Secondary | ICD-10-CM | POA: Diagnosis not present

## 2023-10-12 DIAGNOSIS — R140 Abdominal distension (gaseous): Secondary | ICD-10-CM | POA: Diagnosis not present

## 2023-10-12 DIAGNOSIS — R10817 Generalized abdominal tenderness: Secondary | ICD-10-CM | POA: Diagnosis not present

## 2023-10-12 DIAGNOSIS — N3001 Acute cystitis with hematuria: Secondary | ICD-10-CM | POA: Diagnosis not present

## 2023-10-12 DIAGNOSIS — Z79899 Other long term (current) drug therapy: Secondary | ICD-10-CM | POA: Diagnosis not present

## 2023-10-12 DIAGNOSIS — R319 Hematuria, unspecified: Secondary | ICD-10-CM | POA: Diagnosis not present

## 2023-10-12 DIAGNOSIS — Z888 Allergy status to other drugs, medicaments and biological substances status: Secondary | ICD-10-CM | POA: Diagnosis not present

## 2023-10-12 DIAGNOSIS — N3000 Acute cystitis without hematuria: Secondary | ICD-10-CM | POA: Diagnosis not present

## 2023-10-12 DIAGNOSIS — R338 Other retention of urine: Secondary | ICD-10-CM | POA: Diagnosis not present

## 2023-10-12 DIAGNOSIS — R1030 Lower abdominal pain, unspecified: Secondary | ICD-10-CM | POA: Diagnosis not present

## 2023-10-12 DIAGNOSIS — K219 Gastro-esophageal reflux disease without esophagitis: Secondary | ICD-10-CM | POA: Diagnosis not present

## 2023-10-12 DIAGNOSIS — N3289 Other specified disorders of bladder: Secondary | ICD-10-CM | POA: Diagnosis not present

## 2023-10-12 DIAGNOSIS — R339 Retention of urine, unspecified: Secondary | ICD-10-CM

## 2023-10-12 DIAGNOSIS — N1330 Unspecified hydronephrosis: Secondary | ICD-10-CM | POA: Diagnosis not present

## 2023-10-12 NOTE — Patient Outreach (Signed)
 Complex Care Management   Visit Note  10/12/2023  Name:  Brandon Riley MRN: [id number] DOB: [birth year]  Situation: Referral received for Complex Care Management related to Mental/Behavioral Health diagnosis Depression I obtained verbal consent from Patient.  Visit completed with patient  on the phone  Background:   Past Medical History:  Diagnosis Date   Arthritis    BPH (benign prostatic hyperplasia)    Complication of anesthesia    epidural did not work ended up with general and now trouble w/ urinatiing needing laser enucleation of prastate   GERD (gastroesophageal reflux disease)    Hyperlipemia    Hypertension    OSA on CPAP    Restless leg syndrome     Assessment: Patient Reported Symptoms:  Cognitive Cognitive Status: Alert and oriented to person, place, and time, Insightful and able to interpret abstract concepts, Normal speech and language skills Cognitive/Intellectual Conditions Management [RPT]: None reported or documented in medical history or problem list   Health Maintenance Behaviors: Annual physical exam, Stress management Healing Pattern: Unsure Health Facilitated by: Stress management, Pain control  Neurological Neurological Review of Symptoms: No symptoms reported    HEENT HEENT Symptoms Reported: Not assessed      Cardiovascular Cardiovascular Symptoms Reported: No symptoms reported Does patient have uncontrolled Hypertension?: Yes Is patient checking Blood Pressure at home?: Yes Patient's Recent BP reading at home: 121/78 Cardiovascular Conditions: Hypertension Cardiovascular Management Strategies: Medication therapy Cardiovascular Self-Management Outcome: 4 (good)  Respiratory Respiratory Symptoms Reported: No symptoms reported    Endocrine Patient reports the following symptoms related to hypoglycemia or hyperglycemia : No symptoms reported    Gastrointestinal Gastrointestinal Symptoms Reported: Abdominal pain or discomfort,  Cramping Gastrointestinal Management Strategies: Medication therapy Gastrointestinal Self-Management Outcome: 3 (uncertain) Gastrointestinal Comment: Pt reports he has been following up with doctor regarding this with medication adjustments.    Genitourinary Genitourinary Symptoms Reported: Not assessed    Integumentary Integumentary Symptoms Reported: Not assessed    Musculoskeletal Musculoskelatal Symptoms Reviewed: Weakness Additional Musculoskeletal Details: Uses a cane or walker outside Musculoskeletal Conditions: Unsteady gait Musculoskeletal Management Strategies: Medical device Musculoskeletal Self-Management Outcome: 3 (uncertain) Musculoskeletal Comment: Pt rports debilitation with recent hospitalizations and medication concerns.      Psychosocial Psychosocial Symptoms Reported: Depression - if selected complete PHQ 2-9, Anxiety - if selected complete GAD Additional Psychological Details: Pt reports signficant changes in mood with his decrease in fuctioning Behavioral Health Conditions: Depression, Anxiety Behavioral Management Strategies: Medication therapy Behavioral Health Self-Management Outcome: 3 (uncertain) Major Change/Loss/Stressor/Fears (CP): Medical condition, self Techniques to Cope with Loss/Stress/Change: Counseling, Medication Quality of Family Relationships: helpful, involved, supportive Do you feel physically threatened by others?: No      10/12/2023   11:14 AM  Depression screen PHQ 2/9  Decreased Interest 1  Down, Depressed, Hopeless 2  PHQ - 2 Score 3  Altered sleeping 1  Tired, decreased energy 3  Change in appetite 2  Feeling bad or failure about yourself  0  Trouble concentrating 1  Moving slowly or fidgety/restless 0  Suicidal thoughts 0  PHQ-9 Score 10  Difficult doing work/chores Somewhat difficult    There were no vitals filed for this visit.  Medications Reviewed Today     Reviewed by Jens Molder, LCSW (Social Worker) on  10/12/23 at 1137  Med List Status: <None>   Medication Order Taking? Sig Documenting Provider Last Dose Status Informant  acetaminophen  (TYLENOL ) 500 MG tablet [id number] Yes Take 500-1,000 mg by mouth every 6 (six) hours as  needed for mild pain (pain score 1-3) or moderate pain (pain score 4-6). [provider]  Active Self, Pharmacy Records  ALPRAZolam  (XANAX ) 0.5 MG tablet [id number] Yes Take 1 tablet (0.5 mg total) by mouth 2 (two) times daily as needed for anxiety.  Patient taking differently: Take 0.5 mg by mouth daily as needed for anxiety.   Gean Keels, MD  Active Self, Pharmacy Records           Med Note (CRUTH[place], Wash Hack Sep 27, 2023  8:37 AM) Has at home but has never taken   metoprolol  tartrate (LOPRESSOR ) 25 MG tablet [id number] Yes Take 1 tablet (25 mg total) by mouth 2 (two) times daily. Ozell Blunt, MD  Active   MILK THISTLE EXTRACT PO [id number]  Take 1 capsule by mouth daily. [provider]  Active Self, Pharmacy Records  oxyCODONE  (ROXICODONE ) 5 MG immediate release tablet [id number]  Take 1 tablet (5 mg total) by mouth every 6 (six) hours as needed for up to 10 doses.  Patient not taking: Reported on 10/12/2023   Lowery Rue, DO  Active Self, Pharmacy Records  phenazopyridine  (PYRIDIUM ) 200 MG tablet [id number]  Take 1 tablet (200 mg total) by mouth 3 (three) times daily as needed for pain.  Patient not taking: Reported on 10/12/2023   Mellie Sprinkle., MD  Active   polyethylene glycol (MIRALAX  / GLYCOLAX ) 17 g packet [id number] Yes Take 17 g by mouth daily as needed for up to 28 days for moderate constipation. Ozell Blunt, MD  Active   rOPINIRole  (REQUIP ) 1 MG tablet [id number] Yes 1-4 tabs PO qHS Gean Keels, MD  Active Self, Pharmacy Records  rosuvastatin  (CRESTOR ) 10 MG tablet [id number] Yes TAKE ONE TABLET BY MOUTH DAILY Thekkekandam, Thomas J, MD  Active Self, Pharmacy Records  senna (SENOKOT) 8.6 MG TABS tablet [id number]  Yes Take 2 tablets (17.2 mg total) by mouth daily as needed for moderate constipation. Ozell Blunt, MD  Active   Simethicone  180 MG CAPS [id number] Yes Take 1 capsule (180 mg total) by mouth 4 (four) times daily as needed (gas/flatulance/bloating). Gean Keels, MD  Active             Recommendation:   CSW spoke with pt regarding EMMI follow up. Pt reported difficulties managing physical health changes and also difficulty coping with these changes from a MH stand point. Pt was agreeable to a referral for [facility] CSW completed initial assessment and pt agreed to follow by CSW to address MH concerns.  Follow Up Plan:   Telephone follow up appointment date/time:  11/02/2023  Hale Level, LCSW [facility] Licensed Clinical Social Worker Care Coordinator [phone]

## 2023-10-13 DIAGNOSIS — R319 Hematuria, unspecified: Secondary | ICD-10-CM | POA: Diagnosis not present

## 2023-10-13 DIAGNOSIS — R10817 Generalized abdominal tenderness: Secondary | ICD-10-CM | POA: Diagnosis not present

## 2023-10-13 DIAGNOSIS — I10 Essential (primary) hypertension: Secondary | ICD-10-CM | POA: Diagnosis not present

## 2023-10-13 DIAGNOSIS — K219 Gastro-esophageal reflux disease without esophagitis: Secondary | ICD-10-CM | POA: Diagnosis not present

## 2023-10-13 DIAGNOSIS — R1084 Generalized abdominal pain: Secondary | ICD-10-CM | POA: Diagnosis not present

## 2023-10-13 DIAGNOSIS — N132 Hydronephrosis with renal and ureteral calculous obstruction: Secondary | ICD-10-CM | POA: Diagnosis not present

## 2023-10-13 DIAGNOSIS — R140 Abdominal distension (gaseous): Secondary | ICD-10-CM | POA: Diagnosis not present

## 2023-10-13 DIAGNOSIS — R109 Unspecified abdominal pain: Secondary | ICD-10-CM | POA: Diagnosis not present

## 2023-10-13 DIAGNOSIS — N3289 Other specified disorders of bladder: Secondary | ICD-10-CM | POA: Diagnosis not present

## 2023-10-13 DIAGNOSIS — Z79899 Other long term (current) drug therapy: Secondary | ICD-10-CM | POA: Diagnosis not present

## 2023-10-13 DIAGNOSIS — N1330 Unspecified hydronephrosis: Secondary | ICD-10-CM | POA: Diagnosis not present

## 2023-10-13 DIAGNOSIS — Z888 Allergy status to other drugs, medicaments and biological substances status: Secondary | ICD-10-CM | POA: Diagnosis not present

## 2023-10-13 DIAGNOSIS — R310 Gross hematuria: Secondary | ICD-10-CM | POA: Diagnosis not present

## 2023-10-14 ENCOUNTER — Telehealth: Payer: Self-pay | Admitting: Urology

## 2023-10-14 ENCOUNTER — Encounter (HOSPITAL_COMMUNITY): Payer: Self-pay

## 2023-10-14 ENCOUNTER — Other Ambulatory Visit: Payer: Self-pay

## 2023-10-14 ENCOUNTER — Encounter (HOSPITAL_COMMUNITY): Payer: Self-pay | Admitting: Urology

## 2023-10-14 ENCOUNTER — Observation Stay (HOSPITAL_COMMUNITY)

## 2023-10-14 ENCOUNTER — Ambulatory Visit: Admitting: Urology

## 2023-10-14 ENCOUNTER — Observation Stay (HOSPITAL_COMMUNITY)
Admission: EM | Admit: 2023-10-14 | Discharge: 2023-10-16 | Disposition: A | Discharge status: Home / Self Care | Source: Other Acute Inpatient Hospital | Attending: Urology | Admitting: Urology

## 2023-10-14 DIAGNOSIS — R109 Unspecified abdominal pain: Secondary | ICD-10-CM | POA: Diagnosis present

## 2023-10-14 DIAGNOSIS — R339 Retention of urine, unspecified: Secondary | ICD-10-CM | POA: Diagnosis not present

## 2023-10-14 DIAGNOSIS — N179 Acute kidney failure, unspecified: Secondary | ICD-10-CM | POA: Diagnosis present

## 2023-10-14 DIAGNOSIS — R319 Hematuria, unspecified: Secondary | ICD-10-CM | POA: Diagnosis present

## 2023-10-14 DIAGNOSIS — G4733 Obstructive sleep apnea (adult) (pediatric): Secondary | ICD-10-CM | POA: Diagnosis present

## 2023-10-14 DIAGNOSIS — N401 Enlarged prostate with lower urinary tract symptoms: Secondary | ICD-10-CM | POA: Diagnosis not present

## 2023-10-14 DIAGNOSIS — N289 Disorder of kidney and ureter, unspecified: Secondary | ICD-10-CM | POA: Diagnosis not present

## 2023-10-14 DIAGNOSIS — I10 Essential (primary) hypertension: Secondary | ICD-10-CM | POA: Diagnosis present

## 2023-10-14 DIAGNOSIS — N1330 Unspecified hydronephrosis: Principal | ICD-10-CM | POA: Insufficient documentation

## 2023-10-14 DIAGNOSIS — N138 Other obstructive and reflux uropathy: Secondary | ICD-10-CM | POA: Diagnosis present

## 2023-10-14 DIAGNOSIS — E785 Hyperlipidemia, unspecified: Secondary | ICD-10-CM | POA: Diagnosis not present

## 2023-10-14 DIAGNOSIS — R310 Gross hematuria: Secondary | ICD-10-CM | POA: Diagnosis not present

## 2023-10-14 DIAGNOSIS — R338 Other retention of urine: Secondary | ICD-10-CM | POA: Diagnosis not present

## 2023-10-14 DIAGNOSIS — K567 Ileus, unspecified: Secondary | ICD-10-CM | POA: Diagnosis not present

## 2023-10-14 DIAGNOSIS — R160 Hepatomegaly, not elsewhere classified: Secondary | ICD-10-CM | POA: Diagnosis not present

## 2023-10-14 DIAGNOSIS — R188 Other ascites: Secondary | ICD-10-CM | POA: Diagnosis not present

## 2023-10-14 DIAGNOSIS — G2581 Restless legs syndrome: Secondary | ICD-10-CM | POA: Diagnosis present

## 2023-10-14 LAB — CBC WITH DIFFERENTIAL/PLATELET
Abs Immature Granulocytes: 0.07 10*3/uL (ref 0.00–0.07)
Basophils Absolute: 0.1 10*3/uL (ref 0.0–0.1)
Basophils Relative: 1 %
Eosinophils Absolute: 0.3 10*3/uL (ref 0.0–0.5)
Eosinophils Relative: 4 %
HCT: 35.9 % — ABNORMAL LOW (ref 39.0–52.0)
Hemoglobin: 11.4 g/dL — ABNORMAL LOW (ref 13.0–17.0)
Immature Granulocytes: 1 %
Lymphocytes Relative: 24 %
Lymphs Abs: 1.6 10*3/uL (ref 0.7–4.0)
MCH: 29.5 pg (ref 26.0–34.0)
MCHC: 31.8 g/dL (ref 30.0–36.0)
MCV: 93 fL (ref 80.0–100.0)
Monocytes Absolute: 0.9 10*3/uL (ref 0.1–1.0)
Monocytes Relative: 13 %
Neutro Abs: 3.9 10*3/uL (ref 1.7–7.7)
Neutrophils Relative %: 57 %
Platelets: 338 10*3/uL (ref 150–400)
RBC: 3.86 MIL/uL — ABNORMAL LOW (ref 4.22–5.81)
RDW: 12.9 % (ref 11.5–15.5)
WBC: 6.8 10*3/uL (ref 4.0–10.5)
nRBC: 0 % (ref 0.0–0.2)

## 2023-10-14 LAB — BASIC METABOLIC PANEL WITH GFR
Anion gap: 12 (ref 5–15)
BUN: 19 mg/dL (ref 8–23)
CO2: 21 mmol/L — ABNORMAL LOW (ref 22–32)
Calcium: 8.7 mg/dL — ABNORMAL LOW (ref 8.9–10.3)
Chloride: 102 mmol/L (ref 98–111)
Creatinine, Ser: 1.36 mg/dL — ABNORMAL HIGH (ref 0.61–1.24)
GFR, Estimated: 59 mL/min — ABNORMAL LOW (ref >60)
Glucose, Bld: 101 mg/dL — ABNORMAL HIGH (ref 70–99)
Potassium: 4.1 mmol/L (ref 3.5–5.1)
Sodium: 135 mmol/L (ref 135–145)

## 2023-10-14 MED ORDER — SODIUM CHLORIDE 0.9 % IR SOLN
3000.0000 mL | Status: DC
  Administered 2023-10-14 (×2): 3000 mL

## 2023-10-14 MED ORDER — ORAL CARE MOUTH RINSE: 15.0000 mL | OROMUCOSAL | Status: DC | PRN

## 2023-10-14 MED ORDER — DIPHENHYDRAMINE HCL 50 MG/ML IJ SOLN: 12.5000 mg | Freq: Four times a day (QID) | INTRAMUSCULAR | Status: DC | PRN

## 2023-10-14 MED ORDER — ONDANSETRON HCL 4 MG/2ML IJ SOLN: 4.0000 mg | INTRAMUSCULAR | Status: DC | PRN

## 2023-10-14 MED ORDER — TRANEXAMIC ACID-NACL 1000-0.7 MG/100ML-% IV SOLN
1000.0000 mg | Freq: Three times a day (TID) | INTRAVENOUS | Status: DC
  Administered 2023-10-14 – 2023-10-16 (×6): 1000 mg via INTRAVENOUS
  Filled 2023-10-14 (×7): qty 100

## 2023-10-14 MED ORDER — DIPHENHYDRAMINE HCL 12.5 MG/5ML PO ELIX: 12.5000 mg | ORAL_SOLUTION | Freq: Four times a day (QID) | ORAL | Status: DC | PRN

## 2023-10-14 MED ORDER — METOPROLOL TARTRATE 25 MG PO TABS
25.0000 mg | ORAL_TABLET | Freq: Two times a day (BID) | ORAL | Status: DC
  Administered 2023-10-14 – 2023-10-16 (×5): 25 mg via ORAL
  Filled 2023-10-14 (×5): qty 1

## 2023-10-14 MED ORDER — OXYCODONE HCL 5 MG PO TABS
5.0000 mg | ORAL_TABLET | ORAL | Status: DC | PRN
  Administered 2023-10-14: 5 mg via ORAL
  Filled 2023-10-14: qty 1

## 2023-10-14 MED ORDER — SODIUM CHLORIDE 0.45 % IV SOLN: INTRAVENOUS | Status: AC

## 2023-10-14 MED ORDER — HYDROMORPHONE HCL 1 MG/ML IJ SOLN: 1.0000 mg | INTRAMUSCULAR | Status: DC | PRN

## 2023-10-14 MED ORDER — ALPRAZOLAM 0.5 MG PO TABS
0.5000 mg | ORAL_TABLET | Freq: Every day | ORAL | Status: DC | PRN
  Administered 2023-10-14 – 2023-10-15 (×2): 0.5 mg via ORAL
  Filled 2023-10-14 (×2): qty 1

## 2023-10-14 MED ORDER — ACETAMINOPHEN 500 MG PO TABS
1000.0000 mg | ORAL_TABLET | Freq: Four times a day (QID) | ORAL | Status: DC
  Administered 2023-10-14 – 2023-10-16 (×8): 1000 mg via ORAL
  Filled 2023-10-14 (×7): qty 2

## 2023-10-14 MED ORDER — ROPINIROLE HCL 1 MG PO TABS
2.0000 mg | ORAL_TABLET | Freq: Every day | ORAL | Status: DC
  Administered 2023-10-14 – 2023-10-15 (×3): 2 mg via ORAL
  Filled 2023-10-14 (×3): qty 2

## 2023-10-14 MED ORDER — SODIUM CHLORIDE 0.9 % IV SOLN: INTRAVENOUS | Status: DC

## 2023-10-14 MED ORDER — POLYETHYLENE GLYCOL 3350 17 G PO PACK
17.0000 g | PACK | Freq: Every day | ORAL | Status: DC | PRN
  Administered 2023-10-14: 17 g via ORAL
  Filled 2023-10-14: qty 1

## 2023-10-14 MED ORDER — CHLORHEXIDINE GLUCONATE CLOTH 2 % EX PADS
6.0000 | MEDICATED_PAD | Freq: Every day | CUTANEOUS | Status: DC
  Administered 2023-10-14 – 2023-10-15 (×2): 6 via TOPICAL

## 2023-10-14 MED ORDER — SIMETHICONE 80 MG PO CHEW
160.0000 mg | CHEWABLE_TABLET | Freq: Four times a day (QID) | ORAL | Status: DC | PRN
  Administered 2023-10-14 – 2023-10-16 (×5): 160 mg via ORAL
  Filled 2023-10-14 (×5): qty 2

## 2023-10-14 MED ORDER — SODIUM CHLORIDE 0.9 % IV SOLN
2.0000 g | Freq: Every day | INTRAVENOUS | Status: DC
  Administered 2023-10-14: 2 g via INTRAVENOUS
  Filled 2023-10-14: qty 20

## 2023-10-14 MED ORDER — DOCUSATE SODIUM 100 MG PO CAPS
100.0000 mg | ORAL_CAPSULE | Freq: Two times a day (BID) | ORAL | Status: DC
  Administered 2023-10-14 – 2023-10-16 (×6): 100 mg via ORAL
  Filled 2023-10-14 (×6): qty 1

## 2023-10-14 MED ORDER — BISACODYL 10 MG RE SUPP: 10.0000 mg | Freq: Every day | RECTAL | Status: DC | PRN

## 2023-10-14 NOTE — Progress Notes (Deleted)
 Assessment: No diagnosis found.   Plan: Foley removed today following successful voiding trial. Cipro  x 1 following foley removal. Return to office in 7-10 days with bladder scan  Chief Complaint:  No chief complaint on file.   History of Present Illness:  Brandon Riley is a 63 y.o. male who is seen for continued evaluation of urinary retention. He reported no problems with urination prior to his surgery.  He previously took tamsulosin  for a short period of time due to increased urinary symptoms.  However, he had discontinued this prior to his recent episode. He underwent a knee arthroplasty on 07/20/2023 and developed postoperative urinary retention.  There was an attempt at spinal anesthesia. He had a Foley catheter placed and was discharged home with a catheter and on tamsulosin .  He presented to [facility] ER on 3/27 with problems with the Foley catheter drainage.  He was found to have some small clots in the catheter.  His Foley was removed at [facility] on 07/30/2023.  He was unable to void following removal and return to the emergency room later that evening.  A Foley catheter was replaced on 07/31/2023 with return of 2000 mL of urine.  He continued on tamsulosin  0.8 mg daily.  He is having regular bowel movements.  No lower extremity weakness or numbness. 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  He has a history of an elevated PSA. PSA results: 10/21 1.2 5/22 6.0  23% free 6/22 6.5  28% free 3/23 5.0  32% free 6/23 4.9  41% free 6/24 4.8  31% free 1/25 5.5  28.5% free  He underwent a prostate biopsy in October 2022 by Dr. Elidia Grout with [facility].  Pathology showed benign prostate tissue with atrophy and associated mixed inflammation. Prostate volume:  125.9 g  His Foley catheter was removed on  08/10/2023 after a successful voiding trial in the office.  Unfortunately, he had difficulty voiding later that day and return to the emergency room where a Foley catheter was placed.  He was changed to silodosin  at that time.   Cystoscopy from 08/20/2023 showed lateral lobe enlargement of the prostate. He was unable to void later that day and required placement of a Foley catheter in the emergency room.  He has undergone a HoLEP by Dr. [name] on 09/25/2023.  A total of 105 g of prostate tissue was removed.  Path showed BPH. He was admitted to the hospital on 09/26/2023 for abdominal pain and AKI.  Evaluation determined that the Foley catheter had pulled into the prostatic fossa.  He was also found to have mild left hydronephrosis.  He was admitted to the hospital for management of his abdominal pain with associated nausea and constipation.  His renal function gradually improved.  His constipation was aggressively managed.  He was discharged on 10/01/2023. His Foley catheter was removed on 10/07/2023 after a successful voiding trial in the office. He developed some gross hematuria with clots several days ago.  This apparently occurred after straining with a bowel movement.  He had difficulty voiding and was seen in the emergency room in [place].  A Foley catheter was placed and the bladder was irrigated with return of clots.  Portions of the above documentation were copied from a prior visit for review purposes only.   Past Medical History:  Past Medical  History:  Diagnosis Date   Arthritis    BPH (benign prostatic hyperplasia)    Complication of anesthesia    epidural did not work ended up with general and now trouble w/ urinatiing needing laser enucleation of prastate   GERD (gastroesophageal reflux disease)    Hyperlipemia    Hypertension    OSA on CPAP    Restless leg syndrome     Past Surgical History:  Past Surgical History:  Procedure Laterality Date   CHONDROPLASTY Left 10/28/2018    Procedure: CHONDROPLASTY;  Surgeon: Micheline Ahr, MD;  Location: [facility];  Service: Orthopedics;  Laterality: Left;   HOLEP-LASER ENUCLEATION OF THE PROSTATE WITH MORCELLATION N/A 09/25/2023   Procedure: ENUCLEATION, PROSTATE, USING LASER, WITH MORCELLATION;  Surgeon: Lawerence Pressman, MD;  Location: [facility];  Service: Urology;  Laterality: N/A;   KNEE ARTHROSCOPY WITH MEDIAL MENISECTOMY Left 10/28/2018   Procedure: LEFT KNEE ARTHROSCOPY WITH MEDIAL MENISECTOMY;  Surgeon: Micheline Ahr, MD;  Location: [facility];  Service: Orthopedics;  Laterality: Left;   RECONSTRUCTION OF NOSE  2019   TOTAL KNEE ARTHROPLASTY Left 07/20/2023   Procedure: ARTHROPLASTY, KNEE, TOTAL;  Surgeon: Murleen Arms, MD;  Location: [facility];  Service: Orthopedics;  Laterality: Left;   WISDOM TOOTH EXTRACTION  2012    Allergies:  Allergies  Allergen Reactions   Celebrex [Celecoxib] Palpitations    Heavy breathing   Flexeril [Cyclobenzaprine] Hives   Iodinated Contrast Media Rash    Rash for 3 days on arm where IV was placed and then next day it spread to the body    Family History:  Family History  Problem Relation Age of Onset   Dementia Mother    Heart Problems Mother        Double bypass   Diabetes Mother    Other Father        Prostate Issues    Social History:  Social History   Tobacco Use   Smoking status: Never    Passive exposure: Never   Smokeless tobacco: Never  Vaping Use   Vaping status: Never Used  Substance Use Topics   Alcohol  use: Not Currently    Comment: 2-3x per week   Drug use: Never    ROS: Constitutional:  Negative for fever, chills, weight loss CV: Negative for chest pain, previous MI, hypertension Respiratory:  Negative for shortness of breath, wheezing, sleep apnea, frequent cough GI:  Negative for nausea, vomiting, bloody stool, GERD  Physical exam: There were no vitals taken for this visit. GENERAL APPEARANCE:  Well  appearing, well developed, well nourished, NAD HEENT:  Atraumatic, normocephalic, oropharynx clear NECK:  Supple without lymphadenopathy or thyromegaly ABDOMEN:  Soft, non-tender, no masses EXTREMITIES:  Moves all extremities well, without clubbing, cyanosis, or edema NEUROLOGIC:  Alert and oriented x 3, normal gait, CN II-XII grossly intact MENTAL STATUS:  appropriate BACK:  Non-tender to palpation, No CVAT SKIN:  Warm, dry, and intact  Results: None

## 2023-10-14 NOTE — H&P (Signed)
 H&P  Chief Complaint: hematuria  History of Present Illness: Brandon Riley is a 63 y.o. year old male with a history of BPH s/p HoLEP on 09/25/23. His postoperative course was complicated by prolonged ileus requiring hospitalization from 6/1-6/5. His foley was removed in clinic non 10/07/23 and he passed TOV.   He noted gross hematuria starting 5 days ago. He presented to [facility] ED x2 in the past 24 hours. Initially a hematuria catheter was placed and he cleared up with CBI so was discharged. He returned with persistent clot obstruction so was placed back on CBI and transferred for management. He notes he is having bowel movements and passing flatus but continues to struggle with abdominal distension.   CT scan in the ED demonstrated moderate left hydro and 2 distal obstructing ureteral calculi (11mm and 4mm). Images unavailable for review.   Labs notable for cr of 1.6. Notably, he had left hydronephrosis on his scans from 6/1 admission but no stones seen in the renal pelvis or ureter. His creatinine has remained elevated to about 1.5-1.6 from baseline 0.7 since surgery.   He has a 3 way foley catheter in place with CBI running.   Past Medical History:  Diagnosis Date   Arthritis    BPH (benign prostatic hyperplasia)    Complication of anesthesia    epidural did not work ended up with general and now trouble w/ urinatiing needing laser enucleation of prastate   GERD (gastroesophageal reflux disease)    Hyperlipemia    Hypertension    OSA on CPAP    Restless leg syndrome     Past Surgical History:  Procedure Laterality Date   CHONDROPLASTY Left 10/28/2018   Procedure: CHONDROPLASTY;  Surgeon: Micheline Ahr, MD;  Location: [facility];  Service: Orthopedics;  Laterality: Left;   HOLEP-LASER ENUCLEATION OF THE PROSTATE WITH MORCELLATION N/A 09/25/2023   Procedure: ENUCLEATION, PROSTATE, USING LASER, WITH MORCELLATION;  Surgeon: Lawerence Pressman, MD;  Location: [facility];  Service: Urology;  Laterality: N/A;   KNEE ARTHROSCOPY WITH MEDIAL MENISECTOMY Left 10/28/2018   Procedure: LEFT KNEE ARTHROSCOPY WITH MEDIAL MENISECTOMY;  Surgeon: Micheline Ahr, MD;  Location: [facility];  Service: Orthopedics;  Laterality: Left;   RECONSTRUCTION OF NOSE  2019   TOTAL KNEE ARTHROPLASTY Left 07/20/2023   Procedure: ARTHROPLASTY, KNEE, TOTAL;  Surgeon: Murleen Arms, MD;  Location: [facility];  Service: Orthopedics;  Laterality: Left;   WISDOM TOOTH EXTRACTION  2012    Home Medications:  Allergies as of 10/14/2023       Reactions   Celebrex [celecoxib] Palpitations   Heavy breathing   Flexeril [cyclobenzaprine] Hives   Iodinated Contrast Media Rash   Rash for 3 days on arm where IV was placed and then next day it spread to the body          Allergies:  Allergies  Allergen Reactions   Celebrex [Celecoxib] Palpitations    Heavy breathing   Flexeril [Cyclobenzaprine] Hives   Iodinated Contrast Media Rash    Rash for 3 days on arm where IV was placed and then next day it spread to the body    Family History  Problem Relation Age of Onset   Dementia Mother    Heart Problems Mother        Double bypass   Diabetes Mother    Other Father        Prostate Issues    Social History:  reports that  he has never smoked. He has never been exposed to tobacco smoke. He has never used smokeless tobacco. He reports that he does not currently use alcohol . He reports that he does not use drugs.  ROS: A complete review of systems was performed.  All systems are negative except for pertinent findings as noted.  Physical Exam:  Vital signs in last 24 hours: Temp:  [97.6 F (36.4 C)-98.2 F (36.8 C)] 97.6 F (36.4 C) (06/18 0755) Pulse Rate:  [68-72] 72 (06/18 0755) Resp:  [16-20] 16 (06/18 0755) BP: (141-151)/(75-96) 141/75 (06/18 0755) SpO2:  [97 %-98 %] 98 % (06/18 0755) Weight:  [99.6 kg] 99.6 kg (06/18 0249) Constitutional:  Alert and  oriented, No acute distress Cardiovascular: Regular rate and rhythm, No JVD Respiratory: Normal respiratory effort, Lungs clear bilaterally GI: Abdomen is soft, moderately distended, nontender GU: foley in place draining light pink urine on moderate drip CBI Neurologic: Grossly intact, no focal deficits Psychiatric: Normal mood and affect   Laboratory Data:  Recent Labs    10/14/23 0406  WBC 6.8  HGB 11.4*  HCT 35.9*   Recent Labs    10/14/23 0406  NA 135  K 4.1  CL 102  CO2 21*  GLUCOSE 101*  BUN 19  CREATININE 1.36*  CALCIUM  8.7*   No results for input(s): LABPT, INR in the last 72 hours. No results for input(s): LABURIN in the last 72 hours. Results for orders placed or performed in visit on 08/27/23  CULTURE, URINE COMPREHENSIVE     Status: Abnormal   Collection Time: 08/27/23 11:51 AM   Specimen: Urine   UR  Result Value Ref Range Status   Urine Culture, Comprehensive Final report (A)  Final   Organism ID, Bacteria Comment (A)  Final    Comment: Staphylococcus epidermidis Based on resistance to oxacillin this isolate would be resistant to all currently available beta-lactam antimicrobial agents, with the exception of the newer cephalosporins with anti-MRSA activity, such as Ceftaroline 2,000 Colonies/mL    ANTIMICROBIAL SUSCEPTIBILITY Comment  Final    Comment:       ** S = Susceptible; I = Intermediate; R = Resistant **                    P = Positive; N = Negative             MICS are expressed in micrograms per mL    Antibiotic                 RSLT#1    RSLT#2    RSLT#3    RSLT#4 Ciprofloxacin                   S Gentamicin                     I Levofloxacin                   S Linezolid                      S Moxifloxacin                   S Nitrofurantoin                 S Oxacillin                      R Penicillin  R Rifampin                       S Tetracycline                   R Trimethoprim /Sulfa               S Vancomycin                     S   Microscopic Examination     Status: Abnormal   Collection Time: 08/27/23 11:51 AM   Urine  Result Value Ref Range Status   WBC, UA 0-5 0 - 5 /hpf Final   RBC, Urine >30 (A) 0 - 2 /hpf Final   Epithelial Cells (non renal) 0-10 0 - 10 /hpf Final   Casts Present (A) None seen /lpf Final   Cast Type Hyaline casts N/A Final   Crystals Present (A) N/A Final   Crystal Type Amorphous Sediment N/A Final   Mucus, UA Present (A) Not Estab. Final   Bacteria, UA Few None seen/Few Final     Radiologic Imaging: No results found.  Impression/Assessment:  62yM with a history of BPH s/p HoLEP, postoperative course c/b ileus and left hydronephrosis with AKI.  Plan:  #Urinary retention #gross hematuria - 3 way foley in place - Continue CBI, wean to light pink - balloon re-seated this morning and 10cc added to balloon. Consider traction pending operative plans.   #Left hydronephrosis #AKI #? Nephrolithiasis - No stones seen on 6/1 CT scan. Now OSH CT report with 11mm left ureteral stone. Unable to view images. Will repeat CT scan now - NPO while awaiting scan results - Consider cysto left ureteral stent placement pending results   #ileus, improving - Bowel regimen (doc/senna, miralax , PRN suppository/enemas) - simethicone  PRN - minimize narcotics   10/14/2023, 7:55 AM

## 2023-10-14 NOTE — TOC CM/SW Note (Signed)
 Transition of Care [facility]) - Inpatient Brief Assessment   Patient Details  Name: Brandon Riley MRN: [id number] Date of Birth: Mar 29, 1961  [facility] CM/SW Contact:    Gertha Ku, LCSW Phone Number: 10/14/2023, 1:57 PM    Transition of Care Asessment: Insurance and Status: Insurance coverage has been reviewed Patient has primary care physician: Yes Home environment has been reviewed: home with spouse Prior level of function:: independent Prior/Current Home Services: No current home services Social Drivers of Health Review: SDOH reviewed no interventions necessary Readmission risk has been reviewed: Yes Transition of care needs: no transition of care needs at this time

## 2023-10-14 NOTE — Plan of Care (Signed)

## 2023-10-14 NOTE — Telephone Encounter (Signed)
 Pt wife called and lvm wanted to make you aware that patient is still at [facility]. They have stated that patent has some demonstrated moderate left hydro and 2 distal obstructing ureteral calculi (11mm and 4mm). Pt wife said they are speaking of doing surgery.

## 2023-10-15 ENCOUNTER — Ambulatory Visit (HOSPITAL_COMMUNITY)

## 2023-10-15 DIAGNOSIS — N401 Enlarged prostate with lower urinary tract symptoms: Secondary | ICD-10-CM | POA: Diagnosis not present

## 2023-10-15 DIAGNOSIS — R339 Retention of urine, unspecified: Secondary | ICD-10-CM | POA: Diagnosis not present

## 2023-10-15 DIAGNOSIS — I10 Essential (primary) hypertension: Secondary | ICD-10-CM | POA: Diagnosis present

## 2023-10-15 DIAGNOSIS — N179 Acute kidney failure, unspecified: Secondary | ICD-10-CM | POA: Diagnosis not present

## 2023-10-15 DIAGNOSIS — K567 Ileus, unspecified: Secondary | ICD-10-CM | POA: Diagnosis present

## 2023-10-15 DIAGNOSIS — E785 Hyperlipidemia, unspecified: Secondary | ICD-10-CM | POA: Diagnosis present

## 2023-10-15 DIAGNOSIS — G2581 Restless legs syndrome: Secondary | ICD-10-CM | POA: Diagnosis present

## 2023-10-15 DIAGNOSIS — R338 Other retention of urine: Secondary | ICD-10-CM | POA: Diagnosis present

## 2023-10-15 DIAGNOSIS — R310 Gross hematuria: Secondary | ICD-10-CM | POA: Diagnosis not present

## 2023-10-15 DIAGNOSIS — N138 Other obstructive and reflux uropathy: Secondary | ICD-10-CM | POA: Diagnosis present

## 2023-10-15 DIAGNOSIS — N1330 Unspecified hydronephrosis: Secondary | ICD-10-CM | POA: Diagnosis not present

## 2023-10-15 DIAGNOSIS — R109 Unspecified abdominal pain: Secondary | ICD-10-CM | POA: Diagnosis present

## 2023-10-15 DIAGNOSIS — G4733 Obstructive sleep apnea (adult) (pediatric): Secondary | ICD-10-CM | POA: Diagnosis present

## 2023-10-15 NOTE — Progress Notes (Signed)
   10/14/23 2135  BiPAP/CPAP/SIPAP  $ Non-Invasive Home Ventilator  Initial  BiPAP/CPAP/SIPAP Pt Type Adult  BiPAP/CPAP/SIPAP Resmed  Mask Type Nasal mask  Dentures removed? Not applicable  Mask Size Small  FiO2 (%) 21 %  Patient Home Machine No  Patient Home Mask No  Patient Home Tubing No  Auto Titrate Yes  Minimum cmH2O 4 cmH2O  Maximum cmH2O 12 cmH2O (per home setting)  CPAP/SIPAP surface wiped down Yes  Device Plugged into RED Power Outlet Yes  BiPAP/CPAP /SiPAP Vitals  Bilateral Breath Sounds Clear;Diminished  MEWS Score/Color  MEWS Score 0  MEWS Score Color Marrie Sizer

## 2023-10-15 NOTE — Progress Notes (Signed)
 Urology Inpatient Progress Report  Hydronephrosis [N13.30]     Intv/Subj: No acute events overnight. Patient is without complaint. Has been weaned off CBI, urine is a straw-colored. Had some mild abdominal pain, which we treated with IV Tylenol  and it resolved.  Had a bowel movement yesterday  Principal Problem:   Hydronephrosis  Current Facility-Administered Medications  Medication Dose Route Frequency Provider Last Rate Last Admin   0.45 % sodium chloride  infusion   Intravenous Continuous Andrez Banker, MD 100 mL/hr at 10/15/23 0315 New Bag at 10/15/23 0315   acetaminophen  (TYLENOL ) tablet 1,000 mg  1,000 mg Oral Q6H Alphonza Ashing, MD   1,000 mg at 10/15/23 5621   ALPRAZolam  (XANAX ) tablet 0.5 mg  0.5 mg Oral Daily PRN Alphonza Ashing, MD   0.5 mg at 10/14/23 1827   bisacodyl  (DULCOLAX) suppository 10 mg  10 mg Rectal Daily PRN Alphonza Ashing, MD       Chlorhexidine  Gluconate Cloth 2 % PADS 6 each  6 each Topical Daily Andrez Banker, MD   6 each at 10/15/23 3086   diphenhydrAMINE  (BENADRYL ) injection 12.5-25 mg  12.5-25 mg Intravenous Q6H PRN Alphonza Ashing, MD       Or   diphenhydrAMINE  (BENADRYL ) 12.5 MG/5ML elixir 12.5-25 mg  12.5-25 mg Oral Q6H PRN Alphonza Ashing, MD       docusate sodium  (COLACE) capsule 100 mg  100 mg Oral BID Alphonza Ashing, MD   100 mg at 10/15/23 5784   HYDROmorphone  (DILAUDID ) injection 1 mg  1 mg Intravenous Q3H PRN Stoneking, Bradley J., MD       metoprolol  tartrate (LOPRESSOR ) tablet 25 mg  25 mg Oral BID Alphonza Ashing, MD   25 mg at 10/15/23 6962   ondansetron  (ZOFRAN ) injection 4 mg  4 mg Intravenous Q4H PRN Alphonza Ashing, MD       Oral care mouth rinse  15 mL Mouth Rinse PRN Andrez Banker, MD       oxyCODONE  (Oxy IR/ROXICODONE ) immediate release tablet 5 mg  5 mg Oral Q4H PRN Alphonza Ashing, MD   5 mg at 10/14/23 1828   polyethylene glycol (MIRALAX  / GLYCOLAX ) packet 17 g  17 g Oral Daily PRN Alphonza Ashing, MD   17 g at 10/14/23 1439   rOPINIRole  (REQUIP )  tablet 2 mg  2 mg Oral QHS Alphonza Ashing, MD   2 mg at 10/14/23 2122   simethicone  (MYLICON) chewable tablet 160 mg  160 mg Oral QID PRN Alphonza Ashing, MD   160 mg at 10/15/23 0921   sodium chloride  irrigation 0.9 % 3,000 mL  3,000 mL Irrigation Continuous Kay, Hannah, MD   3,000 mL at 10/14/23 0756   tranexamic acid  (CYKLOKAPRON ) IVPB 1,000 mg  1,000 mg Intravenous TID Alphonza Ashing, MD 600 mL/hr at 10/15/23 0923 1,000 mg at 10/15/23 0923     Objective: Vital: Vitals:   10/14/23 0755 10/14/23 1240 10/14/23 1950 10/15/23 0354  BP: (!) 141/75 125/79 131/83 126/81  Pulse: 72 65 72 63  Resp: 16 18 18 17   Temp: 97.6 F (36.4 C) 98.7 F (37.1 C) 98.3 F (36.8 C) 98.1 F (36.7 C)  TempSrc: Oral Oral Oral Axillary  SpO2: 98% 97% 95% 98%  Weight:      Height:       I/Os: I/O last 3 completed shifts: In: 1917.2 [P.O.:300; I.V.:1417.2; IV Piggyback:200] Out: 95284 [Urine:10900]  Physical Exam:  General: Patient is in no apparent distress Lungs: Normal respiratory effort, chest expands symmetrically.  GI: The abdomen is soft and nontender without mass. Foley: Straw-colored CBI Ext: lower extremities symmetric  Lab Results: Recent Labs    10/14/23 0406  WBC 6.8  HGB 11.4*  HCT 35.9*   Recent Labs    10/14/23 0406  NA 135  K 4.1  CL 102  CO2 21*  GLUCOSE 101*  BUN 19  CREATININE 1.36*  CALCIUM  8.7*   No results for input(s): LABPT, INR in the last 72 hours. No results for input(s): LABURIN in the last 72 hours. Results for orders placed or performed in visit on 08/27/23  CULTURE, URINE COMPREHENSIVE     Status: Abnormal   Collection Time: 08/27/23 11:51 AM   Specimen: Urine   UR  Result Value Ref Range Status   Urine Culture, Comprehensive Final report (A)  Final   Organism ID, Bacteria Comment (A)  Final    Comment: Staphylococcus epidermidis Based on resistance to oxacillin this isolate would be resistant to all currently available beta-lactam antimicrobial  agents, with the exception of the newer cephalosporins with anti-MRSA activity, such as Ceftaroline 2,000 Colonies/mL    ANTIMICROBIAL SUSCEPTIBILITY Comment  Final    Comment:       ** S = Susceptible; I = Intermediate; R = Resistant **                    P = Positive; N = Negative             MICS are expressed in micrograms per mL    Antibiotic                 RSLT#1    RSLT#2    RSLT#3    RSLT#4 Ciprofloxacin                   S Gentamicin                     I Levofloxacin                   S Linezolid                      S Moxifloxacin                   S Nitrofurantoin                 S Oxacillin                      R Penicillin                     R Rifampin                       S Tetracycline                   R Trimethoprim /Sulfa              S Vancomycin                     S   Microscopic Examination     Status: Abnormal   Collection Time: 08/27/23 11:51 AM   Urine  Result Value Ref Range Status   WBC, UA 0-5 0 - 5 /hpf Final   RBC, Urine >30 (A) 0 - 2 /hpf Final   Epithelial Cells (non renal) 0-10 0 - 10 /hpf  Final   Casts Present (A) None seen /lpf Final   Cast Type Hyaline casts N/A Final   Crystals Present (A) N/A Final   Crystal Type Amorphous Sediment N/A Final   Mucus, UA Present (A) Not Estab. Final   Bacteria, UA Few None seen/Few Final    Studies/Results: CT ABDOMEN PELVIS WO CONTRAST Result Date: 10/14/2023 CLINICAL DATA:  Hydronephrosis. EXAM: CT ABDOMEN AND PELVIS WITHOUT CONTRAST TECHNIQUE: Multidetector CT imaging of the abdomen and pelvis was performed following the standard protocol without IV contrast. RADIATION DOSE REDUCTION: This exam was performed according to the departmental dose-optimization program which includes automated exposure control, adjustment of the mA and/or kV according to patient size and/or use of iterative reconstruction technique. COMPARISON:  09/27/2023. FINDINGS: Lower chest: Slight increase in collapse/consolidation seen  dependently in the left lower lobe. Subsegmental atelectasis in the inferior right lower lobe. Trace left pleural effusion. Heart is at the upper limits of normal in size to mildly enlarged. No pericardial effusion. Distal esophagus is grossly unremarkable. Hepatobiliary: Liver is decreased in attenuation diffusely and is enlarged, 18.7 cm. Liver and gallbladder are otherwise grossly unremarkable. No biliary ductal dilatation. Pancreas: Negative. Spleen: Negative. Adrenals/Urinary Tract: Adrenal glands are unremarkable. Low-attenuation lesions in the kidneys. No specific follow-up necessary. Left renal edema with moderate to severe hydronephrosis, increased from 09/27/2023. No obstructing stone. Ureter is dilated to the level of the ureteral vesicle junction. Bladder wall appears slightly thickened but the bladder is under distended, with a Foley catheter in place. Small amount of air in the bladder is related to the Foley catheter. Stomach/Bowel: Stomach, small bowel and appendix are unremarkable. Moderate stool burden. Colon is otherwise unremarkable. Vascular/Lymphatic: Atherosclerotic calcification of the aorta. No pathologically enlarged lymph nodes. 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 Other: Minimal presacral edema. Loculated ascites in the left upper quadrant and left abdomen, new. Musculoskeletal: Degenerative changes in the spine. IMPRESSION: 1. Moderate to severe left hydronephrosis, increased from 09/27/2023 and possibly due to bladder wall thickening and/or recent prostate laser procedure. Please correlate clinically. No obstructing stone. 2. New small loculated ascites in left upper quadrant abdomen. 3. Slight increase in collapse/consolidation in the left lower lobe, likely due to atelectasis. Associated tiny left pleural effusion. 4. Steatotic enlarged liver. 5. Moderate stool burden. 6.  Aortic atherosclerosis (ICD10-I70.0). Electronically Signed   By: Shearon Denis M.D.   On: 10/14/2023 13:04    Assessment/ Plan: Patient has gross hematuria that has significantly proved.  Plan is to remove the patient's catheter today and give him a trial of voiding.  We will rack his urine to ensure that it is clearing.  Will continue with TXA 3 times daily.  Will plan for him to discharge tomorrow if things go well today.  Salli Crawley, MD Urology 10/15/2023, 11:10 AM

## 2023-10-15 NOTE — Plan of Care (Signed)

## 2023-10-16 ENCOUNTER — Encounter (HOSPITAL_BASED_OUTPATIENT_CLINIC_OR_DEPARTMENT_OTHER): Payer: Self-pay

## 2023-10-16 ENCOUNTER — Encounter: Payer: Self-pay | Admitting: Sports Medicine

## 2023-10-16 ENCOUNTER — Ambulatory Visit (HOSPITAL_BASED_OUTPATIENT_CLINIC_OR_DEPARTMENT_OTHER)

## 2023-10-16 ENCOUNTER — Telehealth: Payer: Self-pay | Admitting: Urology

## 2023-10-16 DIAGNOSIS — N401 Enlarged prostate with lower urinary tract symptoms: Secondary | ICD-10-CM | POA: Diagnosis not present

## 2023-10-16 NOTE — Telephone Encounter (Signed)
 Pt wife called and stated [name] just got out of the hospital wanted to know when you would like Brandon Riley to follow up with you. She stated he has apt with the surgeon already scheduled. Please advise.

## 2023-10-16 NOTE — Discharge Summary (Signed)
 Date of admission: 10/14/2023  Date of discharge: 10/16/2023  Admission diagnosis: gross hematuria, urinary retention  Discharge diagnosis: same  Secondary diagnoses:  Patient Active Problem List   Diagnosis Date Noted   Hydronephrosis 10/14/2023   AKI (acute kidney injury) (HCC) 09/27/2023   Hypersomnia 09/27/2023   Ileus, postoperative (HCC) 09/27/2023   Hydronephrosis of left kidney 09/27/2023   Malignant neoplasm of spine (HCC) 08/31/2023   Sacral lesion 08/04/2023   Urinary retention 08/03/2023   History of total knee arthroplasty, left 07/20/2023   Preoperative clearance 05/12/2023   COVID-19 03/30/2023   Restless leg syndrome 10/08/2022   Dermatitis 09/01/2022   Prediabetes 07/09/2021   BPH (benign prostatic hyperplasia) 06/12/2021   Elevated PSA 09/07/2020   Polyarthralgia 08/31/2020   Steatohepatitis 05/16/2020   Open fracture of left thumb 01/31/2020   GERD (gastroesophageal reflux disease) 11/07/2016   Primary osteoarthritis of right wrist 09/29/2016   Mood disorder (HCC) 12/17/2015   Left insertional Achilles tendinosis 11/05/2015   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 Briefly, RYLER LASKOWSKI is a 63 y.o. year old patient with BPH with obstruction, admitted with gross hematuria and abdominal pain.   Hospital Course:  The patient was transferred from [facility] for an allegedly left sided distal obstructing ureteral stone and clot within his bladder.  CT scan  obtained upon admission demonstrated no obstructing stone, known left-sided hydronephrosis, and no clot within his bladder.  We were able to quickly wean him off of continuous bladder irrigation and kept him on scheduled TXA throughout his admission.  On hospital day #2 the Foley catheter was removed.  We performed a string of bottles and monitored his urine color over the course of the next 24 hours and it cleared as time moved on.  At the time of discharge the patient's urine was straw-colored.  The patient had some intermittent abdominal pain which seemed to be self-limiting.  At the time of discharge she was having bowel movements and he was without pain.  The patient is being discharged with no new medications.  I discontinued all his narcotics and any anticholinergics.  He is not being sent home on antibiotics.  He is being scheduled for follow-up with Dr. Ancel Kass.  At that time they will investigate the left hydronephrosis further.  At the time of discharge the patient was voiding on his own, his pain was well-controlled, he was up moving around.  He was offered pain medication and IV fluids.  He was tolerating a regular diet and having normal bowel movements.  Physical exam on day of discharge  No acute distress Nonlabored breathing Abdomen soft nontender Urine straw-colored Extremities symmetric  Laboratory values:  Recent Labs    10/14/23 0406  WBC 6.8  HGB 11.4*  HCT 35.9*   Recent Labs    10/14/23 0406  NA 135  K 4.1  CL 102  CO2 21*  GLUCOSE 101*  BUN 19  CREATININE 1.36*  CALCIUM  8.7*   No results for input(s): LABPT, INR in the last 72 hours. No results for  input(s): LABURIN in the last 72 hours. Results for orders placed or performed in visit on 08/27/23  CULTURE, URINE COMPREHENSIVE     Status: Abnormal   Collection Time: 08/27/23 11:51 AM   Specimen: Urine   UR  Result Value Ref Range Status   Urine Culture, Comprehensive Final report (A)  Final    Organism ID, Bacteria Comment (A)  Final    Comment: Staphylococcus epidermidis Based on resistance to oxacillin this isolate would be resistant to all currently available beta-lactam antimicrobial agents, with the exception of the newer cephalosporins with anti-MRSA activity, such as Ceftaroline 2,000 Colonies/mL    ANTIMICROBIAL SUSCEPTIBILITY Comment  Final    Comment:       ** S = Susceptible; I = Intermediate; R = Resistant **                    P = Positive; N = Negative             MICS are expressed in micrograms per mL    Antibiotic                 RSLT#1    RSLT#2    RSLT#3    RSLT#4 Ciprofloxacin                   S Gentamicin                     I Levofloxacin                   S Linezolid                      S Moxifloxacin                   S Nitrofurantoin                 S Oxacillin                      R Penicillin                     R Rifampin                       S Tetracycline                   R Trimethoprim /Sulfa              S Vancomycin                     S   Microscopic Examination     Status: Abnormal   Collection Time: 08/27/23 11:51 AM   Urine  Result Value Ref Range Status   WBC, UA 0-5 0 - 5 /hpf Final   RBC, Urine >30 (A) 0 - 2 /hpf Final   Epithelial Cells (non renal) 0-10 0 - 10 /hpf Final   Casts Present (A) None seen /lpf Final   Cast Type Hyaline casts N/A Final   Crystals Present (A) N/A Final   Crystal Type Amorphous Sediment N/A Final   Mucus, UA Present (A) Not Estab. Final   Bacteria, UA Few None seen/Few Final    Disposition: Home  Discharge instruction: The patient was instructed to be ambulatory but told to refrain from heavy lifting, strenuous activity, or driving.   Discharge medications:  Allergies as of  10/16/2023       Reactions   Celebrex [celecoxib] Palpitations   Heavy breathing   Flexeril [cyclobenzaprine] Hives   Iodinated Contrast Media Rash   Rash for 3 days on arm where IV was placed and then next day it  spread to the body        Medication List     STOP taking these medications    cephALEXin  500 MG capsule Commonly known as: KEFLEX    oxybutynin  10 MG 24 hr tablet Commonly known as: DITROPAN -XL   oxyCODONE  5 MG immediate release tablet Commonly known as: Roxicodone    oxyCODONE -acetaminophen  10-325 MG tablet Commonly known as: PERCOCET   traMADol  50 MG tablet Commonly known as: ULTRAM        TAKE these medications    acetaminophen  500 MG tablet Commonly known as: TYLENOL  Take 1,000 mg by mouth every 6 (six) hours as needed for mild pain (pain score 1-3) or moderate pain (pain score 4-6).   ALPRAZolam  0.5 MG tablet Commonly known as: XANAX  Take 1 tablet (0.5 mg total) by mouth 2 (two) times daily as needed for anxiety. What changed: when to take this   CHERRY PO Take 250 mg by mouth in the morning, at noon, and at bedtime.   metoprolol  tartrate 25 MG tablet Commonly known as: LOPRESSOR  Take 1 tablet (25 mg total) by mouth 2 (two) times daily.   ondansetron  4 MG disintegrating tablet Commonly known as: ZOFRAN -ODT Take 4 mg by mouth.   phenazopyridine  200 MG tablet Commonly known as: Pyridium  Take 1 tablet (200 mg total) by mouth 3 (three) times daily as needed for pain.   polyethylene glycol 17 g packet Commonly known as: MIRALAX  / GLYCOLAX  Take 17 g by mouth daily as needed for up to 28 days for moderate constipation. What changed: when to take this   rOPINIRole  1 MG tablet Commonly known as: REQUIP  1-4 tabs PO qHS What changed:  how much to take how to take this when to take this additional instructions   rosuvastatin  10 MG tablet Commonly known as: CRESTOR  TAKE ONE TABLET BY MOUTH DAILY What changed: when to take this   senna 8.6 MG Tabs tablet Commonly known as: SENOKOT Take 2 tablets (17.2 mg total) by mouth daily as needed for moderate constipation. What changed: how much to take   Simethicone  180 MG Caps Take 1 capsule (180 mg total)  by mouth 4 (four) times daily as needed (gas/flatulance/bloating).   VITAMIN C PO Take 2 tablets by mouth every other day.        Followup:   Follow-up Information     Lawerence Pressman, MD. Schedule an appointment as soon as possible for a visit in 1 month(s).   Specialty: Urology Contact information: [address] [place] [address] [phone]

## 2023-10-16 NOTE — Discharge Instructions (Signed)
 Transurethral Resection of the Prostate (TURP) or Holmium laser enucleation of the Prostate  Care After  Refer to this sheet in the next few weeks. These discharge instructions provide you with general information on caring for yourself after you leave the hospital. Your caregiver may also give you specific instructions. Your treatment has been planned according to the most current medical practices available, but unavoidable complications sometimes occur. If you have any problems or questions after discharge, please call your caregiver.  HOME CARE INSTRUCTIONS   Medications You may receive medicine for pain management. As your level of discomfort decreases, adjustments in your pain medicines may be made.  Take all medicines as directed.  You may be given a medicine (antibiotic) to kill germs following surgery. Finish all medicines. Let your caregiver know if you have any side effects or problems from the medicine.  If you are on aspirin , it would be best not to restart the aspirin  until the blood in the urine clears Hygiene You can take a shower after surgery.  You should not take a bath while you still have the urethral catheter. Activity You will be encouraged to get out of bed as much as possible and increase your activity level as tolerated.  Spend the first week in and around your home. For 3 weeks, avoid the following:  Straining.  Running.  Strenuous work.  Walks longer than a few blocks.  Riding for extended periods.  Sexual relations.  Do not lift heavy objects (more than 20 pounds) for at least 1 month. When lifting, use your arms instead of your abdominal muscles.  You will be encouraged to walk as tolerated. Do not exert yourself. Increase your activity level slowly. Remember that it is important to keep moving after an operation of any type. This cuts down on the possibility of developing blood clots.  Your caregiver will tell you when you can resume driving and light  housework. Discuss this at your first office visit after discharge. Diet No special diet is ordered after a TURP. However, if you are on a special diet for another medical problem, it should be continued.  Normal fluid intake is usually recommended.  Avoid alcohol  and caffeinated drinks for 2 weeks. They irritate the bladder. Decaffeinated drinks are okay.  Avoid spicy foods.  Bladder Function For the first 10 days, empty the bladder whenever you feel a definite desire. Do not try to hold the urine for long periods of time.  Urinating once or twice a night even after you are healed is not uncommon.  You may see some recurrence of blood in the urine after discharge from the hospital. This usually happens within 2 weeks after the procedure.If this occurs, force fluids again as you did in the hospital and reduce your activity.  Bowel Function You may experience some constipation after surgery. This can be minimized by increasing fluids and fiber in your diet. Drink enough water  and fluids to keep your urine clear or pale yellow.  A stool softener may be prescribed for use at home. Do not strain to move your bowels.  If you are requiring increased pain medicine, it is important that you take stool softeners to prevent constipation. This will help to promote proper healing by reducing the need to strain to move your bowels.  Sexual Activity Semen movement in the opposite direction and into the bladder (retrograde ejaculation) may occur. Since the semen passes into the bladder, cloudy urine can occur the first time you urinate  after intercourse. Or, you may not have an ejaculation during erection. Ask your caregiver when you can resume sexual activity. Retrograde ejaculation and reduced semen discharge should not reduce one's pleasure of intercourse.  Postoperative Visit Arrange the date and time of your after surgery visit with your caregiver.  Return to Work After your recovery is complete, you will  be able to return to work and resume all activities. Your caregiver will inform you when you can return to work.

## 2023-10-19 ENCOUNTER — Ambulatory Visit (INDEPENDENT_AMBULATORY_CARE_PROVIDER_SITE_OTHER): Admitting: Sports Medicine

## 2023-10-19 VITALS — BP 128/88 | HR 82 | Temp 97.3°F | Wt 217.0 lb

## 2023-10-19 DIAGNOSIS — L304 Erythema intertrigo: Secondary | ICD-10-CM | POA: Diagnosis not present

## 2023-10-19 DIAGNOSIS — K219 Gastro-esophageal reflux disease without esophagitis: Secondary | ICD-10-CM

## 2023-10-19 MED ORDER — PANTOPRAZOLE SODIUM 40 MG PO TBEC
40.0000 mg | DELAYED_RELEASE_TABLET | Freq: Every day | ORAL | 3 refills | Status: DC
Start: 2023-10-19 — End: 2023-11-03

## 2023-10-19 MED ORDER — NYSTATIN POWD: 11 refills | Status: DC

## 2023-10-19 NOTE — Progress Notes (Addendum)
    Procedures performed today:    None.  Independent interpretation of notes and tests performed by another provider:   None.  Brief History, Exam, Impression, and Recommendations:    GERD (gastroesophageal reflux disease) Brandon Riley has been using a good amount of Motrin, he is extremely stressed with multiple hospitalizations and ED visits for urinary obstruction status post TURP. He does not note any melena, hematochezia, hematemesis but he does note significant bloating and upper abdominal discomfort, we will add pantoprazole  40 daily, he will hold off on all NSAIDs for now.  Intertrigo Noted some intertrigo in the groin, they have been using Desitin. We will switch to nystatin powder, if insufficient improvement we will start with topical clotrimazole .    ____________________________________________ Debby PARAS. Curtis, M.D., ABFM., CAQSM., AME. Primary Care and Sports Medicine [facility]  Adjunct Professor of [facility] [employer] Medicine  [employer]  Restaurant manager, fast food

## 2023-10-19 NOTE — Addendum Note (Signed)
 Addended by: CURTIS DEBBY PARAS on: 10/19/2023 10:40 AM   Modules accepted: Orders

## 2023-10-19 NOTE — Assessment & Plan Note (Signed)
 Brandon Riley has been using a good amount of Motrin, he is extremely stressed with multiple hospitalizations and ED visits for urinary obstruction status post TURP. He does not note any melena, hematochezia, hematemesis but he does note significant bloating and upper abdominal discomfort, we will add pantoprazole  40 daily, he will hold off on all NSAIDs for now.

## 2023-10-19 NOTE — Assessment & Plan Note (Signed)
 Noted some intertrigo in the groin, they have been using Desitin. We will switch to nystatin powder, if insufficient improvement we will start with topical clotrimazole .

## 2023-10-20 DIAGNOSIS — R319 Hematuria, unspecified: Secondary | ICD-10-CM | POA: Diagnosis not present

## 2023-10-20 DIAGNOSIS — N1330 Unspecified hydronephrosis: Secondary | ICD-10-CM | POA: Diagnosis not present

## 2023-10-20 DIAGNOSIS — Z87891 Personal history of nicotine dependence: Secondary | ICD-10-CM | POA: Diagnosis not present

## 2023-10-20 DIAGNOSIS — R1084 Generalized abdominal pain: Secondary | ICD-10-CM | POA: Diagnosis not present

## 2023-10-20 DIAGNOSIS — N401 Enlarged prostate with lower urinary tract symptoms: Secondary | ICD-10-CM | POA: Diagnosis not present

## 2023-10-20 DIAGNOSIS — R109 Unspecified abdominal pain: Secondary | ICD-10-CM | POA: Diagnosis not present

## 2023-10-20 DIAGNOSIS — R935 Abnormal findings on diagnostic imaging of other abdominal regions, including retroperitoneum: Secondary | ICD-10-CM | POA: Diagnosis not present

## 2023-10-20 DIAGNOSIS — Z9889 Other specified postprocedural states: Secondary | ICD-10-CM | POA: Diagnosis not present

## 2023-10-20 DIAGNOSIS — N132 Hydronephrosis with renal and ureteral calculous obstruction: Secondary | ICD-10-CM | POA: Diagnosis not present

## 2023-10-21 ENCOUNTER — Encounter (INDEPENDENT_AMBULATORY_CARE_PROVIDER_SITE_OTHER): Payer: Self-pay | Admitting: Sports Medicine

## 2023-10-21 ENCOUNTER — Ambulatory Visit: Payer: Self-pay | Admitting: Rehabilitative and Restorative Service Providers"

## 2023-10-21 ENCOUNTER — Encounter: Payer: Self-pay | Admitting: Rehabilitative and Restorative Service Providers"

## 2023-10-21 DIAGNOSIS — Z0000 Encounter for general adult medical examination without abnormal findings: Secondary | ICD-10-CM | POA: Diagnosis not present

## 2023-10-21 DIAGNOSIS — R2689 Other abnormalities of gait and mobility: Secondary | ICD-10-CM

## 2023-10-21 DIAGNOSIS — R109 Unspecified abdominal pain: Secondary | ICD-10-CM

## 2023-10-21 DIAGNOSIS — M25562 Pain in left knee: Secondary | ICD-10-CM

## 2023-10-21 DIAGNOSIS — M6281 Muscle weakness (generalized): Secondary | ICD-10-CM | POA: Diagnosis not present

## 2023-10-21 DIAGNOSIS — N1330 Unspecified hydronephrosis: Secondary | ICD-10-CM | POA: Diagnosis not present

## 2023-10-21 NOTE — Therapy (Signed)
 OUTPATIENT PHYSICAL THERAPY LOWER EXTREMITY TREATMENT   Patient Name: Brandon Riley MRN: [id number] DOB:July 20, 1960, 63 y.o., male Today's Date: 10/21/2023  END OF SESSION:  PT End of Session - 10/21/23 1316     Visit Number 17    Number of Visits 25    Date for PT Re-Evaluation 11/22/23    Authorization Type aetna    Authorization - Number of Visits 30   per insurance total for the year   PT Start Time 1316    PT Stop Time 1400    PT Time Calculation (min) 44 min    Activity Tolerance Patient tolerated treatment well    Behavior During Therapy WFL for tasks assessed/performed           Past Medical History:  Diagnosis Date   Arthritis    BPH (benign prostatic hyperplasia)    Complication of anesthesia    epidural did not work ended up with general and now trouble w/ urinatiing needing laser enucleation of prastate   GERD (gastroesophageal reflux disease)    Hyperlipemia    Hypertension    OSA on CPAP    Restless leg syndrome    Past Surgical History:  Procedure Laterality Date   CHONDROPLASTY Left 10/28/2018   Procedure: CHONDROPLASTY;  Surgeon: Cristy Bonner DASEN, MD;  Location: [facility];  Service: Orthopedics;  Laterality: Left;   HOLEP-LASER ENUCLEATION OF THE PROSTATE WITH MORCELLATION N/A 09/25/2023   Procedure: ENUCLEATION, PROSTATE, USING LASER, WITH MORCELLATION;  Surgeon: Francisca Redell BROCKS, MD;  Location: [facility];  Service: Urology;  Laterality: N/A;   KNEE ARTHROSCOPY WITH MEDIAL MENISECTOMY Left 10/28/2018   Procedure: LEFT KNEE ARTHROSCOPY WITH MEDIAL MENISECTOMY;  Surgeon: Cristy Bonner DASEN, MD;  Location: [facility];  Service: Orthopedics;  Laterality: Left;   RECONSTRUCTION OF NOSE  2019   TOTAL KNEE ARTHROPLASTY Left 07/20/2023   Procedure: ARTHROPLASTY, KNEE, TOTAL;  Surgeon: Edna Toribio LABOR, MD;  Location: [facility];  Service: Orthopedics;  Laterality: Left;   WISDOM TOOTH EXTRACTION  2012   Patient Active Problem List   Diagnosis  Date Noted   Intertrigo 10/19/2023   Hydronephrosis 10/14/2023   AKI (acute kidney injury) (HCC) 09/27/2023   Hypersomnia 09/27/2023   Ileus, postoperative (HCC) 09/27/2023   Hydronephrosis of left kidney 09/27/2023   Malignant neoplasm of spine (HCC) 08/31/2023   Sacral lesion 08/04/2023   Urinary retention 08/03/2023   History of total knee arthroplasty, left 07/20/2023   Preoperative clearance 05/12/2023   COVID-19 03/30/2023   Restless leg syndrome 10/08/2022   Dermatitis 09/01/2022   Prediabetes 07/09/2021   BPH (benign prostatic hyperplasia) 06/12/2021   Elevated PSA 09/07/2020   Polyarthralgia 08/31/2020   Steatohepatitis 05/16/2020   Open fracture of left thumb 01/31/2020   GERD (gastroesophageal reflux disease) 11/07/2016   Primary osteoarthritis of right wrist 09/29/2016   Mood disorder (HCC) 12/17/2015   Left insertional Achilles tendinosis 11/05/2015   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   Obstructive sleep apnea on BiPAP with periodic limb movement disorder 06/05/2014   Obesity 05/02/2013   Annual physical exam 04/04/2013   Primary osteoarthritis of both knees with pseudogout 04/04/2013   Degenerative disc disease, cervical 04/04/2013   Hyperlipidemia 04/04/2013   Benign essential hypertension 04/04/2013   Lumbar degenerative disc disease 04/04/2013    PCP: Curtis Ned MD REFERRING PROVIDER: Edna Toribio, MD REFERRING DIAG:  Diagnosis  [phone] (ICD-10-CM) - S/P total knee replacement   THERAPY DIAG:  Acute pain of left knee  Other abnormalities of gait and mobility  Muscle weakness (generalized)  Rationale for Evaluation and Treatment: Rehabilitation  ONSET DATE: 07/20/23 surgical date  SUBJECTIVE:  SUBJECTIVE STATEMENT: The patient was last seen in PT on 6/12 and then readmitted to the hospital on 6/18. He reports stiffness in his knee. He also is limited in negotiating steps reciprocal pattern, but thinks it may be knee  stiffness + abdominal discomfort. I walk as much as I can.  Patient notes overall he is feeling down about the situation with his bladder and multiple hospitalizations. We've got to get one thing handled. I can't keep running this marathon.  The patient wants to continue PT because he cannot get his leg crossed to get shoe and sock on. He wants to do 1x/week to reduce stiffening and get full function back. I don't need for my leg to get forgotten.   EVAL:The patient reports both knees are shot having meniscus repair in the past. He expects pain and is ready to start rehab. He had 1.5 years of pain prior to surgery. His surgery was on 07/20/23 for L TKR and post surgical course was complicated by urinary retention-- he is using a foley cath at this time and has appt with urology next week. He is using ice for pain control and meds.   PERTINENT HISTORY: Covid, RLS, OSA on bipap, HTN, DDD  PAIN:  Are you having pain? Yes: NPRS scale: 1 Pain location: Lt knee Pain description: sore,ache,stiff  Aggravating factors: movement Relieving factors: pain meds, ice  PRECAUTIONS: None  WEIGHT BEARING RESTRICTIONS: WBAT  FALLS:  Has patient fallen in last 6 months? Yes, 3--felt balance was off (works on this at [employer])  LIVING ENVIRONMENT: Lives with: lives with their spouse Lives in: House/apartment Stairs: Yes: Internal: 17, basement steps; one rail and 2 walls and External: 2 steps; none Has following equipment at home: Walker - 2 wheeled  PATIENT GOALS: Good quality of life-- be able to do 7/8 of what I used to do.  NEXT MD VISIT: 10/01/23  OBJECTIVE:  Note: Objective measures were completed at Evaluation unless otherwise noted. PATIENT SURVEYS:  LEFS 10% COGNITION: Overall cognitive status: Within functional limits for tasks assessed   EDEMA:  Circumferential: 37 on R and 44 on L PALPATION: Edema L knee with dressing intact LOWER EXTREMITY ROM: Active ROM Left eval  Left 07/27/23 Left 08/03/23 08/13/23 Left  08/17/23 Left 08/20/23 Left  08/24/23 Left 08/26/23 09/18/23 Left 09/23/23 10/08/23 Left AROM 10/21/23 Left AROM  Hip flexion              Hip extension              Hip abduction              Hip adduction              Hip internal rotation              Hip external rotation              Knee flexion AROM 75 deg heel slide AAROM 94 deg AAROM 102 deg supine  Sitting AAROM 103 deg 90 AROM 104 AROM heel slide 105 110  111 PROM 108 AROM 112 degrees AAROM knee to chest 110 122 degrees  Knee extension -9 PROM -12 AROM  -6 deg supine   Lacking 4 in supine  2 degrees (from full extension) prone hang      Ankle dorsiflexion  Ankle plantarflexion              Ankle inversion              Ankle eversion               (Blank rows = not tested) LOWER EXTREMITY MMT: MMT Right eval Left eval Left 08/17/23 10/08/23 Left   Hip flexion    4- knee pain  Hip extension      Hip abduction      Hip adduction      Hip internal rotation      Hip external rotation      Knee flexion    4+  Knee extension  LAQ to -38 Extensor Lag -20 4  Ankle dorsiflexion      Ankle plantarflexion      Ankle inversion      Ankle eversion       (Blank rows = not tested) FUNCTIONAL TESTS:  Gait speed = 1.23 ft/sec GAIT: Distance walked: 100 ft Assistive device utilized: Environmental consultant - 2 wheeled Level of assistance: Modified independence Comments: stairs with RW since no rails at home with step to pattern and SBA for safety   [facility] Adult PT Treatment:                                                DATE: 10/21/23 Therapeutic Exercise: Supine Knee to chest physioball roll Rocking lumbar spine with ball  Bridges x 10 reps  Heel slide and knee to chest for end range measurements-- noted improvement in flexion Attempted L heel to R knee in hooklying-- unable Bent knee fallouts Hip adductor stretch R and L sides within tolerable ROM Gait: 20/10.97=1.82 ft/sec for gait  speed  200 feet ambulation at self chosen pace with good stride length Self Care: Discussed current HEP and recommended walking program as main focus to maintain mobility Also discussed that if any exercises increase abdominal pain, then discontinue at this time.   [facility] Adult PT Treatment:                                                DATE: 10/08/23 Therapeutic Exercise: ROM and MMT measurements Supine march 2 x 10  Seated HS curl 2 x 10 green band Standing calf raise 2 x 10  LAQ 2 x 10  Therapeutic Activity: Walking 1 lap in clinic  Sit to stand x 5, x 4 with UE support  Standing march 2 x 10  Self Care: HEP review recommended to d/c prone exercises and limit activity that will stress abdomen (SLR)  Initiate walking program at home   [facility] Adult PT Treatment:                                                DATE: 09/23/23 Therapeutic Exercise: Standing Knee flexion to end range with L foot on 12 step Step ups to 6 step with increased pain today Single leg heel raises Single leg balance dec'ing UE support L LE Loading through knee-- L leg on mat table with towel roll and  performing gentle rocking into flexion (beginning of quadriped position)-- unable to tolerate weightbearing in this position Prone Knee flexion AROM with passive overpressure by PT--fatigues after 8 reps Manual Therapy: STM L hamstring in prone position, supine adductors--much improved from last session due to patient performing self massage Gait: Gait with cues for maintaining L weight shift during mid stance x 400 ft in hallway of [facility] Gait speed=2.53 ft/sec   PATIENT EDUCATION:  Education details: see treatment Person educated: Patient,  Education method: Explanation,  Education comprehension: verbalized understanding,  HOME EXERCISE PROGRAM: Access Code: [email or web address] URL: [email or web address] Date: 09/18/2023 Prepared by: Tawni Ferrier  Exercises - Seated Long Arc Quad  - 1 x  daily - 7 x weekly - 2 sets - 10 reps - Supine Straight Leg Raises  - 1 x daily - 7 x weekly - 2 sets - 10 reps - Heel Raises with Counter Support  - 1 x daily - 7 x weekly - 2 sets - 12 reps - Standing Terminal Knee Extension with Resistance  - 1 x daily - 7 x weekly - 2 sets - 10 reps - Prone Knee Extension with Ankle Weight  - 1 x daily - 7 x weekly - 1 sets - max hold - Backwards Walking  - 1 x daily - 7 x weekly - 3 sets - 10 reps - Standing Hip Abduction with Counter Support  - 1 x daily - 7 x weekly - 2 sets - 10 reps - Standing Hip Extension with Counter Support  - 1 x daily - 7 x weekly - 2 sets - 10 reps - Tandem Stance  - 1 x daily - 7 x weekly - 3 sets - 30 sec  hold - Single Leg Stance  - 1 x daily - 7 x weekly - 3 sets - 30 sec  hold - Hip Adductors and Hamstring Stretch with Strap  - 1 x daily - 5 x weekly - 1 sets - 3 reps - 30 seconds hold - Standing Hamstring Stretch with Step  - 1 x daily - 5 x weekly - 1 sets - 3 reps - 30 seconds hold  ASSESSMENT:  CLINICAL IMPRESSION: The patient returns today after another hospitalization. His knee ROM is improved meeting LTG. His gait speed is slowed and overall, he feels stiffness in his knee and weakness. PT encouraging regular exercise for endurance and strength. Plan to continue at 1x/week per patient request for overall conditioning and to work on being able to donn socks. He is tight in adductors and hips limiting ability to donn socks.  EVAL: Patient is a 63 y.o. male who was seen today for physical therapy evaluation and treatment for L TKR on 07/20/23. He presents today with impairments in ROM, strength, edema, pain, and functional limitations s/p surgery. Patient's hospital course complicated by urinary retention. He is f/u in OP with urology. PT to progress patient to tolerance with goal of returning to prior functional status with reduced pain.    OBJECTIVE IMPAIRMENTS: Abnormal gait, decreased activity tolerance, decreased  balance, decreased endurance, decreased ROM, decreased strength, hypomobility, increased edema, increased fascial restrictions, impaired flexibility, and pain.   GOALS: Goals reviewed with patient? Yes UPDATED GOALS:  SHORT TERM GOALS: Target date: 10/23/23  The patient will be indep with HEP progression. Baseline: has HEP-- has been limited in performing this week due to other medical appts and procedures Goal status: UPDATED  2.   The patient will demonstrate reciprocal pattern with descending stairs.  Baseline:  step to pattern to descend/ able to ascend with reciprocal pattern. Goal status:UPDATED  LONG TERM GOALS: Target date: 11/22/23  The patient will return to gym routine for post d/c activities. Baseline: limited exercises at gym Goal status: UPDATED  2.  The patient will improve LEFS up to 50% to demonstrate improved functional abilities. Baseline: 42.5% ON 09/23/23 Goal status: UPDATED   3.  The patient will improve AROM L knee to 115 degrees Baseline:  112 degrees Goal status : MET  4.  The patient will normalize gait mechanics. Baseline: favors L LE and has antalgic gait upon rising from sitting Goal status: UPDATED   PLAN:  PT FREQUENCY: 2x/week x 4 weeks, 1x/week x 4 weeks (will reduce to 1x/week as soon as able)  PT DURATION: 8 total weeks  PLANNED INTERVENTIONS: 97164- PT Re-evaluation, 97110-Therapeutic exercises, 97530- Therapeutic activity, [id number]- Neuromuscular re-education, 97535- Self Care, 02859- Manual therapy, [id number]- Gait training, [phone]- Electrical stimulation (unattended), 97016- Vasopneumatic device, 97033- Ionotophoresis 4mg /ml Dexamethasone , Patient/Family education, Balance training, Stair training, Taping, Dry Needling, Joint mobilization, DME instructions, and Cryotherapy  PLAN FOR NEXT SESSION:  progress therex to tolerance, Gait training working on normalizing gait mechanics. Balance activity as tolerated.  Check STGs. Work on donning socks as  tolerated.  Kieryn Burtis, PT 10/21/23 3:45 PM

## 2023-10-22 ENCOUNTER — Encounter: Payer: Self-pay | Admitting: Sports Medicine

## 2023-10-22 ENCOUNTER — Telehealth: Payer: Self-pay | Admitting: *Deleted

## 2023-10-22 NOTE — Telephone Encounter (Signed)

## 2023-10-22 NOTE — Progress Notes (Signed)
 Complex Care Management Note  Care Guide Note 10/22/2023 Name: Brandon Riley MRN: [id number] DOB: 1960-12-01  Brandon Riley is a 63 y.o. year old male who sees Thekkekandam, Debby PARAS, MD for primary care. I reached out to Brandon Riley by phone today to offer complex care management services.  [name] was given information about Complex Care Management services today including:   The Complex Care Management services include support from the care team which includes your Nurse Care Manager, Clinical Social Worker, or Pharmacist.  The Complex Care Management team is here to help remove barriers to the health concerns and goals most important to you. Complex Care Management services are voluntary, and the patient may decline or stop services at any time by request to their care team member.   Complex Care Management Consent Status: Patient agreed to services and verbal consent obtained.   Follow up plan:  Telephone appointment with complex care management team member scheduled for:  10/23/2023  Encounter Outcome:  Patient Scheduled  Thedford Franks, CMA [place]  [facility], [facility] Guide Direct Dial: [phone]  Fax: [phone] Website: [email or web address]

## 2023-10-23 ENCOUNTER — Telehealth: Payer: Self-pay | Admitting: Sports Medicine

## 2023-10-23 ENCOUNTER — Other Ambulatory Visit: Payer: Self-pay

## 2023-10-23 DIAGNOSIS — F0631 Mood disorder due to known physiological condition with depressive features: Secondary | ICD-10-CM

## 2023-10-23 DIAGNOSIS — R188 Other ascites: Secondary | ICD-10-CM | POA: Insufficient documentation

## 2023-10-23 MED ORDER — DULOXETINE HCL 30 MG PO CPEP: 30.0000 mg | ORAL_CAPSULE | Freq: Every day | ORAL | 3 refills | Status: DC

## 2023-10-23 NOTE — Telephone Encounter (Signed)
 Brandon Riley has had extensive complaints regarding his abdomen, he is continuing to have left-sided abdominal pain, severe, he has been in the ED several times, CT from [facility] did show a perirenal collection extending caudad posteriorly.  A CT scan from [facility] showed enlarging of the collection. I am going to communicate with his urologists regarding potential IR drainage of this.  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

## 2023-10-23 NOTE — Assessment & Plan Note (Signed)
 Brandon Riley has had extensive complaints regarding his abdomen, he is continuing to have left-sided abdominal pain, severe, he has been in the ED several times, CT from [facility] did show a perirenal collection extending caudad posteriorly.  A CT scan from [facility] showed enlarging of the collection. I am going to communicate with his urologists regarding potential IR drainage of this.  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

## 2023-10-23 NOTE — Assessment & Plan Note (Signed)
 Brandon Riley had a prostatic enucleation, hydronephrosis, he has had multiple complications, episodes of urinary obstruction, multiple Foley catheter placements, was overall improving and then more recently has started to have increasing left-sided abdominal pain, a CT scan from the 18th of this month did show fluid collection behind and caudal to the left kidney.  He had an updated CT scan at [facility] on the 25th that showed the pararenal fluid collection had enlarged. This may represent abscess versus urinoma, considering increasing left-sided abdominal pain I will go and set him up with IR for consideration of drainage as well as analysis and culture. I have discussed this in a secure chat with urology.

## 2023-10-23 NOTE — Addendum Note (Signed)
 Addended by: CURTIS DEBBY PARAS on: 10/23/2023 09:08 AM   Modules accepted: Orders

## 2023-10-23 NOTE — Telephone Encounter (Signed)
 I have attempted to contact [facility] with interventional radiology, unfortunately was sent to voicemail. I have left a detailed voice message regarding the urgency of [name]'s situation. I have also requested a call back to our office at her earliest convenience.

## 2023-10-26 NOTE — Patient Instructions (Signed)
 Visit Information  Thank you for taking time to visit with me today. Please don't hesitate to contact me if I can be of assistance to you before our next scheduled telephone appointment.  Our next appointment is by telephone on 11/19/23 at 11:00 AM  Following is a copy of your care plan:   Goals Addressed             This Visit's Progress    VBCI RN Care Plan       Problems:  Chronic Disease Management support and education needs related to BPH (s/p prostatic enucleation), Hydronephrosis of Left Kidney, Multiple Post Op Complications including episodes of urinary obstruction, and Persistent complaints of Pain (left-sided abdominal & flank pain).  Goal: Over the next 6 months the Patient will continue to work with RN Care Manager and/or Social Worker to address care management and care coordination needs related to Pain associated with Hydronephrosis of Left Kidney & Multiple Post Op Complications as well as Depression d/t physical illness  as evidenced by adherence to care management team scheduled appointments      Interventions:   Pain Interventions: Pain assessment performed Medications reviewed Reviewed provider established plan for pain management Discussed importance of adherence to all scheduled medical appointments Counseled on the importance of reporting any/all new or changed pain symptoms or management strategies to pain management provider Advised patient to report to care team affect of pain on daily activities Discussed use of relaxation techniques and/or diversional activities to assist with pain reduction (distraction, imagery, relaxation, massage, acupressure, TENS, heat, and cold application Reviewed with patient prescribed pharmacological and nonpharmacological pain relief strategies Screening for signs and symptoms of depression related to chronic disease state  Assessed social determinant of health barriers  Patient Self-Care Activities:  Attend all scheduled  provider appointments Call provider office for new concerns or questions  Perform all self care activities independently  Perform IADL's (shopping, preparing meals, housekeeping, managing finances) independently Take medications as prescribed   Work with the social worker to address care coordination needs and will continue to work with the clinical team to address health care and disease management related needs  Plan:  The patient has been provided with contact information for the care management team and has been advised to call with any health related questions or concerns.              Patient verbalizes understanding of instructions and care plan provided today and agrees to view in MyChart. Active MyChart status and patient understanding of how to access instructions and care plan via MyChart confirmed with patient.     The patient has been provided with contact information for the care management team and has been advised to call with any health related questions or concerns.   Please call the care guide team at [phone] if you need to cancel or reschedule your appointment.   Please call 1-800-273-TALK (toll free, 24 hour hotline) if you are experiencing a Mental Health or Behavioral Health Crisis or need someone to talk to.  Faizan Geraci A. Gordy RN, BA, [facility], CRRN [place]  [facility] Population Health RN Care Manager Direct Dial: [phone]  Fax: [phone]

## 2023-10-26 NOTE — Patient Outreach (Signed)
 Complex Care Management   Visit Note  10/23/2023  Name:  Brandon Riley MRN: [id number] DOB: [birth year]  Situation: Referral received for Complex Care Management related to Urinary Obstruction/Hydronephrosis of Left Kidney and Acute Pain. I obtained verbal consent from Patient.  Visit completed with Patient  on the phone  Background:   Past Medical History:  Diagnosis Date   Arthritis    BPH (benign prostatic hyperplasia)    Complication of anesthesia    epidural did not work ended up with general and now trouble w/ urinatiing needing laser enucleation of prastate   GERD (gastroesophageal reflux disease)    Hyperlipemia    Hypertension    OSA on CPAP    Restless leg syndrome     Assessment: Patient Reported Symptoms:  Cognitive Cognitive Status: Normal speech and language skills, Insightful and able to interpret abstract concepts, Alert and oriented to person, place, and time Cognitive/Intellectual Conditions Management [RPT]: None reported or documented in medical history or problem list   Health Maintenance Behaviors: Annual physical exam Healing Pattern: Unsure Health Facilitated by: Stress management, Pain control, Rest  Neurological Neurological Review of Symptoms: No symptoms reported    HEENT HEENT Symptoms Reported: No symptoms reported      Cardiovascular Cardiovascular Symptoms Reported: No symptoms reported Does patient have uncontrolled Hypertension?: No (hypertension fairly well controlled with medications, as pain increases, BP generally increases as well) Is patient checking Blood Pressure at home?: Yes Patient's Recent BP reading at home: recent reading 128/88 Cardiovascular Management Strategies: Medication therapy, Coping strategies, Routine screening Weight: 217 lb (98.4 kg) Cardiovascular Self-Management Outcome: 4 (good)  Respiratory Respiratory Symptoms Reported: No symptoms reported    Endocrine Endocrine Symptoms Reported: No symptoms reported Is  patient diabetic?: No    Gastrointestinal Gastrointestinal Symptoms Reported: Abdominal pain or discomfort, Cramping Gastrointestinal Management Strategies: Medication therapy Gastrointestinal Self-Management Outcome: 3 (uncertain) Gastrointestinal Comment: Left sided abdominal pain seems to be associated with fluid collection behind and caudal to L kidney (CT 6/18 revealed)    Genitourinary Genitourinary Symptoms Reported: Difficulty initiating stream, Other Additional Genitourinary Details: patient explained that he went for a routine L knee arthroplasty on 07/20/23 and had post procedure complications involving his urinary system - had severe urinary retention and had to go home with a foley, has had multiple ED visits and admissions since Genitourinary Management Strategies: Catheter, indwelling, Medication therapy Genitourinary Self-Management Outcome: 3 (uncertain)  Integumentary Integumentary Symptoms Reported: No symptoms reported    Musculoskeletal Musculoskelatal Symptoms Reviewed: Weakness Additional Musculoskeletal Details: uses a cane or walker when out Musculoskeletal Management Strategies: Medical device Musculoskeletal Self-Management Outcome: 3 (uncertain) Musculoskeletal Comment: Patient attributes increased debilitation with multiple health challenges since L knee arthroplasty in March with subsequent 6 ED visits and 2 additional Hospitalizations mainly involving severe pain and urinary obstruction      Psychosocial Psychosocial Symptoms Reported: Depression - if selected complete PHQ 2-9 Additional Psychological Details: Patient states he is depressed due to his health issues, remains very angry, upset and frustrated with his current health status, states Prior to this I was larger than life, states now he finds himself constantly in the Emergency Room or Hospital room. Behavioral Management Strategies: Coping strategies, Medication therapy Behavioral Health  Self-Management Outcome: 3 (uncertain)   Quality of Family Relationships: supportive, helpful, involved Do you feel physically threatened by others?: No      10/12/2023   11:14 AM  Depression screen PHQ 2/9  Decreased Interest 1  Down, Depressed, Hopeless 2  PHQ -  2 Score 3  Altered sleeping 1  Tired, decreased energy 3  Change in appetite 2  Feeling bad or failure about yourself  0  Trouble concentrating 1  Moving slowly or fidgety/restless 0  Suicidal thoughts 0  PHQ-9 Score 10  Difficult doing work/chores Somewhat difficult    There were no vitals filed for this visit.  Medications Reviewed Today     Reviewed by Gordy Channing LABOR, RN (Registered Nurse) on 10/23/23 at 1141  Med List Status: <None>   Medication Order Taking? Sig Documenting Provider Last Dose Status Informant  acetaminophen  (TYLENOL ) 500 MG tablet [id number] Yes Take 1,000 mg by mouth every 6 (six) hours as needed for mild pain (pain score 1-3) or moderate pain (pain score 4-6). [provider]  Active Self, Pharmacy Records  ALPRAZolam  (XANAX ) 0.5 MG tablet [id number] Yes Take 1 tablet (0.5 mg total) by mouth 2 (two) times daily as needed for anxiety. Curtis Debby PARAS, MD  Active Self, Pharmacy Records           Med Note (CRUTHIS, CHLOE C   Wed Oct 14, 2023  7:10 AM)    Ascorbic Acid (VITAMIN C PO) [id number] Yes Take 2 tablets by mouth every other day. [provider]  Active Self, Pharmacy Records  CHERRY PO [id number] Yes Take 250 mg by mouth in the morning, at noon, and at bedtime. [provider]  Active Self, Pharmacy Records  DULoxetine (CYMBALTA) 30 MG capsule [id number] Yes Take 1 capsule (30 mg total) by mouth daily. Thekkekandam, Thomas J, MD  Active   metoprolol  tartrate (LOPRESSOR ) 25 MG tablet [id number] Yes Take 1 tablet (25 mg total) by mouth 2 (two) times daily. Drusilla Sabas RAMAN, MD  Active Self, Pharmacy Records  Nystatin  POWD [id number] Yes Apply liberally to affected  area 2 times per day Curtis Debby PARAS, MD  Active   pantoprazole  (PROTONIX ) 40 MG tablet [id number] Yes Take 1 tablet (40 mg total) by mouth daily. Curtis Debby PARAS, MD  Active   phenazopyridine  (PYRIDIUM ) 200 MG tablet [id number]  Take 1 tablet (200 mg total) by mouth 3 (three) times daily as needed for pain. Stoneking, Adine PARAS., MD  Active Self, Pharmacy Records  polyethylene glycol (MIRALAX  / GLYCOLAX ) 17 g packet [id number]  Take 17 g by mouth daily as needed for up to 28 days for moderate constipation.  Patient taking differently: Take 17 g by mouth daily.   Drusilla Sabas RAMAN, MD  Active Self, Pharmacy Records  rOPINIRole  (REQUIP ) 1 MG tablet [id number] Yes 1-4 tabs PO qHS  Patient taking differently: Take 3 mg by mouth at bedtime.   Curtis Debby PARAS, MD  Active Self, Pharmacy Records  rosuvastatin  (CRESTOR ) 10 MG tablet [id number] Yes TAKE ONE TABLET BY MOUTH DAILY  Patient taking differently: Take 10 mg by mouth every Monday, Wednesday, and Friday.   Curtis Debby PARAS, MD  Active Self, Pharmacy Records  senna [facility]) 8.6 MG TABS tablet [id number] Yes Take 2 tablets (17.2 mg total) by mouth daily as needed for moderate constipation.  Patient taking differently: Take 1 tablet by mouth daily as needed for moderate constipation.   Drusilla Sabas RAMAN, MD  Active Self, Pharmacy Records            Recommendation:   As scheduled with Dr. Curtis for 11/18/23 @ 9:15 am, PCP Follow-up Specialty provider follow-up 11/17/18 @11  am with Dr. Francisca regarding Hydronephrosis  Follow Up Plan:   Telephone follow up appointment date/time:  LCSW Alm  Hewitt on 11/02/23 @ 11am and Telephone follow up appointment with RN Case manager Nedim Oki L on 11/19/23 @ 11am   Channing A. Gordy RN, BA, [facility], CRRN [place]  [facility] Population Health RN Care Manager Direct Dial: [phone]  Fax: [phone]

## 2023-10-27 ENCOUNTER — Encounter: Payer: Self-pay | Admitting: Sports Medicine

## 2023-10-27 ENCOUNTER — Other Ambulatory Visit: Payer: Self-pay | Admitting: Sports Medicine

## 2023-10-27 DIAGNOSIS — G2581 Restless legs syndrome: Secondary | ICD-10-CM

## 2023-10-27 NOTE — Telephone Encounter (Signed)
 Interventional radiology [facility] imaging) has still not contacted him about potentially draining the collection.

## 2023-10-28 ENCOUNTER — Other Ambulatory Visit: Payer: Self-pay | Admitting: Student

## 2023-10-28 ENCOUNTER — Telehealth (HOSPITAL_COMMUNITY): Payer: Self-pay

## 2023-10-28 DIAGNOSIS — R188 Other ascites: Secondary | ICD-10-CM

## 2023-10-28 NOTE — Progress Notes (Signed)
 Patient for CT guided LT retroperitoneal collection on Thurs 10/29/23, I called and spoke with the patient on the phone and gave pre-procedure instructions. Pt was made aware to be here at 9:30a, NPO after MN prior to procedure as well as driver post procedure/recovery/discharge. Pt stated understanding.  Called 10/28/23

## 2023-10-28 NOTE — Telephone Encounter (Signed)
-----   Message from Dayne Daniel Hassell sent at 10/28/2023  7:44 AM EDT ----- Regarding: RE: Robie Belton CT drain L retroperitoneal collection + sedation  DDH ----- Message ----- From: Carolee Rosina BIRCH Sent: 10/27/2023   4:37 PM EDT To: Ir Procedure Requests Subject: Drain                                           Procedure: Drainage of fluid collection  Dx: Enlarging left perinephric fluid collection with abdominal pain, need drainage with fluid analysis and cultures  Ordering: Dr. Debby Petties [phone]  Imaging: CT done 6/18 in epic  Please review.  Thanks, Erwin

## 2023-10-29 ENCOUNTER — Other Ambulatory Visit: Payer: Self-pay | Admitting: Urology

## 2023-10-29 ENCOUNTER — Other Ambulatory Visit: Payer: Self-pay

## 2023-10-29 ENCOUNTER — Other Ambulatory Visit: Payer: Self-pay | Admitting: Radiology

## 2023-10-29 ENCOUNTER — Telehealth: Payer: Self-pay | Admitting: Sports Medicine

## 2023-10-29 ENCOUNTER — Encounter: Payer: Self-pay | Admitting: Sports Medicine

## 2023-10-29 ENCOUNTER — Ambulatory Visit
Admission: RE | Admit: 2023-10-29 | Discharge: 2023-10-29 | Disposition: A | Discharge status: Home / Self Care | Source: Ambulatory Visit | Attending: Sports Medicine | Admitting: Sports Medicine

## 2023-10-29 VITALS — BP 121/83 | HR 81 | Temp 98.0°F | Resp 19

## 2023-10-29 DIAGNOSIS — R188 Other ascites: Secondary | ICD-10-CM | POA: Insufficient documentation

## 2023-10-29 DIAGNOSIS — K6819 Other retroperitoneal abscess: Secondary | ICD-10-CM | POA: Diagnosis not present

## 2023-10-29 LAB — CBC
HCT: 34.5 % — ABNORMAL LOW (ref 39.0–52.0)
Hemoglobin: 11.4 g/dL — ABNORMAL LOW (ref 13.0–17.0)
MCH: 28.7 pg (ref 26.0–34.0)
MCHC: 33 g/dL (ref 30.0–36.0)
MCV: 86.9 fL (ref 80.0–100.0)
Platelets: 287 10*3/uL (ref 150–400)
RBC: 3.97 MIL/uL — ABNORMAL LOW (ref 4.22–5.81)
RDW: 13.2 % (ref 11.5–15.5)
WBC: 6.3 10*3/uL (ref 4.0–10.5)
nRBC: 0 % (ref 0.0–0.2)

## 2023-10-29 LAB — PROTIME-INR
INR: 1.2 (ref 0.8–1.2)
Prothrombin Time: 15.5 s — ABNORMAL HIGH (ref 11.4–15.2)

## 2023-10-29 MED ORDER — SODIUM CHLORIDE 0.9 % IV SOLN: INTRAVENOUS | Status: DC

## 2023-10-29 MED ORDER — FENTANYL CITRATE (PF) 100 MCG/2ML IJ SOLN
INTRAMUSCULAR | Status: AC | PRN
  Administered 2023-10-29 (×3): 25 ug via INTRAVENOUS
  Administered 2023-10-29: 50 ug via INTRAVENOUS

## 2023-10-29 MED ORDER — SODIUM CHLORIDE 0.9% FLUSH: 5.0000 mL | Freq: Three times a day (TID) | INTRAVENOUS | Status: DC

## 2023-10-29 MED ORDER — MIDAZOLAM HCL 2 MG/2ML IJ SOLN
INTRAMUSCULAR | Status: AC
  Filled 2023-10-29: qty 4

## 2023-10-29 MED ORDER — FENTANYL CITRATE (PF) 100 MCG/2ML IJ SOLN
INTRAMUSCULAR | Status: AC
  Filled 2023-10-29: qty 4

## 2023-10-29 MED ORDER — MIDAZOLAM HCL 2 MG/2ML IJ SOLN
INTRAMUSCULAR | Status: AC | PRN
  Administered 2023-10-29: 1 mg via INTRAVENOUS
  Administered 2023-10-29 (×3): .5 mg via INTRAVENOUS

## 2023-10-29 NOTE — Procedures (Signed)
 Pre procedural Dx: left retroperitoneal fluid collection Post procedural Dx: Same  Technically successful CT guided placed of a 10 Fr drainage catheter placement into the collection yielding clear fluid.    A representative aspirated sample was capped and sent to the laboratory for analysis.    EBL: Trace Complications: None immediate  Brandon Riley Banner, MD Pager #: [phone]

## 2023-10-29 NOTE — H&P (Signed)
 Chief Complaint: Patient was seen in consultation today for intra-abdominal fluid collection   Procedure: Percutaneous drain placement  Referring Physician(s): Curtis Debby PARAS  Supervising Physician: Jenna Hacker  Patient Status: [facility] - Out-pt  History of Present Illness: Brandon Riley is a 63 y.o. male with a history of BPH with obstruction s/p HoLEP on 09/25/23 with [facility] who has recently presented to the ED multiple times with complaints of abdominal pain and hematuria. CT scan on 6/17 significant for moderate hydronephrosis with obstructing distal ureteral calculi. Repeat scan on 6/25 showing no obstructing stone, but notable for increased perinephric fluid collection measuring 7.1 x 3.8 cm. He was offered transfer to [facility] for continuation of care with Urology; however, patient elected to be discharged and follow up with [facility] outpatient. Per chart, he followed up via telephone with his PCP where he was noted to have continued, severe left-sided abdominal pain. He was then referred to IR for possible drainage of intra-abdominal fluid collection.   Unfortunately, patient did not have a clear understanding of the procedure and was not aware that he would be leaving with a drain in place. Dr. Jenna had a long discussion with patient and wife at the bedside regarding the procedure and his recommendations for drain placement. After much discussion, patient and wife are both agreeable to have drain placed today. Patient states that he has been having intermittent sharp pains to his left abdomen. He has also been experiencing intermittent chills and nausea, but denies any fevers or episodes of emesis. Denies any chest pain, shortness of breath, or changes in bowel/bladder habits. NPO since midnight. All questions and concerns answered at the bedside.   Code Status: Full Code  Past Medical History:  Diagnosis Date   Arthritis    BPH (benign prostatic  hyperplasia)    Complication of anesthesia    epidural did not work ended up with general and now trouble w/ urinatiing needing laser enucleation of prastate   GERD (gastroesophageal reflux disease)    Hyperlipemia    Hypertension    OSA on CPAP    Restless leg syndrome     Past Surgical History:  Procedure Laterality Date   CHONDROPLASTY Left 10/28/2018   Procedure: CHONDROPLASTY;  Surgeon: Cristy Bonner DASEN, MD;  Location: [facility];  Service: Orthopedics;  Laterality: Left;   HOLEP-LASER ENUCLEATION OF THE PROSTATE WITH MORCELLATION N/A 09/25/2023   Procedure: ENUCLEATION, PROSTATE, USING LASER, WITH MORCELLATION;  Surgeon: Francisca Redell BROCKS, MD;  Location: [facility];  Service: Urology;  Laterality: N/A;   KNEE ARTHROSCOPY WITH MEDIAL MENISECTOMY Left 10/28/2018   Procedure: LEFT KNEE ARTHROSCOPY WITH MEDIAL MENISECTOMY;  Surgeon: Cristy Bonner DASEN, MD;  Location: [facility];  Service: Orthopedics;  Laterality: Left;   RECONSTRUCTION OF NOSE  2019   TOTAL KNEE ARTHROPLASTY Left 07/20/2023   Procedure: ARTHROPLASTY, KNEE, TOTAL;  Surgeon: Edna Toribio LABOR, MD;  Location: [facility];  Service: Orthopedics;  Laterality: Left;   WISDOM TOOTH EXTRACTION  2012    Allergies: Celebrex [celecoxib], Flexeril [cyclobenzaprine], and Iodinated contrast media  Medications: Prior to Admission medications   Medication Sig Start Date End Date Taking? Authorizing Provider  acetaminophen  (TYLENOL ) 500 MG tablet Take 1,000 mg by mouth every 6 (six) hours as needed for mild pain (pain score 1-3) or moderate pain (pain score 4-6).   Yes [provider]  ALPRAZolam  (XANAX ) 0.5 MG tablet Take 1 tablet (0.5 mg total) by mouth 2 (two) times daily  as needed for anxiety. 08/25/23  Yes Curtis Debby PARAS, MD  Ascorbic Acid (VITAMIN C PO) Take 2 tablets by mouth every other day.   Yes [provider]  CHERRY PO Take 250 mg by mouth in the morning, at noon, and at bedtime.    Yes [provider]  docusate sodium  (COLACE) 250 MG capsule Take 250 mg by mouth daily.   Yes [provider]  DULoxetine (CYMBALTA) 30 MG capsule Take 1 capsule (30 mg total) by mouth daily. 10/23/23  Yes Curtis Debby PARAS, MD  metoprolol  tartrate (LOPRESSOR ) 25 MG tablet Take 1 tablet (25 mg total) by mouth 2 (two) times daily. 10/01/23 09/30/24 Yes Drusilla Sabas RAMAN, MD  pantoprazole  (PROTONIX ) 40 MG tablet Take 1 tablet (40 mg total) by mouth daily. 10/19/23  Yes Curtis Debby PARAS, MD  rOPINIRole  (REQUIP ) 1 MG tablet Take 3 tablets (3 mg total) by mouth at bedtime. 10/27/23  Yes Curtis Debby PARAS, MD  rosuvastatin  (CRESTOR ) 10 MG tablet TAKE ONE TABLET BY MOUTH DAILY Patient taking differently: Take 10 mg by mouth every Monday, Wednesday, and Friday. 09/21/22  Yes Curtis Debby PARAS, MD  senna (SENOKOT) 8.6 MG TABS tablet Take 2 tablets (17.2 mg total) by mouth daily as needed for moderate constipation. 10/01/23  Yes Drusilla Sabas RAMAN, MD  Nystatin  POWD Apply liberally to affected area 2 times per day 10/19/23   Curtis Debby PARAS, MD  phenazopyridine  (PYRIDIUM ) 200 MG tablet Take 1 tablet (200 mg total) by mouth 3 (three) times daily as needed for pain. Patient not taking: Reported on 10/29/2023 10/07/23   Roseann Adine PARAS., MD  polyethylene glycol (MIRALAX  / GLYCOLAX ) 17 g packet Take 17 g by mouth daily as needed for up to 28 days for moderate constipation. Patient taking differently: Take 17 g by mouth daily. 10/01/23 10/29/23  Drusilla Sabas RAMAN, MD     Family History  Problem Relation Age of Onset   Dementia Mother    Heart Problems Mother        Double bypass   Diabetes Mother    Other Father        Prostate Issues    Social History   Socioeconomic History   Marital status: Married    Spouse name: Not on file   Number of children: Not on file   Years of education: Not on file   Highest education level: Associate degree: occupational, Scientist, product/process development, or vocational  program  Occupational History   Not on file  Tobacco Use   Smoking status: Never    Passive exposure: Never   Smokeless tobacco: Never  Vaping Use   Vaping status: Never Used  Substance and Sexual Activity   Alcohol  use: Not Currently    Comment: 2-3x per week   Drug use: Never   Sexual activity: Yes    Partners: Female    Comment: MARRIED  Other Topics Concern   Not on file  Social History Narrative   Are you right handed or left handed?  Right Handed   Are you currently employed ?Retired   What is your current occupation?   Do you live at home alone? No   Who lives with you? Wife and animals   What type of home do you live in: 1 story or 2 story? One story with a basement.       Social Drivers of Health   Financial Resource Strain: Low Risk  (05/11/2023)   Overall Financial Resource Strain (CARDIA)  Difficulty of Paying Living Expenses: Not hard at all  Food Insecurity: No Food Insecurity (10/14/2023)   Hunger Vital Sign    Worried About Running Out of Food in the Last Year: Never true    Ran Out of Food in the Last Year: Never true  Transportation Needs: No Transportation Needs (10/14/2023)   PRAPARE - Administrator, Civil Service (Medical): No    Lack of Transportation (Non-Medical): No  Physical Activity: Sufficiently Active (05/11/2023)   Exercise Vital Sign    Days of Exercise per Week: 5 days    Minutes of Exercise per Session: 130 min  Stress: No Stress Concern Present (05/11/2023)   [employer] of Occupational Health - Occupational Stress Questionnaire    Feeling of Stress : Only a little  Social Connections: Moderately Isolated (05/11/2023)   Social Connection and Isolation Panel    Frequency of Communication with Friends and Family: More than three times a week    Frequency of Social Gatherings with Friends and Family: More than three times a week    Attends Religious Services: Never    Database administrator or Organizations: No     Attends Engineer, structural: Not on file    Marital Status: Married    Review of Systems  Constitutional:  Positive for chills.  Gastrointestinal:  Positive for abdominal pain (worst in the LUQ) and nausea.  Denies any vomiting, chest pain, shortness of breath, fevers. All other ROS negative.  Vital Signs: BP (!) 153/93   Pulse 78   Temp 97.6 F (36.4 C) (Oral)   Resp 15   SpO2 96%    Physical Exam Vitals reviewed.  Constitutional:      Appearance: Normal appearance.  HENT:     Head: Normocephalic and atraumatic.     Mouth/Throat:     Mouth: Mucous membranes are moist.     Pharynx: Oropharynx is clear.  Cardiovascular:     Rate and Rhythm: Normal rate and regular rhythm.     Heart sounds: Normal heart sounds.  Pulmonary:     Effort: Pulmonary effort is normal.     Breath sounds: Normal breath sounds.  Abdominal:     General: Abdomen is flat.     Palpations: Abdomen is soft.     Tenderness: There is abdominal tenderness (exquisitely tender to the LUQ).  Musculoskeletal:        General: Normal range of motion.     Cervical back: Normal range of motion.  Skin:    General: Skin is warm and dry.  Neurological:     General: No focal deficit present.     Mental Status: He is alert and oriented to person, place, and time.  Psychiatric:        Mood and Affect: Mood normal.        Behavior: Behavior normal.        Judgment: Judgment normal.     Imaging: DG Abd 2 Views Result Date: 10/14/2023 CLINICAL DATA:  Ileus, left-sided abdominal pain. Abdominal distension. EXAM: ABDOMEN - 2 VIEW COMPARISON:  09/29/2023. FINDINGS: There is some residual mild gaseous distention of small bowel with scattered gas and stool in the colon. Overall, appearance is improved from 09/29/2023. Probable small left pleural effusion. IMPRESSION: 1. Improving ileus without complete resolution. 2. Small left pleural effusion. Electronically Signed   By: Newell Eke M.D.   On: 10/14/2023  13:06   CT ABDOMEN PELVIS WO CONTRAST Result Date: 10/14/2023 CLINICAL DATA:  Hydronephrosis. EXAM: CT ABDOMEN AND PELVIS WITHOUT CONTRAST TECHNIQUE: Multidetector CT imaging of the abdomen and pelvis was performed following the standard protocol without IV contrast. RADIATION DOSE REDUCTION: This exam was performed according to the departmental dose-optimization program which includes automated exposure control, adjustment of the mA and/or kV according to patient size and/or use of iterative reconstruction technique. COMPARISON:  09/27/2023. FINDINGS: Lower chest: Slight increase in collapse/consolidation seen dependently in the left lower lobe. Subsegmental atelectasis in the inferior right lower lobe. Trace left pleural effusion. Heart is at the upper limits of normal in size to mildly enlarged. No pericardial effusion. Distal esophagus is grossly unremarkable. Hepatobiliary: Liver is decreased in attenuation diffusely and is enlarged, 18.7 cm. Liver and gallbladder are otherwise grossly unremarkable. No biliary ductal dilatation. Pancreas: Negative. Spleen: Negative. Adrenals/Urinary Tract: Adrenal glands are unremarkable. Low-attenuation lesions in the kidneys. No specific follow-up necessary. Left renal edema with moderate to severe hydronephrosis, increased from 09/27/2023. No obstructing stone. Ureter is dilated to the level of the ureteral vesicle junction. Bladder wall appears slightly thickened but the bladder is under distended, with a Foley catheter in place. Small amount of air in the bladder is related to the Foley catheter. Stomach/Bowel: Stomach, small bowel and appendix are unremarkable. Moderate stool burden. Colon is otherwise unremarkable. Vascular/Lymphatic: Atherosclerotic calcification of the aorta. No pathologically enlarged lymph nodes. 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 Other: Minimal presacral edema. Loculated ascites in the left upper quadrant and  left abdomen, new. Musculoskeletal: Degenerative changes in the spine. IMPRESSION: 1. Moderate to severe left hydronephrosis, increased from 09/27/2023 and possibly due to bladder wall thickening and/or recent prostate laser procedure. Please correlate clinically. No obstructing stone. 2. New small loculated ascites in left upper quadrant abdomen. 3. Slight increase in collapse/consolidation in the left lower lobe, likely due to atelectasis. Associated tiny left pleural effusion. 4. Steatotic enlarged liver. 5. Moderate stool burden. 6.  Aortic atherosclerosis (ICD10-I70.0). Electronically Signed   By: Newell Eke M.D.   On: 10/14/2023 13:04    Labs:  CBC: Recent Labs    10/01/23 0525 10/08/23 0952 10/14/23 0406 10/29/23 1027  WBC 10.7* 11.0* 6.8 6.3  HGB 12.3* 12.0* 11.4* 11.4*  HCT 36.4* 38.4 35.9* 34.5*  PLT 200 415 338 287    COAGS: No results for input(s): INR, APTT in the last 8760 hours.  BMP: Recent Labs    09/29/23 0521 09/30/23 0527 10/01/23 0525 10/08/23 0952 10/14/23 0406  NA 132* 130* 129* 134 135  K 3.6 3.5 3.7 5.0 4.1  CL 102 97* 100 98 102  CO2 18* 20* 20* 19* 21*  GLUCOSE 134* 123* 116* 94 101*  BUN 19 19 19 19 19   CALCIUM  8.6* 8.5* 8.4* 9.0 8.7*  CREATININE 1.48* 1.51* 1.36* 1.56* 1.36*  GFRNONAA 53* 52* 59*  --  59*    LIVER FUNCTION TESTS: Recent Labs    09/27/23 1043 09/28/23 0443 10/01/23 0525 10/08/23 0952  BILITOT 2.0* 2.1* 1.2 0.4  AST 22 20 15 21   ALT 20 21 18 27   ALKPHOS 60 56 62 89  PROT 7.1 6.4* 6.5 7.0  ALBUMIN 3.6 3.0* 2.6* 3.6*    TUMOR MARKERS: No results for input(s): AFPTM, CEA, CA199, CHROMGRNA in the last 8760 hours.  Assessment and Plan:  Perinephric Fluid Collection: Brandon Riley is a 63 y.o. male with a history of BPH s/p HoLEP who has recently had persistent left sided abdominal pain with intermittent hematuria. CT scans notable for  left intra-abdominal fluid collection. He presents today to [facility]  Interventional Radiology department for an image-guided percutaneous drain placement with Dr. KANDICE Banner. Procedure to be performed under moderate sedation.  Risks and benefits discussed with the patient including bleeding, infection, damage to adjacent structures, bowel perforation/fistula connection, and sepsis.  All of the patient's questions were answered, patient is agreeable to proceed. Consent signed and in chart.   Thank you for this interesting consult. I greatly enjoyed meeting Brandon Riley and look forward to participating in their care. A copy of this report was sent to the requesting provider on this date.  Electronically Signed: Jacoby Ritsema M Joceline Hinchcliff, PA-C 10/29/2023, 10:47 AM   I spent a total of  60 Minutes in face to face clinical consultation, greater than 50% of which was counseling/coordinating care for percutaneous drain placement.

## 2023-10-29 NOTE — Discharge Instructions (Addendum)
 Interventional Radiology Percutaneous Abscess Drain Placement After Care  This sheet gives you information about how to care for yourself after your procedure. Your health care provider may also give you more specific instructions. Your drain was placed by an interventional radiologist with [facility]. If you have questions or concerns, contact [facility] at [phone]. What is a percutaneous drain? A drain is a small plastic tube (catheter) that goes into the fluid collection in your body through your skin. How long will I need the drain? How long the drain needs to stay in is determined by where the drain is, how much comes out of the drain each day and if you are having any other surgical procedures. Interventional radiology will determine when it is time to remove the drain. It is important to follow up as directed so that the drain can be removed as soon as it is safe to do so. What can I expect after the procedure? After the procedure, it is common to have: A small amount of bruising and discomfort in the area where the drainage tube (catheter) was placed. Sleepiness and fatigue. This should go away after the medicines you were given have worn off. Follow these instructions at home: Insertion site care Check your insertion site when you change the bandage. Check for: More redness, swelling, or pain. More fluid or blood. Warmth. Pus or a bad smell. When caring for your insertion site: Wash your hands with soap and water  for at least 20 seconds before and after you change your bandage (dressing). If soap and water  are not available, use hand sanitizer. You do not need to change your dressing everyday if it is clean and dry. Change your dressing every 3 days or as needed when it is soiled, wet or becoming dislodged. You will need to change your dressing each time you shower. Leave stitches (sutures), skin glue, or adhesive strips in place. These skin closures may need  to stay in place for 2 weeks or longer. If adhesive strip edges start to loosen and curl up, you may trim the loose edges. Do not remove adhesive strips completely unless your health care provider tells you to do so.  Catheter care If you have a bulb please be sure the bulb is charged after reconnecting it - to do this pinch the bulb between your thumb and first finger and close the stopper located on the top of the bulb.   Check for fluid leaking from around your catheter (instead of fluid draining through your catheter). This may be a sign that the drain is no longer working correctly. Write down the following information every time you empty your bag: The date and time. The amount of drainage. Activity Rest at home for 1-2 days after your procedure. For the first 48 hours do not lift anything more than 10 lbs (about a gallon of milk). You may perform moderate activities/exercise. Please avoid strenuous activities during this time. Avoid any activities which may pull on your drain as this can cause your drain to become dislodged. If you were given a sedative during the procedure, it can affect you for several hours. Do not drive or operate machinery until your health care provider says that it is safe. General instructions For mild pain take over-the-counter medications as needed for pain such as Tylenol  or Advil. If you are experiencing severe pain please call our office as this may indicate an issue with your drain.  If you were prescribed an antibiotic  medicine, take it as told by your health care provider. Do not stop using the antibiotic even if you start to feel better. You may shower 24 hours after the drain is placed. To do this cover the insertion site with a water  tight material such as saran wrap and seal the edges with tape, you may also purchase waterproof dressings at your local drug store. Shower as usual and then remove the water  tight dressing and any gauze/tape underneath it once  you have exited the shower and dried off. Allow the area to air dry or pat dry with a clean towel. Once the skin is completely dry place a new gauze dressing. It is important to keep the site dry at all times to prevent infection. Do not submerge the drain - this means you cannot take baths, swim, use a hot tub, etc. until the drain is removed.  Do not use any products that contain nicotine or tobacco, such as cigarettes, e-cigarettes, and chewing tobacco. If you need help quitting, ask your health care provider. Keep all follow-up visits as told by your health care provider. This is important. Contact a health care provider if: You have less than 10 mL of drainage a day for 2-3 days in a row, or as directed by your health care provider. You have any of these signs of infection: More redness, swelling, or pain around your incision area. More fluid or blood coming from your incision area. Warmth coming from your incision area. Pus or a bad smell coming from your incision area. You have fluid leaking from around your catheter (instead of through your catheter). You are unable to flush the drain. You have a fever or chills. You have pain that does not get better with medicine. You have not been contacted to schedule a drain follow up appointment within 10 days of discharge from the hospital. Please call [facility] at [phone] with any questions or concerns. Get help right away if: Your catheter comes out. You suddenly stop having drainage from your catheter. You suddenly have blood in the fluid that is draining from your catheter. You become dizzy or you faint. You develop a rash. You have nausea or vomiting. You have difficulty breathing or you feel short of breath. You develop chest pain. You have problems with your speech or vision. You have trouble balancing or moving your arms or legs. Summary It is common to have a small amount of bruising and discomfort in the area  where the drainage tube (catheter) was placed. You may also have minor discomfort with movement while the drain is in place. Flush the drain once per day with 5 mL of 0.9% normal saline (unless you were told otherwise by your healthcare provider).  Record the amount of drainage from the bag every time you empty it. Change the dressing every 3 days or earlier if soiled/wet. Keep the skin dry under the dressing. You may shower with the drain in place. Do not submerge the drain (no baths, swimming, hot tubs, etc.). Contact [facility] at [phone] if you have more redness, swelling, or pain around your incision area or if you have pain that does not get better with medicine. This information is not intended to replace advice given to you by your health care provider. Make sure you discuss any questions you have with your health care provider. Document Revised: 07/18/2021 Document Reviewed: 04/09/2019 Elsevier Patient Education  2023 [employer]   Interventional Radiology Drain Record Empty  your drain at least once per day. You may empty it as often as needed. Use this form to write down the amount of fluid that has collected in the drainage container. Bring this form with you to your follow-up visits. Please call [facility] at [phone] with any questions or concerns prior to your appointment.  Drain #1 location: ___________________ Date __________ Time __________ Amount __________ Date __________ Time __________ Amount __________ Date __________ Time __________ Amount __________ Date __________ Time __________ Amount __________ Date __________ Time __________ Amount __________ Date __________ Time __________ Amount __________ Date __________ Time __________ Amount __________ Date __________ Time __________ Amount __________ Date __________ Time __________ Amount __________ Date __________ Time __________ Amount __________ Date __________ Time __________ Amount  __________ Date __________ Time __________ Amount __________ Date __________ Time __________ Amount __________ Date __________ Time __________ Amount __________  If you have multiple days with minimal drain output (<47mL), please feel free to call our clinic, [facility], at [phone] to schedule an earlier appointment.

## 2023-10-29 NOTE — Telephone Encounter (Signed)
 I performed the peer to peer today for the PET scan, it was approved, please schedule.  Approval: 10/08/23-04/26/24 auth# [id number].

## 2023-10-30 ENCOUNTER — Encounter: Payer: Self-pay | Admitting: Emergency Medicine

## 2023-10-30 ENCOUNTER — Ambulatory Visit: Admission: EM | Admit: 2023-10-30 | Discharge: 2023-10-30 | Disposition: A | Discharge status: Home / Self Care

## 2023-10-30 DIAGNOSIS — Z4801 Encounter for change or removal of surgical wound dressing: Secondary | ICD-10-CM | POA: Diagnosis not present

## 2023-10-30 NOTE — ED Triage Notes (Signed)
 Patient here for a wound check from a JP drain placed by IV radiology yesterday.  Patients wife unable to see where to change the bandage due to sutures.

## 2023-10-30 NOTE — ED Provider Notes (Signed)
 Brandon Riley CARE    CSN: [id number] Arrival date & time: 10/30/23  1536      History   Chief Complaint Chief Complaint  Patient presents with   Wound Check    HPI Brandon Riley is a 63 y.o. male.    Wound Check  Patient is status post interventional radiology procedure to have a JP drain placed.  Patient was to have the first surgical dressing removed on yesterday however wife attempted to remove the surgical dressing and was afraid that the suturing material was enmeshed with the dressing.  She called the interventional radiology after hours number advised her to come here to urgent care for evaluation of suturing material and to have dressing change.  Patient is not having any complication related to the drain placement.  Past Medical History:  Diagnosis Date   Arthritis    BPH (benign prostatic hyperplasia)    Complication of anesthesia    epidural did not work ended up with general and now trouble w/ urinatiing needing laser enucleation of prastate   GERD (gastroesophageal reflux disease)    Hyperlipemia    Hypertension    OSA on CPAP    Restless leg syndrome     Patient Active Problem List   Diagnosis Date Noted   Abdominal fluid collection 10/23/2023   Intertrigo 10/19/2023   Hydronephrosis 10/14/2023   AKI (acute kidney injury) (HCC) 09/27/2023   Hypersomnia 09/27/2023   Ileus, postoperative (HCC) 09/27/2023   Hydronephrosis of left kidney 09/27/2023   Malignant neoplasm of spine (HCC) 08/31/2023   Sacral lesion 08/04/2023   Urinary retention 08/03/2023   History of total knee arthroplasty, left 07/20/2023   Preoperative clearance 05/12/2023   COVID-19 03/30/2023   Restless leg syndrome 10/08/2022   Dermatitis 09/01/2022   Prediabetes 07/09/2021   BPH (benign prostatic hyperplasia) 06/12/2021   Elevated PSA 09/07/2020   Polyarthralgia 08/31/2020   Steatohepatitis 05/16/2020   Open fracture of left thumb 01/31/2020   GERD (gastroesophageal reflux  disease) 11/07/2016   Primary osteoarthritis of right wrist 09/29/2016   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   Left insertional Achilles tendinosis 11/05/2015   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   Obstructive sleep apnea on BiPAP with periodic limb movement disorder 06/05/2014   Obesity 05/02/2013   Annual physical exam 04/04/2013   Primary osteoarthritis of both knees with pseudogout 04/04/2013   Degenerative disc disease, cervical 04/04/2013   Hyperlipidemia 04/04/2013   Benign essential hypertension 04/04/2013   Lumbar degenerative disc disease 04/04/2013    Past Surgical History:  Procedure Laterality Date   CHONDROPLASTY Left 10/28/2018   Procedure: CHONDROPLASTY;  Surgeon: Cristy Bonner DASEN, MD;  Location: [facility];  Service: Orthopedics;  Laterality: Left;   HOLEP-LASER ENUCLEATION OF THE PROSTATE WITH MORCELLATION N/A 09/25/2023   Procedure: ENUCLEATION, PROSTATE, USING LASER, WITH MORCELLATION;  Surgeon: Francisca Redell BROCKS, MD;  Location: [facility];  Service: Urology;  Laterality: N/A;   KNEE ARTHROSCOPY WITH MEDIAL MENISECTOMY Left 10/28/2018   Procedure: LEFT KNEE ARTHROSCOPY WITH MEDIAL MENISECTOMY;  Surgeon: Cristy Bonner DASEN, MD;  Location: [facility];  Service: Orthopedics;  Laterality: Left;   RECONSTRUCTION OF NOSE  2019   TOTAL KNEE ARTHROPLASTY Left 07/20/2023   Procedure: ARTHROPLASTY, KNEE, TOTAL;  Surgeon: Edna Toribio LABOR, MD;  Location: [facility];  Service: Orthopedics;  Laterality: Left;   WISDOM TOOTH EXTRACTION  2012       Home Medications    Prior to Admission medications  Medication Sig Start Date End Date Taking? Authorizing Provider  acetaminophen  (TYLENOL ) 500 MG tablet Take 1,000 mg by mouth every 6 (six) hours as needed for mild pain (pain score 1-3) or moderate pain (pain score 4-6).   Yes [provider]  ALPRAZolam  (XANAX ) 0.5 MG tablet Take 1 tablet (0.5 mg total) by mouth 2 (two) times daily as  needed for anxiety. 08/25/23  Yes Curtis Debby PARAS, MD  Ascorbic Acid (VITAMIN C PO) Take 2 tablets by mouth every other day.   Yes [provider]  CHERRY PO Take 250 mg by mouth in the morning, at noon, and at bedtime.   Yes [provider]  docusate sodium  (COLACE) 250 MG capsule Take 250 mg by mouth daily.   Yes [provider]  DULoxetine  (CYMBALTA ) 30 MG capsule Take 1 capsule (30 mg total) by mouth daily. 10/23/23  Yes Curtis Debby PARAS, MD  metoprolol  tartrate (LOPRESSOR ) 25 MG tablet Take 1 tablet (25 mg total) by mouth 2 (two) times daily. 10/01/23 09/30/24 Yes Drusilla Sabas RAMAN, MD  Nystatin  POWD Apply liberally to affected area 2 times per day 10/19/23  Yes Curtis Debby PARAS, MD  pantoprazole  (PROTONIX ) 40 MG tablet Take 1 tablet (40 mg total) by mouth daily. 10/19/23  Yes Curtis Debby PARAS, MD  rOPINIRole  (REQUIP ) 1 MG tablet Take 3 tablets (3 mg total) by mouth at bedtime. 10/27/23  Yes Curtis Debby PARAS, MD  rosuvastatin  (CRESTOR ) 10 MG tablet TAKE ONE TABLET BY MOUTH DAILY Patient taking differently: Take 10 mg by mouth every Monday, Wednesday, and Friday. 09/21/22  Yes Curtis Debby PARAS, MD  senna (SENOKOT) 8.6 MG TABS tablet Take 2 tablets (17.2 mg total) by mouth daily as needed for moderate constipation. 10/01/23  Yes Drusilla Sabas RAMAN, MD  phenazopyridine  (PYRIDIUM ) 200 MG tablet Take 1 tablet (200 mg total) by mouth 3 (three) times daily as needed for pain. Patient not taking: Reported on 10/29/2023 10/07/23   Stoneking, Adine PARAS., MD    Family History Family History  Problem Relation Age of Onset   Dementia Mother    Heart Problems Mother        Double bypass   Diabetes Mother    Other Father        Prostate Issues    Social History Social History   Tobacco Use   Smoking status: Never    Passive exposure: Never   Smokeless tobacco: Never  Vaping Use   Vaping status: Never Used  Substance Use Topics   Alcohol  use: Not  Currently    Comment: 2-3x per week   Drug use: Never     Allergies   Celebrex [celecoxib], Flexeril [cyclobenzaprine], and Iodinated contrast media   Review of Systems Review of Systems   Physical Exam Triage Vital Signs ED Triage Vitals [10/30/23 1631]  Encounter Vitals Group     BP (!) 153/91     Girls Systolic BP Percentile      Girls Diastolic BP Percentile      Boys Systolic BP Percentile      Boys Diastolic BP Percentile      Pulse Rate 85     Resp 18     Temp 98.2 F (36.8 C)     Temp Source Oral     SpO2 97 %     Weight      Height      Head Circumference      Peak Flow      Pain  Score 0     Pain Loc      Pain Education      Exclude from Growth Chart    No data found.  Updated Vital Signs BP (!) 153/91 (BP Location: Right Arm)   Pulse 85   Temp 98.2 F (36.8 C) (Oral)   Resp 18   SpO2 97%   Visual Acuity Right Eye Distance:   Left Eye Distance:   Bilateral Distance:    Right Eye Near:   Left Eye Near:    Bilateral Near:     Physical Exam Constitutional:      General: He is not in acute distress.    Appearance: Normal appearance. He is not ill-appearing.  HENT:     Head: Normocephalic and atraumatic.     Nose: Nose normal.  Eyes:     Extraocular Movements: Extraocular movements intact.     Conjunctiva/sclera: Conjunctivae normal.     Pupils: Pupils are equal, round, and reactive to light.  Cardiovascular:     Rate and Rhythm: Normal rate and regular rhythm.  Pulmonary:     Effort: Pulmonary effort is normal.     Breath sounds: Normal breath sounds.  Musculoskeletal:        General: Normal range of motion.     Cervical back: Normal range of motion.  Skin:    General: Skin is warm and dry.     Comments: Left lower flank/mid back, JP drain intact suturing material to stabilize JP drain intact.  No pain, no drainage no redness at surgical site.  Neurological:     General: No focal deficit present.     Mental Status: He is alert and  oriented to person, place, and time.      UC Treatments / Results  Labs (all labs ordered are listed, but only abnormal results are displayed) Labs Reviewed - No data to display  EKG   Radiology CT GUIDED PERITONEAL/RETROPERITONEAL FLUID DRAIN BY [facility] CATH Result Date: 10/29/2023 Jenna Cordella LABOR, MD     10/29/2023 11:45 AM Pre procedural Dx: left retroperitoneal fluid collection Post procedural Dx: Same Technically successful CT guided placed of a 10 Fr drainage catheter placement into the collection yielding clear fluid.  A representative aspirated sample was capped and sent to the laboratory for analysis.  EBL: Trace Complications: None immediate KANDICE Jenna, MD Pager #: [phone]    Procedures Procedures (including critical care time)  Medications Ordered in UC Medications - No data to display  Initial Impression / Assessment and Plan / UC Course  I have reviewed the triage vital signs and the nursing notes.  Pertinent labs & imaging results that were available during my care of the patient were reviewed by me and considered in my medical decision making (see chart for details).    Nursing was able to successfully remove the dressing and this writer daily at the site and JP training and suture material are intact.  Applied a nonadherent dressing to the surgical site.  No additional interventions needed.  Advised to follow up with interventional radiologist as instructed. Final Clinical Impressions(s) / UC Diagnoses   Final diagnoses:  Encounter for change or removal of surgical wound dressing   Discharge Instructions   None    ED Prescriptions   None    PDMP not reviewed this encounter.   Arloa Suzen RAMAN, NP 10/30/23 [phone]

## 2023-11-01 NOTE — Telephone Encounter (Signed)
 Brandon Riley had his JP drain placed and is doing [facility] better clinically.    I see cultures were obtained, please call the [facility] (Not [facility] for this specimen) and see if they can add cytology testing to the specimen they have.  (Making sure we aren't dealing with any malignant cells)

## 2023-11-02 ENCOUNTER — Other Ambulatory Visit: Payer: Self-pay | Admitting: Licensed Clinical Social Worker

## 2023-11-02 ENCOUNTER — Other Ambulatory Visit (HOSPITAL_COMMUNITY)
Admission: RE | Admit: 2023-11-02 | Discharge: 2023-11-02 | Disposition: A | Discharge status: Home / Self Care | Source: Ambulatory Visit | Attending: Sports Medicine | Admitting: Sports Medicine

## 2023-11-02 ENCOUNTER — Other Ambulatory Visit: Payer: Self-pay

## 2023-11-02 DIAGNOSIS — R188 Other ascites: Secondary | ICD-10-CM | POA: Insufficient documentation

## 2023-11-02 NOTE — Patient Instructions (Signed)
 Visit Information  Thank you for taking time to visit with me today. Please don't hesitate to contact me if I can be of assistance to you before our next scheduled appointment.  Your next care management appointment is by telephone on 12/03/2023.   Please call the care guide team at [phone] if you need to cancel, schedule, or reschedule an appointment.   Please call the Suicide and Crisis Lifeline: 988 if you are experiencing a Mental Health or Behavioral Health Crisis or need someone to talk to.  Alm Armor, LCSW [facility], [facility] Licensed Clinical Social Worker Care Coordinator [phone]

## 2023-11-02 NOTE — Patient Outreach (Signed)
 Complex Care Management   Visit Note  11/02/2023  Name:  Brandon Riley MRN: [id number] DOB: July 29, 1960  Situation: Referral received for Complex Care Management related to Acute Kidney Injury I obtained verbal consent from Patient.  Visit completed with patient  on the phone  Background:   Past Medical History:  Diagnosis Date   Arthritis    BPH (benign prostatic hyperplasia)    Complication of anesthesia    epidural did not work ended up with general and now trouble w/ urinatiing needing laser enucleation of prastate   GERD (gastroesophageal reflux disease)    Hyperlipemia    Hypertension    OSA on CPAP    Restless leg syndrome     Assessment: Patient Reported Symptoms:  Cognitive Cognitive Status: Normal speech and language skills, Insightful and able to interpret abstract concepts, Alert and oriented to person, place, and time Cognitive/Intellectual Conditions Management [RPT]: None reported or documented in medical history or problem list   Health Maintenance Behaviors: Annual physical exam Healing Pattern: Unsure Health Facilitated by: Stress management, Pain control, Rest  Neurological Neurological Review of Symptoms: No symptoms reported    HEENT HEENT Symptoms Reported: No symptoms reported      Cardiovascular Cardiovascular Symptoms Reported: No symptoms reported Does patient have uncontrolled Hypertension?: No Is patient checking Blood Pressure at home?: Yes Cardiovascular Management Strategies: Medication therapy, Coping strategies, Routine screening  Respiratory Respiratory Symptoms Reported: No symptoms reported    Endocrine Endocrine Symptoms Reported: No symptoms reported    Gastrointestinal Gastrointestinal Symptoms Reported: Abdominal pain or discomfort Gastrointestinal Management Strategies: Medication therapy    Genitourinary Genitourinary Symptoms Reported: Not assessed    Integumentary Integumentary Symptoms Reported: No symptoms reported     Musculoskeletal Musculoskelatal Symptoms Reviewed: Limited mobility Additional Musculoskeletal Details: Uses cane or walker when out Musculoskeletal Management Strategies: Medical device Musculoskeletal Self-Management Outcome: 3 (uncertain)      Psychosocial Psychosocial Symptoms Reported: Depression - if selected complete PHQ 2-9 Behavioral Management Strategies: Coping strategies, Medication therapy Behavioral Health Self-Management Outcome: 3 (uncertain) Major Change/Loss/Stressor/Fears (CP): Medical condition, self Techniques to Cope with Loss/Stress/Change: Counseling, Medication Quality of Family Relationships: supportive, helpful, involved Do you feel physically threatened by others?: No      11/02/2023   11:10 AM  Depression screen PHQ 2/9  Decreased Interest 1  Down, Depressed, Hopeless 2  PHQ - 2 Score 3  Altered sleeping 2  Tired, decreased energy 2  Change in appetite 1  Feeling bad or failure about yourself  1  Trouble concentrating 1  Moving slowly or fidgety/restless 0  Suicidal thoughts 0  PHQ-9 Score 10  Difficult doing work/chores Somewhat difficult    There were no vitals filed for this visit.  Medications Reviewed Today     Reviewed by Kit Alm LABOR, LCSW (Social Worker) on 11/02/23 at 1101  Med List Status: <None>   Medication Order Taking? Sig Documenting Provider Last Dose Status Informant  acetaminophen  (TYLENOL ) 500 MG tablet [id number] No Take 1,000 mg by mouth every 6 (six) hours as needed for mild pain (pain score 1-3) or moderate pain (pain score 4-6). [provider] 10/30/2023 Active Self, Pharmacy Records  ALPRAZolam  (XANAX ) 0.5 MG tablet [id number] No Take 1 tablet (0.5 mg total) by mouth 2 (two) times daily as needed for anxiety. Curtis Debby PARAS, MD 10/30/2023 Active Self, Pharmacy Records           Med Note (CRUTHIS, CHLOE C   Wed Oct 14, 2023  7:10 AM)  Ascorbic Acid (VITAMIN C PO) [id number] No Take 2 tablets by mouth every  other day. [provider] 10/30/2023 Active Self, Pharmacy Records  CHERRY PO [id number] No Take 250 mg by mouth in the morning, at noon, and at bedtime. [provider] 10/30/2023 Active Self, Pharmacy Records  docusate sodium  (COLACE) 250 MG capsule [id number] No Take 250 mg by mouth daily. [provider] 10/30/2023 Active   DULoxetine  (CYMBALTA ) 30 MG capsule [id number] No Take 1 capsule (30 mg total) by mouth daily. Curtis Debby PARAS, MD 10/30/2023 Active   metoprolol  tartrate (LOPRESSOR ) 25 MG tablet [id number] No Take 1 tablet (25 mg total) by mouth 2 (two) times daily. Drusilla Sabas RAMAN, MD 10/30/2023 Active Self, Pharmacy Records  Nystatin  POWD [id number] No Apply liberally to affected area 2 times per day Curtis Debby PARAS, MD 10/30/2023 Active   pantoprazole  (PROTONIX ) 40 MG tablet [id number] No Take 1 tablet (40 mg total) by mouth daily. Curtis Debby PARAS, MD 10/30/2023 Active   phenazopyridine  (PYRIDIUM ) 200 MG tablet [id number] No Take 1 tablet (200 mg total) by mouth 3 (three) times daily as needed for pain.  Patient not taking: Reported on 10/29/2023   Roseann Adine PARAS., MD Not Taking Active Self, Pharmacy Records  rOPINIRole  (REQUIP ) 1 MG tablet [id number] No Take 3 tablets (3 mg total) by mouth at bedtime. Curtis Debby PARAS, MD 10/30/2023 Active   rosuvastatin  (CRESTOR ) 10 MG tablet [id number] No TAKE ONE TABLET BY MOUTH DAILY  Patient taking differently: Take 10 mg by mouth every Monday, Wednesday, and Friday.   Curtis Debby PARAS, MD 10/30/2023 Active Self, Pharmacy Records  senna (SENOKOT) 8.6 MG TABS tablet [id number] No Take 2 tablets (17.2 mg total) by mouth daily as needed for moderate constipation. Drusilla Sabas RAMAN, MD 10/30/2023 Active Self, Pharmacy Records            Recommendation:   Monitor for changes in mood and utilize coping skills as needed.  Follow Up Plan:   Telephone follow up appointment date/time:  12/03/2023  Alm Armor,  LCSW [facility], [facility] Licensed Clinical Social Worker Care Coordinator [phone]

## 2023-11-02 NOTE — Telephone Encounter (Signed)
 Spoke with micro lab to make sure they had the specimen. Then, spoke with cytology lab. They are looking for orders and will get the sample from micro when they receive the orders.

## 2023-11-03 ENCOUNTER — Encounter: Payer: Self-pay | Admitting: Urology

## 2023-11-03 ENCOUNTER — Ambulatory Visit (INDEPENDENT_AMBULATORY_CARE_PROVIDER_SITE_OTHER): Admitting: Urology

## 2023-11-03 VITALS — BP 153/83 | HR 77 | Ht 67.0 in | Wt 212.0 lb

## 2023-11-03 DIAGNOSIS — N1330 Unspecified hydronephrosis: Secondary | ICD-10-CM | POA: Diagnosis not present

## 2023-11-03 DIAGNOSIS — N401 Enlarged prostate with lower urinary tract symptoms: Secondary | ICD-10-CM

## 2023-11-03 DIAGNOSIS — N138 Other obstructive and reflux uropathy: Secondary | ICD-10-CM | POA: Diagnosis not present

## 2023-11-03 DIAGNOSIS — N2889 Other specified disorders of kidney and ureter: Secondary | ICD-10-CM | POA: Diagnosis not present

## 2023-11-03 DIAGNOSIS — R310 Gross hematuria: Secondary | ICD-10-CM | POA: Diagnosis not present

## 2023-11-03 LAB — URINALYSIS, ROUTINE W REFLEX MICROSCOPIC
Bilirubin, UA: NEGATIVE
Glucose, UA: NEGATIVE
Nitrite, UA: NEGATIVE
Specific Gravity, UA: 1.025 (ref 1.005–1.030)
Urobilinogen, Ur: 0.2 mg/dL (ref 0.2–1.0)
pH, UA: 5.5 (ref 5.0–7.5)

## 2023-11-03 LAB — MICROSCOPIC EXAMINATION
RBC, Urine: >30 /HPF — AB (ref 0–2)
WBC, UA: >30 /HPF — AB (ref 0–5)

## 2023-11-03 LAB — AEROBIC/ANAEROBIC CULTURE W GRAM STAIN (SURGICAL/DEEP WOUND)
Culture: NO GROWTH
Gram Stain: NONE SEEN

## 2023-11-03 LAB — CYTOLOGY - NON PAP

## 2023-11-03 NOTE — Progress Notes (Signed)
 Assessment: 1. Benign prostatic hyperplasia with urinary obstruction   2. Gross hematuria   3. Hydronephrosis of left kidney   4. Perinephric fluid collection     Plan: I personally reviewed the patient's chart including provider notes, ER notes, lab and imaging results. He is having fairly significant output from the JP drain. JP fluid sent for Cr BMP today Urine culture sent today Continue percutaneous drain Will arrange follow-up pending results of above labs.  Chief Complaint:  Chief Complaint  Patient presents with   Benign Prostatic Hypertrophy    History of Present Illness:  Brandon Riley is a 63 y.o. male who is seen for continued evaluation of BPH with obstruction, gross hematuria, history of urinary retention, left hydronephrosis, left perinephric fluid collection.  He reported no problems with urination prior to his surgery.  He previously took tamsulosin  for a short period of time due to increased urinary symptoms.  However, he had discontinued this prior to his recent episode. He underwent a knee arthroplasty on 07/20/2023 and developed postoperative urinary retention.  There was an attempt at spinal anesthesia. He had a Foley catheter placed and was discharged home with a catheter and on tamsulosin .  He presented to [facility] ER on 3/27 with problems with the Foley catheter drainage.  He was found to have some small clots in the catheter.  His Foley was removed at [facility] on 07/30/2023.  He was unable to void following removal and return to the emergency room later that evening.  A Foley catheter was replaced on 07/31/2023 with return of 2000 mL of urine.  He continued on tamsulosin  0.8 mg daily.  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  He has a history of an elevated PSA. PSA  results: 10/21 1.2 5/22 6.0  23% free 6/22 6.5  28% free 3/23 5.0  32% free 6/23 4.9  41% free 6/24 4.8  31% free 1/25 5.5  28.5% free  He underwent a prostate biopsy in October 2022 by Dr. Norleen Blush with [facility].  Pathology showed benign prostate tissue with atrophy and associated mixed inflammation. Prostate volume:  125.9 g  His Foley catheter was removed on 08/10/2023 after a successful voiding trial in the office.  Unfortunately, he had difficulty voiding later that day and return to the emergency room where a Foley catheter was placed.  He was changed to silodosin  at that time.   Cystoscopy from 08/20/2023 showed lateral lobe enlargement of the prostate. He was unable to void later that day and required placement of a Foley catheter in the emergency room.  He has undergone a HoLEP by Dr. Francisca on 09/25/2023.  A total of 105 g of prostate tissue was removed.  Path showed BPH. He was admitted to the hospital on 09/26/2023 for abdominal pain and AKI.  Evaluation determined that the Foley catheter had pulled into the prostatic fossa.  He was also found to have mild left hydronephrosis.  He was admitted to the hospital for management of his abdominal pain with associated nausea and constipation.  His renal function gradually improved.  His constipation was aggressively managed.  He was discharged on 10/01/2023. His Foley catheter was removed on 10/07/2023 after a successful voiding trial in the office. He developed some gross hematuria with clots. This apparently occurred after straining with a bowel movement.  He had difficulty voiding and was seen in the emergency room in [place].  A Foley catheter was placed and the bladder was irrigated with return of clots.  His Foley was removed on 10/07/2023 after a successful voiding trial in the office. He was admitted to [facility] on 10/14/2023 due to gross hematuria and urinary retention.  He again presented to [facility] in  [place] on 10/14/2023.  He was started on continuous bladder irrigation and given TXA.  The Foley catheter was removed on hospital day #2 and he was voiding without difficulty.  The gross hematuria had significantly cleared. CT abdomen and pelvis without contrast from 10/14/2023 showed moderate to severe left hydronephrosis increased from 09/27/2023, no obstructing stone, dilation of the left ureter to the UVJ.  There is also evidence of loculated ascites in the left upper quadrant.  Creatinine on 10/14/2023 was 1.36. He again presented to the emergency room in [place] on 10/20/2023 with generalized abdominal pain and passage of a blood clot. CT abdomen and pelvis without contrast from 10/20/2023 showed moderate left hydronephrosis and hydroureter without evidence of an obstructing stone, and a left perinephric fluid collection measuring 7.1 x 3.8 cm. Urine culture showed no growth.  Creatinine 1.67. He underwent placement of a percutaneous drain by interventional radiology on 10/29/2023.  Cultures have shown no growth to date.  Cytology pending.  He presents today for follow-up.  He reports that he is feeling much better after placement of the percutaneous drain.  His abdominal pain has resolved.  He is having fairly regular bowel movements.  No nausea or vomiting.  The drain is putting out clear fluid.  He is having outputs between [id number] mL / 24 hours.  He is voiding spontaneously.  He continues to have some intermittent gross hematuria with passage of clots.  No further episodes of retention.  Portions of the above documentation were copied from a prior visit for review purposes only.   Past Medical History:  Past Medical History:  Diagnosis Date   Arthritis    BPH (benign prostatic hyperplasia)    Complication of anesthesia    epidural did not work ended up with general and now trouble w/ urinatiing needing laser enucleation of prastate   GERD (gastroesophageal reflux disease)     Hyperlipemia    Hypertension    OSA on CPAP    Restless leg syndrome     Past Surgical History:  Past Surgical History:  Procedure Laterality Date   CHONDROPLASTY Left 10/28/2018   Procedure: CHONDROPLASTY;  Surgeon: Cristy Bonner DASEN, MD;  Location: [facility];  Service: Orthopedics;  Laterality: Left;   HOLEP-LASER ENUCLEATION OF THE PROSTATE WITH MORCELLATION N/A 09/25/2023   Procedure: ENUCLEATION, PROSTATE, USING LASER, WITH MORCELLATION;  Surgeon: Francisca Redell BROCKS, MD;  Location: [facility];  Service: Urology;  Laterality: N/A;   KNEE ARTHROSCOPY WITH MEDIAL MENISECTOMY Left 10/28/2018   Procedure: LEFT KNEE ARTHROSCOPY WITH MEDIAL MENISECTOMY;  Surgeon: Cristy Bonner DASEN, MD;  Location: [facility];  Service: Orthopedics;  Laterality: Left;   RECONSTRUCTION OF NOSE  2019   TOTAL KNEE ARTHROPLASTY Left 07/20/2023   Procedure: ARTHROPLASTY, KNEE, TOTAL;  Surgeon: Edna Toribio LABOR, MD;  Location: [facility];  Service: Orthopedics;  Laterality: Left;   WISDOM TOOTH EXTRACTION  2012    Allergies:  Allergies  Allergen Reactions   Celebrex [Celecoxib] Palpitations    Heavy breathing   Flexeril [Cyclobenzaprine] Hives   Iodinated Contrast Media Rash  Rash for 3 days on arm where IV was placed and then next day it spread to the body    Family History:  Family History  Problem Relation Age of Onset   Dementia Mother    Heart Problems Mother        Double bypass   Diabetes Mother    Other Father        Prostate Issues    Social History:  Social History   Tobacco Use   Smoking status: Never    Passive exposure: Never   Smokeless tobacco: Never  Vaping Use   Vaping status: Never Used  Substance Use Topics   Alcohol  use: Not Currently    Comment: 2-3x per week   Drug use: Never    ROS: Constitutional:  Negative for fever, chills, weight loss CV: Negative for chest pain, previous MI, hypertension Respiratory:  Negative for shortness of breath,  wheezing, sleep apnea, frequent cough GI:  Negative for nausea, vomiting, bloody stool, GERD  Physical exam: BP (!) 153/83   Pulse 77   Ht 5' 7 (1.702 m)   Wt 212 lb (96.2 kg)   BMI 33.20 kg/m  GENERAL APPEARANCE:  Well appearing, well developed, well nourished, NAD HEENT:  Atraumatic, normocephalic, oropharynx clear NECK:  Supple without lymphadenopathy or thyromegaly ABDOMEN:  Soft, non-tender, no masses EXTREMITIES:  Moves all extremities well, without clubbing, cyanosis, or edema NEUROLOGIC:  Alert and oriented x 3, normal gait, CN II-XII grossly intact MENTAL STATUS:  appropriate BACK:  Non-tender to palpation, No CVAT; percutaneous drain in left flank draining clear yellow fluid; site looks good SKIN:  Warm, dry, and intact  Results: U/A: >30 WBC, >30 RBC, moderate bacteria, nitrite negative

## 2023-11-04 ENCOUNTER — Ambulatory Visit: Payer: Self-pay | Admitting: Urology

## 2023-11-04 ENCOUNTER — Ambulatory Visit: Attending: Orthopedic Surgery

## 2023-11-04 ENCOUNTER — Ambulatory Visit: Payer: Self-pay | Admitting: Sports Medicine

## 2023-11-04 DIAGNOSIS — M25562 Pain in left knee: Secondary | ICD-10-CM | POA: Diagnosis not present

## 2023-11-04 DIAGNOSIS — M6281 Muscle weakness (generalized): Secondary | ICD-10-CM | POA: Insufficient documentation

## 2023-11-04 DIAGNOSIS — R2689 Other abnormalities of gait and mobility: Secondary | ICD-10-CM | POA: Diagnosis not present

## 2023-11-04 LAB — BASIC METABOLIC PANEL WITH GFR
BUN/Creatinine Ratio: 17 (ref 10–24)
BUN: 19 mg/dL (ref 8–27)
CO2: 21 mmol/L (ref 20–29)
Calcium: 9.7 mg/dL (ref 8.6–10.2)
Chloride: 102 mmol/L (ref 96–106)
Creatinine, Ser: 1.15 mg/dL (ref 0.76–1.27)
Glucose: 71 mg/dL (ref 70–99)
Potassium: 4.5 mmol/L (ref 3.5–5.2)
Sodium: 141 mmol/L (ref 134–144)
eGFR: 72 mL/min/1.73 (ref >59)

## 2023-11-04 LAB — CREATININE, BODY FLUID OTHER: Creatinine, Body Fluid: 36.1 mg/dL

## 2023-11-04 NOTE — Therapy (Signed)
 OUTPATIENT PHYSICAL THERAPY LOWER EXTREMITY TREATMENT   Patient Name: Brandon Riley MRN: [id number] DOB:Jul 28, 1960, 64 y.o., male Today's Date: 11/04/2023  END OF SESSION:  PT End of Session - 11/04/23 1101     Visit Number 18    Number of Visits 25    Date for PT Re-Evaluation 11/22/23    Authorization Type aetna    Authorization - Number of Visits 30   per insurance total for the year   PT Start Time 1101    PT Stop Time 1141    PT Time Calculation (min) 40 min    Activity Tolerance Patient tolerated treatment well    Behavior During Therapy WFL for tasks assessed/performed            Past Medical History:  Diagnosis Date   Arthritis    BPH (benign prostatic hyperplasia)    Complication of anesthesia    epidural did not work ended up with general and now trouble w/ urinatiing needing laser enucleation of prastate   GERD (gastroesophageal reflux disease)    Hyperlipemia    Hypertension    OSA on CPAP    Restless leg syndrome    Past Surgical History:  Procedure Laterality Date   CHONDROPLASTY Left 10/28/2018   Procedure: CHONDROPLASTY;  Surgeon: Cristy Bonner DASEN, MD;  Location: [facility];  Service: Orthopedics;  Laterality: Left;   HOLEP-LASER ENUCLEATION OF THE PROSTATE WITH MORCELLATION N/A 09/25/2023   Procedure: ENUCLEATION, PROSTATE, USING LASER, WITH MORCELLATION;  Surgeon: Francisca Redell BROCKS, MD;  Location: [facility];  Service: Urology;  Laterality: N/A;   KNEE ARTHROSCOPY WITH MEDIAL MENISECTOMY Left 10/28/2018   Procedure: LEFT KNEE ARTHROSCOPY WITH MEDIAL MENISECTOMY;  Surgeon: Cristy Bonner DASEN, MD;  Location: [facility];  Service: Orthopedics;  Laterality: Left;   RECONSTRUCTION OF NOSE  2019   TOTAL KNEE ARTHROPLASTY Left 07/20/2023   Procedure: ARTHROPLASTY, KNEE, TOTAL;  Surgeon: Edna Toribio LABOR, MD;  Location: [facility];  Service: Orthopedics;  Laterality: Left;   WISDOM TOOTH EXTRACTION  2012   Patient Active Problem List   Diagnosis  Date Noted   Perinephric fluid collection 11/03/2023   Abdominal fluid collection 10/23/2023   Intertrigo 10/19/2023   Hydronephrosis 10/14/2023   AKI (acute kidney injury) (HCC) 09/27/2023   Hypersomnia 09/27/2023   Ileus, postoperative (HCC) 09/27/2023   Hydronephrosis of left kidney 09/27/2023   Malignant neoplasm of spine (HCC) 08/31/2023   Sacral lesion 08/04/2023   Urinary retention 08/03/2023   History of total knee arthroplasty, left 07/20/2023   Preoperative clearance 05/12/2023   COVID-19 03/30/2023   Restless leg syndrome 10/08/2022   Dermatitis 09/01/2022   Prediabetes 07/09/2021   BPH (benign prostatic hyperplasia) 06/12/2021   Elevated PSA 09/07/2020   Polyarthralgia 08/31/2020   Steatohepatitis 05/16/2020   Open fracture of left thumb 01/31/2020   GERD (gastroesophageal reflux disease) 11/07/2016   Primary osteoarthritis of right wrist 09/29/2016   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   Left insertional Achilles tendinosis 11/05/2015   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   Obstructive sleep apnea on BiPAP with periodic limb movement disorder 06/05/2014   Obesity 05/02/2013   Annual physical exam 04/04/2013   Primary osteoarthritis of both knees with pseudogout 04/04/2013   Degenerative disc disease, cervical 04/04/2013   Hyperlipidemia 04/04/2013   Benign essential hypertension 04/04/2013   Lumbar degenerative disc disease 04/04/2013    PCP: Curtis Ned MD REFERRING PROVIDER: Edna Toribio, MD REFERRING DIAG:  Diagnosis  [phone] (ICD-10-CM) -  S/P total knee replacement   THERAPY DIAG:  Acute pain of left knee  Other abnormalities of gait and mobility  Muscle weakness (generalized)  Rationale for Evaluation and Treatment: Rehabilitation  ONSET DATE: 07/20/23 surgical date  SUBJECTIVE:  SUBJECTIVE STATEMENT: Patient had catheter placed on 10/29/23 due to abdominal fluid. Possible removal will take place on Monday. With  catheter in place he was instructed to avoid strenous activity and avoid activities that pull on the drain. Overall the knee is good,  but still feels stiff. No real pain in the knee.   EVAL:The patient reports both knees are shot having meniscus repair in the past. He expects pain and is ready to start rehab. He had 1.5 years of pain prior to surgery. His surgery was on 07/20/23 for L TKR and post surgical course was complicated by urinary retention-- he is using a foley cath at this time and has appt with urology next week. He is using ice for pain control and meds.   PERTINENT HISTORY: Covid, RLS, OSA on bipap, HTN, DDD  PAIN:  Are you having pain? No  PRECAUTIONS: with catheter in place no strenuous activity or activity that pulls on the drain   WEIGHT BEARING RESTRICTIONS: WBAT  FALLS:  Has patient fallen in last 6 months? Yes, 3--felt balance was off (works on this at [employer])  LIVING ENVIRONMENT: Lives with: lives with their spouse Lives in: House/apartment Stairs: Yes: Internal: 17, basement steps; one rail and 2 walls and External: 2 steps; none Has following equipment at home: Walker - 2 wheeled  PATIENT GOALS: Good quality of life-- be able to do 7/8 of what I used to do.  NEXT MD VISIT: 10/01/23  OBJECTIVE:  Note: Objective measures were completed at Evaluation unless otherwise noted. PATIENT SURVEYS:  LEFS 10% COGNITION: Overall cognitive status: Within functional limits for tasks assessed   EDEMA:  Circumferential: 37 on R and 44 on L PALPATION: Edema L knee with dressing intact LOWER EXTREMITY ROM: Active ROM Left eval Left 07/27/23 Left 08/03/23 08/13/23 Left  08/17/23 Left 08/20/23 Left  08/24/23 Left 08/26/23 09/18/23 Left 09/23/23 10/08/23 Left AROM 10/21/23 Left AROM  Hip flexion              Hip extension              Hip abduction              Hip adduction              Hip internal rotation              Hip external rotation              Knee flexion  AROM 75 deg heel slide AAROM 94 deg AAROM 102 deg supine  Sitting AAROM 103 deg 90 AROM 104 AROM heel slide 105 110  111 PROM 108 AROM 112 degrees AAROM knee to chest 110 122 degrees  Knee extension -9 PROM -12 AROM  -6 deg supine   Lacking 4 in supine  2 degrees (from full extension) prone hang      Ankle dorsiflexion              Ankle plantarflexion              Ankle inversion              Ankle eversion               (Blank rows =  not tested) LOWER EXTREMITY MMT: MMT Right eval Left eval Left 08/17/23 10/08/23 Left   Hip flexion    4- knee pain  Hip extension      Hip abduction      Hip adduction      Hip internal rotation      Hip external rotation      Knee flexion    4+  Knee extension  LAQ to -38 Extensor Lag -20 4  Ankle dorsiflexion      Ankle plantarflexion      Ankle inversion      Ankle eversion       (Blank rows = not tested) FUNCTIONAL TESTS:  Gait speed = 1.23 ft/sec GAIT: Distance walked: 100 ft Assistive device utilized: Environmental consultant - 2 wheeled Level of assistance: Modified independence Comments: stairs with RW since no rails at home with step to pattern and SBA for safety  [facility] Adult PT Treatment:                                                DATE: 11/04/23 Therapeutic Exercise: Modified butterfly stretch 2 x 30 sec  Figure 4 stretch 2 x 30 sec  LAQ 2 x 10; 4 lbs  Seated figure 4 stretch partial range 2 x 10 sec Standing quad stretch 2 x 30 sec Reviewed HEP discussing activities to avoid with catheter  Therapeutic Activity: Step up knee driver 2 x 10; 6 inch    [facility] Adult PT Treatment:                                                DATE: 10/21/23 Therapeutic Exercise: Supine Knee to chest physioball roll Rocking lumbar spine with ball  Bridges x 10 reps  Heel slide and knee to chest for end range measurements-- noted improvement in flexion Attempted L heel to R knee in hooklying-- unable Bent knee fallouts Hip adductor stretch R and L sides  within tolerable ROM Gait: 20/10.97=1.82 ft/sec for gait speed  200 feet ambulation at self chosen pace with good stride length Self Care: Discussed current HEP and recommended walking program as main focus to maintain mobility Also discussed that if any exercises increase abdominal pain, then discontinue at this time.   [facility] Adult PT Treatment:                                                DATE: 10/08/23 Therapeutic Exercise: ROM and MMT measurements Supine march 2 x 10  Seated HS curl 2 x 10 green band Standing calf raise 2 x 10  LAQ 2 x 10  Therapeutic Activity: Walking 1 lap in clinic  Sit to stand x 5, x 4 with UE support  Standing march 2 x 10  Self Care: HEP review recommended to d/c prone exercises and limit activity that will stress abdomen (SLR)  Initiate walking program at home   [facility] Adult PT Treatment:  DATE: 09/23/23 Therapeutic Exercise: Standing Knee flexion to end range with L foot on 12 step Step ups to 6 step with increased pain today Single leg heel raises Single leg balance dec'ing UE support L LE Loading through knee-- L leg on mat table with towel roll and performing gentle rocking into flexion (beginning of quadriped position)-- unable to tolerate weightbearing in this position Prone Knee flexion AROM with passive overpressure by PT--fatigues after 8 reps Manual Therapy: STM L hamstring in prone position, supine adductors--much improved from last session due to patient performing self massage Gait: Gait with cues for maintaining L weight shift during mid stance x 400 ft in [facility] of [facility] Gait speed=2.53 ft/sec   PATIENT EDUCATION:  Education details: d/c SLR as part of HEP Person educated: Patient,  Education method: Explanation,  Education comprehension: verbalized understanding,  HOME EXERCISE PROGRAM: Access Code: [email or web address] URL: [email or web address] Date:  09/18/2023 Prepared by: Tawni Ferrier  Exercises - Seated Long Arc Quad  - 1 x daily - 7 x weekly - 2 sets - 10 reps - Supine Straight Leg Raises  - 1 x daily - 7 x weekly - 2 sets - 10 reps - Heel Raises with Counter Support  - 1 x daily - 7 x weekly - 2 sets - 12 reps - Standing Terminal Knee Extension with Resistance  - 1 x daily - 7 x weekly - 2 sets - 10 reps - Prone Knee Extension with Ankle Weight  - 1 x daily - 7 x weekly - 1 sets - max hold - Backwards Walking  - 1 x daily - 7 x weekly - 3 sets - 10 reps - Standing Hip Abduction with Counter Support  - 1 x daily - 7 x weekly - 2 sets - 10 reps - Standing Hip Extension with Counter Support  - 1 x daily - 7 x weekly - 2 sets - 10 reps - Tandem Stance  - 1 x daily - 7 x weekly - 3 sets - 30 sec  hold - Single Leg Stance  - 1 x daily - 7 x weekly - 3 sets - 30 sec  hold - Hip Adductors and Hamstring Stretch with Strap  - 1 x daily - 5 x weekly - 1 sets - 3 reps - 30 seconds hold - Standing Hamstring Stretch with Step  - 1 x daily - 5 x weekly - 1 sets - 3 reps - 30 seconds hold  ASSESSMENT:  CLINICAL IMPRESSION: Patient returns to PT with recent drainage catheter placed on 10/29/23 due to retroperitoneal fluid. He is to avoid strenuous activity or anything that pulls on the drain at this time. He continues to complain of tightness/stiffness in the knee. We focused on stretching for the knee to improve ability to don/doff socks and progressed standing strengthening/balance activity with good tolerance. Initial difficulty maintaining SLS with step ups, but with continued reps balance improves. Difficulty with seated figure 4 stretch due to Lt knee tightness. Recommended to discontinue SLR as part of HEP due to catheter with patient verbalizing understanding.   EVAL: Patient is a 63 y.o. male who was seen today for physical therapy evaluation and treatment for L TKR on 07/20/23. He presents today with impairments in ROM, strength, edema,  pain, and functional limitations s/p surgery. Patient's hospital course complicated by urinary retention. He is f/u in OP with urology. PT to progress patient to tolerance with goal of returning to prior functional status with reduced pain.  OBJECTIVE IMPAIRMENTS: Abnormal gait, decreased activity tolerance, decreased balance, decreased endurance, decreased ROM, decreased strength, hypomobility, increased edema, increased fascial restrictions, impaired flexibility, and pain.   GOALS: Goals reviewed with patient? Yes UPDATED GOALS:  SHORT TERM GOALS: Target date: 10/23/23  The patient will be indep with HEP progression. Baseline: has HEP-- has been limited in performing this week due to other medical appts and procedures Goal status: UPDATED  2.   The patient will demonstrate reciprocal pattern with descending stairs. Baseline:  step to pattern to descend/ able to ascend with reciprocal pattern. Goal status:UPDATED  LONG TERM GOALS: Target date: 11/22/23  The patient will return to gym routine for post d/c activities. Baseline: limited exercises at gym Goal status: UPDATED  2.  The patient will improve LEFS up to 50% to demonstrate improved functional abilities. Baseline: 42.5% ON 09/23/23 Goal status: UPDATED   3.  The patient will improve AROM L knee to 115 degrees Baseline:  112 degrees Goal status : MET  4.  The patient will normalize gait mechanics. Baseline: favors L LE and has antalgic gait upon rising from sitting Goal status: UPDATED   PLAN:  PT FREQUENCY: 2x/week x 4 weeks, 1x/week x 4 weeks (will reduce to 1x/week as soon as able)  PT DURATION: 8 total weeks  PLANNED INTERVENTIONS: 97164- PT Re-evaluation, 97110-Therapeutic exercises, 97530- Therapeutic activity, 97112- Neuromuscular re-education, 97535- Self Care, [id number]- Manual therapy, [id number]- Gait training, [phone]- Electrical stimulation (unattended), 97016- Vasopneumatic device, 97033- Ionotophoresis 4mg /ml  Dexamethasone , Patient/Family education, Balance training, Stair training, Taping, Dry Needling, Joint mobilization, DME instructions, and Cryotherapy  PLAN FOR NEXT SESSION:  progress therex to tolerance, Gait training working on normalizing gait mechanics. Balance activity as tolerated.   Work on donning socks as tolerated. Anticipate d/c   Aaro Meyers, PT, DPT, ATC 11/04/23 11:43 AM

## 2023-11-05 ENCOUNTER — Encounter: Payer: Self-pay | Admitting: Urology

## 2023-11-06 ENCOUNTER — Other Ambulatory Visit

## 2023-11-06 ENCOUNTER — Telehealth: Payer: Self-pay | Admitting: Urology

## 2023-11-06 NOTE — Progress Notes (Signed)
 Urology telephone note  Brief history: 63 year old male who was referred by Dr. Roseann for [facility] for BPH with urinary retention.  He was also felt to have possible metastatic disease based on bone lesions on CT, however PSA was essentially normal, and pathology from [facility] 09/25/2023 showed only BPH with no evidence of malignancy.  PET scan is ordered by PCP.  He underwent HOLEP on 09/25/2023 with removal of 105 g of benign tissue.  Ureteral orifices were very close to the bladder neck.  Had very challenging postop course with multiple hospitalizations for abdominal pain and gross hematuria/clot retention, problems with his Foley catheter.  Had multiple CT scans showing left-sided hydronephrosis down to the bladder with no evidence of stones, felt to be related to edema at the bladder neck and/or Foley catheter with large volume of fluid in the balloon.  He was admitted at multiple outside hospitals.  In the setting of worsening renal function and left-sided flank pain, underwent IR drain placement in the left-sided renal fluid collection, that likely represents a forniceal rupture based on elevated creatinine of that fluid, and normalization of his renal function after drain placement, and resolution of his abdominal and flank pain.  I had a long phone conversation with patient and his wife today.  High suspicion for obstruction of the left distal ureter after HoLEP causing the hydronephrosis on the left side and likely a forniceal rupture with fluid collection around the left kidney.    I think the next best step would be to look in cystoscopically in the OR to see if we can open the left ureteral orifice with resection and potentially place a ureteral stent.  Would anticipate drain remaining in place for at least 1 to 2 weeks after that procedure to maximize drainage and allow for healing of the left kidney, may also require temporary catheter placement.  Risk and benefits discussed extensively  including bleeding, infection, inability to identify the left ureteral orifice, catheter related symptoms, left-sided flank pain.  They understandably have concerns about being discharged with a Foley catheter, and a Foley catheter left, could certainly monitor him overnight.  Tentatively will schedule for Thursday 7/17 if OR availability at 7:30 AM.  Redell Burnet, MD 11/06/2023

## 2023-11-08 LAB — URINE CULTURE

## 2023-11-08 NOTE — Progress Notes (Signed)
 Referring Physician(s): Curtis Debby PARAS   Chief Complaint: The patient is seen in follow up today s/p left retroperitoneal drain placement 10/29/23.  History of present illness:  Brandon Riley is a 63 y.o. male with a history of BPH with obstruction s/p HoLEP 09/25/23 with [facility] who has presented to the ED multiple times with complaints of abdominal pain and hematuria. CT scan 6/17 was significant for moderate hydronephrosis with obstructing distal ureteral calculi. Repeat scan on 6/25 showed no obstructing stone, but was notable for increased perinephric fluid collection measuring 7.1 x 3.8 cm. He was offered transfer to [facility] for continuation of care with Urology however, patient elected to be discharged and follow up with [facility] outpatient. Per chart, he followed up via telephone with his PCP where he was noted to have continued, severe left-sided abdominal pain. He was then referred to IR for possible drainage of intra-abdominal fluid collection.   He presented to the [facility] IR department 10/29/23 and a 10 Fr drain was placed into a left retroperitoneal fluid collection. A fluid sample was sent to the lab and it was significant for elevated creatinine levels. This finding likely represents a forniceal rupture.   The patient had a telephone consultation with Dr. Francisca (Urology) and he explained that there is high suspicion for obstruction of the left distal ureter after HoLEP. This has caused hydronephrosis on the left side and a likely forniceal rupture with fluid collection around the left kidney. Dr. Francisca recommended taking the patient to OR for a cystoscopy and attempt to open the left ureteral orifice with resection/possibly stent placement.   Dr. Francisca anticipates the IR drain remaining in place for 1-2 weeks following this procedure. The patient was agreeable to this and he is tentatively scheduled for 11/12/23.  The patient presents today to the  Interventional Radiology outpatient clinic for a drain evaluation.   Past Medical History:  Diagnosis Date   Arthritis    BPH (benign prostatic hyperplasia)    Complication of anesthesia    epidural did not work ended up with general and now trouble w/ urinatiing needing laser enucleation of prastate   GERD (gastroesophageal reflux disease)    Hyperlipemia    Hypertension    OSA on CPAP    Restless leg syndrome     Past Surgical History:  Procedure Laterality Date   CHONDROPLASTY Left 10/28/2018   Procedure: CHONDROPLASTY;  Surgeon: Cristy Bonner DASEN, MD;  Location: [facility];  Service: Orthopedics;  Laterality: Left;   HOLEP-LASER ENUCLEATION OF THE PROSTATE WITH MORCELLATION N/A 09/25/2023   Procedure: ENUCLEATION, PROSTATE, USING LASER, WITH MORCELLATION;  Surgeon: Francisca Redell BROCKS, MD;  Location: [facility];  Service: Urology;  Laterality: N/A;   KNEE ARTHROSCOPY WITH MEDIAL MENISECTOMY Left 10/28/2018   Procedure: LEFT KNEE ARTHROSCOPY WITH MEDIAL MENISECTOMY;  Surgeon: Cristy Bonner DASEN, MD;  Location: [facility];  Service: Orthopedics;  Laterality: Left;   RECONSTRUCTION OF NOSE  2019   TOTAL KNEE ARTHROPLASTY Left 07/20/2023   Procedure: ARTHROPLASTY, KNEE, TOTAL;  Surgeon: Edna Toribio LABOR, MD;  Location: [facility];  Service: Orthopedics;  Laterality: Left;   WISDOM TOOTH EXTRACTION  2012    Allergies: Celebrex [celecoxib], Flexeril [cyclobenzaprine], and Iodinated contrast media  Medications: Prior to Admission medications   Medication Sig Start Date End Date Taking? Authorizing Provider  acetaminophen  (TYLENOL ) 500 MG tablet Take 1,000 mg by mouth every 6 (six) hours as needed for mild pain (pain score 1-3) or  moderate pain (pain score 4-6).    [provider]  ALPRAZolam  (XANAX ) 0.5 MG tablet Take 1 tablet (0.5 mg total) by mouth 2 (two) times daily as needed for anxiety. 08/25/23   Curtis Debby PARAS, MD  Ascorbic Acid (VITAMIN C PO) Take  2 tablets by mouth every other day.    [provider]  docusate sodium  (COLACE) 250 MG capsule Take 250 mg by mouth daily.    [provider]  DULoxetine  (CYMBALTA ) 30 MG capsule Take 1 capsule (30 mg total) by mouth daily. 10/23/23   Curtis Debby PARAS, MD  metoprolol  tartrate (LOPRESSOR ) 25 MG tablet Take 1 tablet (25 mg total) by mouth 2 (two) times daily. 10/01/23 09/30/24  Drusilla Sabas RAMAN, MD  Nystatin  POWD Apply liberally to affected area 2 times per day 10/19/23   Curtis Debby PARAS, MD  rOPINIRole  (REQUIP ) 1 MG tablet Take 3 tablets (3 mg total) by mouth at bedtime. 10/27/23   Curtis Debby PARAS, MD  rosuvastatin  (CRESTOR ) 10 MG tablet TAKE ONE TABLET BY MOUTH DAILY Patient taking differently: Take 10 mg by mouth every Monday, Wednesday, and Friday. 09/21/22   Curtis Debby PARAS, MD     Family History  Problem Relation Age of Onset   Dementia Mother    Heart Problems Mother        Double bypass   Diabetes Mother    Other Father        Prostate Issues    Social History   Socioeconomic History   Marital status: Married    Spouse name: Not on file   Number of children: Not on file   Years of education: Not on file   Highest education level: Associate degree: occupational, Scientist, product/process development, or vocational program  Occupational History   Not on file  Tobacco Use   Smoking status: Never    Passive exposure: Never   Smokeless tobacco: Never  Vaping Use   Vaping status: Never Used  Substance and Sexual Activity   Alcohol  use: Not Currently    Comment: 2-3x per week   Drug use: Never   Sexual activity: Yes    Partners: Female    Comment: MARRIED  Other Topics Concern   Not on file  Social History Narrative   Are you right handed or left handed?  Right Handed   Are you currently employed ?Retired   What is your current occupation?   Do you live at home alone? No   Who lives with you? Wife and animals   What type of home do you live in: 1 story or 2  story? One story with a basement.       Social Drivers of Corporate investment banker Strain: Low Risk  (05/11/2023)   Overall Financial Resource Strain (CARDIA)    Difficulty of Paying Living Expenses: Not hard at all  Food Insecurity: No Food Insecurity (10/14/2023)   Hunger Vital Sign    Worried About Running Out of Food in the Last Year: Never true    Ran Out of Food in the Last Year: Never true  Transportation Needs: No Transportation Needs (10/14/2023)   PRAPARE - Administrator, Civil Service (Medical): No    Lack of Transportation (Non-Medical): No  Physical Activity: Sufficiently Active (05/11/2023)   Exercise Vital Sign    Days of Exercise per Week: 5 days    Minutes of Exercise per Session: 130 min  Stress: No Stress Concern Present (05/11/2023)   [place]  [employer] - Occupational Stress Questionnaire    Feeling of Stress : Only a little  Social Connections: Moderately Isolated (05/11/2023)   Social Connection and Isolation Panel    Frequency of Communication with Friends and Family: More than three times a week    Frequency of Social Gatherings with Friends and Family: More than three times a week    Attends Religious Services: Never    Database administrator or Organizations: No    Attends Engineer, structural: Not on file    Marital Status: Married     Vital Signs: There were no vitals taken for this visit.  Physical Exam  Imaging: No results found.  Labs:  CBC: Recent Labs    10/01/23 0525 10/08/23 0952 10/14/23 0406 10/29/23 1027  WBC 10.7* 11.0* 6.8 6.3  HGB 12.3* 12.0* 11.4* 11.4*  HCT 36.4* 38.4 35.9* 34.5*  PLT 200 415 338 287    COAGS: Recent Labs    10/29/23 1027  INR 1.2    BMP: Recent Labs    09/29/23 0521 09/30/23 0527 10/01/23 0525 10/08/23 0952 10/14/23 0406 11/03/23 1204  NA 132* 130* 129* 134 135 141  K 3.6 3.5 3.7 5.0 4.1 4.5  CL 102 97* 100 98 102 102  CO2 18* 20* 20* 19* 21*  21  GLUCOSE 134* 123* 116* 94 101* 71  BUN 19 19 19 19 19 19   CALCIUM  8.6* 8.5* 8.4* 9.0 8.7* 9.7  CREATININE 1.48* 1.51* 1.36* 1.56* 1.36* 1.15  GFRNONAA 53* 52* 59*  --  59*  --     LIVER FUNCTION TESTS: Recent Labs    09/27/23 1043 09/28/23 0443 10/01/23 0525 10/08/23 0952  BILITOT 2.0* 2.1* 1.2 0.4  AST 22 20 15 21   ALT 20 21 18 27   ALKPHOS 60 56 62 89  PROT 7.1 6.4* 6.5 7.0  ALBUMIN 3.6 3.0* 2.6* 3.6*    Assessment:  ***  Signed: Warren JONELLE Dais, NP 11/08/2023, 10:52 AM   Please refer to Dr. PIERRETTE attestation of this note for management and plan.

## 2023-11-09 ENCOUNTER — Ambulatory Visit
Admission: RE | Admit: 2023-11-09 | Discharge: 2023-11-09 | Disposition: A | Discharge status: Home / Self Care | Source: Ambulatory Visit | Attending: Radiology | Admitting: Radiology

## 2023-11-09 ENCOUNTER — Other Ambulatory Visit: Payer: Self-pay | Admitting: Urology

## 2023-11-09 ENCOUNTER — Other Ambulatory Visit: Payer: Self-pay

## 2023-11-09 ENCOUNTER — Ambulatory Visit
Admission: RE | Admit: 2023-11-09 | Discharge: 2023-11-09 | Disposition: A | Discharge status: Home / Self Care | Source: Ambulatory Visit | Attending: Urology | Admitting: Urology

## 2023-11-09 ENCOUNTER — Telehealth: Payer: Self-pay

## 2023-11-09 DIAGNOSIS — I700 Atherosclerosis of aorta: Secondary | ICD-10-CM | POA: Diagnosis not present

## 2023-11-09 DIAGNOSIS — N1330 Unspecified hydronephrosis: Secondary | ICD-10-CM | POA: Diagnosis not present

## 2023-11-09 DIAGNOSIS — N281 Cyst of kidney, acquired: Secondary | ICD-10-CM | POA: Diagnosis not present

## 2023-11-09 DIAGNOSIS — K6819 Other retroperitoneal abscess: Secondary | ICD-10-CM

## 2023-11-09 DIAGNOSIS — R188 Other ascites: Secondary | ICD-10-CM

## 2023-11-09 HISTORY — PX: IR RADIOLOGIST EVAL & MGMT: IMG5224

## 2023-11-09 MED ORDER — FLUCONAZOLE 100 MG PO TABS: 200.0000 mg | ORAL_TABLET | Freq: Every day | ORAL | 0 refills | Status: DC

## 2023-11-09 NOTE — Progress Notes (Signed)
   [facility] Posting Form  Surgery Date: Date: 11/12/2023  Surgeon: Dr. Redell Burnet, MD  Inpt ( No  )   Outpt (Yes)   Obs ( No  )   Diagnosis: N13.30 Left Hydronephrosis  -CPT: 47667  Surgery: Cystoscopy with Left Ureteral Stent Placement  Stop Anticoagulations: No  Cardiac/Medical/Pulmonary Clearance needed: no  *Orders entered into EPIC  Date: 11/09/23   *Case booked in EPIC  Date: 11/09/23  *Notified pt of Surgery: Date: 11/09/23  PRE-OP UA & CX: no  *Placed into Prior Authorization Work [place] Date: 11/09/23  Assistant/laser/rep:No

## 2023-11-09 NOTE — Progress Notes (Signed)
 Surgical Physician Order Form [facility]  Dr. Redell Burnet, MD  * Scheduling expectation : Thursday 7/18  *Length of Case: 45 minutes  *Clearance needed: no  *Anticoagulation Instructions: Hold all anticoagulants  *Aspirin  Instructions: 81mg  asa ok  *Post-op visit Date/Instructions:  tbd  *Diagnosis: Left Hydronephrosis  *Procedure: left Cysto w/stent placement (47667)   Additional orders: N/A  -Admit type: observation  -Anesthesia: General  -VTE Prophylaxis Standing Order SCD's       Other:   -Standing Lab Orders Per Anesthesia    Lab other: None  -Standing Test orders EKG/Chest x-ray per Anesthesia       Test other:   - Medications:  Ancef  2gm IV  -Other orders:  N/A

## 2023-11-09 NOTE — Telephone Encounter (Signed)
 Per Dr. Francisca, Patient is to be scheduled for Cystoscopy with Left Ureteral Stent Placement   [name] was contacted and possible surgical dates were discussed, Thursday July 17th, 2025 was agreed upon for surgery.   Patient was directed to call [phone] between 1-3pm the day before surgery to find out surgical arrival time.  Instructions were given not to eat or drink from midnight on the night before surgery and have a driver for the day of surgery. On the surgery day patient was instructed to enter through the Medical Mall entrance of [facility] report the Same Day Surgery desk.   Pre-Admit Testing will be in contact via phone to set up an interview with the anesthesia team to review your history and medications prior to surgery.   Reminder of this information was sent via MyChart to the patient.

## 2023-11-10 ENCOUNTER — Other Ambulatory Visit: Payer: Self-pay

## 2023-11-10 ENCOUNTER — Encounter
Admission: RE | Admit: 2023-11-10 | Discharge: 2023-11-10 | Disposition: A | Discharge status: Home / Self Care | Source: Ambulatory Visit | Attending: Urology | Admitting: Urology

## 2023-11-10 ENCOUNTER — Telehealth: Admitting: Urology

## 2023-11-10 HISTORY — DX: Unspecified hydronephrosis: N13.30

## 2023-11-10 NOTE — Patient Instructions (Addendum)
 Your procedure is scheduled on: 11/12/2023  Report to the Registration Desk on the 1st floor of the Medical Mall. To find out your arrival time, please call [phone] between 1PM - 3PM on: 11/11/2023  If your arrival time is 6:00 am, do not arrive before that time as the Medical Mall entrance doors do not open until 6:00 am.  REMEMBER: Instructions that are not followed completely may result in serious medical risk, up to and including death; or upon the discretion of your surgeon and anesthesiologist your surgery may need to be rescheduled.  Do not eat food after midnight the night before surgery.  No gum chewing or hard candies.  One week prior to surgery: Stop Anti-inflammatories (NSAIDS) such as Advil, Aleve , Ibuprofen, Motrin, Naproxen , Naprosyn  and Aspirin  based products such as Excedrin, Goody's Powder, BC Powder. Stop ANY OVER THE COUNTER supplements until after surgery.  You may however, continue to take Tylenol  if needed for pain up until the day of surgery.    Continue taking all of your other prescription medications up until the day of surgery.  ON THE DAY OF SURGERY ONLY TAKE THESE MEDICATIONS WITH SIPS OF WATER :  metoprolol  tartrate (LOPRESSOR ) 25 MG tablet    No Alcohol  for 24 hours before or after surgery.  No Smoking including e-cigarettes for 24 hours before surgery.  No chewable tobacco products for at least 6 hours before surgery.  No nicotine patches on the day of surgery.  Do not use any recreational drugs for at least a week (preferably 2 weeks) before your surgery.  Please be advised that the combination of cocaine and anesthesia may have negative outcomes, up to and including death. If you test positive for cocaine, your surgery will be cancelled.  On the morning of surgery brush your teeth with toothpaste and water , you may rinse your mouth with mouthwash if you wish. Do not swallow any toothpaste or mouthwash.  You may shower on day of surgery.    Do not wear jewelry, make-up, hairpins, clips or nail polish.   Do not wear lotions, powders, or perfumes.   Do not shave body hair from the neck down 48 hours before surgery.  Contact lenses, hearing aids and dentures may not be worn into surgery.  Do not bring valuables to the hospital. [facility] is not responsible for any missing/lost belongings or valuables.    Bring your C-PAP to the hospital in case you may have to spend the night.   Notify your doctor if there is any change in your medical condition (cold, fever, infection).  Wear comfortable clothing (specific to your surgery type) to the hospital.    In case of increased patient census, it may be necessary for you, the patient, to continue your postoperative care in the Same Day Surgery department.  If you are being discharged the day of surgery, you will not be allowed to drive home. You will need a responsible individual to drive you home and stay with you for 24 hours after surgery.    Please call the Pre-admissions Testing Dept. at [phone] if you have any questions about these instructions.  Surgery Visitation Policy:  Patients having surgery or a procedure may have two visitors.  Children under the age of 2 must have an adult with them who is not the patient.  Inpatient Visitation:    Visiting hours are 7 a.m. to 8 p.m. Up to four visitors are allowed at one time in a patient room. The  visitors may rotate out with other people during the day.  One visitor age 75 or older may stay with the patient overnight and must be in the room by 8 p.m.   Merchandiser, retail to address health-related social needs:  [email or web address]

## 2023-11-11 ENCOUNTER — Encounter: Payer: Self-pay | Admitting: *Deleted

## 2023-11-11 ENCOUNTER — Encounter: Payer: Self-pay | Admitting: Urology

## 2023-11-11 ENCOUNTER — Encounter: Admitting: Rehabilitative and Restorative Service Providers"

## 2023-11-11 NOTE — Progress Notes (Signed)
 This navigator has reached out to patient several times since first receiving this referral. Patient has repeatedly delayed scheduling due to other health concerns. He has had multiple complications and procedures in the last few months and feels strongly that he needs to address and complete treatment for these issues before he moves onto work up with us .   Spoke to him again today, and he wishes for referral to be closed. When he is ready for PET and Biopsy he will have his PCP resubmit the referral and will schedule. Message sent to referral team and referring MD.   Oncology Nurse Navigator Documentation     11/11/2023    1:30 PM  Oncology Nurse Navigator Flowsheets  Navigation Complete Date: 11/11/2023  Post Navigation: Continue to Follow Patient? No  Reason Not Navigating Patient: Other:  Tax adviser Type Telephone  Telephone Outgoing Call  Patient Visit Type MedOnc  Treatment Phase Abnormal Scans  Barriers/Navigation Needs Education  Education Other  Interventions Education;Psycho-Social Support  Acuity Level 1-No Barriers  Education Method Verbal  Time Spent with Patient 15

## 2023-11-12 ENCOUNTER — Other Ambulatory Visit: Payer: Self-pay

## 2023-11-12 ENCOUNTER — Encounter
Admission: RE | Disposition: A | Discharge status: Home / Self Care | Payer: Self-pay | Source: Home / Self Care | Attending: Urology

## 2023-11-12 ENCOUNTER — Ambulatory Visit

## 2023-11-12 ENCOUNTER — Ambulatory Visit: Admitting: Certified Registered Nurse Anesthetist

## 2023-11-12 ENCOUNTER — Encounter: Payer: Self-pay | Admitting: Urology

## 2023-11-12 ENCOUNTER — Observation Stay
Admission: RE | Admit: 2023-11-12 | Discharge: 2023-11-13 | Disposition: A | Discharge status: Home / Self Care | Attending: Urology | Admitting: Urology

## 2023-11-12 DIAGNOSIS — I10 Essential (primary) hypertension: Secondary | ICD-10-CM | POA: Insufficient documentation

## 2023-11-12 DIAGNOSIS — N3289 Other specified disorders of bladder: Secondary | ICD-10-CM | POA: Diagnosis not present

## 2023-11-12 DIAGNOSIS — N1330 Unspecified hydronephrosis: Secondary | ICD-10-CM | POA: Diagnosis not present

## 2023-11-12 DIAGNOSIS — Z96652 Presence of left artificial knee joint: Secondary | ICD-10-CM | POA: Insufficient documentation

## 2023-11-12 HISTORY — PX: CYSTOSCOPY WITH STENT PLACEMENT: SHX5790

## 2023-11-12 SURGERY — CYSTOSCOPY, WITH STENT INSERTION
Anesthesia: General | Site: Ureter | Laterality: Left

## 2023-11-12 MED ORDER — TRIPLE ANTIBIOTIC 3.5-400-5000 EX OINT: 1.0000 | TOPICAL_OINTMENT | Freq: Three times a day (TID) | CUTANEOUS | Status: DC | PRN

## 2023-11-12 MED ORDER — SUGAMMADEX SODIUM 200 MG/2ML IV SOLN
INTRAVENOUS | Status: DC | PRN
  Administered 2023-11-12: 192.4 mg via INTRAVENOUS

## 2023-11-12 MED ORDER — ONDANSETRON HCL 4 MG/2ML IJ SOLN: 4.0000 mg | INTRAMUSCULAR | Status: DC | PRN

## 2023-11-12 MED ORDER — CHLORHEXIDINE GLUCONATE 0.12 % MT SOLN
OROMUCOSAL | Status: AC
  Filled 2023-11-12: qty 15

## 2023-11-12 MED ORDER — ACETAMINOPHEN 500 MG PO TABS
1000.0000 mg | ORAL_TABLET | Freq: Four times a day (QID) | ORAL | Status: DC | PRN
  Administered 2023-11-12: 1000 mg via ORAL
  Filled 2023-11-12: qty 2

## 2023-11-12 MED ORDER — METOPROLOL TARTRATE 25 MG PO TABS
25.0000 mg | ORAL_TABLET | Freq: Two times a day (BID) | ORAL | Status: DC
Start: 2023-11-12 — End: 2023-11-13
  Administered 2023-11-12 – 2023-11-13 (×2): 25 mg via ORAL
  Filled 2023-11-12 (×2): qty 1

## 2023-11-12 MED ORDER — ROCURONIUM BROMIDE 100 MG/10ML IV SOLN
INTRAVENOUS | Status: DC | PRN
  Administered 2023-11-12: 50 mg via INTRAVENOUS

## 2023-11-12 MED ORDER — SODIUM CHLORIDE 0.9 % IR SOLN
Status: DC | PRN
  Administered 2023-11-12 (×3): 3000 mL

## 2023-11-12 MED ORDER — BELLADONNA ALKALOIDS-OPIUM 16.2-30 MG RE SUPP
RECTAL | Status: DC | PRN
  Administered 2023-11-12: 1 via RECTAL

## 2023-11-12 MED ORDER — ROSUVASTATIN CALCIUM 10 MG PO TABS
10.0000 mg | ORAL_TABLET | ORAL | Status: DC
  Filled 2023-11-12: qty 1

## 2023-11-12 MED ORDER — MIDAZOLAM HCL 2 MG/2ML IJ SOLN
INTRAMUSCULAR | Status: DC | PRN
  Administered 2023-11-12: 2 mg via INTRAVENOUS

## 2023-11-12 MED ORDER — CHLORHEXIDINE GLUCONATE 0.12 % MT SOLN
15.0000 mL | Freq: Once | OROMUCOSAL | Status: AC
  Administered 2023-11-12: 15 mL via OROMUCOSAL

## 2023-11-12 MED ORDER — CEFAZOLIN SODIUM-DEXTROSE 2-4 GM/100ML-% IV SOLN
INTRAVENOUS | Status: AC
  Filled 2023-11-12: qty 100

## 2023-11-12 MED ORDER — FENTANYL CITRATE (PF) 100 MCG/2ML IJ SOLN
INTRAMUSCULAR | Status: DC | PRN
  Administered 2023-11-12 (×2): 50 ug via INTRAVENOUS

## 2023-11-12 MED ORDER — HYDROMORPHONE HCL 1 MG/ML IJ SOLN: 0.5000 mg | INTRAMUSCULAR | Status: DC | PRN

## 2023-11-12 MED ORDER — BELLADONNA ALKALOIDS-OPIUM 16.2-30 MG RE SUPP: RECTAL | Status: DC | PRN

## 2023-11-12 MED ORDER — PROPOFOL 10 MG/ML IV BOLUS
INTRAVENOUS | Status: DC | PRN
  Administered 2023-11-12: 180 mg via INTRAVENOUS

## 2023-11-12 MED ORDER — ORAL CARE MOUTH RINSE
15.0000 mL | Freq: Once | OROMUCOSAL | Status: AC
Start: 2023-11-12 — End: 2023-11-12

## 2023-11-12 MED ORDER — BELLADONNA ALKALOIDS-OPIUM 16.2-60 MG RE SUPP
RECTAL | Status: AC
Start: 2023-11-12 — End: 2023-11-12
  Filled 2023-11-12: qty 1

## 2023-11-12 MED ORDER — CEFAZOLIN SODIUM-DEXTROSE 2-4 GM/100ML-% IV SOLN
2.0000 g | INTRAVENOUS | Status: AC
  Administered 2023-11-12: 2 g via INTRAVENOUS

## 2023-11-12 MED ORDER — NYSTATIN 100000 UNIT/GM EX POWD: 1.0000 | Freq: Two times a day (BID) | CUTANEOUS | Status: DC | PRN

## 2023-11-12 MED ORDER — ROPINIROLE HCL 1 MG PO TABS
3.0000 mg | ORAL_TABLET | Freq: Every day | ORAL | Status: DC
  Administered 2023-11-12: 3 mg via ORAL
  Filled 2023-11-12: qty 3

## 2023-11-12 MED ORDER — OXYCODONE HCL 5 MG/5ML PO SOLN: 5.0000 mg | Freq: Once | ORAL | Status: DC | PRN

## 2023-11-12 MED ORDER — ONDANSETRON HCL 4 MG/2ML IJ SOLN
INTRAMUSCULAR | Status: DC | PRN
Start: 2023-11-12 — End: 2023-11-12
  Administered 2023-11-12: 4 mg via INTRAVENOUS

## 2023-11-12 MED ORDER — LIDOCAINE HCL (CARDIAC) PF 100 MG/5ML IV SOSY
PREFILLED_SYRINGE | INTRAVENOUS | Status: DC | PRN
  Administered 2023-11-12: 100 mg via INTRAVENOUS

## 2023-11-12 MED ORDER — STERILE WATER FOR IRRIGATION IR SOLN
Status: DC | PRN
  Administered 2023-11-12: 1000 mL

## 2023-11-12 MED ORDER — FENTANYL CITRATE (PF) 100 MCG/2ML IJ SOLN: 25.0000 ug | INTRAMUSCULAR | Status: DC | PRN

## 2023-11-12 MED ORDER — POLYETHYLENE GLYCOL 3350 17 G PO PACK: 17.0000 g | PACK | Freq: Every day | ORAL | Status: DC | PRN

## 2023-11-12 MED ORDER — HEPARIN SODIUM (PORCINE) 5000 UNIT/ML IJ SOLN
5000.0000 [IU] | Freq: Three times a day (TID) | INTRAMUSCULAR | Status: DC
  Administered 2023-11-13: 5000 [IU] via SUBCUTANEOUS
  Filled 2023-11-12: qty 1

## 2023-11-12 MED ORDER — MIDAZOLAM HCL 2 MG/2ML IJ SOLN
INTRAMUSCULAR | Status: AC
  Filled 2023-11-12: qty 2

## 2023-11-12 MED ORDER — LACTATED RINGERS IV SOLN: INTRAVENOUS | Status: DC

## 2023-11-12 MED ORDER — DOCUSATE SODIUM 100 MG PO CAPS
100.0000 mg | ORAL_CAPSULE | Freq: Two times a day (BID) | ORAL | Status: DC
  Administered 2023-11-12 – 2023-11-13 (×2): 100 mg via ORAL
  Filled 2023-11-12 (×2): qty 1

## 2023-11-12 MED ORDER — DEXAMETHASONE SODIUM PHOSPHATE 10 MG/ML IJ SOLN
INTRAMUSCULAR | Status: DC | PRN
  Administered 2023-11-12: 10 mg via INTRAVENOUS

## 2023-11-12 MED ORDER — OXYBUTYNIN CHLORIDE ER 10 MG PO TB24
10.0000 mg | ORAL_TABLET | Freq: Every day | ORAL | Status: DC
  Filled 2023-11-12 (×2): qty 1

## 2023-11-12 MED ORDER — OXYCODONE HCL 5 MG PO TABS: 5.0000 mg | ORAL_TABLET | Freq: Once | ORAL | Status: DC | PRN

## 2023-11-12 MED ORDER — HYDROCODONE-ACETAMINOPHEN 5-325 MG PO TABS: 1.0000 | ORAL_TABLET | ORAL | Status: DC | PRN

## 2023-11-12 MED ORDER — ACETAMINOPHEN 10 MG/ML IV SOLN
INTRAVENOUS | Status: DC | PRN
  Administered 2023-11-12: 1000 mg via INTRAVENOUS

## 2023-11-12 MED ORDER — ALPRAZOLAM 0.5 MG PO TABS
0.5000 mg | ORAL_TABLET | Freq: Two times a day (BID) | ORAL | Status: DC | PRN
  Administered 2023-11-12: 0.5 mg via ORAL
  Filled 2023-11-12: qty 1

## 2023-11-12 MED ORDER — FLUCONAZOLE 100 MG PO TABS
200.0000 mg | ORAL_TABLET | Freq: Every day | ORAL | Status: DC
  Administered 2023-11-13: 200 mg via ORAL
  Filled 2023-11-12: qty 2

## 2023-11-12 MED ORDER — ACETAMINOPHEN 10 MG/ML IV SOLN
INTRAVENOUS | Status: AC
Start: 2023-11-12 — End: 2023-11-12
  Filled 2023-11-12: qty 100

## 2023-11-12 MED ORDER — IOHEXOL 180 MG/ML  SOLN
INTRAMUSCULAR | Status: DC | PRN
  Administered 2023-11-12 (×2): 10 mL

## 2023-11-12 MED ORDER — FENTANYL CITRATE (PF) 100 MCG/2ML IJ SOLN
INTRAMUSCULAR | Status: AC
  Filled 2023-11-12: qty 2

## 2023-11-12 SURGICAL SUPPLY — 20 items
ADAPTER IRRIG TUBE 2 SPIKE SOL (ADAPTER) IMPLANT
BAG DRAIN SIEMENS DORNER NS (MISCELLANEOUS) ×1 IMPLANT
BAG URINE DRAIN 2000ML AR STRL (UROLOGICAL SUPPLIES) IMPLANT
BRUSH SCRUB EZ 4% CHG (MISCELLANEOUS) IMPLANT
CATH FOL 2WAY LX 22X30 (CATHETERS) IMPLANT
CATH URETL OPEN 5X70 (CATHETERS) ×1 IMPLANT
GLOVE BIOGEL PI IND STRL 7.5 (GLOVE) ×1 IMPLANT
GOWN STRL REUS W/ TWL LRG LVL3 (GOWN DISPOSABLE) ×1 IMPLANT
GOWN STRL REUS W/ TWL XL LVL3 (GOWN DISPOSABLE) ×1 IMPLANT
GUIDEWIRE STR DUAL SENSOR (WIRE) ×1 IMPLANT
KIT TURNOVER CYSTO (KITS) ×1 IMPLANT
LOOP CUT BIPOLAR 24F LRG (ELECTROSURGICAL) IMPLANT
PACK CYSTO AR (MISCELLANEOUS) ×1 IMPLANT
SET CYSTO W/LG BORE CLAMP LF (SET/KITS/TRAYS/PACK) ×1 IMPLANT
SOL .9 NS 3000ML IRR UROMATIC (IV SOLUTION) ×1 IMPLANT
STENT URET 6FRX24 CONTOUR (STENTS) IMPLANT
STENT URET 6FRX26 CONTOUR (STENTS) IMPLANT
SURGILUBE 2OZ TUBE FLIPTOP (MISCELLANEOUS) ×1 IMPLANT
SYRINGE TOOMEY IRRIG 70ML (MISCELLANEOUS) IMPLANT
WATER STERILE IRR 1000ML POUR (IV SOLUTION) ×1 IMPLANT

## 2023-11-12 NOTE — Transfer of Care (Signed)
 Immediate Anesthesia Transfer of Care Note  Patient: Brandon Riley  Procedure(s) Performed: CYSTOSCOPY, WITH STENT INSERTION (Left: Ureter)  Patient Location: PACU  Anesthesia Type:General  Level of Consciousness: drowsy  Airway & Oxygen Therapy: Patient Spontanous Breathing and Patient connected to face mask oxygen  Post-op Assessment: Report given to RN and Post -op Vital signs reviewed and stable  Post vital signs: Reviewed and stable  Last Vitals:  Vitals Value Taken Time  BP 127/83 11/12/23 13:45  Temp    Pulse 68 11/12/23 13:51  Resp 13 11/12/23 13:51  SpO2 100 % 11/12/23 13:51  Vitals shown include unfiled device data.  Last Pain:  Vitals:   11/12/23 1109  TempSrc: Temporal  PainSc: 0-No pain         Complications: No notable events documented.

## 2023-11-12 NOTE — H&P (Signed)
 11/12/23 12:32 PM   Brandon Riley 22-Oct-1960 [id number]  CC: Left hydronephrosis  HPI: 63 year old male with complex urologic history.  Originally underwent HOLEP on 09/25/2023 for Foley dependent urinary retention.  Has had multiple hospitalizations at outside facilities for clot retention, gross hematuria, as well as new left hydronephrosis.  He ultimately had a drain placed by IR and had perinephric fluid collection that was consistent with urine most likely secondary to a forniceal rupture from upstream hydronephrosis.  Doing well from a urinary perspective, voiding with a strong stream, no incontinence, no gross hematuria.  Was treated with fluconazole  for Candida in the urine on 7/8.  Left flank pain resolved after drain placement.  PMH: Past Medical History:  Diagnosis Date   Arthritis    BPH (benign prostatic hyperplasia)    Complication of anesthesia    epidural did not work ended up with general and now trouble w/ urinatiing needing laser enucleation of prastate   GERD (gastroesophageal reflux disease)    Hydronephrosis of left kidney    Hyperlipemia    Hypertension    OSA on CPAP    Restless leg syndrome     Surgical History: Past Surgical History:  Procedure Laterality Date   CHONDROPLASTY Left 10/28/2018   Procedure: CHONDROPLASTY;  Surgeon: Cristy Bonner DASEN, MD;  Location: [facility];  Service: Orthopedics;  Laterality: Left;   HOLEP-LASER ENUCLEATION OF THE PROSTATE WITH MORCELLATION N/A 09/25/2023   Procedure: ENUCLEATION, PROSTATE, USING LASER, WITH MORCELLATION;  Surgeon: Francisca Redell BROCKS, MD;  Location: [facility];  Service: Urology;  Laterality: N/A;   IR RADIOLOGIST EVAL & MGMT  11/09/2023   KNEE ARTHROSCOPY WITH MEDIAL MENISECTOMY Left 10/28/2018   Procedure: LEFT KNEE ARTHROSCOPY WITH MEDIAL MENISECTOMY;  Surgeon: Cristy Bonner DASEN, MD;  Location: [facility];  Service: Orthopedics;  Laterality: Left;   RECONSTRUCTION OF NOSE  2019   TOTAL  KNEE ARTHROPLASTY Left 07/20/2023   Procedure: ARTHROPLASTY, KNEE, TOTAL;  Surgeon: Edna Toribio LABOR, MD;  Location: [facility];  Service: Orthopedics;  Laterality: Left;   WISDOM TOOTH EXTRACTION  2012      Family History: Family History  Problem Relation Age of Onset   Dementia Mother    Heart Problems Mother        Double bypass   Diabetes Mother    Other Father        Prostate Issues    Social History:  reports that he has never smoked. He has never been exposed to tobacco smoke. He has never used smokeless tobacco. He reports that he does not currently use alcohol . He reports that he does not use drugs.  Physical Exam: BP (!) 128/94   Pulse 74   Temp (!) 97.3 F (36.3 C) (Temporal)   Resp 18   Ht 5' 7 (1.702 m)   Wt 96.2 kg   SpO2 95%   BMI 33.20 kg/m    Constitutional:  Alert and oriented, No acute distress. Cardiovascular: Regular rate and rhythm Respiratory: Clear to auscultation bilaterally GI: Abdomen is soft, nontender, nondistended, no abdominal masses   Laboratory Data: See HPI  Pertinent Imaging: I have personally viewed and interpreted the multiple CT scans, most recent showing mild left hydronephrosis down to the bladder, left perinephric fluid collection with drain in appropriate location  Assessment & Plan:   63 year old male with likely left distal ureteral obstruction after HOLEP causing left-sided hydronephrosis.    Plan today for cystoscopy, possible transurethral resection of left  ureteral orifice and stent placement.  Risk and benefits discussed including bleeding, infection, stent related symptoms, inability to place stent requiring additional procedure, and likely need for drain and stent, possible Foley placement at least temporarily to allow for healing.  Cystoscopy, possible resection of left ureteral orifice, left ureteral stent placement, left retrograde pyelogram  Redell Burnet, MD 11/12/2023  [facility] [address], [address] [place], [place] [address] [phone]

## 2023-11-12 NOTE — Anesthesia Procedure Notes (Signed)
 Procedure Name: Intubation Date/Time: 11/12/2023 12:52 PM  Performed by: Duwayne Craven, CRNAPre-anesthesia Checklist: Patient identified, Patient being monitored, Timeout performed, Emergency Drugs available and Suction available Patient Re-evaluated:Patient Re-evaluated prior to induction Oxygen Delivery Method: Circle system utilized Preoxygenation: Pre-oxygenation with 100% oxygen Induction Type: IV induction Ventilation: Mask ventilation without difficulty, Oral airway inserted - appropriate to patient size and Two handed mask ventilation required Laryngoscope Size: McGrath and 4 Grade View: Grade I Tube type: Oral Tube size: 7.0 mm Number of attempts: 1 Airway Equipment and Method: Stylet and Video-laryngoscopy Placement Confirmation: ETT inserted through vocal cords under direct vision, positive ETCO2 and breath sounds checked- equal and bilateral Secured at: 23 cm Tube secured with: Tape Dental Injury: Teeth and Oropharynx as per pre-operative assessment

## 2023-11-12 NOTE — Op Note (Signed)
 Date of procedure: 11/12/23  Preoperative diagnosis:  Left hydronephrosis  Postoperative diagnosis:  Same  Procedure: Cystoscopy Resection of left ureteral orifice Left retrograde pyelogram with intraoperative interpretation Left ureteral stent placement  Surgeon: Redell Burnet, MD  Anesthesia: General  Complications: None  Intraoperative findings:  Open prostatic fossa from prior HOLEP Right ureteral orifice orthotopic, close bladder neck Bladder grossly normal Able to resect tissue over anticipated location of left ureteral orifice and orifice easily identified, retrograde pyelogram showed moderate hydronephrosis down to the bladder, left ureteral stent placed  EBL: Minimal  Specimens: None  Drains: Left 6 French by 26 cm ureteral stent, 22 French Foley  Indication: Brandon Riley is a 63 y.o. patient with complex urologic history.  He underwent a HOLEP for Foley dependent urinary retention on 09/25/2023.  He had multiple admissions at outside hospitals for gross hematuria and clot retention, bladder spasms, abdominal pain.  CT showed left hydronephrosis down to the bladder.  Ultimately, found to have a fluid collection adjacent to the left kidney and a drain was placed by interventional radiology, confirmed to be urine likely forniceal rupture. Here today for attempted resection of the left ureteral orifice and stent placement.  After reviewing the management options for treatment, they elected to proceed with the above surgical procedure(s). We have discussed the potential benefits and risks of the procedure, side effects of the proposed treatment, the likelihood of the patient achieving the goals of the procedure, and any potential problems that might occur during the procedure or recuperation. Informed consent has been obtained.  Description of procedure:  The patient was taken to the operating room and general anesthesia was induced. SCDs were placed for DVT prophylaxis. The  patient was placed in the dorsal lithotomy position, prepped and draped in the usual sterile fashion, and preoperative antibiotics(Ancef ) were administered. A preoperative time-out was performed.   A 26 French resectoscope with visual obturator was used to intubate the urethra and normal-appearing urethra was followed proximally to the bladder.  Prostatic fossa was open from prior HOLEP.  There was some bullous inflammatory tissue around the bladder neck and trigone.  The bladder itself was grossly normal.  The right ureteral orifice was identified adjacent to the bladder neck.  Over the anticipated location of the left ureteral orifice there was a fair amount of bullous inflammatory appearing tissue, but no ureteral orifice was identified.  I used a large bipolar resecting loop and took 3 swipes of tissue over the anticipated location of the left ureter, and fortunately was able to immediately identify what appeared to be the left ureteral orifice just at the edge of the bladder neck.  There was some drainage of some debris filled fluid.  A sensor wire was used to intubate the left ureteral orifice and advanced to the mid ureter, a 5 French access catheter was advanced over the wire, and a retrograde pyelogram showed moderate hydroureteronephrosis down to the bladder.  The wire was left in place, and the resectoscope removed.  The rigid cystoscope was backloaded over the wire and a 6 [place] by 26 cm ureteral stent was placed uneventfully with a curl in the kidney under fluoroscopic vision, and a curl visually in the bladder.  Fluid drained through the sideports of the stent.  The resectoscope was replaced and careful hemostasis was achieved within the prostatic fossa where tissue had been resected.  There was excellent hemostasis.  With the bladder decompressed no bleeding was noted.  A 22 French Foley passed easily  into the bladder with return of clear fluid, 15ml were placed in the balloon and the  catheter connected to drainage.  Disposition: Stable to PACU  Plan: Continue flank drain and Foley to drainage, monitor outputs Will admit overnight with his complex history and multiple admissions over the last month  Redell Burnet, MD

## 2023-11-12 NOTE — Anesthesia Preprocedure Evaluation (Signed)
 Anesthesia Evaluation  Patient identified by MRN, date of birth, ID band Patient awake  General Assessment Comment:Patient had very traumatic experience during joint replacement a few months ago. Appears he was under-sedated, he remembers being painfully stuck in the back, the person holding his position was painfully pushing him down, etc. He is very fixated on making sure he is adequately sedated prior to being wheeled into the operating room.  Reviewed: Allergy & Precautions, NPO status , Patient's Chart, lab work & pertinent test results  History of Anesthesia Complications Negative for: history of anesthetic complications  Airway Mallampati: II  TM Distance: >3 FB Neck ROM: Full    Dental no notable dental hx. (+) Teeth Intact   Pulmonary sleep apnea and Continuous Positive Airway Pressure Ventilation , neg COPD, Patient abstained from smoking.Not current smoker   Pulmonary exam normal breath sounds clear to auscultation       Cardiovascular Exercise Tolerance: Good hypertension, Pt. on medications (-) CAD and (-) Past MI (-) dysrhythmias  Rhythm:Regular Rate:Normal - Systolic murmurs    Neuro/Psych  PSYCHIATRIC DISORDERS Anxiety Depression    negative neurological ROS     GI/Hepatic ,GERD  Medicated and Controlled,,(+)     (-) substance abuse    Endo/Other  neg diabetes    Renal/GU      Musculoskeletal   Abdominal   Peds  Hematology   Anesthesia Other Findings Past Medical History: No date: Arthritis No date: BPH (benign prostatic hyperplasia) No date: Complication of anesthesia     Comment:  epidural did not work ended up with general and now               trouble w/ urinatiing needing laser enucleation of               prastate No date: GERD (gastroesophageal reflux disease) No date: Hyperlipemia No date: Hypertension No date: OSA on CPAP No date: Restless leg syndrome  Reproductive/Obstetrics                               Anesthesia Physical Anesthesia Plan  ASA: 2  Anesthesia Plan: General ETT   Post-op Pain Management:    Induction: Intravenous  PONV Risk Score and Plan: 3 and Ondansetron , Dexamethasone  and Midazolam   Airway Management Planned: Oral ETT  Additional Equipment:   Intra-op Plan:   Post-operative Plan: Extubation in OR  Informed Consent: I have reviewed the patients History and Physical, chart, labs and discussed the procedure including the risks, benefits and alternatives for the proposed anesthesia with the patient or authorized representative who has indicated his/her understanding and acceptance.     Dental Advisory Given  Plan Discussed with: Anesthesiologist, CRNA and Surgeon  Anesthesia Plan Comments: (Patient consented for risks of anesthesia including but not limited to:  - adverse reactions to medications - damage to eyes, teeth, lips or other oral mucosa - nerve damage due to positioning  - sore throat or hoarseness - Damage to heart, brain, nerves, lungs, other parts of body or loss of life  Patient voiced understanding and assent.)        Anesthesia Quick Evaluation

## 2023-11-12 NOTE — Anesthesia Postprocedure Evaluation (Signed)
 Anesthesia Post Note  Patient: Brandon Riley  Procedure(s) Performed: CYSTOSCOPY, WITH STENT INSERTION (Left: Ureter)  Patient location during evaluation: PACU Anesthesia Type: General Level of consciousness: awake and alert Pain management: pain level controlled Vital Signs Assessment: post-procedure vital signs reviewed and stable Respiratory status: spontaneous breathing, nonlabored ventilation, respiratory function stable and patient connected to nasal cannula oxygen Cardiovascular status: blood pressure returned to baseline and stable Postop Assessment: no apparent nausea or vomiting Anesthetic complications: no   No notable events documented.   Last Vitals:  Vitals:   11/12/23 1345 11/12/23 1400  BP: 127/83 109/64  Pulse: 78 62  Resp: 13 12  Temp:    SpO2: 100% 99%    Last Pain:  Vitals:   11/12/23 1345  TempSrc:   PainSc: Asleep                 Debby Mines

## 2023-11-13 ENCOUNTER — Other Ambulatory Visit: Payer: Self-pay | Admitting: Urology

## 2023-11-13 ENCOUNTER — Telehealth: Payer: Self-pay

## 2023-11-13 ENCOUNTER — Encounter: Payer: Self-pay | Admitting: Urology

## 2023-11-13 DIAGNOSIS — N1330 Unspecified hydronephrosis: Principal | ICD-10-CM

## 2023-11-13 DIAGNOSIS — I10 Essential (primary) hypertension: Secondary | ICD-10-CM | POA: Diagnosis not present

## 2023-11-13 DIAGNOSIS — Z96652 Presence of left artificial knee joint: Secondary | ICD-10-CM | POA: Diagnosis not present

## 2023-11-13 MED ORDER — DIAZEPAM 10 MG PO TABS: 10.0000 mg | ORAL_TABLET | Freq: Once | ORAL | 0 refills | Status: DC | PRN

## 2023-11-13 MED ORDER — TRAMADOL HCL 50 MG PO TABS: 25.0000 mg | ORAL_TABLET | Freq: Four times a day (QID) | ORAL | 0 refills | Status: AC | PRN

## 2023-11-13 NOTE — Telephone Encounter (Signed)
 Pt walked into clinic to schedule appt. Confirmed for Monday 11/16/23 @ 8:30 AM

## 2023-11-13 NOTE — Discharge Summary (Signed)
 Physician Discharge Summary  Patient ID: Brandon Riley MRN: [id number] DOB/AGE: February 09, 1961 63 y.o.  Admit date: 11/12/2023 Discharge date: 11/13/2023  Admission Diagnoses: Left hydronephrosis  Discharge Diagnoses: Same  Discharged Condition: good  Hospital Course:   Patient with complicated urologic history including HOLEP on 09/25/2023, multiple hospitalizations at outside hospital afterwards for gross hematuria/clot retention, abdominal pain, CT showing new left hydronephrosis down to the bladder, as well as a new left-sided retroperitoneal fluid collection.  He ultimately underwent IR drain placement in the retroperitoneal fluid collection.  This was felt to be from obstruction with forniceal rupture, drain fluid was consistent with urine.  On 11/12/2023 he underwent cystoscopy and resection of the left ureteral orifice which was opened, and a stent was placed.  Retrograde pyelogram at that time did not show connection between the kidney and the drain.   On postop day #1 urine in the Foley catheter was clear yellow, drain output had decreased to 0 after stent placement, and pain was well-controlled.  Treatments: surgery: Left ureteral stent placement, left retrograde pyelogram  Discharge Exam: Blood pressure 126/86, pulse 68, temperature (!) 97.5 F (36.4 C), temperature source Oral, resp. rate 16, height 5' 7 (1.702 m), weight 96.2 kg, SpO2 98%.  Alert, no acute distress Flank drain with no output Abdomen soft and nontender Foley with clear yellow urine  Disposition: Discharge disposition: 01-Home or Self Care       Discharge Instructions     Discharge instructions   Complete by: As directed    You underwent left ureteral stent placement.  Keep your flank drain to drainage, but do not put on suction.  It is normal to intermittently see some pink to red urine in the catheter, drink plenty of fluids to keep your bladder flushed.  You will have your catheter removed Monday  with Dr. Roseann, my clinic will coordinate removal of your drain likely towards the end of next week, and stent removal in my clinic in [facility] or [place] in 2 to 3 weeks.  If you have fever over 101, uncontrolled pain, or catheter is not draining please present to the ER or call the clinic.   Discharge patient   Complete by: As directed    Discharge disposition: 01-Home or Self Care   Discharge patient date: 11/13/2023      Allergies as of 11/13/2023       Reactions   Celebrex [celecoxib] Palpitations   Heavy breathing   Flexeril [cyclobenzaprine] Hives   Iodinated Contrast Media Rash   Rash for 3 days on arm where IV was placed and then next day it spread to the body        Medication List     TAKE these medications    acetaminophen  500 MG tablet Commonly known as: TYLENOL  Take 1,000 mg by mouth every 6 (six) hours as needed for mild pain (pain score 1-3) or moderate pain (pain score 4-6).   ALPRAZolam  0.5 MG tablet Commonly known as: XANAX  Take 1 tablet (0.5 mg total) by mouth 2 (two) times daily as needed for anxiety.   docusate sodium  50 MG capsule Commonly known as: COLACE Take 50 mg by mouth 2 (two) times daily.   fluconazole  100 MG tablet Commonly known as: DIFLUCAN  Take 2 tablets (200 mg total) by mouth daily. X 7 days   MAGNESIUM  PO Take 1 tablet by mouth every other day.   metoprolol  tartrate 25 MG tablet Commonly known as: LOPRESSOR  Take 1 tablet (25 mg total) by  mouth 2 (two) times daily.   Nystatin  Powd Apply liberally to affected area 2 times per day What changed:  how much to take how to take this when to take this reasons to take this additional instructions   polyethylene glycol 17 g packet Commonly known as: MIRALAX  / GLYCOLAX  Take 17 g by mouth daily as needed for moderate constipation.   rOPINIRole  1 MG tablet Commonly known as: REQUIP  Take 3 tablets (3 mg total) by mouth at bedtime.   rosuvastatin  10 MG tablet Commonly known  as: CRESTOR  TAKE ONE TABLET BY MOUTH DAILY What changed: when to take this   traMADol  50 MG tablet Commonly known as: Ultram  Take 0.5-1 tablets (25-50 mg total) by mouth every 6 (six) hours as needed for up to 5 days for severe pain (pain score 7-10).   VITAMIN C PO Take 1 tablet by mouth every other day.   VITAMIN D PO Take 1 capsule by mouth every other day.         Greater than 30 minutes were spent on the floor with greater than 50% spent on patient counseling and care regarding hospital course, follow-up plan, and discharge instructions.  Signed: Redell JAYSON Burnet 11/13/2023, 10:37 AM

## 2023-11-13 NOTE — Telephone Encounter (Signed)
-----   Message from Brandon Riley sent at 11/13/2023 12:50 PM EDT ----- Please add this patient on for a voiding trial on Monday 7/21.  He could come in at 8:30. Please call to confirm appt time.

## 2023-11-13 NOTE — TOC CM/SW Note (Signed)
     [facility] REFERRAL        Occupational Therapy * Physical Therapy * Speech Therapy                           DATE 11/13/2023  PATIENT NAME Brandon Riley PATIENT MRN [id number]       DIAGNOSIS/DIAGNOSIS CODE N13.30  DATE OF DISCHARGE: 11/13/2023       PRIMARY CARE PHYSICIAN    Debby Petties, MD  PCP PHONE/FAX Phone: [phone]     Dear Provider (Name: [facility]  Fax: [phone]   I certify that I have examined this patient and that occupational/physical/speech therapy is necessary on an outpatient basis.    The patient has expressed interest in completing their recommended course of therapy at your  location.  Once a formal order from the patient's primary care physician has been obtained, please  contact him/her to schedule an appointment for evaluation at your earliest convenience.   [ x]  Physical Therapy Evaluate and Treat  [  ]  Occupational Therapy Evaluate and Treat  [  ]  Speech Therapy Evaluate and Treat         The patient's primary care physician (listed above) must furnish and be responsible for a formal order such that the recommended services may be furnished while under the primary physician's care, and that the plan of care will be established and reviewed every 30 days (or more often if condition necessitates).

## 2023-11-13 NOTE — TOC CM/SW Note (Addendum)
 Transition of Care [facility]) - Inpatient Brief Assessment   Patient Details  Name: Brandon Riley MRN: [id number] Date of Birth: 01/22/1961  Transition of [facility]) CM/SW Contact:    Lauraine JAYSON Carpen, LCSW Phone Number: 11/13/2023, 10:55 AM   Clinical Narrative: Patient has orders to discharge home today. Chart reviewed. Patient goes to outpatient PT at [facility]. CSW called and confirmed they will need a new order for him to resume services. CSW asked MD to [place]. Will fax once completed. No further concerns. CSW signing off.  12:57 pm: Faxed order to resume outpatient PT.  Transition of Care Asessment: Insurance and Status: Insurance coverage has been reviewed Patient has primary care physician: Yes Home environment has been reviewed: Single family home Prior level of function:: Not documented Prior/Current Home Services: No current home services Social Drivers of Health Review: SDOH reviewed no interventions necessary Readmission risk has been reviewed: Yes Transition of care needs: no transition of care needs at this time

## 2023-11-16 ENCOUNTER — Ambulatory Visit: Admitting: Urology

## 2023-11-16 ENCOUNTER — Encounter: Payer: Self-pay | Admitting: Urology

## 2023-11-16 VITALS — BP 144/91 | HR 87

## 2023-11-16 DIAGNOSIS — N401 Enlarged prostate with lower urinary tract symptoms: Secondary | ICD-10-CM

## 2023-11-16 DIAGNOSIS — N138 Other obstructive and reflux uropathy: Secondary | ICD-10-CM

## 2023-11-16 DIAGNOSIS — N1330 Unspecified hydronephrosis: Secondary | ICD-10-CM

## 2023-11-16 NOTE — Progress Notes (Signed)
 Assessment: 1. Hydronephrosis of left kidney   2. Benign prostatic hyperplasia with urinary obstruction     Plan: Foley catheter removed today. Arrangements are being made for interventional radiology to remove the percutaneous drain. He will follow-up with Dr. Francisca in 2-3 weeks for stent removal.  Chief Complaint:  Chief Complaint  Patient presents with   Hydronephrosis    History of Present Illness:  Brandon Riley is a 62 y.o. male who is seen for continued evaluation of BPH with obstruction, gross hematuria, history of urinary retention, left hydronephrosis, left perinephric fluid collection.  He reported no problems with urination prior to his surgery.  He previously took tamsulosin  for a short period of time due to increased urinary symptoms.  However, he had discontinued this prior to his recent episode. He underwent a knee arthroplasty on 07/20/2023 and developed postoperative urinary retention.  There was an attempt at spinal anesthesia. He had a Foley catheter placed and was discharged home with a catheter and on tamsulosin .  He presented to [facility] ER on 3/27 with problems with the Foley catheter drainage.  He was found to have some small clots in the catheter.  His Foley was removed at [facility] on 07/30/2023.  He was unable to void following removal and return to the emergency room later that evening.  A Foley catheter was replaced on 07/31/2023 with return of 2000 mL of urine.  He continued on tamsulosin  0.8 mg daily.  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  He has a history of an elevated PSA. PSA results: 10/21 1.2 5/22 6.0  23% free 6/22 6.5  28% free 3/23 5.0  32% free 6/23 4.9  41% free 6/24 4.8  31% free 1/25 5.5  28.5% free  He underwent a prostate biopsy in October 2022 by Dr. Norleen Blush with [facility].  Pathology showed benign prostate tissue with atrophy and associated mixed inflammation. Prostate volume:  125.9 g  His Foley catheter was removed on 08/10/2023 after a successful voiding trial in the office.  Unfortunately, he had difficulty voiding later that day and return to the emergency room where a Foley catheter was placed.  He was changed to silodosin  at that time.   Cystoscopy from 08/20/2023 showed lateral lobe enlargement of the prostate. He was unable to void later that day and required placement of a Foley catheter in the emergency room.  He has undergone a HoLEP by Dr. Francisca on 09/25/2023.  A total of 105 g of prostate tissue was removed.  Path showed BPH. He was admitted to the hospital on 09/26/2023 for abdominal pain and AKI.  Evaluation determined that the Foley catheter had pulled into the prostatic fossa.  He was also found to have mild left hydronephrosis.  He was admitted to the hospital for management of his abdominal pain with associated nausea and constipation.  His renal function gradually improved.  His constipation was aggressively managed.  He was discharged on 10/01/2023. His Foley catheter was removed on 10/07/2023 after a successful voiding trial in the office. He developed some gross hematuria with clots. This apparently occurred after straining with a bowel movement.  He had difficulty voiding and was seen in the emergency room in [place].  A Foley catheter was placed and the bladder was irrigated with return of clots.  His Foley  was removed on 10/07/2023 after a successful voiding trial in the office. He was admitted to [facility] on 10/14/2023 due to gross hematuria and urinary retention.  He again presented to [facility] in [place] on 10/14/2023.  He was started on continuous bladder irrigation and given TXA.  The Foley catheter was removed on hospital day #2 and he was voiding without difficulty.  The gross hematuria had  significantly cleared. CT abdomen and pelvis without contrast from 10/14/2023 showed moderate to severe left hydronephrosis increased from 09/27/2023, no obstructing stone, dilation of the left ureter to the UVJ.  There is also evidence of loculated ascites in the left upper quadrant.  Creatinine on 10/14/2023 was 1.36. He again presented to the emergency room in [place] on 10/20/2023 with generalized abdominal pain and passage of a blood clot. CT abdomen and pelvis without contrast from 10/20/2023 showed moderate left hydronephrosis and hydroureter without evidence of an obstructing stone, and a left perinephric fluid collection measuring 7.1 x 3.8 cm. Urine culture showed no growth.  Creatinine 1.67. He underwent placement of a percutaneous drain by interventional radiology on 10/29/2023.  Cultures have shown no growth to date.  Cytology pending.  At his visit on 11/03/23, he was feeling much better after placement of the percutaneous drain.  His abdominal pain had resolved.  He was having fairly regular bowel movements.  No nausea or vomiting.  The drain was putting out clear fluid with outputs between [phone] mL / 24 hours.  He was voiding spontaneously.  He continued to have some intermittent gross hematuria with passage of clots.  No further episodes of retention. Drain fluid was consistent with urine. He he underwent cystoscopy with resection of the left ureteral orifice and placement of a left ureteral stent by Dr.Sninsky on 11/12/2023. He presents today for Foley catheter removal. His Foley has been draining well.  He has not had any significant output from the percutaneous drain.  He continues on fluconazole .  Portions of the above documentation were copied from a prior visit for review purposes only.   Past Medical History:  Past Medical History:  Diagnosis Date   Arthritis    BPH (benign prostatic hyperplasia)    Complication of anesthesia    epidural did not work ended up with general  and now trouble w/ urinatiing needing laser enucleation of prastate   GERD (gastroesophageal reflux disease)    Hydronephrosis of left kidney    Hyperlipemia    Hypertension    OSA on CPAP    Restless leg syndrome     Past Surgical History:  Past Surgical History:  Procedure Laterality Date   CHONDROPLASTY Left 10/28/2018   Procedure: CHONDROPLASTY;  Surgeon: Cristy Bonner DASEN, MD;  Location: [facility];  Service: Orthopedics;  Laterality: Left;   CYSTOSCOPY WITH STENT PLACEMENT Left 11/12/2023   Procedure: CYSTOSCOPY, WITH STENT INSERTION;  Surgeon: Francisca Redell BROCKS, MD;  Location: [facility];  Service: Urology;  Laterality: Left;   HOLEP-LASER ENUCLEATION OF THE PROSTATE WITH MORCELLATION N/A 09/25/2023   Procedure: ENUCLEATION, PROSTATE, USING LASER, WITH MORCELLATION;  Surgeon: Francisca Redell BROCKS, MD;  Location: [facility];  Service: Urology;  Laterality: N/A;   IR RADIOLOGIST EVAL & MGMT  11/09/2023   KNEE ARTHROSCOPY WITH MEDIAL MENISECTOMY Left 10/28/2018   Procedure: LEFT KNEE ARTHROSCOPY WITH MEDIAL MENISECTOMY;  Surgeon: Cristy Bonner DASEN, MD;  Location: [facility];  Service: Orthopedics;  Laterality: Left;   RECONSTRUCTION OF NOSE  2019  TOTAL KNEE ARTHROPLASTY Left 07/20/2023   Procedure: ARTHROPLASTY, KNEE, TOTAL;  Surgeon: Edna Toribio LABOR, MD;  Location: [facility];  Service: Orthopedics;  Laterality: Left;   WISDOM TOOTH EXTRACTION  2012    Allergies:  Allergies  Allergen Reactions   Celebrex [Celecoxib] Palpitations    Heavy breathing   Flexeril [Cyclobenzaprine] Hives   Iodinated Contrast Media Rash    Rash for 3 days on arm where IV was placed and then next day it spread to the body    Family History:  Family History  Problem Relation Age of Onset   Dementia Mother    Heart Problems Mother        Double bypass   Diabetes Mother    Other Father        Prostate Issues    Social History:  Social History   Tobacco Use   Smoking status: Never     Passive exposure: Never   Smokeless tobacco: Never  Vaping Use   Vaping status: Never Used  Substance Use Topics   Alcohol  use: Not Currently    Comment: 2-3x per week   Drug use: Never    ROS: Constitutional:  Negative for fever, chills, weight loss CV: Negative for chest pain, previous MI, hypertension Respiratory:  Negative for shortness of breath, wheezing, sleep apnea, frequent cough GI:  Negative for nausea, vomiting, bloody stool, GERD  Physical exam: BP (!) 144/91   Pulse 87  GENERAL APPEARANCE:  Well appearing, well developed, well nourished, NAD HEENT:  Atraumatic, normocephalic, oropharynx clear NECK:  Supple without lymphadenopathy or thyromegaly ABDOMEN:  Soft, non-tender, no masses EXTREMITIES:  Moves all extremities well, without clubbing, cyanosis, or edema NEUROLOGIC:  Alert and oriented x 3, normal gait, CN II-XII grossly intact MENTAL STATUS:  appropriate BACK:  Non-tender to palpation, No CVAT SKIN:  Warm, dry, and intact  Results: None  Procedure:  VOIDING TRIAL  A voiding trial was performed in the office today.   Volume of sterile water  instilled: 220 mL Foley catheter removed intact. Volume voided by patient: 220 mL Instructed to return to office if has not voided by 4 PM

## 2023-11-16 NOTE — Progress Notes (Signed)
 Fill and Pull Catheter Removal  Patient is present today for a catheter removal.  Patient was cleaned and prepped in a sterile fashion of sterile water  was instilled into the bladder when the patient felt the urge to urinate, 16mL of water  was then drained from the balloon.  A 22FR foley cath was removed from the bladder no complications were noted .  Patient was then given some time to void on their own.  Patient can void  on their own after some time.  Patient tolerated well.  Performed by: Zian Delair CMA

## 2023-11-17 ENCOUNTER — Telehealth (INDEPENDENT_AMBULATORY_CARE_PROVIDER_SITE_OTHER): Admitting: Urology

## 2023-11-17 ENCOUNTER — Other Ambulatory Visit: Payer: Self-pay | Admitting: Neurology

## 2023-11-17 ENCOUNTER — Encounter: Payer: Self-pay | Admitting: Urology

## 2023-11-17 ENCOUNTER — Telehealth: Payer: Self-pay | Admitting: Sports Medicine

## 2023-11-17 DIAGNOSIS — N1330 Unspecified hydronephrosis: Secondary | ICD-10-CM

## 2023-11-17 DIAGNOSIS — Z4889 Encounter for other specified surgical aftercare: Secondary | ICD-10-CM

## 2023-11-17 DIAGNOSIS — G2581 Restless legs syndrome: Secondary | ICD-10-CM

## 2023-11-17 DIAGNOSIS — Z87448 Personal history of other diseases of urinary system: Secondary | ICD-10-CM

## 2023-11-17 DIAGNOSIS — N2889 Other specified disorders of kidney and ureter: Secondary | ICD-10-CM

## 2023-11-17 NOTE — Telephone Encounter (Signed)
 Brandon Riley has a retroperitoneal drain, since his most recent surgery output has been 0, referral back to interventional radiology for removal as I have never removed one of these before.

## 2023-11-17 NOTE — Progress Notes (Signed)
 Virtual Visit via Telephone Note  I connected with Brandon Riley on 11/17/23 at 11:00 AM EDT by telephone and verified that I am speaking with the correct person using two identifiers.   Patient location: Home Provider location: [facility]rologic Office   I discussed the limitations, risks, security and privacy concerns of performing an evaluation and management service by telephone and the availability of in person appointments. We discussed the impact of the COVID-19 pandemic on the healthcare system, and the importance of social distancing and reducing patient and provider exposure. I also discussed with the patient that there may be a patient responsible charge related to this service. The patient expressed understanding and agreed to proceed.  Reason for visit: Discuss drain management  History of Present Illness:  Patient with complicated urologic history including HOLEP on 09/25/2023 for Foley dependent urinary retention, multiple hospitalizations at outside hospital afterwards for gross hematuria/clot retention, abdominal pain, CT showing new left hydronephrosis down to the bladder, as well as a new left-sided retroperitoneal fluid collection.  He ultimately underwent IR drain placement in the retroperitoneal fluid collection.  This was felt to be from obstruction with forniceal rupture, drain fluid was consistent with urine.   On 11/12/2023 he underwent cystoscopy and transurethral resection of the left ureteral orifice which was opened, and a stent was placed.  Retrograde pyelogram at that time did not show connection between the kidney and the drain.  He was discharged on postoperative day #1, urine was clear yellow.  His Foley catheter was removed 7/21 locally with Dr. Roseann.  He has been voiding well without any issue, continues to have no output from the flank drain that is to gravity drainage.  He has an appointment tomorrow with his PCP who will help coordinate drain removal  with IR.  They may want to investigate the drain with contrast, but I feel fairly comfortable this can be removed based on the 0ml output since stent was placed.  He has stent removal scheduled with me 8/7.  He is tolerating the stent well.  Denies any gross hematuria at this time.     Follow Up:  Has appointment with PCP tomorrow to help coordinate IR drain removal Keep follow-up as scheduled for stent removal on 12/03/2023, has Valium  10 mg to take prior   I discussed the assessment and treatment plan with the patient. The patient was provided an opportunity to ask questions and all were answered. The patient agreed with the plan and demonstrated an understanding of the instructions.   The patient was advised to call back or seek an in-person evaluation if the symptoms worsen or if the condition fails to improve as anticipated.  I provided 8 minutes of non-face-to-face time during this encounter.   Redell JAYSON Burnet, MD

## 2023-11-18 ENCOUNTER — Ambulatory Visit
Admission: RE | Admit: 2023-11-18 | Discharge: 2023-11-18 | Disposition: A | Discharge status: Home / Self Care | Source: Ambulatory Visit | Attending: Sports Medicine | Admitting: Sports Medicine

## 2023-11-18 ENCOUNTER — Ambulatory Visit

## 2023-11-18 ENCOUNTER — Ambulatory Visit: Admitting: Sports Medicine

## 2023-11-18 DIAGNOSIS — M6281 Muscle weakness (generalized): Secondary | ICD-10-CM

## 2023-11-18 DIAGNOSIS — R2689 Other abnormalities of gait and mobility: Secondary | ICD-10-CM | POA: Diagnosis not present

## 2023-11-18 DIAGNOSIS — M25562 Pain in left knee: Secondary | ICD-10-CM

## 2023-11-18 DIAGNOSIS — N2889 Other specified disorders of kidney and ureter: Secondary | ICD-10-CM

## 2023-11-18 DIAGNOSIS — Z4803 Encounter for change or removal of drains: Secondary | ICD-10-CM | POA: Diagnosis not present

## 2023-11-18 HISTORY — PX: IR RADIOLOGIST EVAL & MGMT: IMG5224

## 2023-11-18 NOTE — Therapy (Signed)
 OUTPATIENT PHYSICAL THERAPY LOWER EXTREMITY TREATMENT  PHYSICAL THERAPY DISCHARGE SUMMARY  Visits from Start of Care: 19  Current functional level related to goals / functional outcomes: All goals met   Remaining deficits: N/A    Education / Equipment: See education below    Patient agrees to discharge. Patient goals were met. Patient is being discharged due to meeting the stated rehab goals.  Patient Name: Brandon Riley MRN: [id number] DOB:Aug 31, 1960, 63 y.o., male Today's Date: 11/18/2023  END OF SESSION:  PT End of Session - 11/18/23 0931     Visit Number 19    Number of Visits 25    Date for PT Re-Evaluation 11/22/23    Authorization Type aetna    Authorization - Number of Visits 30   per insurance total for the year   PT Start Time 0931    PT Stop Time 1013    PT Time Calculation (min) 42 min    Activity Tolerance Patient tolerated treatment well    Behavior During Therapy WFL for tasks assessed/performed             Past Medical History:  Diagnosis Date   Allergy Seasonal   Grass & pollens   Arthritis Knees & areas   BPH (benign prostatic hyperplasia)    Complication of anesthesia    epidural did not work ended up with general and now trouble w/ urinatiing needing laser enucleation of prastate   GERD (gastroesophageal reflux disease)    Hydronephrosis of left kidney    Hyperlipemia    Hypertension    OSA on CPAP    Restless leg syndrome    Sleep apnea    Severe sleep apnea   Past Surgical History:  Procedure Laterality Date   CHONDROPLASTY Left 10/28/2018   Procedure: CHONDROPLASTY;  Surgeon: Cristy Bonner DASEN, MD;  Location: [facility];  Service: Orthopedics;  Laterality: Left;   CYSTOSCOPY WITH STENT PLACEMENT Left 11/12/2023   Procedure: CYSTOSCOPY, WITH STENT INSERTION;  Surgeon: Francisca Redell BROCKS, MD;  Location: [facility];  Service: Urology;  Laterality: Left;   HOLEP-LASER ENUCLEATION OF THE PROSTATE WITH MORCELLATION N/A 09/25/2023    Procedure: ENUCLEATION, PROSTATE, USING LASER, WITH MORCELLATION;  Surgeon: Francisca Redell BROCKS, MD;  Location: [facility];  Service: Urology;  Laterality: N/A;   IR RADIOLOGIST EVAL & MGMT  11/09/2023   JOINT REPLACEMENT  07/20/23   Left knee   KNEE ARTHROSCOPY WITH MEDIAL MENISECTOMY Left 10/28/2018   Procedure: LEFT KNEE ARTHROSCOPY WITH MEDIAL MENISECTOMY;  Surgeon: Cristy Bonner DASEN, MD;  Location: [facility];  Service: Orthopedics;  Laterality: Left;   RECONSTRUCTION OF NOSE  2019   TOTAL KNEE ARTHROPLASTY Left 07/20/2023   Procedure: ARTHROPLASTY, KNEE, TOTAL;  Surgeon: Edna Toribio LABOR, MD;  Location: [facility];  Service: Orthopedics;  Laterality: Left;   WISDOM TOOTH EXTRACTION  2012   Patient Active Problem List   Diagnosis Date Noted   Perinephric fluid collection 11/03/2023   Abdominal fluid collection 10/23/2023   Intertrigo 10/19/2023   Hydronephrosis 10/14/2023   AKI (acute kidney injury) (HCC) 09/27/2023   Hypersomnia 09/27/2023   Ileus, postoperative (HCC) 09/27/2023   Hydronephrosis of left kidney 09/27/2023   Malignant neoplasm of spine (HCC) 08/31/2023   Sacral lesion 08/04/2023   Urinary retention 08/03/2023   History of total knee arthroplasty, left 07/20/2023   Preoperative clearance 05/12/2023   COVID-19 03/30/2023   Restless leg syndrome 10/08/2022   Dermatitis 09/01/2022   Prediabetes 07/09/2021  BPH (benign prostatic hyperplasia) 06/12/2021   Elevated PSA 09/07/2020   Polyarthralgia 08/31/2020   Steatohepatitis 05/16/2020   Open fracture of left thumb 01/31/2020   GERD (gastroesophageal reflux disease) 11/07/2016   Primary osteoarthritis of right wrist 09/29/2016   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   Left insertional Achilles tendinosis 11/05/2015   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   Obstructive sleep apnea on BiPAP with periodic limb movement disorder 06/05/2014   Obesity 05/02/2013   Annual physical exam 04/04/2013    Primary osteoarthritis of both knees with pseudogout 04/04/2013   Degenerative disc disease, cervical 04/04/2013   Hyperlipidemia 04/04/2013   Benign essential hypertension 04/04/2013   Lumbar degenerative disc disease 04/04/2013    PCP: Curtis Ned MD REFERRING PROVIDER: Edna Sieving, MD REFERRING DIAG:  Diagnosis  [phone] (ICD-10-CM) - S/P total knee replacement   THERAPY DIAG:  Acute pain of left knee  Other abnormalities of gait and mobility  Muscle weakness (generalized)  Rationale for Evaluation and Treatment: Rehabilitation  ONSET DATE: 07/20/23 surgical date  SUBJECTIVE:  SUBJECTIVE STATEMENT: Patient had stent placed on 11/12/23 and found scar tissue that was blocking urethra. He stayed overnight at hospital for observation. He has plans to have catheter removed today and on 8/7 the stent will be removed.  Overall the knee is feeling good, just tightness.   EVAL:The patient reports both knees are shot having meniscus repair in the past. He expects pain and is ready to start rehab. He had 1.5 years of pain prior to surgery. His surgery was on 07/20/23 for L TKR and post surgical course was complicated by urinary retention-- he is using a foley cath at this time and has appt with urology next week. He is using ice for pain control and meds.   PERTINENT HISTORY: Covid, RLS, OSA on bipap, HTN, DDD  PAIN:  Are you having pain? No  PRECAUTIONS: with catheter in place no strenuous activity or activity that pulls on the drain   WEIGHT BEARING RESTRICTIONS: WBAT  FALLS:  Has patient fallen in last 6 months? Yes, 3--felt balance was off (works on this at [employer])  LIVING ENVIRONMENT: Lives with: lives with their spouse Lives in: House/apartment Stairs: Yes: Internal: 17, basement steps; one rail and 2 walls and External: 2 steps; none Has following equipment at home: Walker - 2 wheeled  PATIENT GOALS: Good quality of life-- be able to do 7/8 of what I  used to do.  NEXT MD VISIT: 10/01/23  OBJECTIVE:  Note: Objective measures were completed at Evaluation unless otherwise noted. PATIENT SURVEYS:  LEFS 10% COGNITION: Overall cognitive status: Within functional limits for tasks assessed   EDEMA:  Circumferential: 37 on R and 44 on L PALPATION: Edema L knee with dressing intact LOWER EXTREMITY ROM: Active ROM Left eval Left 07/27/23 Left 08/03/23 08/13/23 Left  08/17/23 Left 08/20/23 Left  08/24/23 Left 08/26/23 09/18/23 Left 09/23/23 10/08/23 Left AROM 10/21/23 Left AROM 11/18/23 Left AROM  Hip flexion               Hip extension               Hip abduction               Hip adduction               Hip internal rotation               Hip external rotation  Knee flexion AROM 75 deg heel slide AAROM 94 deg AAROM 102 deg supine  Sitting AAROM 103 deg 90 AROM 104 AROM heel slide 105 110  111 PROM 108 AROM 112 degrees AAROM knee to chest 110 122 degrees 125  Knee extension -9 PROM -12 AROM  -6 deg supine   Lacking 4 in supine  2 degrees (from full extension) prone hang     0  Ankle dorsiflexion               Ankle plantarflexion               Ankle inversion               Ankle eversion                (Blank rows = not tested) LOWER EXTREMITY MMT: MMT Right eval Left eval Left 08/17/23 10/08/23 Left  11/18/23 Left   Hip flexion    4- knee pain 5  Hip extension       Hip abduction       Hip adduction       Hip internal rotation       Hip external rotation       Knee flexion    4+ 5  Knee extension  LAQ to -38 Extensor Lag -20 4 5   Ankle dorsiflexion       Ankle plantarflexion       Ankle inversion       Ankle eversion        (Blank rows = not tested) FUNCTIONAL TESTS:  Gait speed = 1.23 ft/sec GAIT: Distance walked: 100 ft Assistive device utilized: Environmental consultant - 2 wheeled Level of assistance: Modified independence Comments: stairs with RW since no rails at home with step to pattern and SBA for safety [facility]  Adult PT Treatment:                                                DATE: 11/18/23 Therapeutic Exercise: Reviewed and updated HEP discussing frequency, sets, reps, and ways to progress  Therapeutic Activity: Re-assessment to determine overall progress, educating patient on progress towards goals   Self Care: Discussed gym equipment to utilize and gradual progression back into this activity once cleared by urology.   [facility] Adult PT Treatment:                                                DATE: 11/04/23 Therapeutic Exercise: Modified butterfly stretch 2 x 30 sec  Figure 4 stretch 2 x 30 sec  LAQ 2 x 10; 4 lbs  Seated figure 4 stretch partial range 2 x 10 sec Standing quad stretch 2 x 30 sec Reviewed HEP discussing activities to avoid with catheter  Therapeutic Activity: Step up knee driver 2 x 10; 6 inch    [facility] Adult PT Treatment:                                                DATE: 10/21/23 Therapeutic Exercise: Supine Knee to chest physioball roll Rocking lumbar spine with ball  Bridges x 10 reps  Heel slide and knee to chest for end range measurements-- noted improvement in flexion Attempted L heel to R knee in hooklying-- unable Bent knee fallouts Hip adductor stretch R and L sides within tolerable ROM Gait: 20/10.97=1.82 ft/sec for gait speed  200 feet ambulation at self chosen pace with good stride length Self Care: Discussed current HEP and recommended walking program as main focus to maintain mobility Also discussed that if any exercises increase abdominal pain, then discontinue at this time.    PATIENT EDUCATION:  Education details: see treatment; d/c education Person educated: Patient,  Education method: Explanation, demo, handout Education comprehension: verbalized understanding,  HOME EXERCISE PROGRAM: Access Code: [email or web address] URL: [email or web address] Date: 11/18/2023 Prepared by: Lucie Meeter  Exercises - Supine Straight Leg Raises  - 1 x  daily - 3 x weekly - 2 sets - 10 reps - Heel Raises with Counter Support  - 1 x daily - 3 x weekly - 2 sets - 12 reps - Standing Terminal Knee Extension with Resistance  - 1 x daily - 3 x weekly - 2 sets - 10 reps - Standing Hip Abduction with Counter Support  - 1 x daily - 3 x weekly - 2 sets - 10 reps - Standing Hip Extension with Counter Support  - 1 x daily - 3 x weekly - 2 sets - 10 reps - Tandem Stance  - 1 x daily - 3 x weekly - 3 sets - 30 sec  hold - Single Leg Stance  - 1 x daily - 3 x weekly - 3 sets - 30 sec  hold - Hip Adductors and Hamstring Stretch with Strap  - 1 x daily - 7 x weekly - 1 sets - 3 reps - 30 seconds hold - Standing Hamstring Stretch with Step  - 1 x daily - 7 x weekly - 1 sets - 3 reps - 30 seconds hold - Supine Butterfly Groin Stretch  - 1 x daily - 7 x weekly - 3 sets - 10 reps - Seated Figure 4 Piriformis Stretch  - 1 x daily - 7 x weekly - 3 sets - 30 sec  hold  ASSESSMENT:  CLINICAL IMPRESSION: Elisa has made good progress overall in PT s/p Lt TKA on 07/20/23 despite multiple setbacks due to complications of urinary retention. He demonstrates functional and pain free Lt knee AROM and full Lt knee strength. He has met all established functional goals and is awaiting further procedures/clearance from a urology standpoint before he can fully return to the gym. He demonstrates independence with advanced home program and is appropriate for discharge at this time with patient in agreement with this plan.   EVAL: Patient is a 63 y.o. male who was seen today for physical therapy evaluation and treatment for L TKR on 07/20/23. He presents today with impairments in ROM, strength, edema, pain, and functional limitations s/p surgery. Patient's hospital course complicated by urinary retention. He is f/u in OP with urology. PT to progress patient to tolerance with goal of returning to prior functional status with reduced pain.    OBJECTIVE IMPAIRMENTS: Abnormal gait, decreased  activity tolerance, decreased balance, decreased endurance, decreased ROM, decreased strength, hypomobility, increased edema, increased fascial restrictions, impaired flexibility, and pain.   GOALS: Goals reviewed with patient? Yes UPDATED GOALS:  SHORT TERM GOALS: Target date: 10/23/23  The patient will be indep with HEP progression. Baseline: has HEP-- has been limited in performing this week due to other medical  appts and procedures Goal status: MET  2.   The patient will demonstrate reciprocal pattern with descending stairs. Baseline:  step to pattern to descend/ able to ascend with reciprocal pattern. 11/18/23: reciprocal ascent and descent  Goal status:MET  LONG TERM GOALS: Target date: 11/22/23  The patient will return to gym routine for post d/c activities. Baseline: limited exercises at gym 11/18/23: not limited due to knee, plans to return to gym once catheter removed/urology clearance Goal status: MET  2.  The patient will improve LEFS up to 50% to demonstrate improved functional abilities. Baseline: 42.5% ON 09/23/23 11/18/23: 73.8% Goal status: MET   3.  The patient will improve AROM L knee to 115 degrees Baseline:  112 degrees Goal status : MET  4.  The patient will normalize gait mechanics. Baseline: favors L LE and has antalgic gait upon rising from sitting 11/18/23: normalized gait mechanics as it relates to the knee, limited trunk rotation secondary to catheter  Goal status: MET   PLAN:  PT FREQUENCY: 2x/week x 4 weeks, 1x/week x 4 weeks (will reduce to 1x/week as soon as able)  PT DURATION: 8 total weeks  PLANNED INTERVENTIONS: 97164- PT Re-evaluation, 97110-Therapeutic exercises, 97530- Therapeutic activity, 97112- Neuromuscular re-education, 97535- Self Care, 02859- Manual therapy, [id number]- Gait training, [id number]- Electrical stimulation (unattended), 97016- Vasopneumatic device, [id number]- Ionotophoresis 4mg /ml Dexamethasone , Patient/Family education, Balance  training, Stair training, Taping, Dry Needling, Joint mobilization, DME instructions, and Cryotherapy   Lucie Meeter, PT, DPT, ATC 11/18/23 10:30 AM

## 2023-11-18 NOTE — Progress Notes (Addendum)
 Referring Physician(s): Curtis Debby PARAS   Chief Complaint: The patient is seen in follow up today s/p left retroperitoneal drain placement 10/29/23.  History of present illness:  Brandon Riley is a 63 y.o. male with a history of BPH with obstruction s/p HoLEP 09/25/23 with [facility] who has presented to the ED multiple times with complaints of abdominal pain and hematuria. CT scan 6/17 was significant for moderate hydronephrosis with obstructing distal ureteral calculi. Repeat scan on 6/25 showed no obstructing stone but was notable for increased perinephric fluid collection measuring 7.1 x 3.8 cm.   IR was consulted and he presented to the [facility] IR department 10/29/23. A 10 Fr drain was placed into a left retroperitoneal fluid collection; output was suspicious for forniceal rupture.   The patient had a telephone consultation with Dr. Francisca (Urology) 11/06/23 and he explained that there is high suspicion for obstruction of the left distal ureter after HoLEP. This has caused hydronephrosis on the left side and a likely forniceal rupture with fluid collection around the left kidney. Dr. Francisca recommended taking the patient to OR for a cystoscopy and attempt to open the left ureteral orifice with resection/possibly stent placement.  This was completed 7/17. Since then, patient has reportedly had 0 mL output from drain. IR consulted to evaluate drain; urology approves and anticipates likely IR removal of the drain.   Today, patient confirms that he has had no output form the drain and is voiding spontaneously since foley removal Monday. He has continued follow up with urology planned. Denies fever, chills, abd pain.    Past Medical History:  Diagnosis Date   Arthritis    BPH (benign prostatic hyperplasia)    Complication of anesthesia    epidural did not work ended up with general and now trouble w/ urinatiing needing laser enucleation of prastate   GERD (gastroesophageal reflux  disease)    Hyperlipemia    Hypertension    OSA on CPAP    Restless leg syndrome     Past Surgical History:  Procedure Laterality Date   CHONDROPLASTY Left 10/28/2018   Procedure: CHONDROPLASTY;  Surgeon: Cristy Bonner DASEN, MD;  Location: [facility];  Service: Orthopedics;  Laterality: Left;   HOLEP-LASER ENUCLEATION OF THE PROSTATE WITH MORCELLATION N/A 09/25/2023   Procedure: ENUCLEATION, PROSTATE, USING LASER, WITH MORCELLATION;  Surgeon: Francisca Redell BROCKS, MD;  Location: [facility];  Service: Urology;  Laterality: N/A;   KNEE ARTHROSCOPY WITH MEDIAL MENISECTOMY Left 10/28/2018   Procedure: LEFT KNEE ARTHROSCOPY WITH MEDIAL MENISECTOMY;  Surgeon: Cristy Bonner DASEN, MD;  Location: [facility];  Service: Orthopedics;  Laterality: Left;   RECONSTRUCTION OF NOSE  2019   TOTAL KNEE ARTHROPLASTY Left 07/20/2023   Procedure: ARTHROPLASTY, KNEE, TOTAL;  Surgeon: Edna Toribio LABOR, MD;  Location: [facility];  Service: Orthopedics;  Laterality: Left;   WISDOM TOOTH EXTRACTION  2012    Allergies: Celebrex [celecoxib], Flexeril [cyclobenzaprine], and Iodinated contrast media  Medications: Prior to Admission medications   Medication Sig Start Date End Date Taking? Authorizing Provider  acetaminophen  (TYLENOL ) 500 MG tablet Take 1,000 mg by mouth every 6 (six) hours as needed for mild pain (pain score 1-3) or moderate pain (pain score 4-6).    [provider]  ALPRAZolam  (XANAX ) 0.5 MG tablet Take 1 tablet (0.5 mg total) by mouth 2 (two) times daily as needed for anxiety. 08/25/23   Curtis Debby PARAS, MD  Ascorbic Acid (VITAMIN C PO) Take 2 tablets by mouth  every other day.    [provider]  docusate sodium  (COLACE) 250 MG capsule Take 250 mg by mouth daily.    [provider]  DULoxetine  (CYMBALTA ) 30 MG capsule Take 1 capsule (30 mg total) by mouth daily. 10/23/23   Curtis Debby PARAS, MD  metoprolol  tartrate (LOPRESSOR ) 25 MG tablet Take 1 tablet  (25 mg total) by mouth 2 (two) times daily. 10/01/23 09/30/24  Drusilla Sabas RAMAN, MD  Nystatin  POWD Apply liberally to affected area 2 times per day 10/19/23   Curtis Debby PARAS, MD  rOPINIRole  (REQUIP ) 1 MG tablet Take 3 tablets (3 mg total) by mouth at bedtime. 10/27/23   Curtis Debby PARAS, MD  rosuvastatin  (CRESTOR ) 10 MG tablet TAKE ONE TABLET BY MOUTH DAILY Patient taking differently: Take 10 mg by mouth every Monday, Wednesday, and Friday. 09/21/22   Curtis Debby PARAS, MD     Family History  Problem Relation Age of Onset   Dementia Mother    Heart Problems Mother        Double bypass   Diabetes Mother    Other Father        Prostate Issues    Social History   Socioeconomic History   Marital status: Married    Spouse name: Not on file   Number of children: Not on file   Years of education: Not on file   Highest education level: Associate degree: occupational, Scientist, product/process development, or vocational program  Occupational History   Not on file  Tobacco Use   Smoking status: Never    Passive exposure: Never   Smokeless tobacco: Never  Vaping Use   Vaping status: Never Used  Substance and Sexual Activity   Alcohol  use: Not Currently    Comment: 2-3x per week   Drug use: Never   Sexual activity: Yes    Partners: Female    Comment: MARRIED  Other Topics Concern   Not on file  Social History Narrative   Are you right handed or left handed?  Right Handed   Are you currently employed ?Retired   What is your current occupation?   Do you live at home alone? No   Who lives with you? Wife and animals   What type of home do you live in: 1 story or 2 story? One story with a basement.       Social Drivers of Corporate investment banker Strain: Low Risk  (05/11/2023)   Overall Financial Resource Strain (CARDIA)    Difficulty of Paying Living Expenses: Not hard at all  Food Insecurity: No Food Insecurity (10/14/2023)   Hunger Vital Sign    Worried About Running Out of Food in the Last  Year: Never true    Ran Out of Food in the Last Year: Never true  Transportation Needs: No Transportation Needs (10/14/2023)   PRAPARE - Administrator, Civil Service (Medical): No    Lack of Transportation (Non-Medical): No  Physical Activity: Sufficiently Active (05/11/2023)   Exercise Vital Sign    Days of Exercise per Week: 5 days    Minutes of Exercise per Session: 130 min  Stress: No Stress Concern Present (05/11/2023)   [employer] of Occupational Health - Occupational Stress Questionnaire    Feeling of Stress : Only a little  Social Connections: Moderately Isolated (05/11/2023)   Social Connection and Isolation Panel    Frequency of Communication with Friends and Family: More than three times a week    Frequency  of Social Gatherings with Friends and Family: More than three times a week    Attends Religious Services: Never    Database administrator or Organizations: No    Attends Engineer, structural: Not on file    Marital Status: Married     Vital Signs: There were no vitals taken for this visit.  Physical Exam Constitutional:      General: He is not in acute distress.    Appearance: He is not ill-appearing.  Pulmonary:     Effort: Pulmonary effort is normal.  Abdominal:     Tenderness: There is no abdominal tenderness.     Comments: Left back/flank drain. Suture in place. Skin insertion site is clean/dry. Surrounding skin with mild erythema.  Skin:    General: Skin is warm and dry.  Neurological:     Mental Status: He is alert and oriented to person, place, and time.     Imaging: No results found.  Labs:  CBC: Recent Labs    10/01/23 0525 10/08/23 0952 10/14/23 0406 10/29/23 1027  WBC 10.7* 11.0* 6.8 6.3  HGB 12.3* 12.0* 11.4* 11.4*  HCT 36.4* 38.4 35.9* 34.5*  PLT 200 415 338 287    COAGS: Recent Labs    10/29/23 1027  INR 1.2    BMP: Recent Labs    09/29/23 0521 09/30/23 0527 10/01/23 0525 10/08/23 0952  10/14/23 0406 11/03/23 1204  NA 132* 130* 129* 134 135 141  K 3.6 3.5 3.7 5.0 4.1 4.5  CL 102 97* 100 98 102 102  CO2 18* 20* 20* 19* 21* 21  GLUCOSE 134* 123* 116* 94 101* 71  BUN 19 19 19 19 19 19   CALCIUM  8.6* 8.5* 8.4* 9.0 8.7* 9.7  CREATININE 1.48* 1.51* 1.36* 1.56* 1.36* 1.15  GFRNONAA 53* 52* 59*  --  59*  --     LIVER FUNCTION TESTS: Recent Labs    09/27/23 1043 09/28/23 0443 10/01/23 0525 10/08/23 0952  BILITOT 2.0* 2.1* 1.2 0.4  AST 22 20 15 21   ALT 20 21 18 27   ALKPHOS 60 56 62 89  PROT 7.1 6.4* 6.5 7.0  ALBUMIN 3.6 3.0* 2.6* 3.6*    Assessment:  7/14 CT imaging showed resolution of the fluid collection and stable positioning of the drain. Stent placed by urology 7/17 with no output from drain since then.   IR agrees with plan to remove drain today. No additional imaging indicated. External suture removed, and drain removed in its entirety without complication.   Patient to continue with scheduled follow up with urology. No additional IR f/u anticipated.   Signed: Laymon Coast, NP 11/18/2023, 12:50 PM   Please refer to Dr. Eliazar attestation of this note for management and plan.

## 2023-11-19 ENCOUNTER — Other Ambulatory Visit: Payer: Self-pay

## 2023-11-20 NOTE — Patient Instructions (Signed)
 Visit Information  Thank you for taking time to visit with me today. Please don't hesitate to contact me if I can be of assistance to you before our next scheduled telephone appointment.  Our next appointment is by telephone on 12/10/2023 at 11:30am  Following is a copy of your care plan:   Goals Addressed             This Visit's Progress    VBCI RN Care Plan       Problems:  Chronic Disease Management support and education needs related to BPH (s/p prostatic enucleation), Hydronephrosis of Left Kidney, Multiple Post Op Complications including episodes of urinary obstruction, and Persistent complaints of Pain (left-sided abdominal & flank pain).  Goal: Over the next 5 months the Patient will continue to work with RN Care Manager and/or Social Worker to address care management and care coordination needs related to Pain associated with Hydronephrosis of Left Kidney & Multiple Post Op Complications as well as Depression d/t physical illness  as evidenced by adherence to care management team scheduled appointments      Interventions:   Pain Interventions: Pain assessment performed Medications reviewed Reviewed provider established plan for pain management Discussed importance of adherence to all scheduled medical appointments Counseled on the importance of reporting any/all new or changed pain symptoms or management strategies to pain management provider Advised patient to report to care team affect of pain on daily activities Discussed use of relaxation techniques and/or diversional activities to assist with pain reduction (distraction, imagery, relaxation, massage, acupressure, TENS, heat, and cold application Reviewed with patient prescribed pharmacological and nonpharmacological pain relief strategies Screening for signs and symptoms of depression related to chronic disease state  Assessed social determinant of health barriers  Patient Self-Care Activities:  Attend all scheduled  provider appointments Call provider office for new concerns or questions  Perform all self care activities independently  Perform IADL's (shopping, preparing meals, housekeeping, managing finances) independently Take medications as prescribed   Work with the social worker to address care coordination needs and will continue to work with the clinical team to address health care and disease management related needs  Plan:  The patient has been provided with contact information for the care management team and has been advised to call with any health related questions or concerns.              Patient verbalizes understanding of instructions and care plan provided today and agrees to view in MyChart. Active MyChart status and patient understanding of how to access instructions and care plan via MyChart confirmed with patient.     The patient has been provided with contact information for the care management team and has been advised to call with any health related questions or concerns.   Please call the care guide team at [phone] if you need to cancel or reschedule your appointment.   Please call 1-800-273-TALK (toll free, 24 hour hotline) if you are experiencing a Mental Health or Behavioral Health Crisis or need someone to talk to.  Ranard Harte A. Gordy RN, BA, [facility], CRRN [place]  [facility] Population Health RN Care Manager Direct Dial: [phone]  Fax: [phone]

## 2023-11-20 NOTE — Patient Outreach (Signed)
 Complex Care Management   Visit Note  11/19/2023  Name:  Brandon Riley MRN: [id number] DOB: 23-Jun-1960  Situation: Referral received for Complex Care Management related to Hydronephrosis of Left Kidney, stent placed due for removal 12/03/2023 I obtained verbal consent from Patient.  Visit completed with patient  on the phone  Background:   Past Medical History:  Diagnosis Date   Allergy Seasonal   Grass & pollens   Arthritis Knees & areas   BPH (benign prostatic hyperplasia)    Complication of anesthesia    epidural did not work ended up with general and now trouble w/ urinatiing needing laser enucleation of prastate   GERD (gastroesophageal reflux disease)    Hydronephrosis of left kidney    Hyperlipemia    Hypertension    OSA on CPAP    Restless leg syndrome    Sleep apnea    Severe sleep apnea    Assessment: Patient Reported Symptoms:  Cognitive Cognitive Status: Insightful and able to interpret abstract concepts, Alert and oriented to person, place, and time, Normal speech and language skills Cognitive/Intellectual Conditions Management [RPT]: None reported or documented in medical history or problem list   Health Maintenance Behaviors: Annual physical exam Health Facilitated by: Stress management, Pain control  Neurological Neurological Review of Symptoms: No symptoms reported    HEENT HEENT Symptoms Reported: No symptoms reported      Cardiovascular Cardiovascular Symptoms Reported: No symptoms reported    Respiratory Respiratory Symptoms Reported: No symptoms reported    Endocrine Endocrine Symptoms Reported: No symptoms reported Is patient diabetic?: No    Gastrointestinal Gastrointestinal Symptoms Reported: No symptoms reported      Genitourinary Genitourinary Symptoms Reported: Other Other Genitourinary Symptoms: Feels much better, Urinary stent removal scheduled for 12/03/23 if no further issues Genitourinary Management Strategies: Medication therapy,  Catheter, indwelling (stent removal scheduled for 12/03/23)  Integumentary Integumentary Symptoms Reported: No symptoms reported    Musculoskeletal Musculoskelatal Symptoms Reviewed: Limited mobility Musculoskeletal Management Strategies: Medical device Falls in the past year?: No    Psychosocial Psychosocial Symptoms Reported: No symptoms reported Behavioral Health Self-Management Outcome: 4 (good) Major Change/Loss/Stressor/Fears (CP): Medical condition, self Techniques to Cope with Loss/Stress/Change: Counseling, Medication Quality of Family Relationships: involved, helpful, supportive Do you feel physically threatened by others?: No      11/02/2023   11:10 AM  Depression screen PHQ 2/9  Decreased Interest 1  Down, Depressed, Hopeless 2  PHQ - 2 Score 3  Altered sleeping 2  Tired, decreased energy 2  Change in appetite 1  Feeling bad or failure about yourself  1  Trouble concentrating 1  Moving slowly or fidgety/restless 0  Suicidal thoughts 0  PHQ-9 Score 10  Difficult doing work/chores Somewhat difficult    There were no vitals filed for this visit.  Medications Reviewed Today     Reviewed by Gordy Channing LABOR, RN (Registered Nurse) on 11/19/23 at 1511  Med List Status: <None>   Medication Order Taking? Sig Documenting Provider Last Dose Status Informant  acetaminophen  (TYLENOL ) 500 MG tablet [id number]  Take 1,000 mg by mouth every 6 (six) hours as needed for mild pain (pain score 1-3) or moderate pain (pain score 4-6).  Patient not taking: Reported on 11/19/2023   [provider]  Active Self  ALPRAZolam  (XANAX ) 0.5 MG tablet [id number] Yes Take 1 tablet (0.5 mg total) by mouth 2 (two) times daily as needed for anxiety. Curtis Debby PARAS, MD  Active Self  Med Note (CRUTHIS, CHLOE C   Wed Oct 14, 2023  7:10 AM)    Ascorbic Acid (VITAMIN C PO) [id number] Yes Take 1 tablet by mouth every other day. [provider]  Active Self  diazepam   (VALIUM ) 10 MG tablet [id number] Yes Take 1 tablet (10 mg total) by mouth once as needed for up to 1 dose (take 1 hour prior to stent removal). Francisca Redell BROCKS, MD  Active   docusate sodium  (COLACE) 50 MG capsule [id number] Yes Take 50 mg by mouth 2 (two) times daily. [provider]  Active Self  fluconazole  (DIFLUCAN ) 100 MG tablet [id number] Yes Take 2 tablets (200 mg total) by mouth daily. X 7 days Stoneking, Adine PARAS., MD  Active Self           Med Note JERALYN DUNCANS A   Mon Nov 09, 2023  3:20 PM) Hasn't started  MAGNESIUM  PO [id number] Yes Take 1 tablet by mouth every other day. [provider]  Active Self  metoprolol  tartrate (LOPRESSOR ) 25 MG tablet [id number] Yes Take 1 tablet (25 mg total) by mouth 2 (two) times daily. Drusilla Sabas RAMAN, MD  Active Self  Nystatin  POWD [id number] Yes Apply liberally to affected area 2 times per day Curtis Debby PARAS, MD  Active Self  rOPINIRole  (REQUIP ) 1 MG tablet [id number] Yes Take 3 tablets (3 mg total) by mouth at bedtime. Curtis Debby PARAS, MD  Active Self  rosuvastatin  (CRESTOR ) 10 MG tablet [id number] Yes TAKE ONE TABLET BY MOUTH DAILY Thekkekandam, Thomas J, MD  Active Self  VITAMIN D PO [id number] Yes Take 1 capsule by mouth every other day. [provider]  Active Self            Recommendation:   Specialty provider follow-up 12/03/2023 for stent removal Continue Current Plan of Care  Follow Up Plan:   Telephone follow up appointment date/time:  12/10/2023 @ 11:30 am  Neveen Daponte A. Gordy RN, BA, [facility], CRRN [place]  [facility] Population Health RN Care Manager Direct Dial: [phone]  Fax: [phone]

## 2023-11-21 ENCOUNTER — Other Ambulatory Visit: Payer: Self-pay | Admitting: Sports Medicine

## 2023-11-21 ENCOUNTER — Other Ambulatory Visit: Payer: Self-pay

## 2023-11-21 ENCOUNTER — Ambulatory Visit
Admission: EM | Admit: 2023-11-21 | Discharge: 2023-11-21 | Disposition: A | Discharge status: Home / Self Care | Attending: Family Medicine | Admitting: Family Medicine

## 2023-11-21 DIAGNOSIS — R21 Rash and other nonspecific skin eruption: Secondary | ICD-10-CM

## 2023-11-21 DIAGNOSIS — E7800 Pure hypercholesterolemia, unspecified: Secondary | ICD-10-CM

## 2023-11-21 MED ORDER — PREDNISONE 10 MG (21) PO TBPK: ORAL_TABLET | Freq: Every day | ORAL | 0 refills | Status: DC

## 2023-11-21 MED ORDER — METHYLPREDNISOLONE ACETATE 80 MG/ML IJ SUSP
80.0000 mg | Freq: Once | INTRAMUSCULAR | Status: AC
  Administered 2023-11-21: 80 mg via INTRAMUSCULAR

## 2023-11-21 NOTE — Discharge Instructions (Addendum)
 Advised patient to take medications as directed with food to completion.  Encouraged increase daily water  intake 64 ounces per day while taking this medication.  Advised if symptoms worsen and/or unresolved please follow with your PCP or here for further evaluation.

## 2023-11-21 NOTE — ED Provider Notes (Signed)
 Brandon Riley    CSN: [id number] Arrival date & time: 11/21/23  1224      History   Chief Complaint No chief complaint on file.   HPI Brandon Riley is a 63 y.o. male.   HPI Brandon Riley 63 year old male presents with a rash of bilateral hands since 7/22.  Patient believes this is an allergic reaction from kidney stent he had placed on 7/17.  Patient is not taking any new medications.  PMH significant for obesity, HTN, severe OSA, malignant neoplasm of spine.  Past Medical History:  Diagnosis Date   Allergy Seasonal   Grass & pollens   Arthritis Knees & areas   BPH (benign prostatic hyperplasia)    Complication of anesthesia    epidural did not work ended up with general and now trouble w/ urinatiing needing laser enucleation of prastate   GERD (gastroesophageal reflux disease)    Hydronephrosis of left kidney    Hyperlipemia    Hypertension    OSA on CPAP    Restless leg syndrome    Sleep apnea    Severe sleep apnea    Patient Active Problem List   Diagnosis Date Noted   Perinephric fluid collection 11/03/2023   Abdominal fluid collection 10/23/2023   Intertrigo 10/19/2023   Hydronephrosis 10/14/2023   AKI (acute kidney injury) (HCC) 09/27/2023   Hypersomnia 09/27/2023   Ileus, postoperative (HCC) 09/27/2023   Hydronephrosis of left kidney 09/27/2023   Malignant neoplasm of spine (HCC) 08/31/2023   Sacral lesion 08/04/2023   Urinary retention 08/03/2023   History of total knee arthroplasty, left 07/20/2023   Preoperative clearance 05/12/2023   COVID-19 03/30/2023   Restless leg syndrome 10/08/2022   Dermatitis 09/01/2022   Prediabetes 07/09/2021   BPH (benign prostatic hyperplasia) 06/12/2021   Elevated PSA 09/07/2020   Polyarthralgia 08/31/2020   Steatohepatitis 05/16/2020   Open fracture of left thumb 01/31/2020   GERD (gastroesophageal reflux disease) 11/07/2016   Primary osteoarthritis of right wrist 09/29/2016   Depression due to physical  illness 12/17/2015   Left insertional Achilles tendinosis 11/05/2015   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   Obstructive sleep apnea on BiPAP with periodic limb movement disorder 06/05/2014   Obesity 05/02/2013   Annual physical exam 04/04/2013   Primary osteoarthritis of both knees with pseudogout 04/04/2013   Degenerative disc disease, cervical 04/04/2013   Hyperlipidemia 04/04/2013   Benign essential hypertension 04/04/2013   Lumbar degenerative disc disease 04/04/2013    Past Surgical History:  Procedure Laterality Date   CHONDROPLASTY Left 10/28/2018   Procedure: CHONDROPLASTY;  Surgeon: Cristy Bonner DASEN, MD;  Location: [facility];  Service: Orthopedics;  Laterality: Left;   CYSTOSCOPY WITH STENT PLACEMENT Left 11/12/2023   Procedure: CYSTOSCOPY, WITH STENT INSERTION;  Surgeon: Francisca Redell BROCKS, MD;  Location: [facility];  Service: Urology;  Laterality: Left;   HOLEP-LASER ENUCLEATION OF THE PROSTATE WITH MORCELLATION N/A 09/25/2023   Procedure: ENUCLEATION, PROSTATE, USING LASER, WITH MORCELLATION;  Surgeon: Francisca Redell BROCKS, MD;  Location: [facility];  Service: Urology;  Laterality: N/A;   IR RADIOLOGIST EVAL & MGMT  11/09/2023   IR RADIOLOGIST EVAL & MGMT  11/18/2023   JOINT REPLACEMENT  07/20/23   Left knee   KNEE ARTHROSCOPY WITH MEDIAL MENISECTOMY Left 10/28/2018   Procedure: LEFT KNEE ARTHROSCOPY WITH MEDIAL MENISECTOMY;  Surgeon: Cristy Bonner DASEN, MD;  Location: [facility];  Service: Orthopedics;  Laterality: Left;   RECONSTRUCTION OF NOSE  2019   TOTAL KNEE  ARTHROPLASTY Left 07/20/2023   Procedure: ARTHROPLASTY, KNEE, TOTAL;  Surgeon: Edna Toribio LABOR, MD;  Location: [facility];  Service: Orthopedics;  Laterality: Left;   WISDOM TOOTH EXTRACTION  2012       Home Medications    Prior to Admission medications   Medication Sig Start Date End Date Taking? Authorizing Provider  predniSONE  (STERAPRED UNI-PAK 21 TAB) 10 MG (21) TBPK tablet Take  by mouth daily. Take 6 tabs by mouth daily  for 2 days, then 5 tabs for 2 days, then 4 tabs for 2 days, then 3 tabs for 2 days, 2 tabs for 2 days, then 1 tab by mouth daily for 2 days 11/21/23  Yes Teddy Sharper, FNP  acetaminophen  (TYLENOL ) 500 MG tablet Take 1,000 mg by mouth every 6 (six) hours as needed for mild pain (pain score 1-3) or moderate pain (pain score 4-6). Patient not taking: Reported on 11/19/2023    [provider]  ALPRAZolam  (XANAX ) 0.5 MG tablet Take 1 tablet (0.5 mg total) by mouth 2 (two) times daily as needed for anxiety. 08/25/23   Curtis Debby PARAS, MD  Ascorbic Acid (VITAMIN C PO) Take 1 tablet by mouth every other day.    [provider]  diazepam  (VALIUM ) 10 MG tablet Take 1 tablet (10 mg total) by mouth once as needed for up to 1 dose (take 1 hour prior to stent removal). 11/13/23   Francisca Redell BROCKS, MD  docusate sodium  (COLACE) 50 MG capsule Take 50 mg by mouth 2 (two) times daily.    [provider]  fluconazole  (DIFLUCAN ) 100 MG tablet Take 2 tablets (200 mg total) by mouth daily. X 7 days 11/09/23   Roseann Adine PARAS., MD  MAGNESIUM  PO Take 1 tablet by mouth every other day.    [provider]  metoprolol  tartrate (LOPRESSOR ) 25 MG tablet Take 1 tablet (25 mg total) by mouth 2 (two) times daily. 10/01/23 09/30/24  Drusilla Sabas RAMAN, MD  Nystatin  POWD Apply liberally to affected area 2 times per day 10/19/23   Curtis Debby PARAS, MD  rOPINIRole  (REQUIP ) 1 MG tablet Take 3 tablets (3 mg total) by mouth at bedtime. 10/27/23   Curtis Debby PARAS, MD  rosuvastatin  (CRESTOR ) 10 MG tablet TAKE ONE TABLET BY MOUTH DAILY 09/21/22   Curtis Debby PARAS, MD  VITAMIN D PO Take 1 capsule by mouth every other day.    [provider]    Family History Family History  Problem Relation Age of Onset   Dementia Mother    Heart Problems Mother        Double bypass   Diabetes Mother    Arthritis Mother    Other Father         Prostate Issues   Arthritis Father     Social History Social History   Tobacco Use   Smoking status: Never    Passive exposure: Never   Smokeless tobacco: Never  Vaping Use   Vaping status: Never Used  Substance Use Topics   Alcohol  use: Not Currently    Comment: 2-3x per week   Drug use: Never     Allergies   Celebrex [celecoxib], Flexeril [cyclobenzaprine], and Iodinated contrast media   Review of Systems Review of Systems  Skin:  Positive for rash.  All other systems reviewed and are negative.    Physical Exam Triage Vital Signs ED Triage Vitals  Encounter Vitals Group     BP      Girls Systolic  BP Percentile      Girls Diastolic BP Percentile      Boys Systolic BP Percentile      Boys Diastolic BP Percentile      Pulse      Resp      Temp      Temp src      SpO2      Weight      Height      Head Circumference      Peak Flow      Pain Score      Pain Loc      Pain Education      Exclude from Growth Chart    No data found.  Updated Vital Signs BP (!) 146/95   Pulse 90   Temp 97.7 F (36.5 C)   Resp 19   SpO2 98%    Physical Exam Vitals and nursing note reviewed.  Constitutional:      Appearance: Normal appearance. He is normal weight.  HENT:     Head: Normocephalic and atraumatic.     Mouth/Throat:     Mouth: Mucous membranes are moist.     Pharynx: Oropharynx is clear.  Eyes:     Extraocular Movements: Extraocular movements intact.     Conjunctiva/sclera: Conjunctivae normal.     Pupils: Pupils are equal, round, and reactive to light.  Cardiovascular:     Rate and Rhythm: Normal rate and regular rhythm.     Pulses: Normal pulses.     Heart sounds: Normal heart sounds.  Pulmonary:     Effort: Pulmonary effort is normal.     Breath sounds: Normal breath sounds. No wheezing, rhonchi or rales.  Musculoskeletal:        General: Normal range of motion.  Skin:    General: Skin is warm and dry.     Comments: Bilateral hands (dorsum):  Pruritic, erythematous, vesicular eruption noted please see image below  Neurological:     General: No focal deficit present.     Mental Status: He is alert and oriented to person, place, and time. Mental status is at baseline.  Psychiatric:        Mood and Affect: Mood normal.        Behavior: Behavior normal.      UC Treatments / Results  Labs (all labs ordered are listed, but only abnormal results are displayed) Labs Reviewed - No data to display  EKG   Radiology No results found.  Procedures Procedures (including critical Riley time)  Medications Ordered in UC Medications  methylPREDNISolone  acetate (DEPO-MEDROL ) injection 80 mg (80 mg Intramuscular Given 11/21/23 1318)    Initial Impression / Assessment and Plan / UC Course  I have reviewed the triage vital signs and the nursing notes.  Pertinent labs & imaging results that were available during my Riley of the patient were reviewed by me and considered in my medical decision making (see chart for details).     MDM: 1.  Rash of both hands-IM Depo-Medrol  80 mg given once in clinic and prior to discharge.  Rash and nonspecific skin eruption-Rx'd Sterapred Unipak 42 tab 10 mg taper. Advised patient to take medications as directed with food to completion.  Encouraged increase daily water  intake 64 ounces per day while taking this medication.  Advised if symptoms worsen and/or unresolved please follow with your PCP or here for further evaluation.  Patient discharged home, hemodynamically stable. Final Clinical Impressions(s) / UC Diagnoses   Final diagnoses:  Rash of both hands  Rash and nonspecific skin eruption     Discharge Instructions      Advised patient to take medications as directed with food to completion.  Encouraged increase daily water  intake 64 ounces per day while taking this medication.  Advised if symptoms worsen and/or unresolved please follow with your PCP or here for further evaluation.     ED  Prescriptions     Medication Sig Dispense Auth. Provider   predniSONE  (STERAPRED UNI-PAK 21 TAB) 10 MG (21) TBPK tablet Take by mouth daily. Take 6 tabs by mouth daily  for 2 days, then 5 tabs for 2 days, then 4 tabs for 2 days, then 3 tabs for 2 days, 2 tabs for 2 days, then 1 tab by mouth daily for 2 days 42 tablet Teddy Sharper, FNP      PDMP not reviewed this encounter.   Teddy Sharper, FNP 11/21/23 1325

## 2023-11-21 NOTE — ED Triage Notes (Signed)
 Pt presents to uc with rash on bilateral hands since 7/22 pt reports he thinks this is an allergic reaction from the kidney stent he had placed on 7/17. Pt is not taking any new medications.

## 2023-11-25 ENCOUNTER — Other Ambulatory Visit

## 2023-11-25 ENCOUNTER — Encounter: Payer: Self-pay | Admitting: Urology

## 2023-11-26 ENCOUNTER — Ambulatory Visit (INDEPENDENT_AMBULATORY_CARE_PROVIDER_SITE_OTHER): Admitting: Sports Medicine

## 2023-11-26 VITALS — BP 123/75 | HR 68 | Ht 67.0 in | Wt 222.0 lb

## 2023-11-26 DIAGNOSIS — R21 Rash and other nonspecific skin eruption: Secondary | ICD-10-CM

## 2023-11-26 NOTE — Assessment & Plan Note (Signed)
 After a stent placement Brandon Riley developed a rash on his hand, unclear etiology, no mucosal involvement, He went to urgent care, got some Depo-Medrol  and oral steroids and is improving.

## 2023-11-26 NOTE — Progress Notes (Signed)
    Procedures performed today:    None.  Independent interpretation of notes and tests performed by another provider:   None.  Brief History, Exam, Impression, and Recommendations:    Skin rash After a stent placement Brandon Riley developed a rash on his hand, unclear etiology, no mucosal involvement, He went to urgent care, got some Depo-Medrol  and oral steroids and is improving.  We signed off on some DMV forms today and he plans to revisit the PET scan and potential biopsy in a couple of months.  ____________________________________________ Debby PARAS. Curtis, M.D., ABFM., CAQSM., AME. Primary Care and Sports Medicine  [facility]  Adjunct Professor of [employer] [facility] Medicine  [employer]  Restaurant manager, fast food

## 2023-12-03 ENCOUNTER — Ambulatory Visit (INDEPENDENT_AMBULATORY_CARE_PROVIDER_SITE_OTHER): Admitting: Urology

## 2023-12-03 ENCOUNTER — Encounter: Payer: Self-pay | Admitting: Urology

## 2023-12-03 VITALS — BP 113/72 | HR 56 | Ht 67.0 in | Wt 215.0 lb

## 2023-12-03 DIAGNOSIS — N1330 Unspecified hydronephrosis: Secondary | ICD-10-CM | POA: Diagnosis not present

## 2023-12-03 MED ORDER — SULFAMETHOXAZOLE-TRIMETHOPRIM 800-160 MG PO TABS
1.0000 | ORAL_TABLET | ORAL | Status: DC
  Administered 2023-12-03: 1 via ORAL

## 2023-12-03 NOTE — Progress Notes (Signed)
 Cystoscopy Procedure Note:  Indication:  Patient with complicated urologic history including HOLEP on 09/25/2023, multiple hospitalizations at outside hospital afterwards for gross hematuria/clot retention, abdominal pain, CT showing new left hydronephrosis down to the bladder, as well as a new left-sided retroperitoneal fluid collection.  He ultimately underwent IR drain placement in the retroperitoneal fluid collection.  This was felt to be from obstruction of the left ureteral orifice with forniceal rupture, drain fluid was consistent with urine.   On 11/12/2023 he underwent cystoscopy and resection of the left ureteral orifice which was opened, and a stent was placed.  Retrograde pyelogram at that time did not show connection between the kidney and the drain.  Foley was removed 11/16/2023, he had no increase in his flank drain output and this was removed a few days later.  Bactrim  given for prophylaxis  After informed consent and discussion of the procedure and its risks, LITTLE WINTON was positioned and prepped in the standard fashion. Cystoscopy was performed with a flexible cystoscope. The stent was grasped with flexible graspers and removed in its entirety. The patient tolerated the procedure well.  Findings: Uncomplicated stent removal  Assessment and Plan: RTC 8 weeks renal ultrasound prior  Redell JAYSON Burnet, MD 12/03/2023

## 2023-12-10 ENCOUNTER — Other Ambulatory Visit: Payer: Self-pay

## 2023-12-14 ENCOUNTER — Ambulatory Visit
Admission: EM | Admit: 2023-12-14 | Discharge: 2023-12-14 | Disposition: A | Discharge status: Home / Self Care | Attending: Family Medicine | Admitting: Family Medicine

## 2023-12-14 ENCOUNTER — Encounter: Payer: Self-pay | Admitting: Emergency Medicine

## 2023-12-14 DIAGNOSIS — J0100 Acute maxillary sinusitis, unspecified: Secondary | ICD-10-CM | POA: Diagnosis not present

## 2023-12-14 DIAGNOSIS — R0981 Nasal congestion: Secondary | ICD-10-CM | POA: Diagnosis not present

## 2023-12-14 MED ORDER — AMOXICILLIN-POT CLAVULANATE 875-125 MG PO TABS: 1.0000 | ORAL_TABLET | Freq: Two times a day (BID) | ORAL | 0 refills | Status: DC

## 2023-12-14 MED ORDER — PREDNISONE 20 MG PO TABS: ORAL_TABLET | ORAL | 0 refills | Status: DC

## 2023-12-14 NOTE — Discharge Instructions (Addendum)
 Advised patient to take medication as directed with food to completion.  Advised patient take prednisone  with first dose of Augmentin  for the next 5 of 7 days.  Encouraged to increase daily water intake to 64 ounces per day while taking these medications.  Advised if symptoms worsen and/or unresolved please follow-up with your PCP or here for further evaluation.

## 2023-12-14 NOTE — ED Provider Notes (Signed)
 [facility]    CSN: [id number] Arrival date & time: 12/14/23  1125      History   Chief Complaint Chief Complaint  Patient presents with   Sinus Pressure   Headache   Cough   Fatigue    HPI STEADMAN PROSPERI is a 63 y.o. male.   HPI 63 year old male presents with headache, sinus pressure, intermittent cough and sore throat for 3 days.  PMH significant for BPH, obesity, and sleep apnea.  Past Medical History:  Diagnosis Date   Allergy Seasonal   Grass & pollens   Arthritis Knees & areas   BPH (benign prostatic hyperplasia)    Complication of anesthesia    epidural did not work ended up with general and now trouble w/ urinatiing needing laser enucleation of prastate   GERD (gastroesophageal reflux disease)    Hydronephrosis of left kidney    Hyperlipemia    Hypertension    OSA on CPAP    Restless leg syndrome    Sleep apnea    Severe sleep apnea    Patient Active Problem List   Diagnosis Date Noted   Skin rash 11/26/2023   Perinephric fluid collection 11/03/2023   Abdominal fluid collection 10/23/2023   Intertrigo 10/19/2023   Hydronephrosis 10/14/2023   AKI (acute kidney injury) (HCC) 09/27/2023   Hypersomnia 09/27/2023   Ileus, postoperative (HCC) 09/27/2023   Hydronephrosis of left kidney 09/27/2023   Malignant neoplasm of spine (HCC) 08/31/2023   Sacral lesion 08/04/2023   Urinary retention 08/03/2023   History of total knee arthroplasty, left 07/20/2023   Preoperative clearance 05/12/2023   COVID-19 03/30/2023   Restless leg syndrome 10/08/2022   Dermatitis 09/01/2022   Prediabetes 07/09/2021   BPH (benign prostatic hyperplasia) 06/12/2021   Elevated PSA 09/07/2020   Polyarthralgia 08/31/2020   Steatohepatitis 05/16/2020   Open fracture of left thumb 01/31/2020   GERD (gastroesophageal reflux disease) 11/07/2016   Primary osteoarthritis of right wrist 09/29/2016   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   Left insertional Achilles  tendinosis 11/05/2015   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   Obstructive sleep apnea on BiPAP with periodic limb movement disorder 06/05/2014   Obesity 05/02/2013   Annual physical exam 04/04/2013   Primary osteoarthritis of both knees with pseudogout 04/04/2013   Degenerative disc disease, cervical 04/04/2013   Hyperlipidemia 04/04/2013   Benign essential hypertension 04/04/2013   Lumbar degenerative disc disease 04/04/2013    Past Surgical History:  Procedure Laterality Date   CHONDROPLASTY Left 10/28/2018   Procedure: CHONDROPLASTY;  Surgeon: Cristy Bonner DASEN, MD;  Location: [facility];  Service: Orthopedics;  Laterality: Left;   CYSTOSCOPY WITH STENT PLACEMENT Left 11/12/2023   Procedure: CYSTOSCOPY, WITH STENT INSERTION;  Surgeon: Francisca Redell BROCKS, MD;  Location: [facility];  Service: Urology;  Laterality: Left;   HOLEP-LASER ENUCLEATION OF THE PROSTATE WITH MORCELLATION N/A 09/25/2023   Procedure: ENUCLEATION, PROSTATE, USING LASER, WITH MORCELLATION;  Surgeon: Francisca Redell BROCKS, MD;  Location: [facility];  Service: Urology;  Laterality: N/A;   IR RADIOLOGIST EVAL & MGMT  11/09/2023   IR RADIOLOGIST EVAL & MGMT  11/18/2023   JOINT REPLACEMENT  07/20/23   Left knee   KNEE ARTHROSCOPY WITH MEDIAL MENISECTOMY Left 10/28/2018   Procedure: LEFT KNEE ARTHROSCOPY WITH MEDIAL MENISECTOMY;  Surgeon: Cristy Bonner DASEN, MD;  Location: [facility];  Service: Orthopedics;  Laterality: Left;   RECONSTRUCTION OF NOSE  2019   TOTAL KNEE ARTHROPLASTY Left 07/20/2023  Procedure: ARTHROPLASTY, KNEE, TOTAL;  Surgeon: Edna Toribio LABOR, MD;  Location: [facility];  Service: Orthopedics;  Laterality: Left;   WISDOM TOOTH EXTRACTION  2012       Home Medications    Prior to Admission medications   Medication Sig Start Date End Date Taking? Authorizing Provider  amoxicillin -clavulanate (AUGMENTIN ) 875-125 MG tablet Take 1 tablet by mouth every 12 (twelve) hours. 12/14/23  Yes  Teddy Sharper, FNP  predniSONE  (DELTASONE ) 20 MG tablet Take 3 tabs PO daily x 5 days. 12/14/23  Yes Kimmie Berggren, FNP  ALPRAZolam  (XANAX ) 0.5 MG tablet Take 1 tablet (0.5 mg total) by mouth 2 (two) times daily as needed for anxiety. 08/25/23   Curtis Debby PARAS, MD  diazepam  (VALIUM ) 10 MG tablet Take 1 tablet (10 mg total) by mouth once as needed for up to 1 dose (take 1 hour prior to stent removal). 11/13/23   Francisca Redell BROCKS, MD  docusate sodium  (COLACE) 50 MG capsule Take 50 mg by mouth 2 (two) times daily.    [provider]  MAGNESIUM  PO Take 1 tablet by mouth every other day.    [provider]  metoprolol  tartrate (LOPRESSOR ) 25 MG tablet Take 1 tablet (25 mg total) by mouth 2 (two) times daily. 10/01/23 09/30/24  Drusilla Sabas RAMAN, MD  rOPINIRole  (REQUIP ) 1 MG tablet Take 3 tablets (3 mg total) by mouth at bedtime. 10/27/23   Curtis Debby PARAS, MD  rosuvastatin  (CRESTOR ) 10 MG tablet TAKE 1 TABLET BY MOUTH DAILY 11/23/23   Curtis Debby PARAS, MD  VITAMIN D PO Take 1 capsule by mouth every other day.    [provider]    Family History Family History  Problem Relation Age of Onset   Dementia Mother    Heart Problems Mother        Double bypass   Diabetes Mother    Arthritis Mother    Other Father        Prostate Issues   Arthritis Father     Social History Social History   Tobacco Use   Smoking status: Never    Passive exposure: Never   Smokeless tobacco: Never  Vaping Use   Vaping status: Never Used  Substance Use Topics   Alcohol  use: Not Currently    Comment: 2-3x per week   Drug use: Never     Allergies   Celebrex [celecoxib], Flexeril [cyclobenzaprine], and Iodinated contrast media   Review of Systems Review of Systems  HENT:  Positive for congestion, postnasal drip, sinus pressure and sinus pain.   All other systems reviewed and are negative.    Physical Exam Triage Vital Signs ED Triage Vitals  Encounter Vitals  Group     BP 12/14/23 1151 (!) 132/90     Girls Systolic BP Percentile --      Girls Diastolic BP Percentile --      Boys Systolic BP Percentile --      Boys Diastolic BP Percentile --      Pulse Rate 12/14/23 1151 91     Resp 12/14/23 1151 18     Temp 12/14/23 1151 98.4 F (36.9 C)     Temp Source 12/14/23 1151 Oral     SpO2 12/14/23 1151 96 %     Weight --      Height --      Head Circumference --      Peak Flow --      Pain Score 12/14/23 1149 3  Pain Loc --      Pain Education --      Exclude from Growth Chart --    No data found.  Updated Vital Signs BP (!) 132/90 (BP Location: Right Arm)   Pulse 91   Temp 98.4 F (36.9 C) (Oral)   Resp 18   SpO2 96%   Visual Acuity Right Eye Distance:   Left Eye Distance:   Bilateral Distance:    Right Eye Near:   Left Eye Near:    Bilateral Near:     Physical Exam Vitals and nursing note reviewed.  Constitutional:      Appearance: Normal appearance. He is obese. He is ill-appearing.  HENT:     Head: Normocephalic and atraumatic.     Right Ear: Tympanic membrane and external ear normal.     Left Ear: Tympanic membrane and external ear normal.     Mouth/Throat:     Mouth: Mucous membranes are moist.     Pharynx: Oropharynx is clear.     Comments: Significant amount of clear drainage of posterior oropharynx noted Eyes:     Extraocular Movements: Extraocular movements intact.     Pupils: Pupils are equal, round, and reactive to light.  Cardiovascular:     Rate and Rhythm: Normal rate and regular rhythm.     Pulses: Normal pulses.     Heart sounds: Normal heart sounds.  Pulmonary:     Effort: Pulmonary effort is normal.     Breath sounds: Normal breath sounds. No wheezing, rhonchi or rales.  Musculoskeletal:        General: Normal range of motion.  Skin:    General: Skin is warm and dry.  Neurological:     General: No focal deficit present.     Mental Status: He is alert and oriented to person, place, and time.  Mental status is at baseline.  Psychiatric:        Mood and Affect: Mood normal.        Behavior: Behavior normal.      UC Treatments / Results  Labs (all labs ordered are listed, but only abnormal results are displayed) Labs Reviewed - No data to display  EKG   Radiology No results found.  Procedures Procedures (including critical care time)  Medications Ordered in UC Medications - No data to display  Initial Impression / Assessment and Plan / UC Course  I have reviewed the triage vital signs and the nursing notes.  Pertinent labs & imaging results that were available during my care of the patient were reviewed by me and considered in my medical decision making (see chart for details).     MDM: 1.  Acute maxillary sinusitis, recurrence not specified-Rx'd Augmentin  875/125 mg tablet: Take 1 tablet twice daily x 7 days; 2.  Congestion of nasal sinus-Rx'd prednisone  20 mg tablet: Take 3 tablets p.o. daily x 5 days. Advised patient to take medication as directed with food to completion.  Advised patient take prednisone  with first dose of Augmentin  for the next 5 of 7 days.  Encouraged to increase daily water  intake to 64 ounces per day while taking these medications.  Advised if symptoms worsen and or unresolved please follow-up with your PCP or here for further evaluation.  Patient discharged home, hemodynamically stable. Final Clinical Impressions(s) / UC Diagnoses   Final diagnoses:  Acute maxillary sinusitis, recurrence not specified  Congestion of nasal sinus     Discharge Instructions      Advised patient  to take medication as directed with food to completion.  Advised patient take prednisone  with first dose of Augmentin  for the next 5 of 7 days.  Encouraged to increase daily water  intake to 64 ounces per day while taking these medications.  Advised if symptoms worsen and or unresolved please follow-up with your PCP or here for further evaluation.     ED  Prescriptions     Medication Sig Dispense Auth. Provider   amoxicillin -clavulanate (AUGMENTIN ) 875-125 MG tablet Take 1 tablet by mouth every 12 (twelve) hours. 14 tablet Waynesha Rammel, FNP   predniSONE  (DELTASONE ) 20 MG tablet Take 3 tabs PO daily x 5 days. 15 tablet Leyli Kevorkian, FNP      PDMP not reviewed this encounter.   Teddy Sharper, FNP 12/14/23 1319

## 2023-12-14 NOTE — ED Triage Notes (Signed)
 Pt reports a headache, sinus pressure, intermittent cough and sore throat. Symptoms began on Saturday. Has tried Zyrtec  D with some relief

## 2023-12-24 ENCOUNTER — Other Ambulatory Visit: Payer: Self-pay

## 2023-12-29 ENCOUNTER — Encounter: Payer: Self-pay | Admitting: Sports Medicine

## 2023-12-30 ENCOUNTER — Ambulatory Visit: Admitting: Urology

## 2023-12-31 ENCOUNTER — Ambulatory Visit: Admitting: Urology

## 2024-01-22 ENCOUNTER — Ambulatory Visit (INDEPENDENT_AMBULATORY_CARE_PROVIDER_SITE_OTHER)

## 2024-01-22 DIAGNOSIS — N1330 Unspecified hydronephrosis: Secondary | ICD-10-CM

## 2024-01-25 ENCOUNTER — Other Ambulatory Visit (HOSPITAL_BASED_OUTPATIENT_CLINIC_OR_DEPARTMENT_OTHER): Payer: Self-pay

## 2024-01-25 ENCOUNTER — Ambulatory Visit

## 2024-01-25 ENCOUNTER — Other Ambulatory Visit: Payer: Self-pay

## 2024-01-25 VITALS — BP 132/84 | Ht 67.0 in | Wt 215.0 lb

## 2024-01-25 DIAGNOSIS — M1711 Unilateral primary osteoarthritis, right knee: Secondary | ICD-10-CM

## 2024-01-25 MED ORDER — NAPROXEN 500 MG PO TABS
500.0000 mg | ORAL_TABLET | Freq: Two times a day (BID) | ORAL | 1 refills | Status: DC
  Filled 2024-01-25: qty 28, 14d supply, fill #0
  Filled 2024-02-04 – 2024-02-05 (×2): qty 28, 14d supply, fill #1

## 2024-01-25 MED ORDER — METHYLPREDNISOLONE ACETATE 40 MG/ML IJ SUSP
40.0000 mg | Freq: Once | INTRAMUSCULAR | Status: AC
  Administered 2024-01-25: 40 mg via INTRA_ARTICULAR

## 2024-01-25 NOTE — Progress Notes (Signed)
 Subjective:    Patient ID: Brandon Riley, male    DOB: 63 y.o., 1961/03/22   MRN: [id number]  HPI  Chief Complaint: Right knee pain  63 year old male with past medical history significant for left total knee arthroplasty (06/2023), left knee arthroscopic meniscal repair (10/2018), right knee osteoarthritis and meniscal injury.  Last fill provided for hydrocodone  05/26/2023 by Dr. Curtis. On 09/25/2023 received 15 quantity of tramadol  50 mg prescribed by Brandon Riley. Subsequent to this has received prescription for oxycodone  5 mg (quantity 10) by Brandon Riley on 09/26/2023. Most recently on 7/18, Brandon Riley provided additional 15 pills of tramadol  50 mg.   Reports pain started 2 weeks ago Has had swelling in the knee as well There is some cracking in the knee but no locking Unable to twist knee Pain is significant enough to cause him to limp No known significant injury or change in activity preceding this No history of surgery in this right knee Suspicious that based on MRI findings in 2023 that he will likely require knee replacement however after a poor experience with pain management/anesthesia with his left knee replacement, he wishes to stave this off as long as possible. Taking oxycodone , tramadol  intermittently for this.  Review of prior imaging: 03/01/2022 right knee MRI per my independent review showing full-thickness cartilage loss and underlying bone marrow edema most prominent in the medial compartment on the tibial side.  Fraying and tearing of the meniscus most prominently noted in the posterior horn.  Effusion.  Prominent Baker's cyst.    Objective:   Physical Exam Vitals:   01/25/24 1106  BP: 132/84   Right Knee (compared to normal) -Inspection: [no] swelling, erythema, deformity or visible effusion. -Palpation: TTP - quad tendon, - patella, - patellar tendon, - tibial tuberosity, - pes bursa, - gerdy tubercle, - medial joint line, ++ lateral joint line, +  posterior knee, - medial and lateral hamstrings. No significant crepitus with flexion/extension. -AROM/PROM: 7 degrees extension, 100 degrees flexion, low hamstring flexibility -Strength: Unable to tolerate single-leg squat.  5/5 flexion, 5/5 extension -Special tests:    -ACL: - lachman, - lever test   -MCL: 1-2+ valgus at 0/30 degrees.  Pain localizing laterally.   -LCL: 1+ varus at 0/30 degrees.  Pain localizing laterally.   -PCL: - sag sign   -Meniscus: + thessaly, + McMurray, equivocal Apley compression.   -Patellofemoral: - patellar grind  Knee Joint Synovial Fluid Aspiration with Ultrasound Guidance Brandon Riley 1961/01/21 Indications: Pain Procedure Details Following the description of risks including infection bleeding, damage to surrounding structures, patient provided written consent for right Knee joint aspiration / injection with ultrasound guidance. Patient prepped with Chloraprep. Ethyl chloride for anesthesia. 3cc of Lidocaine  1% with 0.25cc of Sodium Bicarbonate 8.4% used in wheal then injected Subcutaneous fashion with 22 gauge needle on superolateral approach. Under sterile conditions, 18 gauge needle used via lateral approach to aspirate 15 cc of serous fluid.  Then 4cc of Mepivicaine 2% and 1 mL of Depo-Medrol  40 mg injected. Tolerated well, decreased pain, no complications.     Assessment & Plan:   Brandon Riley is a very pleasant 63 year old male with well-documented osteoarthritis of his right knee based on prior MR imaging done in 2023.  He reports a significant worsening of pain/stiffness/swelling in his knee in the past 2 weeks.  I am suspicious that there is meniscal involvement but that he is likely experiencing a significant flare of arthritis.  I provided an aspiration to allow for  improved range of motion given the limitation I saw him this on exam, and discussed the possibility of obtaining advanced imaging to assess his current status compared to 2023.  The patient prefers  to push this off for now but is interested in a corticosteroid injection.  I provided this with ultrasound guidance as well as a short course of an anti-inflammatory (naproxen ) and his creatinine is appropriate at 1.15 to tolerate this for several weeks.  I will follow-up with him regarding what his prior knee surgeon, Brandon Riley, has to say regarding recommendations going forward for him.  Body mass index is 33.67 kg/m. Lab Results  Component Value Date   CREATININE 1.15 11/03/2023

## 2024-01-28 ENCOUNTER — Ambulatory Visit: Payer: Self-pay | Admitting: Urology

## 2024-02-01 DIAGNOSIS — M1711 Unilateral primary osteoarthritis, right knee: Secondary | ICD-10-CM

## 2024-02-04 ENCOUNTER — Other Ambulatory Visit (HOSPITAL_BASED_OUTPATIENT_CLINIC_OR_DEPARTMENT_OTHER): Payer: Self-pay

## 2024-02-04 ENCOUNTER — Ambulatory Visit: Admitting: Urology

## 2024-02-04 VITALS — BP 138/88 | HR 68 | Wt 225.0 lb

## 2024-02-04 DIAGNOSIS — N138 Other obstructive and reflux uropathy: Secondary | ICD-10-CM

## 2024-02-04 DIAGNOSIS — N401 Enlarged prostate with lower urinary tract symptoms: Secondary | ICD-10-CM

## 2024-02-04 NOTE — Progress Notes (Signed)
   02/04/2024 1:11 PM   Norleen GORMAN [place] 10/18/1960 [id number]  Reason for visit: Follow up BPH status post HOLEP, left hydronephrosis  History: Originally referred from Dr. Roseann for BPH with Foley dependent urinary retention, long history of elevated PSA with negative biopsies, prostate volume greater than 100 g Underwent HOLEP 09/25/2023 with removal of 105 g benign tissue, had multiple admissions at outside hospitals for gross hematuria/clot retention as well as new left hydronephrosis, and a left retroperitoneal fluid collection.  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  Ultimately had a drain placed by IR. OR 11/12/2023 transurethral resection of left ureteral orifice and opened, stent placed.  Drain removed.  Stent removed 12/03/2023.  Physical Exam: BP 138/88 (BP Location: Left Arm, Patient Position: Sitting, Cuff Size: Normal)   Pulse 68   Wt 225 lb (102.1 kg)   SpO2 99%   BMI 35.24 kg/m   Imaging/labs: Renal ultrasound 01/22/2024 with no hydronephrosis, decompressed bladder  Today: PVR today 11ml Urinating well with a good stream, very mild urgency, rare urge incontinence, no significant flank pain Denies any gross hematuria or dysuria  Plan:   BPH: Symptoms continue to improve, emptying appropriately, consider OAB medication in the future if persistent symptoms Recommend repeat renal ultrasound in 6 months to confirm no hydronephrosis Will message Dr. Roseann to coordinate follow-up locally   Redell JAYSON Burnet, MD  [facility] [address] [place], [place] [address] [phone]

## 2024-02-08 ENCOUNTER — Other Ambulatory Visit: Payer: Self-pay | Admitting: Urgent Care

## 2024-02-08 ENCOUNTER — Telehealth: Payer: Self-pay

## 2024-02-08 ENCOUNTER — Telehealth: Payer: Self-pay | Admitting: *Deleted

## 2024-02-08 MED ORDER — METOPROLOL TARTRATE 25 MG PO TABS: 25.0000 mg | ORAL_TABLET | Freq: Two times a day (BID) | ORAL | 0 refills | Status: DC

## 2024-02-08 NOTE — Telephone Encounter (Signed)
 Refilled by [facility] today.

## 2024-02-08 NOTE — Telephone Encounter (Signed)
 Copied from CRM [id number]. Topic: Clinical - Medical Advice >> Feb 08, 2024 10:44 AM Gustabo D wrote: Pt is out of his bp medication since yesterday and is needing a refill sent to [facility] -pt wife is on the line and doesn't know the name of the medication please call her back

## 2024-02-08 NOTE — Telephone Encounter (Signed)
 Copied from CRM [id number]. Topic: Clinical - Medication Question >> Feb 08, 2024 11:31 AM Treva T wrote: Reason for CRM: Received call from Jonnea, with [facility] ,calling to inquire if faxed medication request was received for patient.   Medication: Metoprolol  Tartrate 25 mg. (blood pressure)  Per chart review, rx has been scanned in under media, but no approval or provider signature.  Pharmacy can be reached back at [phone], for further directives of medication refill.  Pharmacy aware of same day call back.    [facility] [id number] - [place], [place] - [address]  [place] [address] Phone: [phone] Fax: [phone]

## 2024-02-08 NOTE — Progress Notes (Unsigned)
 Complex Care Management Care Guide Note  02/08/2024 Name: Brandon Riley MRN: [id number] DOB: Apr 26, 1961  Brandon Riley is a 63 y.o. year old male who is a primary care patient of Curtis, Debby PARAS, MD and is actively engaged with the care management team. I reached out to Brandon Riley by phone today to assist with re-scheduling  with the RN Case Manager.  Follow up plan: Unsuccessful telephone outreach attempt made. A HIPAA compliant phone message was left for the patient providing contact information and requesting a return call.  Thedford Franks, CMA [facility]  [facility], [facility] Guide Direct Dial: [phone]  Fax: [phone] Website: [email or web address]

## 2024-02-08 NOTE — Progress Notes (Signed)
 Received Rx request for metoprolol  tartrate 25mg  BID. Will call in enough to get pt to [facility] appointment.

## 2024-02-08 NOTE — Telephone Encounter (Signed)
 Forwarding message to Zada Palin, NP covering Dr. Curtis I did not see a BP medication listed in patient chart -  Attempted call to patient to ask what medication name he is requesting  as do not see any BP medications in current med list.

## 2024-02-09 NOTE — Progress Notes (Signed)
 Complex Care Management Care Guide Note  02/09/2024 Name: [name] MRN: [id number] DOB: October 18, 1960  Brandon Riley is a 63 y.o. year old male who is a primary care patient of Curtis, Debby PARAS, MD and is actively engaged with the care management team. I reached out to Brandon Riley by phone today to assist with re-scheduling  with the RN Case Manager.  Follow up plan: Unsuccessful telephone outreach attempt made. A HIPAA compliant phone message was left for the patient providing contact information and requesting a return call. No further outreach attempts will be made due to inability to maintain patient contact.   Thedford Franks, CMA, Care Guide [facility]  [facility], [facility] Guide Direct Dial: [phone]  Fax: [phone] Website: [email or web address]

## 2024-02-16 ENCOUNTER — Ambulatory Visit: Admitting: Urology

## 2024-02-17 ENCOUNTER — Other Ambulatory Visit: Payer: Self-pay

## 2024-02-17 ENCOUNTER — Encounter: Payer: Self-pay | Admitting: Urgent Care

## 2024-02-17 ENCOUNTER — Ambulatory Visit: Admitting: Urgent Care

## 2024-02-17 VITALS — BP 132/81 | HR 68 | Ht 67.0 in | Wt 232.0 lb

## 2024-02-17 DIAGNOSIS — K567 Ileus, unspecified: Secondary | ICD-10-CM

## 2024-02-17 DIAGNOSIS — M5030 Other cervical disc degeneration, unspecified cervical region: Secondary | ICD-10-CM | POA: Diagnosis not present

## 2024-02-17 DIAGNOSIS — I10 Essential (primary) hypertension: Secondary | ICD-10-CM | POA: Diagnosis not present

## 2024-02-17 DIAGNOSIS — G2581 Restless legs syndrome: Secondary | ICD-10-CM

## 2024-02-17 DIAGNOSIS — R972 Elevated prostate specific antigen [PSA]: Secondary | ICD-10-CM

## 2024-02-17 DIAGNOSIS — Z23 Encounter for immunization: Secondary | ICD-10-CM | POA: Diagnosis not present

## 2024-02-17 DIAGNOSIS — K9189 Other postprocedural complications and disorders of digestive system: Secondary | ICD-10-CM

## 2024-02-17 DIAGNOSIS — M533 Sacrococcygeal disorders, not elsewhere classified: Secondary | ICD-10-CM | POA: Diagnosis not present

## 2024-02-17 DIAGNOSIS — M1711 Unilateral primary osteoarthritis, right knee: Secondary | ICD-10-CM | POA: Diagnosis not present

## 2024-02-17 MED ORDER — NAPROXEN 500 MG PO TABS: 500.0000 mg | ORAL_TABLET | Freq: Two times a day (BID) | ORAL | 1 refills | Status: AC

## 2024-02-17 NOTE — Progress Notes (Signed)
 Established Patient Office Visit  Subjective:  Patient ID: Brandon Riley, male    DOB: 1961-01-14  Age: 63 y.o. MRN: [id number]  Chief Complaint  Patient presents with   Establish Care    HPI  Discussed the use of AI scribe software for clinical note transcription with the patient, who gave verbal consent to proceed.  History of Present Illness   [name] is a 63 year old male who presents for a consult to establish care.  He has a complex medical history, including knee replacement surgery in March, which was complicated by a failed epidural anesthesia. Post-surgery, he experienced urinary retention, leading to prostate surgery and subsequent ileus. He also had a kidney issue with leakage causing pressure on his spleen and kidney, necessitating a drainage tube, which was removed around late August or early September. No prior symptoms of prostate issues before the knee replacement and epidural incident. An enlarged prostate was detected two to three years ago, but it was not problematic at the time. Approximately 80% of the prostate was removed during surgery.  He is currently taking metoprolol , naproxen , Requip , and Crestor . He wants to transfer his naproxen  prescription to a more convenient pharmacy. He tolerates his medications well. He takes metoprolol  for blood pressure management, which he believes was elevated due to knee pain. He monitors his blood pressure occasionally and reports it has been stable. He takes Requip  for restless leg syndrome, which he finds effective, and Crestor  for cholesterol management.  He reports ongoing issues with his knees. The left knee, which underwent replacement, is doing well at about 95% functionality. The right knee, which has not been replaced, is problematic and he uses naproxen  for pain management. He is still able to perform construction work and finds naproxen  helpful without significant gastrointestinal side effects. He has had fluid  aspiration and steroid injection in the right knee about two to four weeks ago, which provided relief.  He experiences numbness in his hands at night and has had a neck injection previously, which he found helpful. He occasionally experiences flank pain, especially during physical activities like golf, but notes it has gradually subsided. He is retired but remains active in [employer] work, which he enjoys.  He has a pending PET scan scheduled for Friday. The ileus has resolved, he is eating and having regular bowel movements, and the drainage tube is no longer in place.       Patient Active Problem List   Diagnosis Date Noted   Skin rash 11/26/2023   Perinephric fluid collection 11/03/2023   Abdominal fluid collection 10/23/2023   Intertrigo 10/19/2023   Hydronephrosis 10/14/2023   AKI (acute kidney injury) 09/27/2023   Hypersomnia 09/27/2023   Ileus, postoperative (HCC) 09/27/2023   Hydronephrosis of left kidney 09/27/2023   Malignant neoplasm of spine (HCC) 08/31/2023   Sacral lesion 08/04/2023   Urinary retention 08/03/2023   History of total knee arthroplasty, left 07/20/2023   Preoperative clearance 05/12/2023   COVID-19 03/30/2023   Restless leg syndrome 10/08/2022   Dermatitis 09/01/2022   Prediabetes 07/09/2021   BPH (benign prostatic hyperplasia) 06/12/2021   Elevated PSA 09/07/2020   Polyarthralgia 08/31/2020   Steatohepatitis 05/16/2020   Open fracture of left thumb 01/31/2020   GERD (gastroesophageal reflux disease) 11/07/2016   Primary osteoarthritis of right wrist 09/29/2016   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   Left insertional Achilles tendinosis 11/05/2015   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   Obstructive sleep apnea on BiPAP  with periodic limb movement disorder 06/05/2014   Obesity 05/02/2013   Annual physical exam 04/04/2013   Primary osteoarthritis of both knees with pseudogout 04/04/2013   Degenerative disc disease, cervical  04/04/2013   Hyperlipidemia 04/04/2013   Benign essential hypertension 04/04/2013   Lumbar degenerative disc disease 04/04/2013   Past Medical History:  Diagnosis Date   Allergy Seasonal   Grass & pollens   Arthritis Knees & areas   BPH (benign prostatic hyperplasia)    Complication of anesthesia    epidural did not work ended up with general and now trouble w/ urinatiing needing laser enucleation of prastate   GERD (gastroesophageal reflux disease)    Hydronephrosis of left kidney    Hyperlipemia    Hypertension    OSA on CPAP    Restless leg syndrome    Sleep apnea    Severe sleep apnea   Past Surgical History:  Procedure Laterality Date   CHONDROPLASTY Left 10/28/2018   Procedure: CHONDROPLASTY;  Surgeon: Cristy Bonner DASEN, MD;  Location: [facility];  Service: Orthopedics;  Laterality: Left;   CYSTOSCOPY WITH STENT PLACEMENT Left 11/12/2023   Procedure: CYSTOSCOPY, WITH STENT INSERTION;  Surgeon: Francisca Redell BROCKS, MD;  Location: [facility];  Service: Urology;  Laterality: Left;   HOLEP-LASER ENUCLEATION OF THE PROSTATE WITH MORCELLATION N/A 09/25/2023   Procedure: ENUCLEATION, PROSTATE, USING LASER, WITH MORCELLATION;  Surgeon: Francisca Redell BROCKS, MD;  Location: [facility];  Service: Urology;  Laterality: N/A;   IR RADIOLOGIST EVAL & MGMT  11/09/2023   IR RADIOLOGIST EVAL & MGMT  11/18/2023   JOINT REPLACEMENT  07/20/23   Left knee   KNEE ARTHROSCOPY WITH MEDIAL MENISECTOMY Left 10/28/2018   Procedure: LEFT KNEE ARTHROSCOPY WITH MEDIAL MENISECTOMY;  Surgeon: Cristy Bonner DASEN, MD;  Location: [facility];  Service: Orthopedics;  Laterality: Left;   RECONSTRUCTION OF NOSE  2019   TOTAL KNEE ARTHROPLASTY Left 07/20/2023   Procedure: ARTHROPLASTY, KNEE, TOTAL;  Surgeon: Edna Toribio LABOR, MD;  Location: [facility];  Service: Orthopedics;  Laterality: Left;   WISDOM TOOTH EXTRACTION  2012   Social History   Tobacco Use   Smoking status: Never    Passive exposure:  Never   Smokeless tobacco: Never  Vaping Use   Vaping status: Never Used  Substance Use Topics   Alcohol  use: Not Currently    Comment: 2-3x per week   Drug use: Never      ROS: as noted in HPI  Objective:     BP 132/81   Pulse 68   Ht 5' 7 (1.702 m)   Wt 232 lb (105.2 kg)   SpO2 97%   BMI 36.34 kg/m  BP Readings from Last 3 Encounters:  02/17/24 132/81  02/04/24 138/88  01/25/24 132/84   Wt Readings from Last 3 Encounters:  02/17/24 232 lb (105.2 kg)  02/04/24 225 lb (102.1 kg)  01/25/24 215 lb (97.5 kg)      Physical Exam Vitals and nursing note reviewed. Exam conducted with a chaperone present.  Constitutional:      General: He is not in acute distress.    Appearance: Normal appearance. He is not ill-appearing, toxic-appearing or diaphoretic.  HENT:     Head: Normocephalic and atraumatic.     Right Ear: External ear normal.     Left Ear: External ear normal.     Nose: Nose normal.     Mouth/Throat:     Mouth: Mucous membranes are moist.  Eyes:  General: No scleral icterus.       Right eye: No discharge.        Left eye: No discharge.     Pupils: Pupils are equal, round, and reactive to light.  Cardiovascular:     Rate and Rhythm: Normal rate and regular rhythm.  Pulmonary:     Effort: Pulmonary effort is normal. No respiratory distress.     Breath sounds: Normal breath sounds. No stridor.  Abdominal:     General: Abdomen is flat. Bowel sounds are normal.     Tenderness: There is no right CVA tenderness, left CVA tenderness, guarding or rebound.  Musculoskeletal:     Cervical back: Normal range of motion and neck supple.  Lymphadenopathy:     Cervical: No cervical adenopathy.  Skin:    General: Skin is warm and dry.     Coloration: Skin is not jaundiced.     Findings: No erythema or rash.  Neurological:     General: No focal deficit present.     Mental Status: He is alert and oriented to person, place, and time.      No results found  for any visits on 02/17/24.  Last CBC Lab Results  Component Value Date   WBC 6.3 10/29/2023   HGB 11.4 (L) 10/29/2023   HCT 34.5 (L) 10/29/2023   MCV 86.9 10/29/2023   MCH 28.7 10/29/2023   RDW 13.2 10/29/2023   PLT 287 10/29/2023   Last metabolic panel Lab Results  Component Value Date   GLUCOSE 71 11/03/2023   NA 141 11/03/2023   K 4.5 11/03/2023   CL 102 11/03/2023   CO2 21 11/03/2023   BUN 19 11/03/2023   CREATININE 1.15 11/03/2023   EGFR 72 11/03/2023   CALCIUM  9.7 11/03/2023   PROT 7.0 10/08/2023   ALBUMIN 3.6 (L) 10/08/2023   LABGLOB 3.4 10/08/2023   BILITOT 0.4 10/08/2023   ALKPHOS 89 10/08/2023   AST 21 10/08/2023   ALT 27 10/08/2023   ANIONGAP 12 10/14/2023   Last lipids Lab Results  Component Value Date   CHOL 140 05/04/2023   HDL 40 05/04/2023   LDLCALC 67 05/04/2023   TRIG 200 (H) 05/04/2023   CHOLHDL 3.5 05/04/2023   Last hemoglobin A1c Lab Results  Component Value Date   HGBA1C 6.1 (H) 09/02/2023   Last thyroid  functions Lab Results  Component Value Date   TSH 1.720 05/04/2023   THYROIDAB 38 (H) 09/06/2020   Last vitamin D No results found for: 25OHVITD2, 25OHVITD3, VD25OH Last vitamin B12 and Folate No results found for: VITAMINB12, FOLATE    The 10-year ASCVD risk score (Arnett DK, et al., 2019) is: 11.7%  Assessment & Plan:  Sacral lesion  Immunization due -     Flu vaccine trivalent PF, 6mos and older(Flulaval,Afluria,Fluarix,Fluzone)  Primary osteoarthritis of right knee -     Naproxen ; Take 1 tablet (500 mg total) by mouth 2 (two) times daily with a meal.  Dispense: 180 tablet; Refill: 1  Restless leg syndrome  Benign essential hypertension -     CMP14+EGFR  Elevated PSA  Ileus, postoperative (HCC)  Degenerative disc disease, cervical  Assessment and Plan    Right knee osteoarthritis Chronic tricompartmental osteoarthritis, severe in medial aspect. Recent steroid injection provided relief. - Transfer  naproxen  prescription to [facility] pharmacy. - Advise taking naproxen  with food to prevent gastrointestinal upset. - Monitor CBC and kidney function due to NSAID use. - Consider repeat steroid injection after December 29 if  necessary. - Discuss potential for VSCO injections if steroid injections become ineffective.  Left knee status post total knee replacement Good recovery post-surgery with 95% functionality and no complications.  Cervical radiculopathy Recurrent symptoms of numbness in hands after previous relief from neck injection. - Order neck injection at [facility].  Restless legs syndrome Symptoms well-controlled with ropironol 3 mg at bedtime, occasionally 4 mg after heavy activity.  Hypertension Well-controlled on low-dose metoprolol  with normal office blood pressure readings.  Hyperlipidemia Managed with Crestor , no issues discussed.  Chronic kidney disease Improved kidney function noted in July labs, no symptoms reported. - Order CMP to monitor kidney function.  History of urinary retention and prostate surgery No current symptoms post partial prostate removal surgery.  History of ileus Resolved with normal eating and bowel movements.  General Health Maintenance Routine vaccinations and screenings discussed. No pneumococcal vaccination received. - Administer flu shot today.       I spent 45 minutes of total time managing this patient today, this includes chart review, face to face, and non-face to face time, reviewing outside records and labs and providing personal interpretation.   Return in about 3 months (around 05/19/2024) for Annual Physical.   Benton LITTIE Gave, PA

## 2024-02-17 NOTE — Patient Instructions (Signed)
 Schedule annual in January with fasting labs.  Complete PET scan - we will be in contact via Mychart with results.

## 2024-02-18 ENCOUNTER — Other Ambulatory Visit (HOSPITAL_BASED_OUTPATIENT_CLINIC_OR_DEPARTMENT_OTHER): Payer: Self-pay

## 2024-02-18 ENCOUNTER — Ambulatory Visit: Payer: Self-pay | Admitting: Urgent Care

## 2024-02-18 DIAGNOSIS — R17 Unspecified jaundice: Secondary | ICD-10-CM

## 2024-02-18 LAB — CMP14+EGFR
ALT: 32 IU/L (ref 0–44)
AST: 26 IU/L (ref 0–40)
Albumin: 4.6 g/dL (ref 3.9–4.9)
Alkaline Phosphatase: 85 IU/L (ref 47–123)
BUN/Creatinine Ratio: 15 (ref 10–24)
BUN: 15 mg/dL (ref 8–27)
Bilirubin Total: 1.6 mg/dL — ABNORMAL HIGH (ref 0.0–1.2)
CO2: 20 mmol/L (ref 20–29)
Calcium: 9.8 mg/dL (ref 8.6–10.2)
Chloride: 103 mmol/L (ref 96–106)
Creatinine, Ser: 0.97 mg/dL (ref 0.76–1.27)
Globulin, Total: 2.6 g/dL (ref 1.5–4.5)
Glucose: 106 mg/dL — ABNORMAL HIGH (ref 70–99)
Potassium: 4.3 mmol/L (ref 3.5–5.2)
Sodium: 138 mmol/L (ref 134–144)
Total Protein: 7.2 g/dL (ref 6.0–8.5)
eGFR: 88 mL/min/1.73 (ref >59)

## 2024-02-19 ENCOUNTER — Encounter (HOSPITAL_COMMUNITY)
Admission: RE | Admit: 2024-02-19 | Discharge: 2024-02-19 | Disposition: A | Discharge status: Home / Self Care | Source: Ambulatory Visit | Attending: Sports Medicine | Admitting: Sports Medicine

## 2024-02-19 DIAGNOSIS — M533 Sacrococcygeal disorders, not elsewhere classified: Secondary | ICD-10-CM | POA: Diagnosis not present

## 2024-02-19 DIAGNOSIS — N281 Cyst of kidney, acquired: Secondary | ICD-10-CM | POA: Diagnosis not present

## 2024-02-19 LAB — GLUCOSE, CAPILLARY: Glucose-Capillary: 89 mg/dL (ref 70–99)

## 2024-02-19 MED ORDER — FLUDEOXYGLUCOSE F - 18 (FDG) INJECTION
12.0000 | Freq: Once | INTRAVENOUS | Status: AC
  Administered 2024-02-19: 11.35 via INTRAVENOUS

## 2024-03-07 ENCOUNTER — Other Ambulatory Visit: Payer: Self-pay | Admitting: Urgent Care

## 2024-03-07 DIAGNOSIS — M1711 Unilateral primary osteoarthritis, right knee: Secondary | ICD-10-CM | POA: Diagnosis not present

## 2024-03-07 DIAGNOSIS — M25561 Pain in right knee: Secondary | ICD-10-CM | POA: Diagnosis not present

## 2024-03-15 ENCOUNTER — Other Ambulatory Visit: Payer: Self-pay | Admitting: Urgent Care

## 2024-03-15 NOTE — Telephone Encounter (Unsigned)
 Copied from CRM #[id number]. Topic: Clinical - Medication Refill >> Mar 15, 2024 12:50 PM Lauren C wrote: Medication: metoprolol  tartrate (LOPRESSOR ) 25 MG tablet  Has the patient contacted their pharmacy? Yes Brandon Riley is calling on his behalf. They say they have been trouble getting faxes to us  and he has been requesting this medication stating he has been out for days.   This is the patient's preferred pharmacy:  [facility] [id number] - [place], [place] - [address] [place] [address] Phone: [phone] Fax: [phone]  Is this the correct pharmacy for this prescription? Yes If no, delete pharmacy and type the correct one.   Has the prescription been filled recently? Yes  Is the patient out of the medication? Yes  Has the patient been seen for an appointment in the last year OR does the patient have an upcoming appointment? Yes  Can we respond through MyChart? Yes  Agent: Please be advised that Rx refills may take up to 3 business days. We ask that you follow-up with your pharmacy.

## 2024-03-16 MED ORDER — METOPROLOL TARTRATE 25 MG PO TABS: 25.0000 mg | ORAL_TABLET | Freq: Two times a day (BID) | ORAL | 0 refills | Status: AC

## 2024-03-16 NOTE — Telephone Encounter (Signed)
 Requesting rx rf of Metoprolol  tartrate 25mg  tablet Last written 02/08/2024 as 30 day supply Last OV 02/17/2024 Upcoming appt = none

## 2024-03-17 ENCOUNTER — Ambulatory Visit (INDEPENDENT_AMBULATORY_CARE_PROVIDER_SITE_OTHER): Admitting: Bariatrics

## 2024-03-17 ENCOUNTER — Encounter: Payer: Self-pay | Admitting: Bariatrics

## 2024-03-17 VITALS — BP 138/88 | HR 68 | Ht 67.0 in | Wt 231.0 lb

## 2024-03-17 DIAGNOSIS — E65 Localized adiposity: Secondary | ICD-10-CM

## 2024-03-17 DIAGNOSIS — Z6836 Body mass index (BMI) 36.0-36.9, adult: Secondary | ICD-10-CM

## 2024-03-17 DIAGNOSIS — G4733 Obstructive sleep apnea (adult) (pediatric): Secondary | ICD-10-CM

## 2024-03-17 DIAGNOSIS — K7581 Nonalcoholic steatohepatitis (NASH): Secondary | ICD-10-CM

## 2024-03-17 DIAGNOSIS — E66812 Obesity, class 2: Secondary | ICD-10-CM | POA: Diagnosis not present

## 2024-03-17 DIAGNOSIS — R7303 Prediabetes: Secondary | ICD-10-CM | POA: Diagnosis not present

## 2024-03-17 DIAGNOSIS — E559 Vitamin D deficiency, unspecified: Secondary | ICD-10-CM | POA: Diagnosis not present

## 2024-03-17 DIAGNOSIS — Z6837 Body mass index (BMI) 37.0-37.9, adult: Secondary | ICD-10-CM

## 2024-03-17 DIAGNOSIS — R5383 Other fatigue: Secondary | ICD-10-CM | POA: Diagnosis not present

## 2024-03-17 DIAGNOSIS — E669 Obesity, unspecified: Secondary | ICD-10-CM

## 2024-03-17 DIAGNOSIS — R0602 Shortness of breath: Secondary | ICD-10-CM | POA: Diagnosis not present

## 2024-03-17 DIAGNOSIS — Z0000 Encounter for general adult medical examination without abnormal findings: Secondary | ICD-10-CM

## 2024-03-17 DIAGNOSIS — Z1331 Encounter for screening for depression: Secondary | ICD-10-CM | POA: Diagnosis not present

## 2024-03-17 NOTE — Progress Notes (Signed)
 At a Glance:  Vitals BP: 138/88 Pulse Rate: 68 SpO2: 97 %   Anthropometric Measurements Height: 5' 7 (1.702 m) Weight: 231 lb (104.8 kg) BMI (Calculated): 36.17 Starting Weight: 231lb Peak Weight: 250lb   Body Composition  Body Fat %: 33.2 % Fat Mass (lbs): 76.8 lbs Muscle Mass (lbs): 146.8 lbs Total Body Water  (lbs): 106.8 lbs Visceral Fat Rating : 20   Other Clinical Data RMR: 2074 Fasting: yes Labs: yes Today's Visit #: 1 Starting Date: 03/17/24     Indirect Calorimeter:   Resting Metabolic Rate ( RMR):  RMR (actual): 2074 kcal RMR (calculated): 2082 kcal The calculated basal metabolic rate is 7917 thus his basal metabolic rate is slightly worse than expected.  Plan:   Indirect calorimeter completed, interpreted and reviewed with patient today and allowed to ask questions.  Discussed the implications for the chosen plan and exercise based on the RMR reading.  Will consider repeating the RMR in the future based on weight loss.    Chief Complaint:  Obesity   Subjective:  Brandon Riley (MR# [id number]) is a 63 y.o. male who presents for evaluation and treatment of obesity and related comorbidities.   Brandon Riley is currently in the action stage of change and ready to dedicate time achieving and maintaining a healthier weight. Brandon Riley is interested in becoming our patient and working on intensive lifestyle modifications including (but not limited to) diet and exercise for weight loss.  Brandon Riley has been struggling with his weight. He has been unsuccessful in either losing weight, maintaining weight loss, or reaching his healthy weight goal.  Brandon Riley's habits were reviewed today and are as follows: he thinks his family will eat healthier with him, he started gaining weight about 15 to 20 years ago (drinking soda), he has significant food cravings issues, and he snacks frequently in the evenings.  Current or previous pharmacotherapy: None and Is interested in  pharmacotherapy  Response to medication: Never tried medications  Other Fatigue Brandon Riley admits to daytime somnolence and admits to waking up still tired. Patient has a history of symptoms of morning fatigue. Brandon Riley has a history of Obstructive sleep apnea on BiPAP. Brandon Riley generally gets 4 or 5 hours of sleep per night, and states that he has difficulty falling back asleep if awakened. Snoring is present. Apneic episodes are present. Epworth Sleepiness Score is 19.   Shortness of Breath Brandon Riley notes increasing shortness of breath with exercising and seems to be worsening over time with weight gain. He notes getting out of breath sooner with activity than he used to. This has not gotten worse recently. Brandon Riley denies shortness of breath at rest or orthopnea.  Depression Screen Brandon Riley's Food and Mood (modified PHQ-9) score was 6. 5-9 mild depression     03/17/2024    7:33 AM  Depression screen PHQ 2/9  Decreased Interest 0  Down, Depressed, Hopeless 0  PHQ - 2 Score 0  Altered sleeping 2  Tired, decreased energy 0  Change in appetite 1  Feeling bad or failure about yourself  0  Trouble concentrating 0  Moving slowly or fidgety/restless 0  Suicidal thoughts 0  PHQ-9 Score 3  Difficult doing work/chores Not difficult at all     Assessment and Plan:   Other Fatigue [name] does feel that his weight is causing his energy to be lower than it should be. Fatigue may be related to obesity, depression or many other causes. Labs will be ordered, and in the meanwhile, Brandon Riley will focus  on self care including making healthy food choices, increasing physical activity and focusing on stress reduction.  Shortness of Breath Brandon Riley does feel that he gets out of breath more easily that he used to when he exercises. [name]'s shortness of breath appears to be obesity related and exercise induced. He has agreed to work on weight loss and gradually increase exercise to treat his exercise induced shortness of breath. Will  continue to monitor closely.  Health Maintenance:   Obesity   Plan: Will do indirect calorimetry, and labs.  He had an EKG in June of this year.    Vitamin D  Deficiency He is at risk for vitamin D  deficiency due to obesity.  He is on OTC vitamin D3 1000 IU daily. No results found for: VD25OH  Plan: Will check for vitamin D  deficiency.   Brandon Riley had a positive depression screening. Depression is commonly associated with obesity and often results in emotional eating behaviors. We will monitor this closely and work on CBT to help improve the non-hunger eating patterns. Referral to Psychology may be required if no improvement is seen as he continues in our clinic.   Steatohepatitis:  He has a diagnosis of steatohepatitis on his chart. He is not aware of any specific testing except blood work.   Fibrosis 4 Score = 1.01 (Low risk)        Interpretation for patients with NAFLD          <1.30       -  F0-F1 (Low risk)          1.30-2.67 -  Indeterminate           >2.67      -  F3-F4 (High risk)     Validated for ages 62-65  Plan:  Will do CMP, may consider a hepatic panel.   Obstructive Sleep Apnea on BIPAP Brandon Riley has a diagnosis of sleep apnea. He reports that he is using a BiPAP regularly. Reports only sleeping 4 to 5 hours or less and waking up multiple times at night.   Plan: Continue BiPAP therapy. Will send for a sleep study for a re-evaluation.  Continue to practice good sleep hygiene.  Will add in elements of an antiinflammatory diet and  add in elements of a Mediterranean diet.  Reduce intake of carbohydrates for weight loss.    Prediabetes Last A1c was 6.1  Medication(s): none Lab Results  Component Value Date   HGBA1C 6.1 (H) 09/02/2023   HGBA1C 6.1 (H) 05/04/2023   HGBA1C 6.0 (H) 09/30/2022   HGBA1C 5.6 10/07/2021   HGBA1C 6.1 (H) 07/02/2021   No results found for: INSULIN   Plan: Will minimize all refined carbohydrates both sweets and starches.  Will work  on the plan and exercise.  Consider both aerobic and resistance training.  Will keep protein, water , and fiber intake high.  Increase Polyunsaturated and Monounsaturated fats to increase satiety and encourage weight loss.  Aim for 7 to 9 hours of sleep nightly.  Will consider a GLP-1 for visceral fat, and severe sleep apnea.   Visceral Obesity.   He has a visceral fat rating of 20 per the bio-impedence scale.   Plan: The goal is a visceral fat rating of 13 or below.  Will work on the plan and increase exercise.  Information sheet on  Healthy and Unhealthy fats.  Will minimize all carbohydrates ( sweets and starches ).           Previous labs reviewed today. Date:  02/17/2024 CMP, globulin and CBC.   Labs done today CMP, Lipids, Insulin, HgbA1c, Vit D, Vit B12, and Thyroid  Panel   Generalized Obesity: BMI (Calculated): 36.17   Brandon Riley is currently in the action stage of change and his goal is to begin weight loss efforts. I recommend Brandon Riley begin the structured treatment plan as follows:  He has agreed to Category 2 Plan  Exercise goals: All adults should avoid inactivity. Some activity is better than none, and adults who participate in any amount of physical activity, gain some health benefits.  Behavioral modification strategies:increasing lean protein intake, increasing vegetables, increase H2O intake, increase high fiber foods, no skipping meals, meal planning and cooking strategies, keeping healthy foods in the home, better snacking choices, and planning for success  He was informed of the importance of frequent follow-up visits to maximize his success with intensive lifestyle modifications for his multiple health conditions. He was informed we would discuss his lab results at his next visit unless there is a critical issue that needs to be addressed sooner. Brandon Riley agreed to keep his next visit at the agreed upon time to discuss these results.  Objective:  General: Cooperative,  alert, well developed, in no acute distress. HEENT: Conjunctivae and lids unremarkable. Cardiovascular: Regular rhythm.  Lungs: Normal work of breathing. Neurologic: No focal deficits.   Lab Results  Component Value Date   CREATININE 0.97 02/17/2024   BUN 15 02/17/2024   NA 138 02/17/2024   K 4.3 02/17/2024   CL 103 02/17/2024   CO2 20 02/17/2024   Lab Results  Component Value Date   ALT 32 02/17/2024   AST 26 02/17/2024   ALKPHOS 85 02/17/2024   BILITOT 1.6 (H) 02/17/2024   Lab Results  Component Value Date   HGBA1C 6.1 (H) 09/02/2023   HGBA1C 6.1 (H) 05/04/2023   HGBA1C 6.0 (H) 09/30/2022   HGBA1C 5.6 10/07/2021   HGBA1C 6.1 (H) 07/02/2021   No results found for: INSULIN Lab Results  Component Value Date   TSH 1.720 05/04/2023   Lab Results  Component Value Date   CHOL 140 05/04/2023   HDL 40 05/04/2023   LDLCALC 67 05/04/2023   TRIG 200 (H) 05/04/2023   CHOLHDL 3.5 05/04/2023   Lab Results  Component Value Date   WBC 6.3 10/29/2023   HGB 11.4 (L) 10/29/2023   HCT 34.5 (L) 10/29/2023   MCV 86.9 10/29/2023   PLT 287 10/29/2023   No results found for: IRON, TIBC, FERRITIN  Attestation Statements:  Applicable history such as the following:  allergies, medications, problem list, medical history, surgical history, family history, social history, and previous encounter notes reviewed by clinician on day of visit:  Time spent on visit in care of the patient today including the items listed below was 74 minutes.    30 minutes were spent talking about the history, 35 minutes for face to face counseling implementing the plan, discussing the specifics of how to arrange meals, meal planning, water  intake.   I spent face to face time discussing his/her plan, including breakfast, additional breakfast options, lunch, and dinner options, grocery list, and snacks.  I reviewed her indirect calorimetry. I discussed the implications for the diet plan.     Discussed the bio-impedence test (fat %, muscle mass, and water  weight) and allowed the patient to ask questions.   Discussed the following information sheets: Category 3, Grocery List, 100 Calorie Snacks, 200 Calorie Snacks, and Protein shakes, Minimally, processed breakfast options, non-starchy vegetables, and  healthy vs unhealthy fats. .   I reviewed the labs which were ordered from his/her visit on 02/17/24.   I additionally spent time documenting, reviewing, and checking the codes before submitting.   This may have been prepared with the assistance of Engineer, Civil (consulting).  Occasional wrong-word or sound-a-like substitutions may have occurred due to the inherent limitations of voice recognition software.    Clayborne Daring, DO

## 2024-03-18 ENCOUNTER — Encounter: Payer: Self-pay | Admitting: Bariatrics

## 2024-03-18 ENCOUNTER — Other Ambulatory Visit: Payer: Self-pay

## 2024-03-18 DIAGNOSIS — G2581 Restless legs syndrome: Secondary | ICD-10-CM

## 2024-03-18 LAB — LIPID PANEL WITH LDL/HDL RATIO
Cholesterol, Total: 134 mg/dL (ref 100–199)
HDL: 40 mg/dL (ref >39)
LDL Chol Calc (NIH): 67 mg/dL (ref 0–99)
LDL/HDL Ratio: 1.7 ratio (ref 0.0–3.6)
Triglycerides: 156 mg/dL — ABNORMAL HIGH (ref 0–149)
VLDL Cholesterol Cal: 27 mg/dL (ref 5–40)

## 2024-03-18 LAB — INSULIN, RANDOM: INSULIN: 28.4 u[IU]/mL — ABNORMAL HIGH (ref 2.6–24.9)

## 2024-03-18 LAB — CBC WITH DIFFERENTIAL/PLATELET
Basophils Absolute: 0.1 x10E3/uL (ref 0.0–0.2)
Basos: 1 %
EOS (ABSOLUTE): 0.3 x10E3/uL (ref 0.0–0.4)
Eos: 6 %
Hematocrit: 46.2 % (ref 37.5–51.0)
Hemoglobin: 15.1 g/dL (ref 13.0–17.7)
Immature Grans (Abs): 0 x10E3/uL (ref 0.0–0.1)
Immature Granulocytes: 0 %
Lymphocytes Absolute: 1.6 x10E3/uL (ref 0.7–3.1)
Lymphs: 34 %
MCH: 31 pg (ref 26.6–33.0)
MCHC: 32.7 g/dL (ref 31.5–35.7)
MCV: 95 fL (ref 79–97)
Monocytes Absolute: 0.6 x10E3/uL (ref 0.1–0.9)
Monocytes: 13 %
Neutrophils Absolute: 2.2 x10E3/uL (ref 1.4–7.0)
Neutrophils: 46 %
Platelets: 211 x10E3/uL (ref 150–450)
RBC: 4.87 x10E6/uL (ref 4.14–5.80)
RDW: 12.6 % (ref 11.6–15.4)
WBC: 4.7 x10E3/uL (ref 3.4–10.8)

## 2024-03-18 LAB — COMPREHENSIVE METABOLIC PANEL WITH GFR
ALT: 38 IU/L (ref 0–44)
AST: 33 IU/L (ref 0–40)
Albumin: 4.6 g/dL (ref 3.9–4.9)
Alkaline Phosphatase: 83 IU/L (ref 47–123)
BUN/Creatinine Ratio: 14 (ref 10–24)
BUN: 13 mg/dL (ref 8–27)
Bilirubin Total: 1.4 mg/dL — ABNORMAL HIGH (ref 0.0–1.2)
CO2: 20 mmol/L (ref 20–29)
Calcium: 9.6 mg/dL (ref 8.6–10.2)
Chloride: 103 mmol/L (ref 96–106)
Creatinine, Ser: 0.92 mg/dL (ref 0.76–1.27)
Globulin, Total: 2.5 g/dL (ref 1.5–4.5)
Glucose: 101 mg/dL — ABNORMAL HIGH (ref 70–99)
Potassium: 4.6 mmol/L (ref 3.5–5.2)
Sodium: 137 mmol/L (ref 134–144)
Total Protein: 7.1 g/dL (ref 6.0–8.5)
eGFR: 93 mL/min/1.73 (ref >59)

## 2024-03-18 LAB — TSH+T4F+T3FREE
Free T4: 1.2 ng/dL (ref 0.82–1.77)
T3, Free: 3.2 pg/mL (ref 2.0–4.4)
TSH: 2.51 u[IU]/mL (ref 0.450–4.500)

## 2024-03-18 LAB — VITAMIN B12: Vitamin B-12: 526 pg/mL (ref 232–1245)

## 2024-03-18 LAB — VITAMIN D 25 HYDROXY (VIT D DEFICIENCY, FRACTURES): Vit D, 25-Hydroxy: 33.5 ng/mL (ref 30.0–100.0)

## 2024-03-18 LAB — HEMOGLOBIN A1C
Est. average glucose Bld gHb Est-mCnc: 123 mg/dL
Hgb A1c MFr Bld: 5.9 % — ABNORMAL HIGH (ref 4.8–5.6)

## 2024-03-18 MED ORDER — ROPINIROLE HCL 1 MG PO TABS
3.0000 mg | ORAL_TABLET | Freq: Every day | ORAL | 3 refills | Status: AC
Start: 2024-03-18 — End: ?

## 2024-03-21 ENCOUNTER — Encounter: Payer: Self-pay | Admitting: Bariatrics

## 2024-03-21 ENCOUNTER — Encounter: Payer: Self-pay | Admitting: Urgent Care

## 2024-03-21 DIAGNOSIS — E88819 Insulin resistance, unspecified: Secondary | ICD-10-CM | POA: Insufficient documentation

## 2024-03-21 DIAGNOSIS — E559 Vitamin D deficiency, unspecified: Secondary | ICD-10-CM | POA: Insufficient documentation

## 2024-03-21 DIAGNOSIS — R5383 Other fatigue: Secondary | ICD-10-CM

## 2024-03-28 DIAGNOSIS — R5383 Other fatigue: Secondary | ICD-10-CM | POA: Diagnosis not present

## 2024-03-29 ENCOUNTER — Ambulatory Visit: Payer: Self-pay | Admitting: Urgent Care

## 2024-03-29 DIAGNOSIS — G4733 Obstructive sleep apnea (adult) (pediatric): Secondary | ICD-10-CM

## 2024-03-29 LAB — TESTOSTERONE,FREE AND TOTAL
Testosterone, Free: 7.9 pg/mL (ref 6.6–18.1)
Testosterone: 437 ng/dL (ref 264–916)

## 2024-03-31 ENCOUNTER — Other Ambulatory Visit: Payer: Self-pay | Admitting: Urgent Care

## 2024-03-31 ENCOUNTER — Telehealth: Payer: Self-pay

## 2024-03-31 ENCOUNTER — Encounter: Payer: Self-pay | Admitting: Bariatrics

## 2024-03-31 ENCOUNTER — Ambulatory Visit: Admitting: Bariatrics

## 2024-03-31 VITALS — BP 136/90 | HR 75 | Ht 67.0 in | Wt 232.0 lb

## 2024-03-31 DIAGNOSIS — Z6836 Body mass index (BMI) 36.0-36.9, adult: Secondary | ICD-10-CM

## 2024-03-31 DIAGNOSIS — R632 Polyphagia: Secondary | ICD-10-CM | POA: Diagnosis not present

## 2024-03-31 DIAGNOSIS — G4733 Obstructive sleep apnea (adult) (pediatric): Secondary | ICD-10-CM | POA: Diagnosis not present

## 2024-03-31 DIAGNOSIS — E669 Obesity, unspecified: Secondary | ICD-10-CM | POA: Diagnosis not present

## 2024-03-31 DIAGNOSIS — E559 Vitamin D deficiency, unspecified: Secondary | ICD-10-CM | POA: Diagnosis not present

## 2024-03-31 DIAGNOSIS — R7303 Prediabetes: Secondary | ICD-10-CM | POA: Diagnosis not present

## 2024-03-31 DIAGNOSIS — M5030 Other cervical disc degeneration, unspecified cervical region: Secondary | ICD-10-CM

## 2024-03-31 MED ORDER — WEGOVY 0.5 MG/0.5ML ~~LOC~~ SOAJ: 0.5000 mg | SUBCUTANEOUS | 0 refills | Status: DC

## 2024-03-31 NOTE — Telephone Encounter (Signed)
 Started PA for [facility] 0.5mg  via covermymeds.

## 2024-03-31 NOTE — Progress Notes (Signed)
 First follow-up after initial visit.        WEIGHT SUMMARY AND BIOMETRICS  Weight Lost Since Last Visit: 0  Weight Gained Since Last Visit: 1lb   Vitals BP: (!) 136/90 Pulse Rate: 75 SpO2: 96 %   Anthropometric Measurements Height: 5' 7 (1.702 m) Weight: 232 lb (105.2 kg) BMI (Calculated): 36.33 Weight at Last Visit: 231lb Weight Lost Since Last Visit: 0 Weight Gained Since Last Visit: 1lb Starting Weight: 231lb Total Weight Loss (lbs): 0 lb (0 kg) Peak Weight: 250lb   Body Composition  Body Fat %: 32.5 % Fat Mass (lbs): 75.6 lbs Muscle Mass (lbs): 149 lbs Total Body Water  (lbs): 110.2 lbs Visceral Fat Rating : 20   Other Clinical Data Fasting: no Labs: no Today's Visit #: 2 Starting Date: 03/17/24    OBESITY Keygan is here to discuss his progress with his obesity treatment plan along with follow-up of his obesity related diagnoses.    Nutrition Plan: the Category 3 plan - 60% adherence.  Current exercise: goes to the gym  Interim History:  He is up 1 lb over the holiday.  Eating all of the food on the plan., Protein intake is as prescribed, Is not skipping meals, and Water  intake is adequate.  Initial positives regarding the dietary plan: He is doing ok with his breakfast and usually lunch, but struggling some in the evenings with hunger, mainly after his evening meal. Initial challenges regarding  the dietary plan: Snacking in the evening after his evening meal, and exceeding his calorie limit secondary to appetite  Pharmacotherapy: Lewayne is on not on any antiobesity medications Adverse side effects: none Hunger is poorly controlled.  Cravings are moderately controlled.  Assessment/Plan:   Prediabetes Last A1c was 5.9  Medication(s): none Lab Results  Component Value Date   HGBA1C 5.9 (H) 03/17/2024   HGBA1C 6.1 (H) 09/02/2023    HGBA1C 6.1 (H) 05/04/2023   HGBA1C 6.0 (H) 09/30/2022   HGBA1C 5.6 10/07/2021   Lab Results  Component Value Date   INSULIN  28.4 (H) 03/17/2024    Plan: Information sheet on  Insulin  Resistance and Prediabetes.  Will minimize all refined carbohydrates both sweets and starches.  Will work on the plan and exercise.  Consider both aerobic and resistance training.  Will keep protein, water , and fiber intake high.  Increase Polyunsaturated and Monounsaturated fats to increase satiety and encourage weight loss.  Aim for 7 to 9 hours of sleep nightly.  Start Wegovy  0.50 mg SQ weekly   Vitamin D  Insuffiency:  Vitamin D  is not at goal of 50.  Most recent vitamin D  level was 33.5. He is on vitamin D  OTC Lab Results  Component Value Date   VD25OH 33.5 03/17/2024    Plan: He will call next week with the dose he is taking and I will tell him what dose would be sufficient and or consider a prescription vitamin D .  Obstructive Sleep Apnea Asier has a diagnosis of sleep apnea. He reports that he has been prescribed a BIPAP in the past.   Plan: He is working with his primary care provider and she has referred him to Dr. Chalice for another sleep study as his last study was in 2022 and he is not getting restful sleep.  Continue to practice good sleep hygiene.  Will add in elements of an antiinflammatory diet and  add in elements of a Mediterranean diet.  Reduce intake of carbohydrates for weight loss.  If Wegovy  not covered will consider  Zepbound  for an indication of obstructive sleep apnea.   Polyphagia Maritza endorses excessive hunger.  Medication(s): none Appetite is poorly controlled. Cravings are moderately controlled.   Patient was counseled on the importance of maintaining healthy lifestyle habits, including balanced nutrition, regular physical activity, and behavioral modifications, while taking antiobesity medication.  Patient verbalized understanding that medication is an  adjunct to, not a replacement for, lifestyle changes and that the long-term success and weight maintenance depend on continued adherence to these strategies.   Plan: Medication(s): Wegovy 0.50 mg SQ weekly Will increase water , protein and fiber to help assuage hunger.  Will minimize foods that have a high glucose index/load to minimize reactive hypoglycemia.   Oddis denies personal or family history of thyroid  cancer, history of pancreatitis, or current cholelithiasis. Lino was informed of the most common side effects (nausea, constipation, diarrhea). She was given GLP-1 information sheet.  Patient informed to watch for possible symptoms, such as a lump or swelling in the neck, hoarseness, trouble swallowing, or shortness of breath. If you have any of these symptoms, tell your healthcare provide.   He has been placed on a 500 calorie deficit diet.  He has been advised to exercise at least 150 minutes per week, both cardio and resistance.     Generalized Obesity: Current BMI BMI (Calculated): 36.33   Pharmacotherapy Plan Start  Wegovy 0.50 mg SQ weekly  Olson is currently in the action stage of change. As such, his goal is to continue with weight loss efforts.  He has agreed to the Category 2 plan.  Exercise goals: All adults should avoid inactivity. Some physical activity is better than none, and adults who participate in any amount of physical activity gain some health benefits.  Behavioral modification strategies: increasing lean protein intake, no meal skipping, decrease eating out, meal planning , increase water  intake, better snacking choices, planning for success, increasing vegetables, decrease snacking , avoiding temptations, increase frequency of journaling, measure portion sizes, work on smaller portions, and mindful eating.  Elishua has agreed to follow-up with our clinic in 2 weeks.    Labs reviewed today from last visit (CMP, Lipids, HgbA1c, insulin , vitamin D , B 12, and thyroid   panel).   Objective:   VITALS: Per patient if applicable, see vitals. GENERAL: Alert and in no acute distress. CARDIOPULMONARY: No increased WOB. Speaking in clear sentences.  PSYCH: Pleasant and cooperative. Speech normal rate and rhythm. Affect is appropriate. Insight and judgement are appropriate. Attention is focused, linear, and appropriate.  NEURO: Oriented as arrived to appointment on time with no prompting.   Attestation Statements:   This was prepared with the assistance of Engineer, Civil (consulting).  Occasional wrong-word or sound-a-like substitutions may have occurred due to the inherent limitations of voice recognition software.   Clayborne Daring, DO

## 2024-03-31 NOTE — Progress Notes (Deleted)
  First follow-up after initial visit.        WEIGHT SUMMARY AND BIOMETRICS  No data recorded No data recorded  No data recorded No data recorded No data recorded No data recorded  OBESITY Brandon Riley is here to discuss his progress with his obesity treatment plan along with follow-up of his obesity related diagnoses.    Nutrition Plan: the Category 2 plan - ***% adherence.  Current exercise: {exercise types:16438}  Interim History:  *** {aabnutritionassessment:29213}  Initial positives regarding the dietary plan:  Initial challenges regarding  the dietary plan:   Pharmacotherapy: Brandon Riley is on {dwwpharmacotherapy:29109} Adverse side effects: {dwwse:29122} Hunger is {EWCONTROLASSESSMENT:24261}.  Cravings are {EWCONTROLASSESSMENT:24261}.  Assessment/Plan:   There are no diagnoses linked to this encounter.    {dwwmorbid:29108::Morbid Obesity}: Current BMI No data recorded  Pharmacotherapy Plan {dwwmed:29123}  {dwwpharmacotherapy:29109}  Brandon Riley {CHL AMB IS/IS NOT:210130109} currently in the action stage of change. As such, his goal is to {MWMwtloss#1:210800005}.  He has agreed to {dwwsldiets:29085}.  Exercise goals: {MWM EXERCISE RECS:23473}  Behavioral modification strategies: {dwwslwtlossstrategies:29088}.  Brandon Riley has agreed to follow-up with our clinic in {NUMBER 1-10:22536} weeks.   No orders of the defined types were placed in this encounter.   There are no discontinued medications.   No orders of the defined types were placed in this encounter.     Labs reviewed today from last visit (CMP, Lipids, HgbA1c, insulin , vitamin D , B 12, and thyroid  panel).   Objective:   VITALS: Per patient if applicable, see vitals. GENERAL: Alert and in no acute distress. CARDIOPULMONARY: No increased WOB. Speaking in clear sentences.  PSYCH: Pleasant and  cooperative. Speech normal rate and rhythm. Affect is appropriate. Insight and judgement are appropriate. Attention is focused, linear, and appropriate.  NEURO: Oriented as arrived to appointment on time with no prompting.   Attestation Statements:   ***(delete if time-based billing not used) Time spent on visit including the items listed below was *** minutes.  -preparing to see the patient (e.g., review of tests, history, previous notes) -obtaining and/or reviewing separately obtained history -counseling and educating the patient/family/caregiver -documenting clinical information in the electronic or other health record -ordering medications, tests, or procedures -independently interpreting results and communicating results to the patient/ family/caregiver -referring and communicating with other health care professionals  -care coordination   This was prepared with the assistance of Engineer, Civil (consulting).  Occasional wrong-word or sound-a-like substitutions may have occurred due to the inherent limitations of voice recognition software.

## 2024-03-31 NOTE — Progress Notes (Signed)
 Referral placed to [facility] on [address] for repeat epidural.

## 2024-04-01 ENCOUNTER — Encounter: Payer: Self-pay | Admitting: Bariatrics

## 2024-04-04 NOTE — Telephone Encounter (Signed)
 Wegovy  denied, message from plan:  *Drug Not Covered/Plan Exclusion - Your request for coverage was denied because your prescription benefit plan does not cover the requested medication. *Drug Not Covered/Plan Exclusion - Your request for coverage was denied because your prescription benefit plan does not cover the requested medication.

## 2024-04-20 ENCOUNTER — Ambulatory Visit: Admitting: Bariatrics

## 2024-04-20 ENCOUNTER — Encounter: Payer: Self-pay | Admitting: Bariatrics

## 2024-04-20 VITALS — BP 125/80 | HR 68 | Temp 98.0°F | Ht 67.0 in | Wt 234.0 lb

## 2024-04-20 DIAGNOSIS — E65 Localized adiposity: Secondary | ICD-10-CM

## 2024-04-20 DIAGNOSIS — E669 Obesity, unspecified: Secondary | ICD-10-CM | POA: Diagnosis not present

## 2024-04-20 DIAGNOSIS — L83 Acanthosis nigricans: Secondary | ICD-10-CM

## 2024-04-20 DIAGNOSIS — Z6836 Body mass index (BMI) 36.0-36.9, adult: Secondary | ICD-10-CM

## 2024-04-20 DIAGNOSIS — R7303 Prediabetes: Secondary | ICD-10-CM

## 2024-04-20 MED ORDER — METFORMIN HCL 500 MG PO TABS: 500.0000 mg | ORAL_TABLET | Freq: Two times a day (BID) | ORAL | 0 refills | Status: DC

## 2024-04-20 NOTE — Progress Notes (Signed)
 "                                                                                                             WEIGHT SUMMARY AND BIOMETRICS  Weight Lost Since Last Visit: 0lb  Weight Gained Since Last Visit: 2lb   Vitals Temp: 98 F (36.7 C) BP: 125/80 Pulse Rate: 68 SpO2: 94 %   Anthropometric Measurements Height: 5' 7 (1.702 m) Weight: 234 lb (106.1 kg) BMI (Calculated): 36.64 Weight at Last Visit: 232lb Weight Lost Since Last Visit: 0lb Weight Gained Since Last Visit: 2lb Starting Weight: 231lb Total Weight Loss (lbs): 0 lb (0 kg) Peak Weight: 250lb   Body Composition  Body Fat %: 32.7 % Fat Mass (lbs): 76.8 lbs Muscle Mass (lbs): 150.2 lbs Total Body Water  (lbs): 109.8 lbs Visceral Fat Rating : 20   Other Clinical Data Fasting: no Labs: no Today's Visit #: 3 Starting Date: 03/17/24    OBESITY Brandon Riley is here to discuss his progress with his obesity treatment plan along with follow-up of his obesity related diagnoses.    Nutrition Plan: the Category 3 plan - 65% adherence.  Current exercise: Work, plus gym 2 times a week for 1 hour each time.  Interim History:  He is up 2 lbs since his last visit. He is hungry in the evening.  Eating all of the food on the plan., Protein intake is as prescribed, Is exceeding snack calorie allotment, Is not skipping meals, Not journaling consistently., Water  intake is adequate., and Reports polyphagia   Pharmacotherapy: Brandon Riley is on no anti-obesity medications.  Hunger is poorly controlled.  Cravings are moderately controlled.  Assessment/Plan:   Prediabetes Last A1c was 5.9  Medication(s): none Lab Results  Component Value Date   HGBA1C 5.9 (H) 03/17/2024   HGBA1C 6.1 (H) 09/02/2023   HGBA1C 6.1 (H) 05/04/2023   HGBA1C 6.0 (H) 09/30/2022   HGBA1C 5.6 10/07/2021   Lab Results  Component Value Date   INSULIN  28.4 (H) 03/17/2024    Plan: Will minimize all refined carbohydrates both sweets and starches.   Will work on the plan and exercise.  Consider both aerobic and resistance training.  Will keep protein, water , and fiber intake high.  Increase Polyunsaturated and Monounsaturated fats to increase satiety and encourage weight loss.  Start Metformin  500 mg twice daily with meals  Will always eat when he takes Metformin .  Information sheet on Metformin  given. Will consider a GLP-1 at his next visit after his insurance changes.   Visceral Obesity.  He has a visceral fat rating of 20 per the bio-impedence scale.   Plan: The goal is a visceral fat rating of 13 or below.  Will work on the plan and increase exercise.  Will minimize all carbohydrates (sweets and starches).   Will use distraction techniques to prevent overeating.  Will keep his fiber, protein, and water  high to mitigate hunger.   Acanthosis nigricans:   History of acanthosis nigricans.    Plan:  We discussed acanthosis nigricans and the etiology.  Generalized Obesity: Current BMI BMI (Calculated): 36.64   Pharmacotherapy Plan Start  Metformin  500 mg twice daily with meals  Brandon Riley is currently in the action stage of change. As such, his goal is to continue with weight loss efforts.  He has agreed to the Category 3 plan.  Exercise goals: All adults should avoid inactivity. Some physical activity is better than none, and adults who participate in any amount of physical activity gain some health benefits.  Behavioral modification strategies: increasing lean protein intake, decreasing simple carbohydrates , no meal skipping, decrease eating out, meal planning , increase water  intake, better snacking choices, planning for success, increasing vegetables, avoiding temptations, keep healthy foods in the home, holiday eating strategies , weigh protein portions, measure portion sizes, work on smaller portions, and mindful eating.  Brandon Riley has agreed to follow-up with our clinic in 4 weeks.    Objective:   VITALS: Per patient  if applicable, see vitals. GENERAL: Alert and in no acute distress. CARDIOPULMONARY: No increased WOB. Speaking in clear sentences.  PSYCH: Pleasant and cooperative. Speech normal rate and rhythm. Affect is appropriate. Insight and judgement are appropriate. Attention is focused, linear, and appropriate.  NEURO: Oriented as arrived to appointment on time with no prompting.   Attestation Statements:   This was prepared with the assistance of Engineer, Civil (consulting).  Occasional wrong-word or sound-a-like substitutions may have occurred due to the inherent limitations of voice recognition.   Clayborne Daring, DO     "

## 2024-04-22 ENCOUNTER — Other Ambulatory Visit: Payer: Self-pay | Admitting: Urgent Care

## 2024-04-22 DIAGNOSIS — M5030 Other cervical disc degeneration, unspecified cervical region: Secondary | ICD-10-CM

## 2024-05-02 ENCOUNTER — Inpatient Hospital Stay: Admission: RE | Admit: 2024-05-02 | Source: Ambulatory Visit

## 2024-05-02 ENCOUNTER — Encounter: Payer: Self-pay | Admitting: Urgent Care

## 2024-05-04 ENCOUNTER — Telehealth: Payer: Self-pay

## 2024-05-04 NOTE — Discharge Instructions (Signed)

## 2024-05-05 ENCOUNTER — Ambulatory Visit
Admission: RE | Admit: 2024-05-05 | Discharge: 2024-05-05 | Disposition: A | Discharge status: Home / Self Care | Source: Ambulatory Visit | Attending: Urgent Care | Admitting: Urgent Care

## 2024-05-05 DIAGNOSIS — M5030 Other cervical disc degeneration, unspecified cervical region: Secondary | ICD-10-CM

## 2024-05-05 MED ORDER — IOPAMIDOL (ISOVUE-M 300) INJECTION 61%
1.0000 mL | Freq: Once | INTRAMUSCULAR | Status: AC | PRN
  Administered 2024-05-05: 1 mL via EPIDURAL

## 2024-05-05 MED ORDER — TRIAMCINOLONE ACETONIDE 40 MG/ML IJ SUSP (RADIOLOGY)
60.0000 mg | Freq: Once | INTRAMUSCULAR | Status: AC
  Administered 2024-05-05: 60 mg via EPIDURAL

## 2024-05-11 ENCOUNTER — Ambulatory Visit: Admitting: Bariatrics

## 2024-05-17 ENCOUNTER — Ambulatory Visit: Admitting: Neurology

## 2024-05-17 ENCOUNTER — Encounter: Payer: Self-pay | Admitting: Neurology

## 2024-05-17 VITALS — BP 137/79 | HR 87 | Ht 67.0 in | Wt 245.0 lb

## 2024-05-17 DIAGNOSIS — G2581 Restless legs syndrome: Secondary | ICD-10-CM | POA: Diagnosis not present

## 2024-05-17 DIAGNOSIS — G4733 Obstructive sleep apnea (adult) (pediatric): Secondary | ICD-10-CM

## 2024-05-17 DIAGNOSIS — Z9989 Dependence on other enabling machines and devices: Secondary | ICD-10-CM | POA: Diagnosis not present

## 2024-05-17 DIAGNOSIS — E88819 Insulin resistance, unspecified: Secondary | ICD-10-CM | POA: Diagnosis not present

## 2024-05-17 DIAGNOSIS — G4710 Hypersomnia, unspecified: Secondary | ICD-10-CM

## 2024-05-17 MED ORDER — MELATONIN 3 MG SL SUBL: 3.0000 mg | SUBLINGUAL_TABLET | Freq: Every evening | SUBLINGUAL | Status: AC

## 2024-05-17 NOTE — Progress Notes (Addendum)
 "  @[facility]   Provider:  Dedra Gores, MD  Primary Care Physician:  Lowella Benton [place], [place] [address] [facility] [address], [address] [place] [address]  Referring Provider: Lowella Benton [place], [place] [address], [address] [place],  [place] [address]        Chief Concern for this Consultation:   Patient presents with          HPI: I have the pleasure of meeting with Brandon Riley , on 05/17/24 , who is a 64 y.o.  male patient,  seen upon a referral by PA Lowella  for a TOC of  Sleep Medicine.  The patient's referral information asked for a TOC.   Chief concern according to patient:  I have been using PAP therapy for well over a decade , started with CPAP,  had 3 machines over the years and changed to BIPAP approximately 4 years ago.  Has a history of RLS and was  prescribed Wegovy  but it was not covered by insurance.   Dr Clayborne Daring will follow for weight and wellness.  Had extensive lab work which I reviewed here.    Brandon Riley presented with a medical history of:  Past Medical History:  Diagnosis Date   Allergy Seasonal   Grass & pollens   Arthritis Knees & areas   Back pain    BPH (benign prostatic hyperplasia)    Complication of anesthesia    epidural did not work ended up with general and now trouble w/ urinatiing needing laser enucleation of prastate   Fatty liver    GERD (gastroesophageal reflux disease)    Hydronephrosis of left kidney    Hyperlipemia    Hypertension    Hypertension    Joint pain    OSA on CPAP    Restless leg syndrome    Sleep apnea    Severe sleep apnea     has a past medical history of Allergy (Seasonal), Arthritis (Knees & areas), Back pain, BPH (benign prostatic hyperplasia), Complication of anesthesia, Fatty liver, GERD (gastroesophageal reflux disease), Hydronephrosis of left kidney, Hyperlipemia, Hypertension, Hypertension, Joint pain, OSA on CPAP, Restless leg syndrome, and Sleep apnea.   Sleep relevant medical/ surgical and symptom history: The patient reports  onset of symptoms over a time period of 10-15 years. before starting CPAP he was loudly snoring, thunderous. He was at a BMI comparable to today, he drove longer distance routes with 3 interruptions,  to take power naps.  He was overeating, oversleeping. He had an post anesthesia  ileus in the year 10-2023, urinary retention after knee replacement, foley cath . Protate surgery followed, renal calculi, and urinary leak and stented  urethra.    This patient had a previous sleep study/ studies in the year 2010(?)  at  with a resulting diagnosis of severe OSA. This patient has used the following therapies: CPAP, and now BiPAP.    Family medical history: There are  biological family members affected by Sleep apnea ( never dx but father likely had OSA), or Insomnia (/) , by excessive daytime sleepiness (/).  no siblings.     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 He lives in a private home, in a household with spouse.  The patient currently works, involves physical activity, outdoor activity, travel.  Nicotine use: /.  ETOH use: liquor- 2/ week , jack and coke  Caffeine intake in form of: Coffee (/), Soft drinks (3 a  week), Tea ( /) and seldom Energy drinks ( including those containing  taurine ).  Exercises  in form of physical work.  Hobbies :  Volunteering: member of a [employer].      Sleep habits and routines are as follows: The patient's dinner time is around 6.30- 7.30 PM.   The patient goes to bed at, or close to, 11.30 PM.  He may have fallen asleep on the couch already- The bedroom is shared with spouse  and is described as cool, quiet, and dark. The dogs sleep in the bedroom.  The patient reports that it takes 5 minutes to fall asleep, then continues to sleep for 4-5 hours  hours, uninterrupted . The preferred sleep position is variable , with support of 1 pillow, (non- adjustable bed/ plus body pillow ). The total  estimated sleep time is circa 5 hours.  He wakes always around 3.30 and sometimes the night is over. Dreams are reportedly  frequent/ and can be vivid. Dream enactment has been reported.   7 AM is the usual week- day rise time.  The dogs need to get up.  he reports not  feeling refreshed and restored in the morning, waking with symptoms such as ( rarely)  dry mouth, morning headaches, congestion- stiffness or pain, and fatigue.  No sleep paralysis has been experienced.  Naps in daytime are taken infrequently (there is a desire to nap and opportunity), lasting from 45 to 60  and have a refreshing quality.  These do not interfere with nocturnal sleep.    Review of Systems: Out of a complete 14 system review, the patient complains of only the following symptoms, and all other reviewed systems are negative.:  Hypersomnia    Insomnia - undesired early wake up time- sleep interruption, not staying asleep.   Depression/ anxiety:  Pain: Knee  - gets knee injections, pinched cervical nerve. Left arm tingling.  Headaches , AM     Snoring, Sleep fragmentation, now infrequent Nocturia   How likely are you to doze in the following situations: 0 = not likely, 1 = slight chance, 2 = moderate chance, 3 = high chance Sitting and Reading? Watching Television? Sitting inactive in a public place (theater or meeting)? As a passenger in a car for an hour without a break? Lying down in the afternoon when circumstances permit? Sitting and talking to someone? Sitting quietly after lunch without alcohol ? In a car, while stopped for a few minutes in traffic?   Total ESS =15 / 24 points.    FSS endorsed at 46/ 63 points.  GDS: 2/ 15  Social History   Socioeconomic History   Marital status: Married    Spouse name: Terri   Number of children: None adult daughter, grandchildren    Years of education: Not on file   Highest education level: Associate degree: occupational, scientist, product/process development, or vocational program   Occupational History   Not on file  Tobacco Use   Smoking status: Never    Passive exposure: Never   Smokeless tobacco: Never  Vaping Use   Vaping status: Never Used  Substance and Sexual Activity   Alcohol  use: Not Currently    Comment: 2-3x per week   Drug use: Never   Sexual activity: Yes    Partners: Female    Comment: MARRIED  Other Topics Concern   Not on file  Social History Narrative   Are you right handed or left handed?  Right Handed   Are you  currently employed ?Retired   What is your current occupation?   Do you live at home alone? No   Who lives with you? Wife and animals   What type of home do you live in: 1 story or 2 story? One story with a basement.       Social Drivers of Health   Tobacco Use: Low Risk (05/17/2024)   Patient History    Smoking Tobacco Use: Never    Smokeless Tobacco Use: Never    Passive Exposure: Never  Financial Resource Strain: Low Risk (02/16/2024)   Overall Financial Resource Strain (CARDIA)    Difficulty of Paying Living Expenses: Not hard at all  Food Insecurity: No Food Insecurity (02/16/2024)   Epic    Worried About Programme Researcher, Broadcasting/film/video in the Last Year: Never true    Ran Out of Food in the Last Year: Never true  Transportation Needs: No Transportation Needs (02/16/2024)   Epic    Lack of Transportation (Medical): No    Lack of Transportation (Non-Medical): No  Physical Activity: Sufficiently Active (02/16/2024)   Exercise Vital Sign    Days of Exercise per Week: 5 days    Minutes of Exercise per Session: 140 min  Stress: No Stress Concern Present (02/16/2024)   [employer] of Occupational Health - Occupational Stress Questionnaire    Feeling of Stress: Not at all  Social Connections: Moderately Integrated (02/16/2024)   Social Connection and Isolation Panel    Frequency of Communication with Friends and Family: Three times a week    Frequency of Social Gatherings with Friends and Family: More than three times a  week    Attends Religious Services: Patient declined    Active Member of Clubs or Organizations: Yes    Attends Banker Meetings: 1 to 4 times per year    Marital Status: Married  Depression (PHQ2-9): Low Risk (03/17/2024)   Depression (PHQ2-9)    PHQ-2 Score: 3  Alcohol  Screen: Low Risk (02/16/2024)   Alcohol  Screen    Last Alcohol  Screening Score (AUDIT): 2  Housing: Low Risk (02/16/2024)   Epic    Unable to Pay for Housing in the Last Year: No    Number of Times Moved in the Last Year: 0    Homeless in the Last Year: No  Utilities: Not At Risk (11/13/2023)   Epic    Threatened with loss of utilities: No  Health Literacy: Not on file    Family History  Problem Relation Age of Onset   Dementia Mother    Heart Problems Mother        Double bypass   Diabetes Mother    Arthritis Mother    Heart disease Mother    Hypertension Mother    Other Father        Prostate Issues   Arthritis Father     Past Medical History:  Diagnosis Date   Allergy Seasonal   Grass & pollens   Arthritis Knees & areas   Back pain    BPH (benign prostatic hyperplasia)    Complication of anesthesia    epidural did not work ended up with general and now trouble w/ urinatiing needing laser enucleation of prastate   Fatty liver    GERD (gastroesophageal reflux disease)    Hydronephrosis of left kidney    Hyperlipemia    Hypertension    Hypertension    Joint pain    OSA on CPAP    Restless leg syndrome  Sleep apnea    Severe sleep apnea    Past Surgical History:  Procedure Laterality Date   CHONDROPLASTY Left 10/28/2018   Procedure: CHONDROPLASTY;  Surgeon: Cristy Bonner DASEN, MD;  Location: [facility];  Service: Orthopedics;  Laterality: Left;   CYSTOSCOPY WITH STENT PLACEMENT Left 11/12/2023   Procedure: CYSTOSCOPY, WITH STENT INSERTION;  Surgeon: Francisca Redell BROCKS, MD;  Location: [facility];  Service: Urology;  Laterality: Left;   HOLEP-LASER ENUCLEATION OF THE  PROSTATE WITH MORCELLATION N/A 09/25/2023   Procedure: ENUCLEATION, PROSTATE, USING LASER, WITH MORCELLATION;  Surgeon: Francisca Redell BROCKS, MD;  Location: [facility];  Service: Urology;  Laterality: N/A;   IR RADIOLOGIST EVAL & MGMT  11/09/2023   IR RADIOLOGIST EVAL & MGMT  11/18/2023   JOINT REPLACEMENT  07/20/23   Left knee   KNEE ARTHROSCOPY WITH MEDIAL MENISECTOMY Left 10/28/2018   Procedure: LEFT KNEE ARTHROSCOPY WITH MEDIAL MENISECTOMY;  Surgeon: Cristy Bonner DASEN, MD;  Location: [facility];  Service: Orthopedics;  Laterality: Left;   RECONSTRUCTION OF NOSE  2019   TOTAL KNEE ARTHROPLASTY Left 07/20/2023   Procedure: ARTHROPLASTY, KNEE, TOTAL;  Surgeon: Edna Toribio LABOR, MD;  Location: [facility];  Service: Orthopedics;  Laterality: Left;   WISDOM TOOTH EXTRACTION  2012      Physical exam:   General: The patient was alert and appears not in acute distress.  Mood and affect are appropriate .  The patient's interactions are: Cooperative, makes eye contact, follows the instructions and answers questions coherently.  The patient is groomed and appropriately groomed and dressed. Head: Normocephalic, atraumatic.  Neck is supple. Mallampati: 3 plus.  The neck circumference measured 18  inches. Nasal airflow was patent ,   Overbite / Retrognathia was noted.  Dental status: biological  Cardiovascular:  Regular rate and cardiac rhythm by palpable pulse. Respiratory: no audible wheezing, no tachypnoea.   Skin:  Without evidence of ankle edema. No discoloration.  Trunk:  BMI is 38.4  The patient's posture was erect.   Neurologic exam : The patient was awake and alert, oriented to place and time.   Attention span & concentration ability appeared normal.  Speech was fluent, without dysarthria, dysphonia or aphasia, and of normal volume.     Cranial nerves:  There was no loss of smell or taste reported  Pupils are round, equal in size and briskly reactive to light.   Funduscopic exam was deferred.  Extraocular movements in vertical and horizontal planes were intact and without nystagmus. (No Diplopia reported). Visual fields by finger perimetry are intact. Hearing was intact to soft voice.    Facial sensation intact to fine touch.  Facial motor strength: Symmetric movement and tongue and uvula move midline.  Neck ROM: rotation, tilt and flexion extension were intact for age and shoulder shrug was symmetrical.    Motor exam:  Symmetric bulk, strength and ROM.   Normal tone without cog- wheeling, and symmetric grip strength.   Sensory:  Fine touch and vibration were tested by tuning fork and intact.  Proprioception tested in the upper extremities was normal.   Coordination: The patient reported no problems with button closure and no changes to penmanship.   The Finger-to-nose maneuver was intact without evidence of ataxia, dysmetria or tremor.   Gait and station: Patient could rise unassisted from a seated position, without bracing, and walked without  assistive device.  Left limp.    Deep tendon reflexes: Upper extremities did show symmetric DTRs. Lower extremity  DTRs were deferred  .     I would like to thank Dr Jolinda and [address] [place] [address] [place],  [place] [address] for allowing me to meet with Brandon Riley.      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 , He feels the BiPAP is not longer providing the same pressure it did 6 months ago- he can't sleep without  it.   Medications may contribute to hypersomnia.  Risk factors for OSA were present,  including : Body mass index is 38.37 kg/m., and he reportedly is heavier now. neck size and upper airway anatomy also place him at high risk. 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   2)  nocturnal pain from knee and neck, currently controlled. 3) snoring is controlled eben with dream ware nasal cradle medium size.  4) undesired short nights, 4 hours of sleep, 5 hours.  5) RLS , medication works but he takes 3 mg ( a lot ) and this may cause more sleepiness. Could be a contributor to Overeating .    My Plan is to proceed with:   0) advancing the sleep time by earlier bedtime. Go to bed at 10 PM and gain one hour of sleep time.  1)  split night with BiPAP after AHI of 20/h.  Patient is BiPAP dependent, please start BiPAP if he can't fall asleep without.  2) Sleep aid melatonin. 5 mg at bedtime.  3) weight loss medical program is already arranged. BMI and neck are main  risk factors.     I plan to follow up personally within the next ...months /through our NP within 4-6 months.   A total time of  45  minutes consistent of a part of face to face encounter , exam and interview,  and additional preparation time for chart review was spent .  At today's visit, we discussed treatment options, associated risk and benefits, and engage in counseling as needed including, but not limited to:  Sleep hygiene, Quality Sleep Habits, and Safety concerns for patients with daytime sleepiness who are warned to not operate machinery/ motor vehicles when drowsy. Risk factors for sleep apnea were identified:  Additionally, the following were reviewed: Past medical records, past medical and surgical history, family and social background, as well as relevant laboratory results, imaging findings, and medical notes, where applicable.  This note was generated by myself in part by using dictation software, and as a result, it may contain unintentional typos and errors.  Nevertheless, effort was made to accurately convey the pertinent aspects of the patient's visit.   Dedra Gores, MD   [facility] Sleep Board certified in Sleep Medicine by [employer] of  Sleep Medicine and Diplomate of the [employer] of Sleep Medicine (AASM) . Board certified In Neurology, Diplomat of the ABPN,  Fellow of the [employer] of Neurology.      "

## 2024-05-17 NOTE — Patient Instructions (Addendum)
 I would like to thank Dr Jolinda and Lowella Benton [place], [place] [address], [address] [place],  [place] [address] for allowing me to meet with Brandon Riley.          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 ,  Risk factors for OSA were present,  including : Body mass index is 38.37 kg/m., and he reportedly is heavier now. neck size and upper airway anatomy also place him at high risk. 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  2)  nocturnal pain from knee and neck, currently controlled. 3) snoring is controlled eben with dream ware nasal cradle medium size.  4) undesired short nights, 4 hours of sleep, 5 hours.  5) RLS , medication works but he takes 3 mg ( a lot ) and this may cause more sleepiness. Could be a contributor to Overeating . 6) oxycodone  taken prn for pain, he lalso prn drinks alcohol , but not regular.   My Plan is to proceed with:     0) advancing the sleep time by earlier bedtime. Go to bed at 10 PM and gain one hour of sleep time.  1)  split night with BiPAP after AHI of 20/h.  Check for central versus pure OSA.  Patient is BiPAP dependent, please start BiPAP if he can't fall asleep without.  2) Sleep aid melatonin. 5 mg at bedtime.  Bring all sleep meds to the lab with you.  3) weight loss medical program is already arranged. BMI and neck are main  risk factors.        I plan to follow up personally within the next ...months /through our NP within 4-6 months.    A total time of  45  minutes consistent of a part of face to face encounter , exam and interview,  and additional preparation time for chart review was spent .  At today's visit, we discussed treatment options, associated risk and benefits, and engage in counseling as needed including, but not limited to:  Sleep hygiene, Quality  Sleep Habits, and Safety concerns for patients with daytime sleepiness who are warned to not operate machinery/ motor vehicles when drowsy. Risk factors for sleep apnea were identified:  Additionally, the following were reviewed: Past medical records, past medical and surgical history, family and social background, as well as relevant laboratory results, imaging findings, and medical notes, where applicable.  This note was generated by myself in part by using dictation software, and as a result, it may contain unintentional typos and errors.  Nevertheless, effort was made to accurately convey the pertinent aspects of the patient's visit.    Dedra Gores, MD    [facility]  Insomnia Insomnia Insomnia is a sleep disorder that makes it difficult to fall asleep or stay asleep. Insomnia can cause fatigue, low energy, difficulty concentrating, mood swings, and poor performance at work or school. There are three different ways to classify insomnia: Difficulty falling asleep. Difficulty staying asleep. Waking up too early in the morning. Any type of insomnia can be long-term (chronic) or short-term (acute). Both are common. Short-term insomnia usually lasts for 3 months or less. Chronic insomnia occurs at least three times a week for longer than 3 months. What are the causes? Insomnia  may be caused by another condition, situation, or substance, such as: Having certain mental health conditions, such as anxiety and depression. Using caffeine, alcohol , tobacco, or drugs. Having gastrointestinal conditions, such as gastroesophageal reflux disease (GERD). Having certain medical conditions. These include: Asthma. Alzheimer's disease. Stroke. Chronic pain. An overactive thyroid  gland (hyperthyroidism). Other sleep disorders, such as restless legs syndrome and sleep apnea. Menopause. Sometimes, the cause of insomnia may not be known. What increases the  risk? Risk factors for insomnia include: Gender. Females are affected more often than males. Age. Insomnia is more common as people get older. Stress and certain medical and mental health conditions. Lack of exercise. Having an irregular work schedule. This may include working night shifts and traveling between different time zones. What are the signs or symptoms? If you have insomnia, the main symptom is having trouble falling asleep or having trouble staying asleep. This may lead to other symptoms, such as: Feeling tired or having low energy. Feeling nervous about going to sleep. Not feeling rested in the morning. Having trouble concentrating. Feeling irritable, anxious, or depressed. How is this diagnosed? This condition may be diagnosed based on: Your symptoms and medical history. Your health care provider may ask about: Your sleep habits. Any medical conditions you have. Your mental health. A physical exam. How is this treated? Treatment for insomnia depends on the cause. Treatment may focus on treating an underlying condition that is causing the insomnia. Treatment may also include: Medicines to help you sleep. Counseling or therapy. Lifestyle adjustments to help you sleep better. Follow these instructions at home: Eating and drinking  Limit or avoid alcohol , caffeinated beverages, and products that contain nicotine and tobacco, especially close to bedtime. These can disrupt your sleep. Do not eat a large meal or eat spicy foods right before bedtime. This can lead to digestive discomfort that can make it hard for you to sleep. Sleep habits  Keep a sleep diary to help you and your health care provider figure out what could be causing your insomnia. Write down: When you sleep. When you wake up during the night. How well you sleep and how rested you feel the next day. Any side effects of medicines you are taking. What you eat and drink. Make your bedroom a dark, comfortable  place where it is easy to fall asleep. Put up shades or blackout curtains to block light from outside. Use a white noise machine to block noise. Keep the temperature cool. Limit screen use before bedtime. This includes: Not watching TV. Not using your smartphone, tablet, or computer. Stick to a routine that includes going to bed and waking up at the same times every day and night. This can help you fall asleep faster. Consider making a quiet activity, such as reading, part of your nighttime routine. Try to avoid taking naps during the day so that you sleep better at night. Get out of bed if you are still awake after 15 minutes of trying to sleep. Keep the lights down, but try reading or doing a quiet activity. When you feel sleepy, go back to bed. General instructions Take over-the-counter and prescription medicines only as told by your health care provider. Exercise regularly as told by your health care provider. However, avoid exercising in the hours right before bedtime. Use relaxation techniques to manage stress. Ask your health care provider to suggest some techniques that may work well for you. These may include: Breathing exercises. Routines to release muscle tension. Visualizing peaceful scenes. Make sure  that you drive carefully. Do not drive if you feel very sleepy. Keep all follow-up visits. This is important. Contact a health care provider if: You are tired throughout the day. You have trouble in your daily routine due to sleepiness. You continue to have sleep problems, or your sleep problems get worse. Get help right away if: You have thoughts about hurting yourself or someone else. Get help right away if you feel like you may hurt yourself or others, or have thoughts about taking your own life. Go to your nearest emergency room or: Call 911. Call the National Suicide Prevention Lifeline at 2513300581 or 988. This is open 24 hours a day. Text the Crisis Text Line at  (910)634-6098. Summary Insomnia is a sleep disorder that makes it difficult to fall asleep or stay asleep. Insomnia can be long-term (chronic) or short-term (acute). Treatment for insomnia depends on the cause. Treatment may focus on treating an underlying condition that is causing the insomnia. Keep a sleep diary to help you and your health care provider figure out what could be causing your insomnia. This information is not intended to replace advice given to you by your health care provider. Make sure you discuss any questions you have with your health care provider. Document Revised: 03/25/2021 Document Reviewed: 03/25/2021 Elsevier Patient Education  2024 [employer]  Quality Sleep Information, Adult Quality sleep is important for your mental and physical health. It also improves your quality of life. Quality sleep means you: Are asleep for most of the time you are in bed. Fall asleep within 30 minutes. Wake up no more than once a night. Are awake for no longer than 20 minutes if you do wake up during the night. Most adults need 7-8 hours of quality sleep each night. How can poor sleep affect me? If you do not get enough quality sleep, you may have: Mood swings. Daytime sleepiness. Decreased alertness, reaction time, and concentration. Sleep disorders, such as insomnia and sleep apnea. Difficulty with: Solving problems. Coping with stress. Paying attention. These issues may affect your performance and productivity at work, school, and home. Lack of sleep may also put you at higher risk for accidents, suicide, and risky behaviors. If you do not get quality sleep, you may also be at higher risk for several health problems, including: Infections. Type 2 diabetes. Heart disease. High blood pressure. Obesity. Worsening of long-term conditions, like arthritis, kidney disease, depression, Parkinson's disease, and epilepsy. What actions can I take to get more quality sleep? Sleep  schedule and routine Stick to a sleep schedule. Go to sleep and wake up at about the same time each day. Do not try to sleep less on weekdays and make up for lost sleep on weekends. This does not work. Limit naps during the day to 30 minutes or less. Do not take naps in the late afternoon. Make time to relax before bed. Reading, listening to music, or taking a hot bath promotes quality sleep. Make your bedroom a place that promotes quality sleep. Keep your bedroom dark, quiet, and at a comfortable room temperature. Make sure your bed is comfortable. Avoid using electronic devices that give off bright blue light for 30 minutes before bedtime. Your brain perceives bright blue light as sunlight. This includes television, phones, and computers. If you are lying awake in bed for longer than 20 minutes, get up and do a relaxing activity until you feel sleepy. Lifestyle     Try to get at least 30 minutes of  exercise on most days. Do not exercise 2-3 hours before going to bed. Do not use any products that contain nicotine or tobacco. These products include cigarettes, chewing tobacco, and vaping devices, such as e-cigarettes. If you need help quitting, ask your health care provider. Do not drink caffeinated beverages for at least 8 hours before going to bed. Coffee, tea, and some sodas contain caffeine. Do not drink alcohol  or eat large meals close to bedtime. Try to get at least 30 minutes of sunlight every day. Morning sunlight is best. Medical concerns Work with your health care provider to treat medical conditions that may affect sleeping, such as: Nasal obstruction. Snoring. Sleep apnea and other sleep disorders. Talk to your health care provider if you think any of your prescription medicines may cause you to have difficulty falling or staying asleep. If you have sleep problems, talk with a sleep consultant. If you think you have a sleep disorder, talk with your health care provider about getting  evaluated by a specialist. Where to find more information Sleep Foundation: sleepfoundation.org American Academy of Sleep Medicine: aasm.org Centers for Disease Control and Prevention (CDC): tonerpromos.no Contact a health care provider if: You have trouble getting to sleep or staying asleep. You often wake up very early in the morning and cannot get back to sleep. You have daytime sleepiness. You have daytime sleep attacks of suddenly falling asleep and sudden muscle weakness (narcolepsy). You have a tingling sensation in your legs with a strong urge to move your legs (restless legs syndrome). You stop breathing briefly during sleep (sleep apnea). You think you have a sleep disorder or are taking a medicine that is affecting your quality of sleep. Summary Most adults need 7-8 hours of quality sleep each night. Getting enough quality sleep is important for your mental and physical health. Make your bedroom a place that promotes quality sleep, and avoid things that may cause you to have poor sleep, such as alcohol , caffeine, smoking, or large meals. Talk to your health care provider if you have trouble falling asleep or staying asleep. This information is not intended to replace advice given to you by your health care provider. Make sure you discuss any questions you have with your health care provider. Document Revised: 08/07/2021 Document Reviewed: 08/07/2021 Elsevier Patient Education  2024 [employer].

## 2024-05-17 NOTE — Progress Notes (Signed)
 SABRA

## 2024-05-23 ENCOUNTER — Ambulatory Visit: Admitting: Bariatrics

## 2024-05-25 ENCOUNTER — Telehealth: Payer: Self-pay | Admitting: Neurology

## 2024-05-25 NOTE — Telephone Encounter (Signed)
 MYC cxl

## 2024-05-31 ENCOUNTER — Encounter: Payer: Self-pay | Admitting: Bariatrics

## 2024-05-31 ENCOUNTER — Other Ambulatory Visit: Payer: Self-pay | Admitting: Bariatrics

## 2024-05-31 ENCOUNTER — Ambulatory Visit: Admitting: Bariatrics

## 2024-05-31 ENCOUNTER — Other Ambulatory Visit: Payer: Self-pay

## 2024-05-31 VITALS — BP 134/83 | HR 77 | Ht 67.0 in | Wt 234.0 lb

## 2024-05-31 DIAGNOSIS — Z6836 Body mass index (BMI) 36.0-36.9, adult: Secondary | ICD-10-CM

## 2024-05-31 DIAGNOSIS — R632 Polyphagia: Secondary | ICD-10-CM

## 2024-05-31 DIAGNOSIS — E669 Obesity, unspecified: Secondary | ICD-10-CM

## 2024-05-31 DIAGNOSIS — R7303 Prediabetes: Secondary | ICD-10-CM

## 2024-05-31 DIAGNOSIS — I10 Essential (primary) hypertension: Secondary | ICD-10-CM

## 2024-05-31 MED ORDER — WEGOVY 0.5 MG/0.5ML ~~LOC~~ SOAJ: 0.5000 mg | SUBCUTANEOUS | 0 refills | Status: AC

## 2024-05-31 MED ORDER — METFORMIN HCL 500 MG PO TABS: 500.0000 mg | ORAL_TABLET | Freq: Two times a day (BID) | ORAL | 0 refills | Status: AC

## 2024-05-31 MED ORDER — SEMAGLUTIDE-WEIGHT MANAGEMENT 0.25 MG/0.5ML ~~LOC~~ SOAJ: 0.2500 mg | SUBCUTANEOUS | 0 refills | Status: AC

## 2024-06-23 ENCOUNTER — Encounter

## 2024-06-28 ENCOUNTER — Ambulatory Visit: Admitting: Bariatrics

## 2024-10-12 ENCOUNTER — Ambulatory Visit: Admitting: Neurology
